# Patient Record
Sex: Male | Born: 1950 | Race: White | Hispanic: No | Marital: Married | Smoking: Never smoker
Health system: Southern US, Community
[De-identification: ages and names within clinical notes are randomized; demographics above are authoritative.]

## PROBLEM LIST (undated history)

## (undated) DIAGNOSIS — J309 Allergic rhinitis, unspecified: Secondary | ICD-10-CM

## (undated) DIAGNOSIS — F32A Depression, unspecified: Secondary | ICD-10-CM

## (undated) DIAGNOSIS — H269 Unspecified cataract: Secondary | ICD-10-CM

## (undated) DIAGNOSIS — F419 Anxiety disorder, unspecified: Secondary | ICD-10-CM

## (undated) DIAGNOSIS — E785 Hyperlipidemia, unspecified: Secondary | ICD-10-CM

## (undated) DIAGNOSIS — I10 Essential (primary) hypertension: Secondary | ICD-10-CM

## (undated) DIAGNOSIS — G709 Myoneural disorder, unspecified: Secondary | ICD-10-CM

## (undated) DIAGNOSIS — K219 Gastro-esophageal reflux disease without esophagitis: Secondary | ICD-10-CM

## (undated) DIAGNOSIS — E119 Type 2 diabetes mellitus without complications: Secondary | ICD-10-CM

## (undated) DIAGNOSIS — T7840XA Allergy, unspecified, initial encounter: Secondary | ICD-10-CM

## (undated) DIAGNOSIS — I739 Peripheral vascular disease, unspecified: Secondary | ICD-10-CM

## (undated) DIAGNOSIS — M1990 Unspecified osteoarthritis, unspecified site: Secondary | ICD-10-CM

## (undated) DIAGNOSIS — J189 Pneumonia, unspecified organism: Secondary | ICD-10-CM

## (undated) DIAGNOSIS — Z8701 Personal history of pneumonia (recurrent): Secondary | ICD-10-CM

## (undated) DIAGNOSIS — F329 Major depressive disorder, single episode, unspecified: Secondary | ICD-10-CM

## (undated) HISTORY — PX: EYE SURGERY: SHX253

## (undated) HISTORY — DX: Allergy, unspecified, initial encounter: T78.40XA

## (undated) HISTORY — PX: CATARACT EXTRACTION: SUR2

## (undated) HISTORY — DX: Depression, unspecified: F32.A

## (undated) HISTORY — DX: Essential (primary) hypertension: I10

## (undated) HISTORY — PX: KNEE SURGERY: SHX244

## (undated) HISTORY — PX: CARPAL TUNNEL RELEASE: SHX101

## (undated) HISTORY — DX: Personal history of pneumonia (recurrent): Z87.01

## (undated) HISTORY — DX: Peripheral vascular disease, unspecified: I73.9

## (undated) HISTORY — DX: Unspecified osteoarthritis, unspecified site: M19.90

## (undated) HISTORY — DX: Allergic rhinitis, unspecified: J30.9

## (undated) HISTORY — DX: Hyperlipidemia, unspecified: E78.5

## (undated) HISTORY — DX: Major depressive disorder, single episode, unspecified: F32.9

## (undated) HISTORY — DX: Gastro-esophageal reflux disease without esophagitis: K21.9

## (undated) HISTORY — DX: Type 2 diabetes mellitus without complications: E11.9

## (undated) HISTORY — DX: Anxiety disorder, unspecified: F41.9

## (undated) HISTORY — DX: Unspecified cataract: H26.9

## (undated) HISTORY — DX: Myoneural disorder, unspecified: G70.9

---

## 2005-03-13 ENCOUNTER — Encounter: Admission: RE | Admit: 2005-03-13 | Discharge: 2005-06-11 | Payer: Self-pay | Admitting: Family Medicine

## 2005-12-05 ENCOUNTER — Ambulatory Visit: Payer: Self-pay | Admitting: Internal Medicine

## 2005-12-27 ENCOUNTER — Ambulatory Visit: Payer: Self-pay | Admitting: Internal Medicine

## 2007-02-10 ENCOUNTER — Ambulatory Visit (HOSPITAL_BASED_OUTPATIENT_CLINIC_OR_DEPARTMENT_OTHER): Admission: RE | Admit: 2007-02-10 | Discharge: 2007-02-10 | Payer: Self-pay | Admitting: Orthopedic Surgery

## 2010-11-14 NOTE — Op Note (Signed)
NAME:  Daniel [name], Daniel [name]                   ACCOUNT NO.:  [id number]   MEDICAL RECORD NO.:  [id number]          PATIENT TYPE:  AMB   LOCATION:  [facility]                         FACILITY:  [facility]   PHYSICIAN:  Ollen Gross, M.D.    DATE OF BIRTH:  [birth year]   DATE OF PROCEDURE:  02/10/2007  DATE OF DISCHARGE:                               OPERATIVE REPORT   PREOPERATIVE DIAGNOSIS:  Right knee medial meniscal tear.   POSTOPERATIVE DIAGNOSIS:  Right knee medial meniscal tear.  Chondral  defect, medial femoral condyle.   OPERATION PERFORMED:  Right knee arthroscopy with meniscal debridement  and chondroplasty.   SURGEON:  Ollen Gross, M.D.   ASSISTANT:  None.   ANESTHESIA:  General.   ESTIMATED BLOOD LOSS:  Minimal.   DRAINS:  None.   COMPLICATIONS:  None.   CONDITION:  Stable to recovery.   INDICATIONS FOR PROCEDURE:  Daniel [name] is a 60 year old male who has  several month history of progressively worsening right knee pain and  mechanical symptoms.  Exam and history suggest a meniscal tear confirmed  by MRI with also associated chondral defect.  Presents now for  arthroscopy and debridement.   DESCRIPTION OF PROCEDURE:  After successful administration of general  anesthetic, a tourniquet was placed high on the right thigh.  Right  lower extremity was prepped and draped in the usual sterile fashion.  Standard superomedial inferolateral incisions were made from the portals  and the inflow cannula passed superomedial, camera passed inferolateral.  Arthroscopic visualization proceeds.  Undersurface of the patella showed  some grade 1 and 2 change as did the trochlea.  There are no full  thickness chondral defects.  Medial and lateral gutters are visualized.  There are no loose bodies.  Flexion valgus force was applied to the knee  and the medial compartment was entered.  There is evidence of chondral  defect coming off the medial femoral condyle as well as a severely  degenerative tear  in the body and posterior horn of the medial meniscus.  Spinal needle was used to localize the inferomedial portal.  A small  incision made, dilator placed and I debrided the meniscus back to a  stable base with baskets and a 4.2 mm shaver and sealed it off with the  arthrocare device.  The meniscus was then found to be stable.  About a 1  x 1 cm area of exposed bone on the medialmost aspect of the medial  femoral condyle.  There was also some high grade chondromalacia just  adjacent to this.  I debrided the unstable cartilage back to a stable  bony base with stable cartilaginous edges.  The entire defect is about 1  x 1 cm.  The rest of the condyle had some low grade chondromalacia.  There is no evidence of any exposed bone on the tibial plateau.  The  intercondylar notch was visualized, the ACL was normal.  The lateral  compartment is entered and it is normal.  We inspected the joint again,  no further tears, defects or loose bodies.  The arthroscopic equipment  was then removed from the inferior portals  which were closed with interrupted 4-0 nylon.  20 mL of 0.25% Marcaine  with epinephrine were injected through the inflow cannula and that was  removed and that was removed and that portal closed with nylon.  Bulky  sterile dressing was applied and he was awakened and transported to  recovery in stable condition.      Ollen Gross, M.D.  Electronically Signed     FA/MEDQ  D:  02/10/2007  T:  02/11/2007  Job:  [id number]

## 2010-11-24 DIAGNOSIS — R809 Proteinuria, unspecified: Secondary | ICD-10-CM | POA: Insufficient documentation

## 2011-04-16 LAB — POCT I-STAT 4, (NA,K, GLUC, HGB,HCT)
Glucose, Bld: 113 — ABNORMAL HIGH
HCT: 46
Hemoglobin: 12.9 — ABNORMAL LOW
Hemoglobin: 15.6
Operator id: 114531
Potassium: 4.3
Potassium: 4.4
Sodium: 137

## 2011-09-13 DIAGNOSIS — J45909 Unspecified asthma, uncomplicated: Secondary | ICD-10-CM | POA: Insufficient documentation

## 2012-02-29 ENCOUNTER — Encounter: Payer: Self-pay | Admitting: *Deleted

## 2012-03-21 ENCOUNTER — Ambulatory Visit: Payer: Self-pay | Admitting: Internal Medicine

## 2012-05-16 ENCOUNTER — Other Ambulatory Visit (INDEPENDENT_AMBULATORY_CARE_PROVIDER_SITE_OTHER): Payer: BC Managed Care – PPO

## 2012-05-16 ENCOUNTER — Ambulatory Visit (INDEPENDENT_AMBULATORY_CARE_PROVIDER_SITE_OTHER): Payer: BC Managed Care – PPO | Admitting: Internal Medicine

## 2012-05-16 ENCOUNTER — Encounter: Payer: Self-pay | Admitting: Internal Medicine

## 2012-05-16 VITALS — BP 120/76 | HR 76 | Ht 66.5 in | Wt 245.2 lb

## 2012-05-16 DIAGNOSIS — R195 Other fecal abnormalities: Secondary | ICD-10-CM

## 2012-05-16 DIAGNOSIS — D509 Iron deficiency anemia, unspecified: Secondary | ICD-10-CM

## 2012-05-16 LAB — CBC WITH DIFFERENTIAL/PLATELET
Basophils Relative: 0.3 % (ref 0.0–3.0)
Eosinophils Relative: 1.5 % (ref 0.0–5.0)
Lymphocytes Relative: 32.6 % (ref 12.0–46.0)
MCV: 92.8 fl (ref 78.0–100.0)
Monocytes Absolute: 1.1 10*3/uL — ABNORMAL HIGH (ref 0.1–1.0)
Monocytes Relative: 10.9 % (ref 3.0–12.0)
Neutrophils Relative %: 54.7 % (ref 43.0–77.0)
Platelets: 328 10*3/uL (ref 150.0–400.0)
RBC: 3.96 Mil/uL — ABNORMAL LOW (ref 4.22–5.81)
WBC: 10.1 10*3/uL (ref 4.5–10.5)

## 2012-05-16 LAB — IBC PANEL
Iron: 75 ug/dL (ref 42–165)
Saturation Ratios: 19.2 % — ABNORMAL LOW (ref 20.0–50.0)
Transferrin: 279.5 mg/dL (ref 212.0–360.0)

## 2012-05-16 MED ORDER — OMEPRAZOLE 20 MG PO CPDR: 20.0000 mg | DELAYED_RELEASE_CAPSULE | Freq: Every day | ORAL | Status: DC

## 2012-05-16 NOTE — Patient Instructions (Addendum)
Your physician has requested that you go to the basement for the following lab work before leaving today: CBC, IBC  We have sent the following medications to your pharmacy for you to pick up at your convenience: Prilosec 20 mg daily.  CC: Dr Herb Grays

## 2012-05-16 NOTE — Progress Notes (Signed)
Daniel [name] Jun 08, 1951 MRN [id number]  History of Present Illness:  This is a 61 year old the white male with one out of three Hemoccult cards positive for blood on  home test. He takes ibuprofen 800 mg once a day. He stopped taking his ibuprofen according to the instructions on the cards. He denies any visible blood per rectum. His last colonoscopy in June 2007 was a normal exam. He has a positive family history of colon polyps. He has occasional heartburn but denies dysphagia or odynophagia. His bowel habits are regular. His last colonoscopy was not covered by his insurance, [employer]. He is currently looking for insurance carrier which would cover his procedures and his medical care, so it may take several months for him to be able to obtain insurance.   Past Medical History  Diagnosis Date  . Diabetes mellitus   . Benign hypertension   . Arthritis   . History of pneumonia   . HLD (hyperlipidemia)   . Allergic rhinitis    Past Surgical History  Procedure Date  . Cataract extraction     bilateral  . Knee surgery     right    reports that he has never smoked. He has never used smokeless tobacco. He reports that he does not drink alcohol or use illicit drugs. family history includes Atrial fibrillation in his mother; Colon polyps in his father; Diabetes in his father; and Prostate cancer in his paternal uncle. Allergies  Allergen Reactions  . Codeine     Palpitations, SOB        Review of Systems: Negative for abdominal pain positive for joint pains negative for melena  The remainder of the 10 point ROS is negative except as outlined in H&P   Physical Exam: General appearance  Well developed, in no distress overweight. Eyes- non icteric. HEENT nontraumatic, normocephalic. Mouth no lesions, tongue papillated, no cheilosis. Neck supple without adenopathy, thyroid not enlarged, no carotid bruits, no JVD. Lungs Clear to auscultation bilaterally. Cor normal S1,  normal S2, regular rhythm, no murmur,  quiet precordium. Abdomen: Obese soft nontender with normoactive bowel sounds. No distention. Liver edge at costal margin. Rectal: Soft Hemoccult negative stool. Extremities trace edema bilaterally. Skin no lesions. Neurological alert and oriented x 3. Psychological normal mood and affect.  Assessment and Plan:  Problem #1 Hemoccult-positive stool in 1 of 3 cards. I was unable to reproduce the positive test on today's exam. He had a colonoscopy 6.5 years ago. He is taking high doses of ibuprofen which may cause gastropathy and microscopic blood loss. We will check his blood count and Iron studies today. I suggest that he has an upper endoscopy and colonoscopy. At the present time, he is looking for an insurance company to cover these procedures and his general medical care. It will be for at least 2 months before he makes a decision. In the meantime he will begin  Prilosec 20 mg daily. I asked him to cut back on his ibuprofen as much as he can. We are also checking his iron studies. We will send him a reminder letter for endoscopy and colonoscopy in January 2014.   05/16/2012 Lina Sar

## 2012-08-01 ENCOUNTER — Encounter: Payer: Self-pay | Admitting: Internal Medicine

## 2012-09-15 DIAGNOSIS — J3081 Allergic rhinitis due to animal (cat) (dog) hair and dander: Secondary | ICD-10-CM | POA: Insufficient documentation

## 2012-12-09 ENCOUNTER — Other Ambulatory Visit: Payer: Self-pay | Admitting: Internal Medicine

## 2013-02-25 ENCOUNTER — Encounter: Payer: Self-pay | Admitting: Internal Medicine

## 2014-02-10 DIAGNOSIS — Z9109 Other allergy status, other than to drugs and biological substances: Secondary | ICD-10-CM | POA: Insufficient documentation

## 2015-02-11 ENCOUNTER — Ambulatory Visit (INDEPENDENT_AMBULATORY_CARE_PROVIDER_SITE_OTHER): Payer: Medicare Other | Admitting: Family Medicine

## 2015-02-11 VITALS — BP 124/68 | HR 84 | Temp 98.2°F | Resp 14 | Ht 68.0 in | Wt 235.0 lb

## 2015-02-11 DIAGNOSIS — E119 Type 2 diabetes mellitus without complications: Secondary | ICD-10-CM | POA: Diagnosis not present

## 2015-02-11 DIAGNOSIS — L03011 Cellulitis of right finger: Secondary | ICD-10-CM

## 2015-02-11 DIAGNOSIS — M79644 Pain in right finger(s): Secondary | ICD-10-CM | POA: Diagnosis not present

## 2015-02-11 DIAGNOSIS — IMO0001 Reserved for inherently not codable concepts without codable children: Secondary | ICD-10-CM

## 2015-02-11 MED ORDER — DOXYCYCLINE HYCLATE 100 MG PO CAPS: 100.0000 mg | ORAL_CAPSULE | Freq: Two times a day (BID) | ORAL | Status: DC

## 2015-02-11 MED ORDER — GLIMEPIRIDE 4 MG PO TABS: 4.0000 mg | ORAL_TABLET | Freq: Two times a day (BID) | ORAL | Status: DC

## 2015-02-11 NOTE — Patient Instructions (Signed)
1.  SOAK FINGER TWICE DAILY IN WARM WATER WITH PEROXIDE FOR 15 MINUTES EACH TIME.  Paronychia Paronychia is an inflammatory reaction involving the folds of the skin surrounding the fingernail. This is commonly caused by an infection in the skin around a nail. The most common cause of paronychia is frequent wetting of the hands (as seen with bartenders, food servers, nurses or others who wet their hands). This makes the skin around the fingernail susceptible to infection by bacteria (germs) or fungus. Other predisposing factors are:  Aggressive manicuring.  Nail biting.  Thumb sucking. The most common cause is a staphylococcal (a type of germ) infection, or a fungal (Candida) infection. When caused by a germ, it usually comes on suddenly with redness, swelling, pus and is often painful. It may get under the nail and form an abscess (collection of pus), or form an abscess around the nail. If the nail itself is infected with a fungus, the treatment is usually prolonged and may require oral medicine for up to one year. Your caregiver will determine the length of time treatment is required. The paronychia caused by bacteria (germs) may largely be avoided by not pulling on hangnails or picking at cuticles. When the infection occurs at the tips of the finger it is called felon. When the cause of paronychia is from the herpes simplex virus (HSV) it is called herpetic whitlow. TREATMENT  When an abscess is present treatment is often incision and drainage. This means that the abscess must be cut open so the pus can get out. When this is done, the following home care instructions should be followed. HOME CARE INSTRUCTIONS   It is important to keep the affected fingers very dry. Rubber or plastic gloves over cotton gloves should be used whenever the hand must be placed in water.  Keep wound clean, dry and dressed as suggested by your caregiver between warm soaks or warm compresses.  Soak in warm water for  fifteen to twenty minutes three to four times per day for bacterial infections. Fungal infections are very difficult to treat, so often require treatment for long periods of time.  For bacterial (germ) infections take antibiotics (medicine which kill germs) as directed and finish the prescription, even if the problem appears to be solved before the medicine is gone.  Only take over-the-counter or prescription medicines for pain, discomfort, or fever as directed by your caregiver. SEEK IMMEDIATE MEDICAL CARE IF:  You have redness, swelling, or increasing pain in the wound.  You notice pus coming from the wound.  You have a fever.  You notice a bad smell coming from the wound or dressing. Document Released: 12/12/2000 Document Revised: 09/10/2011 Document Reviewed: 08/13/2008 [facility] Patient Information 2015 [place], [place]. This information is not intended to replace advice given to you by your health care provider. Make sure you discuss any questions you have with your health care provider.

## 2015-02-11 NOTE — Progress Notes (Signed)
Subjective:    Patient ID: Daniel [name], male    DOB: Jul 03, 1950, 64 y.o.   MRN: [id number]  02/11/2015  Hand Pain   HPI This 64 y.o. male presents for evaluation of R hand pain.  Does tile work; usually wears gloves but did not for two days.  Five days ago, notied painful fingers on R.  Very painful. Unable to touch anything. Had to do everything with L hand on Tuesday.  On Wednesday, 40 pound  Bucket hit R third finger; severe pain.  Fingers are swollen.  Every three or four hours it is worse.  No fever/chills/sweats.  Skin is red.  Painful ROM of R third digit.  Distal finger is extremely tender.  Tried to clean under cuticle.  Tetanus vaccine unsure.  Sugar 260 this morning.  Dr. Modena Morrow is PCP.  [facility].   Review of Systems  Constitutional: Negative for fever, chills, diaphoresis and fatigue.  Endocrine: Negative for cold intolerance, heat intolerance, polydipsia, polyphagia and polyuria.  Musculoskeletal: Positive for joint swelling and arthralgias.  Skin: Positive for color change and wound. Negative for pallor and rash.  Neurological: Negative for weakness and numbness.    Past Medical History  Diagnosis Date  . Diabetes mellitus   . Benign hypertension   . Arthritis   . History of pneumonia   . HLD (hyperlipidemia)   . Allergic rhinitis   . Allergy    Past Surgical History  Procedure Laterality Date  . Cataract extraction      bilateral  . Knee surgery      right  . Eye surgery     Allergies  Allergen Reactions  . Codeine     Palpitations, SOB   Social History   Social History  . Marital Status: Married    Spouse Name: N/A  . Number of Children: 3  . Years of Education: N/A   Occupational History  . tile Chief Strategy Officer, Company secretary    Social History Main Topics  . Smoking status: Never Smoker   . Smokeless tobacco: Never Used  . Alcohol Use: No  . Drug Use: No  . Sexual Activity: Not on file   Other Topics Concern  . Not on file    Social History Narrative        Objective:    BP 124/68 mmHg  Pulse 84  Temp(Src) 98.2 F (36.8 C) (Oral)  Resp 14  Ht 5\' 8"  (1.727 m)  Wt 235 lb (106.595 kg)  BMI 35.74 kg/m2  SpO2 98% Physical Exam  Constitutional: He is oriented to person, place, and time. He appears well-developed and well-nourished. No distress.  HENT:  Head: Normocephalic and atraumatic.  Eyes: Conjunctivae and EOM are normal. Pupils are equal, round, and reactive to light.  Neck: Normal range of motion. Neck supple. Carotid bruit is not present. No thyromegaly present.  Cardiovascular: Normal rate, regular rhythm, normal heart sounds and intact distal pulses.  Exam reveals no gallop and no friction rub.   No murmur heard. Pulmonary/Chest: Effort normal and breath sounds normal. He has no wheezes. He has no rales.  Musculoskeletal:  R THIRD FINGER: no joint swelling at DIP or PIP; +distal finger swelling;no fatpad involvement.  Lymphadenopathy:    He has no cervical adenopathy.  Neurological: He is alert and oriented to person, place, and time. No cranial nerve deficit.  Skin: Skin is warm and dry. No rash noted. He is not diaphoretic. There is erythema.  R third distal finger with  swelling along lateral nail bed with fluctuance along lateral nailbed as well; +TTP.  No fatpad involvement of R distal finger.  Painful ROM of distal third finger; no streaking.  Psychiatric: He has a normal mood and affect. His behavior is normal.  Nursing note and vitals reviewed.   PROCEDURE: VERBAL CONSENT OBTAINED; STERILE 11 BLADE ADVANCED INTO AREA OF FLUCTUANCE ALONG DISTAL R THIRD FINGER ALONG LATERAL NAILBED; MODERATE AMOUNT OF WHITE DRAINAGE EXPRESSED; WOUND CULTURE OBTAINED; GOOD HEMOSTASIS; PT TOLERATED WELL.    Assessment & Plan:   1. Pain of right middle finger   2. Paronychia of third finger, right   3. Type 2 diabetes mellitus without complication    Orders Placed This Encounter  Procedures  . Wound  culture    1. PAIN OF R THIRD FINGER: New. Secondary to paronychia.   2.  Paronychia R third finger: New. S/p I&D; wound culture obtained; rx for Doxycycline provided.  RTC for worsening redness, pain, swelling. 3. DMII: uncontrolled; monitor sugars closely with acute infection.   Meds ordered this encounter  Medications  . cetirizine (ZYRTEC) 10 MG tablet    Sig: Take 10 mg by mouth daily.  . traMADol (ULTRAM) 50 MG tablet    Sig: Take 50 mg by mouth every 6 (six) hours as needed.  Marland Kitchen DISCONTD: doxycycline (VIBRAMYCIN) 100 MG capsule    Sig: Take 1 capsule (100 mg total) by mouth 2 (two) times daily.    Dispense:  20 capsule    Refill:  0  . DISCONTD: glimepiride (AMARYL) 4 MG tablet    Sig: Take 1 tablet (4 mg total) by mouth 2 (two) times daily.    Dispense:  60 tablet    Refill:  3  . glimepiride (AMARYL) 4 MG tablet    Sig: Take 1 tablet (4 mg total) by mouth 2 (two) times daily.    Dispense:  60 tablet    Refill:  3  . doxycycline (VIBRAMYCIN) 100 MG capsule    Sig: Take 1 capsule (100 mg total) by mouth 2 (two) times daily.    Dispense:  20 capsule    Refill:  0    No Follow-up on file.   Kenae Lindquist Elayne Guerin, M.D. [facility] [address] [place], [place]  [address] [phone] phone [phone] fax

## 2015-02-12 ENCOUNTER — Telehealth: Payer: Self-pay

## 2015-02-12 MED ORDER — GLIMEPIRIDE 4 MG PO TABS
4.0000 mg | ORAL_TABLET | Freq: Two times a day (BID) | ORAL | Status: DC
Start: 2015-02-12 — End: 2015-03-16

## 2015-02-12 MED ORDER — DOXYCYCLINE HYCLATE 100 MG PO CAPS: 100.0000 mg | ORAL_CAPSULE | Freq: Two times a day (BID) | ORAL | Status: DC

## 2015-02-12 NOTE — Telephone Encounter (Signed)
rx's rensent to pharmacy.  Pt notified by Larene Beach.

## 2015-02-12 NOTE — Telephone Encounter (Signed)
Pt was seen yesterday and the rx's have not  Reached the [facility] [address] [facility]   Best number [phone]

## 2015-02-14 LAB — WOUND CULTURE
GRAM STAIN: NONE SEEN
Gram Stain: NONE SEEN

## 2015-03-16 ENCOUNTER — Other Ambulatory Visit: Payer: Self-pay

## 2015-03-16 MED ORDER — GLIMEPIRIDE 4 MG PO TABS: 4.0000 mg | ORAL_TABLET | Freq: Two times a day (BID) | ORAL | Status: DC

## 2015-04-04 ENCOUNTER — Telehealth: Payer: Self-pay

## 2015-04-04 MED ORDER — METFORMIN HCL 1000 MG PO TABS: 1000.0000 mg | ORAL_TABLET | Freq: Two times a day (BID) | ORAL | Status: DC

## 2015-04-04 NOTE — Telephone Encounter (Signed)
Pt is needing to get a refill on metformin before the first of nov when he he has an appt with dr Tamala Julian

## 2015-04-04 NOTE — Telephone Encounter (Signed)
Has glimepiride and only needs metformin. Has not run out of medication. Has appt to see Dr. Tamala Julian 05/04/15. Will send 1 month refill.

## 2015-04-04 NOTE — Telephone Encounter (Signed)
Can we send in refill? 

## 2015-05-03 ENCOUNTER — Encounter: Payer: Self-pay | Admitting: Family Medicine

## 2015-05-03 ENCOUNTER — Ambulatory Visit (INDEPENDENT_AMBULATORY_CARE_PROVIDER_SITE_OTHER): Payer: Medicare Other | Admitting: Internal Medicine

## 2015-05-03 VITALS — BP 161/78 | HR 70 | Temp 98.2°F | Resp 16 | Ht 66.5 in | Wt 239.6 lb

## 2015-05-03 DIAGNOSIS — E119 Type 2 diabetes mellitus without complications: Secondary | ICD-10-CM

## 2015-05-03 DIAGNOSIS — I10 Essential (primary) hypertension: Secondary | ICD-10-CM

## 2015-05-03 DIAGNOSIS — Z0000 Encounter for general adult medical examination without abnormal findings: Secondary | ICD-10-CM

## 2015-05-03 DIAGNOSIS — E669 Obesity, unspecified: Secondary | ICD-10-CM

## 2015-05-03 DIAGNOSIS — E1169 Type 2 diabetes mellitus with other specified complication: Secondary | ICD-10-CM

## 2015-05-03 DIAGNOSIS — E7800 Pure hypercholesterolemia, unspecified: Secondary | ICD-10-CM

## 2015-05-03 DIAGNOSIS — Z23 Encounter for immunization: Secondary | ICD-10-CM | POA: Diagnosis not present

## 2015-05-03 DIAGNOSIS — R002 Palpitations: Secondary | ICD-10-CM | POA: Diagnosis not present

## 2015-05-03 LAB — POCT URINALYSIS DIP (MANUAL ENTRY)
Blood, UA: NEGATIVE
Glucose, UA: NEGATIVE
LEUKOCYTES UA: NEGATIVE
Nitrite, UA: NEGATIVE
PH UA: 5.5
Protein Ur, POC: 30 — AB
Spec Grav, UA: >=1.03
Urobilinogen, UA: 0.2

## 2015-05-03 LAB — CBC
HCT: 34.4 % — ABNORMAL LOW (ref 39.0–52.0)
HEMOGLOBIN: 11.5 g/dL — AB (ref 13.0–17.0)
MCH: 29.9 pg (ref 26.0–34.0)
MCHC: 33.4 g/dL (ref 30.0–36.0)
MCV: 89.6 fL (ref 78.0–100.0)
MPV: 10.2 fL (ref 8.6–12.4)
Platelets: 351 10*3/uL (ref 150–400)
RBC: 3.84 MIL/uL — AB (ref 4.22–5.81)
RDW: 13.4 % (ref 11.5–15.5)
WBC: 8 10*3/uL (ref 4.0–10.5)

## 2015-05-03 LAB — LIPID PANEL
CHOL/HDL RATIO: 3.4 ratio (ref <=5.0)
CHOLESTEROL: 200 mg/dL (ref 125–200)
HDL: 58 mg/dL (ref >=40)
LDL Cholesterol: 119 mg/dL (ref <130)
TRIGLYCERIDES: 115 mg/dL (ref <150)
VLDL: 23 mg/dL (ref <30)

## 2015-05-03 LAB — TSH: TSH: 2.699 u[IU]/mL (ref 0.350–4.500)

## 2015-05-03 LAB — HEMOGLOBIN A1C
Hgb A1c MFr Bld: 9 % — ABNORMAL HIGH (ref <5.7)
Mean Plasma Glucose: 212 mg/dL — ABNORMAL HIGH (ref <117)

## 2015-05-03 LAB — COMPREHENSIVE METABOLIC PANEL
ALBUMIN: 3.9 g/dL (ref 3.6–5.1)
ALT: 23 U/L (ref 9–46)
AST: 17 U/L (ref 10–35)
Alkaline Phosphatase: 63 U/L (ref 40–115)
BUN: 18 mg/dL (ref 7–25)
CHLORIDE: 103 mmol/L (ref 98–110)
CO2: 26 mmol/L (ref 20–31)
Calcium: 9.6 mg/dL (ref 8.6–10.3)
Creat: 0.94 mg/dL (ref 0.70–1.25)
Glucose, Bld: 152 mg/dL — ABNORMAL HIGH (ref 65–99)
POTASSIUM: 4.5 mmol/L (ref 3.5–5.3)
Sodium: 140 mmol/L (ref 135–146)
TOTAL PROTEIN: 6.4 g/dL (ref 6.1–8.1)
Total Bilirubin: 0.4 mg/dL (ref 0.2–1.2)

## 2015-05-03 MED ORDER — GLIMEPIRIDE 4 MG PO TABS: 4.0000 mg | ORAL_TABLET | Freq: Two times a day (BID) | ORAL | Status: DC

## 2015-05-03 MED ORDER — METFORMIN HCL 1000 MG PO TABS: 1000.0000 mg | ORAL_TABLET | Freq: Two times a day (BID) | ORAL | Status: DC

## 2015-05-03 MED ORDER — LISINOPRIL 20 MG PO TABS: 20.0000 mg | ORAL_TABLET | Freq: Every day | ORAL | Status: DC

## 2015-05-03 MED ORDER — PRAVASTATIN SODIUM 40 MG PO TABS: 40.0000 mg | ORAL_TABLET | Freq: Every day | ORAL | Status: DC

## 2015-05-03 NOTE — Progress Notes (Signed)
Subjective:    Patient ID: Daniel [name], male    DOB: 1950/09/03, 63 y.o.   MRN: [id number]  HPI This is a pleasant 64 yo male who presents today for follow up of HTN, DM2, hypercholesterolemia.  He was previously seen by Dr. Modena Morrow for primary care. He was out of his metformin for about 45 days during the last several months.   Blood sugar 140s- fasting, mid 150s post prandial.   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 Lasted 15-20 minutes, resolved with rest. Thinks these episodes were related to being overly tired and possibly dehydration, no associated chest pain, no dizziness. Last episode several weeks ago.   Review of Systems  Constitutional: Positive for fatigue (worse over last 6 months, works 48 hours most weeks).  HENT: Positive for dental problem (needs some dental work) and sinus pressure.   Eyes: Negative.   Respiratory: Negative.   Cardiovascular: Positive for palpitations. Leg swelling: occasional.  Endocrine: Positive for polyuria.  Genitourinary: Positive for frequency (for years).  Musculoskeletal: Positive for joint swelling and arthralgias (left knee, chronic, sees Dr. Maureen Ralphs).       Knee pain   Skin: Positive for color change.  Allergic/Immunologic: Positive for environmental allergies (takes zyrtec, nasocourt).  Neurological: Positive for numbness (left hand, has gotten better).  Hematological: Negative.   Psychiatric/Behavioral: Positive for sleep disturbance.      Objective:   Physical Exam Physical Exam  Constitutional: He is oriented to person, place, and time. He appears well-developed and well-nourished. Obese.  HENT:  Head: Normocephalic and atraumatic.  Right Ear: External ear normal.    Left Ear: External ear normal.  Nose: Nose normal.  Mouth/Throat: Oropharynx is clear and moist.  Eyes: Conjunctivae are normal. Pupils are equal, round, and reactive to light.  Neck: Normal range of motion. Neck supple.  Cardiovascular: Normal rate, regular rhythm, normal heart sounds and intact distal pulses.   Pulmonary/Chest: Effort normal and breath sounds normal.  Abdominal: Soft. Bowel sounds are normal. Hernia confirmed negative in the right inguinal area and confirmed negative in the left inguinal area.  Genitourinary: Testes normal and penis normal. Circumcised.  Musculoskeletal: Normal range of motion. He exhibits no edema or tenderness.       Cervical back: Normal.       Thoracic back: Normal.       Lumbar back: Normal.  Lymphadenopathy:    He has no cervical adenopathy.       Right: No inguinal adenopathy present.       Left: No inguinal adenopathy present.  Neurological: He is alert and oriented to person, place, and time. He has normal reflexes.  Skin: Skin is warm and dry.  Psychiatric: He has a normal mood and affect. His behavior is normal. Judgment normal.  Vitals reviewed.  BP 161/78 mmHg  Pulse 70  Temp(Src) 98.2 F (36.8 C) (Oral)  Resp 16  Ht 5' 6.5" (1.689 m)  Wt 239 lb 9.6 oz (108.682 kg)  BMI 38.10 kg/m2  SpO2 97% Wt Readings from Last 3 Encounters:  05/03/15 239 lb 9.6 oz (108.682 kg)  02/11/15 235 lb (106.595 kg)  05/16/12 245 lb 4 oz (111.245 kg)  EKG- reviewed with Dr. Laney Pastor- occasional PAC,  Questionable  old anteroseptal infarct.     Assessment & Plan:  1. Annual physical exam  2. Intermittent palpitations - EKG 12-Lead - Ambulatory referral to Cardiology  3. Need for influenza vaccination - Flu Vaccine QUAD 36+ mos IM  4. Diabetes mellitus type 2 in obese (HCC) - glimepiride (AMARYL) 4 MG tablet; Take 1 tablet (4 mg total) by mouth 2 (two) times daily.  Dispense: 180 tablet; Refill: 0 - metFORMIN (GLUCOPHAGE) 1000 MG tablet; Take  1 tablet (1,000 mg total) by mouth 2 (two) times daily with a meal.  Dispense: 180 tablet; Refill: 0 - CBC - Comprehensive metabolic panel - Microalbumin, urine - Hemoglobin A1c - Ambulatory referral to Cardiology - patient has not been consistently taking meds over last several months, discussed importance of medication use, careful dietary intake and exercise. Patient has recently gotten some dietary information from a diabetes educator and is making changes, motivation appears high - if no improvement at follow up, suggest endocrine referral  5. Essential hypertension - lisinopril (PRINIVIL,ZESTRIL) 20 MG tablet; Take 1 tablet (20 mg total) by mouth daily.  Dispense: 90 tablet; Refill: 0 - POCT urinalysis dipstick - CBC - Comprehensive metabolic panel - TSH - Microalbumin, urine - Ambulatory referral to Cardiology  6. Hypercholesteremia - pravastatin (PRAVACHOL) 40 MG tablet; Take 1 tablet (40 mg total) by mouth daily.  Dispense: 90 tablet; Refill: 0 - Comprehensive metabolic panel - Lipid panel - Ambulatory referral to Cardiology   Clarene Reamer, FNP-BC  [facility]  05/03/2015 5:06 PM I have participated in the care of this patient with the Advanced Practice Provider and agree with Diagnosis and Plan as documented. Robert P. Laney Pastor, M.D.

## 2015-05-03 NOTE — Patient Instructions (Addendum)
[facility]- please call and make an appointment     Why follow it? Research shows. . Those who follow the Mediterranean diet have a reduced risk of heart disease  . The diet is associated with a reduced incidence of Parkinson's and Alzheimer's diseases . People following the diet may have longer life expectancies and lower rates of chronic diseases  . The Dietary Guidelines for Americans recommends the Mediterranean diet as an eating plan to promote health and prevent disease  What Is the Mediterranean Diet?  . Healthy eating plan based on typical foods and recipes of Mediterranean-style cooking . The diet is primarily a plant based diet; these foods should make up a majority of meals   Starches - Plant based foods should make up a majority of meals - They are an important sources of vitamins, minerals, energy, antioxidants, and fiber - Choose whole grains, foods high in fiber and minimally processed items  - Typical grain sources include wheat, oats, barley, corn, brown rice, bulgar, farro, millet, polenta, couscous  - Various types of beans include chickpeas, lentils, fava beans, black beans, white beans   Fruits  Veggies - Large quantities of antioxidant rich fruits & veggies; 6 or more servings  - Vegetables can be eaten raw or lightly drizzled with oil and cooked  - Vegetables common to the traditional Mediterranean Diet include: artichokes, arugula, beets, broccoli, brussel sprouts, cabbage, carrots, celery, collard greens, cucumbers, eggplant, kale, leeks, lemons, lettuce, mushrooms, okra, onions, peas, peppers, potatoes, pumpkin, radishes, rutabaga, shallots, spinach, sweet potatoes, turnips, zucchini - Fruits common to the Mediterranean Diet include: apples, apricots, avocados, cherries, clementines, dates, figs, grapefruits, grapes, melons, nectarines, oranges, peaches, pears, pomegranates, strawberries, tangerines  Fats - Replace butter and margarine with healthy oils, such as  olive oil, canola oil, and tahini  - Limit nuts to no more than a handful a day  - Nuts include walnuts, almonds, pecans, pistachios, pine nuts  - Limit or avoid candied, honey roasted or heavily salted nuts - Olives are central to the [employer] - can be eaten whole or used in a variety of dishes   Meats Protein - Limiting red meat: no more than a few times a month - When eating red meat: choose lean cuts and keep the portion to the size of deck of cards - Eggs: approx. 0 to 4 times a week  - Fish and lean poultry: at least 2 a week  - Healthy protein sources include, chicken, [place], lean beef, lamb - Increase intake of seafood such as tuna, salmon, trout, mackerel, shrimp, scallops - Avoid or limit high fat processed meats such as sausage and bacon  Dairy - Include moderate amounts of low fat dairy products  - Focus on healthy dairy such as fat free yogurt, skim milk, low or reduced fat cheese - Limit dairy products higher in fat such as whole or 2% milk, cheese, ice cream  Alcohol - Moderate amounts of red wine is ok  - No more than 5 oz daily for women (all ages) and men older than age 22  - No more than 10 oz of wine daily for men younger than 53  Other - Limit sweets and other desserts  - Use herbs and spices instead of salt to flavor foods  - Herbs and spices common to the traditional Mediterranean Diet include: basil, bay leaves, chives, cloves, cumin, fennel, garlic, lavender, marjoram, mint, oregano, parsley, pepper, rosemary, sage, savory, sumac, tarragon, thyme   It's not  just a diet, it's a lifestyle:  . The Mediterranean diet includes lifestyle factors typical of those in the region  . Foods, drinks and meals are best eaten with others and savored . Daily physical activity is important for overall good health . This could be strenuous exercise like running and aerobics . This could also be more leisurely activities such as walking, housework, yard-work, or taking the  stairs . Moderation is the key; a balanced and healthy diet accommodates most foods and drinks . Consider portion sizes and frequency of consumption of certain foods   Meal Ideas & Options:  . Breakfast:  o Whole wheat toast or whole wheat English muffins with peanut butter & hard boiled egg o Steel cut oats topped with apples & cinnamon and skim milk  o Fresh fruit: banana, strawberries, melon, berries, peaches  o Smoothies: strawberries, bananas, greek yogurt, peanut butter o Low fat greek yogurt with blueberries and granola  o Egg white omelet with spinach and mushrooms o Breakfast couscous: whole wheat couscous, apricots, skim milk, cranberries  . Sandwiches:  o Hummus and grilled vegetables (peppers, zucchini, squash) on whole wheat bread   o Grilled chicken on whole wheat pita with lettuce, tomatoes, cucumbers or tzatziki  o Tuna salad on whole wheat bread: tuna salad made with greek yogurt, olives, red peppers, capers, green onions o Garlic rosemary lamb pita: lamb sauted with garlic, rosemary, salt & pepper; add lettuce, cucumber, greek yogurt to pita - flavor with lemon juice and black pepper  . Seafood:  o Mediterranean grilled salmon, seasoned with garlic, basil, parsley, lemon juice and black pepper o Shrimp, lemon, and spinach whole-grain pasta salad made with low fat greek yogurt  o Seared scallops with lemon orzo  o Seared tuna steaks seasoned salt, pepper, coriander topped with tomato mixture of olives, tomatoes, olive oil, minced garlic, parsley, green onions and cappers  . Meats:  o Herbed greek chicken salad with kalamata olives, cucumber, feta  o Red bell peppers stuffed with spinach, bulgur, lean ground beef (or lentils) & topped with feta   o Kebabs: skewers of chicken, tomatoes, onions, zucchini, squash  o [place] burgers: made with red onions, mint, dill, lemon juice, feta cheese topped with roasted red peppers . Vegetarian o Cucumber salad: cucumbers, artichoke  hearts, celery, red onion, feta cheese, tossed in olive oil & lemon juice  o Hummus and whole grain pita points with a greek salad (lettuce, tomato, feta, olives, cucumbers, red onion) o Lentil soup with celery, carrots made with vegetable broth, garlic, salt and pepper  o Tabouli salad: parsley, bulgur, mint, scallions, cucumbers, tomato, radishes, lemon juice, olive oil, salt and pepper.

## 2015-05-04 LAB — MICROALBUMIN, URINE: Microalb, Ur: 15.1 mg/dL

## 2015-05-07 ENCOUNTER — Other Ambulatory Visit: Payer: Self-pay | Admitting: Family Medicine

## 2015-05-07 DIAGNOSIS — D649 Anemia, unspecified: Secondary | ICD-10-CM

## 2015-06-13 ENCOUNTER — Encounter: Payer: Self-pay | Admitting: Internal Medicine

## 2015-06-13 ENCOUNTER — Ambulatory Visit (INDEPENDENT_AMBULATORY_CARE_PROVIDER_SITE_OTHER): Payer: Medicare Other | Admitting: Internal Medicine

## 2015-06-13 VITALS — BP 126/58 | HR 73 | Ht 66.5 in | Wt 242.8 lb

## 2015-06-13 DIAGNOSIS — I10 Essential (primary) hypertension: Secondary | ICD-10-CM

## 2015-06-13 DIAGNOSIS — E785 Hyperlipidemia, unspecified: Secondary | ICD-10-CM

## 2015-06-13 NOTE — Progress Notes (Signed)
Cardiology Office Note   Date:  06/13/2015   ID:  Daniel [name], DOB 1950/09/17, MRN [id number]  PCP:  Reginia Forts, MD  Cardiologist:   Dorris Carnes, MD   F/U of CAD    History of Present Illness: Daniel [name] is a 64 y.o. male with a history of intermitt palpitations, HTN, DM and HL  Pt says through the years has   2 worst times were when glu was very low  Dehydrated Has had a few times this year not associated with glu drop   Years ago told due to not enough rest, caffeine.   This year has been difficult work wise.  Family stress Had a couple spells of heart racing  Last usually 2 to 27min  One time with low glucose (30) was dizzy     Breathing is OK  No CP    Dx with DM in 2006 Pravastatin 8 year     Does demolition work  Reframes bathrooms.     Current Outpatient Prescriptions  Medication Sig Dispense Refill  . albuterol (PROVENTIL HFA;VENTOLIN HFA) 108 (90 BASE) MCG/ACT inhaler Inhale 2 puffs into the lungs every 6 (six) hours as needed.    Marland Kitchen aspirin 81 MG tablet Take 81 mg by mouth daily.    . cetirizine (ZYRTEC) 10 MG tablet Take 10 mg by mouth daily.    Marland Kitchen glimepiride (AMARYL) 4 MG tablet Take 1 tablet (4 mg total) by mouth 2 (two) times daily. 180 tablet 0  . ibuprofen (ADVIL,MOTRIN) 200 MG tablet Take 800 mg by mouth every morning.    Marland Kitchen lisinopril (PRINIVIL,ZESTRIL) 20 MG tablet Take 1 tablet (20 mg total) by mouth daily. 90 tablet 0  . loperamide (IMODIUM A-D) 2 MG tablet Take 2 mg by mouth as needed.    . metFORMIN (GLUCOPHAGE) 1000 MG tablet Take 1 tablet (1,000 mg total) by mouth 2 (two) times daily with a meal. 180 tablet 0  . omeprazole (PRILOSEC) 20 MG capsule TAKE 1 CAPSULE (20 MG TOTAL) BY MOUTH DAILY. 30 capsule 5  . pravastatin (PRAVACHOL) 40 MG tablet Take 1 tablet (40 mg total) by mouth daily. 90 tablet 0  . traMADol (ULTRAM) 50 MG tablet Take 50 mg by mouth every 6 (six) hours as needed.     No current facility-administered medications for this  visit.    Allergies:   Actos and Codeine   Past Medical History  Diagnosis Date  . Diabetes mellitus ([place])   . Benign hypertension   . Arthritis   . History of pneumonia   . HLD (hyperlipidemia)   . Allergic rhinitis   . Allergy     Past Surgical History  Procedure Laterality Date  . Cataract extraction      bilateral  . Knee surgery      right  . Eye surgery       Social History:  The patient  reports that he has never smoked. He has never used smokeless tobacco. He reports that he does not drink alcohol or use illicit drugs.   Family History:  The patient's family history includes Atrial fibrillation in his mother; Colon polyps in his father; Diabetes in his father; Prostate cancer in his paternal uncle.    ROS:  Please see the history of present illness. All other systems are reviewed and  Negative to the above problem except as noted.    PHYSICAL EXAM: VS:  BP 126/58 mmHg  Pulse 73  Ht 5' 6.5" (1.689  m)  Wt 110.133 kg (242 lb 12.8 oz)  BMI 38.61 kg/m2  SpO2 98%  GEN: Well nourished, well developed, in no acute distress HEENT: normal Neck: no JVD, carotid bruits, or masses Cardiac: RRR; no murmurs, rubs, or gallops,no edema  Respiratory:  clear to auscultation bilaterally, normal work of breathing GI: soft, nontender, nondistended, + BS  No hepatomegaly  MS: no deformity Moving all extremities   Skin: warm and dry, no rash Neuro:  Strength and sensation are intact Psych: euthymic mood, full affect   EKG:  EKG is ordered today.  NSR 62  bpm   Lipid Panel    Component Value Date/Time   CHOL 200 05/03/2015 0947   TRIG 115 05/03/2015 0947   HDL 58 05/03/2015 0947   CHOLHDL 3.4 05/03/2015 0947   VLDL 23 05/03/2015 0947   LDLCALC 119 05/03/2015 0947      Wt Readings from Last 3 Encounters:  06/13/15 110.133 kg (242 lb 12.8 oz)  05/03/15 108.682 kg (239 lb 9.6 oz)  02/11/15 106.595 kg (235 lb)      ASSESSMENT AND PLAN:  1  Palpitations   Infrequent    2x per year now.  no real dizziness REcomm  Follow  Hydrate.  Watch caffeine   2.  HL  Last lipids LDL was 119  Will get labs in a couples months  Had not been on pravastatin for a while prior  Back on  Due to get labs  Will make sure they come back to me  Goal of LDL less than 100  F/U in 1 year   Signed, Dorris Carnes, MD  06/13/2015 11:23 AM    [address], [place],   [address] Phone: [phone]; Fax: [phone]

## 2015-07-25 ENCOUNTER — Ambulatory Visit (INDEPENDENT_AMBULATORY_CARE_PROVIDER_SITE_OTHER): Payer: Medicare Other | Admitting: Family Medicine

## 2015-07-25 ENCOUNTER — Encounter: Payer: Self-pay | Admitting: Family Medicine

## 2015-07-25 VITALS — BP 135/77 | HR 71 | Temp 97.8°F | Resp 16 | Ht 66.75 in | Wt 240.8 lb

## 2015-07-25 DIAGNOSIS — E669 Obesity, unspecified: Secondary | ICD-10-CM

## 2015-07-25 DIAGNOSIS — E1169 Type 2 diabetes mellitus with other specified complication: Secondary | ICD-10-CM

## 2015-07-25 DIAGNOSIS — I10 Essential (primary) hypertension: Secondary | ICD-10-CM | POA: Diagnosis not present

## 2015-07-25 DIAGNOSIS — E785 Hyperlipidemia, unspecified: Secondary | ICD-10-CM | POA: Diagnosis not present

## 2015-07-25 DIAGNOSIS — E1142 Type 2 diabetes mellitus with diabetic polyneuropathy: Secondary | ICD-10-CM | POA: Insufficient documentation

## 2015-07-25 DIAGNOSIS — E7800 Pure hypercholesterolemia, unspecified: Secondary | ICD-10-CM

## 2015-07-25 DIAGNOSIS — R350 Frequency of micturition: Secondary | ICD-10-CM | POA: Diagnosis not present

## 2015-07-25 DIAGNOSIS — M1712 Unilateral primary osteoarthritis, left knee: Secondary | ICD-10-CM | POA: Diagnosis not present

## 2015-07-25 DIAGNOSIS — Z1159 Encounter for screening for other viral diseases: Secondary | ICD-10-CM | POA: Diagnosis not present

## 2015-07-25 DIAGNOSIS — E119 Type 2 diabetes mellitus without complications: Secondary | ICD-10-CM

## 2015-07-25 DIAGNOSIS — D649 Anemia, unspecified: Secondary | ICD-10-CM

## 2015-07-25 LAB — HEPATITIS C ANTIBODY: HCV Ab: NEGATIVE

## 2015-07-25 LAB — GLUCOSE, POCT (MANUAL RESULT ENTRY): POC Glucose: 165 mg/dl — AB (ref 70–99)

## 2015-07-25 LAB — CBC WITH DIFFERENTIAL/PLATELET
Basophils Absolute: 0 10*3/uL (ref 0.0–0.1)
Basophils Relative: 0 % (ref 0–1)
EOS ABS: 0.2 10*3/uL (ref 0.0–0.7)
EOS PCT: 2 % (ref 0–5)
HEMATOCRIT: 38.6 % — AB (ref 39.0–52.0)
Hemoglobin: 13.4 g/dL (ref 13.0–17.0)
LYMPHS ABS: 3.2 10*3/uL (ref 0.7–4.0)
Lymphocytes Relative: 31 % (ref 12–46)
MCH: 31.1 pg (ref 26.0–34.0)
MCHC: 34.7 g/dL (ref 30.0–36.0)
MCV: 89.6 fL (ref 78.0–100.0)
MONOS PCT: 8 % (ref 3–12)
MPV: 10.1 fL (ref 8.6–12.4)
Monocytes Absolute: 0.8 10*3/uL (ref 0.1–1.0)
Neutro Abs: 6.1 10*3/uL (ref 1.7–7.7)
Neutrophils Relative %: 59 % (ref 43–77)
PLATELETS: 351 10*3/uL (ref 150–400)
RBC: 4.31 MIL/uL (ref 4.22–5.81)
RDW: 12.9 % (ref 11.5–15.5)
WBC: 10.3 10*3/uL (ref 4.0–10.5)

## 2015-07-25 LAB — COMPREHENSIVE METABOLIC PANEL
ALT: 31 U/L (ref 9–46)
AST: 22 U/L (ref 10–35)
Albumin: 4.3 g/dL (ref 3.6–5.1)
Alkaline Phosphatase: 68 U/L (ref 40–115)
BUN: 24 mg/dL (ref 7–25)
CALCIUM: 9.5 mg/dL (ref 8.6–10.3)
CHLORIDE: 98 mmol/L (ref 98–110)
CO2: 26 mmol/L (ref 20–31)
Creat: 0.95 mg/dL (ref 0.70–1.25)
GLUCOSE: 159 mg/dL — AB (ref 65–99)
POTASSIUM: 4.4 mmol/L (ref 3.5–5.3)
Sodium: 138 mmol/L (ref 135–146)
Total Bilirubin: 0.4 mg/dL (ref 0.2–1.2)
Total Protein: 6.7 g/dL (ref 6.1–8.1)

## 2015-07-25 LAB — VITAMIN B12: VITAMIN B 12: 593 pg/mL (ref 211–911)

## 2015-07-25 LAB — LIPID PANEL
CHOL/HDL RATIO: 2.6 ratio (ref <=5.0)
CHOLESTEROL: 157 mg/dL (ref 125–200)
HDL: 60 mg/dL (ref >=40)
LDL Cholesterol: 70 mg/dL (ref <130)
Triglycerides: 137 mg/dL (ref <150)
VLDL: 27 mg/dL (ref <30)

## 2015-07-25 LAB — IBC PANEL
%SAT: 26 % (ref 15–60)
TIBC: 358 ug/dL (ref 250–425)
UIBC: 266 ug/dL (ref 125–400)

## 2015-07-25 LAB — POCT GLYCOSYLATED HEMOGLOBIN (HGB A1C): HEMOGLOBIN A1C: 9.5

## 2015-07-25 LAB — PSA: PSA: 0.21 ng/mL (ref <=4.00)

## 2015-07-25 LAB — IRON: Iron: 92 ug/dL (ref 50–180)

## 2015-07-25 MED ORDER — METFORMIN HCL 1000 MG PO TABS: 1000.0000 mg | ORAL_TABLET | Freq: Two times a day (BID) | ORAL | Status: DC

## 2015-07-25 MED ORDER — GLUCOSE BLOOD VI STRP: ORAL_STRIP | Status: DC

## 2015-07-25 MED ORDER — OMEPRAZOLE 20 MG PO CPDR: 20.0000 mg | DELAYED_RELEASE_CAPSULE | Freq: Every day | ORAL | Status: DC

## 2015-07-25 MED ORDER — LISINOPRIL 20 MG PO TABS: 20.0000 mg | ORAL_TABLET | Freq: Every day | ORAL | Status: DC

## 2015-07-25 MED ORDER — PRAVASTATIN SODIUM 40 MG PO TABS
40.0000 mg | ORAL_TABLET | Freq: Every day | ORAL | Status: DC
Start: 2015-07-25 — End: 2016-09-13

## 2015-07-25 MED ORDER — GLIMEPIRIDE 4 MG PO TABS: 4.0000 mg | ORAL_TABLET | Freq: Two times a day (BID) | ORAL | Status: DC

## 2015-07-25 MED ORDER — GABAPENTIN 300 MG PO CAPS: 300.0000 mg | ORAL_CAPSULE | Freq: Every day | ORAL | Status: DC

## 2015-07-25 MED ORDER — TRAMADOL HCL 50 MG PO TABS: 50.0000 mg | ORAL_TABLET | Freq: Four times a day (QID) | ORAL | Status: DC | PRN

## 2015-07-25 MED ORDER — SITAGLIPTIN PHOSPHATE 100 MG PO TABS
100.0000 mg | ORAL_TABLET | Freq: Every day | ORAL | Status: DC
Start: 2015-07-25 — End: 2016-02-28

## 2015-07-25 MED ORDER — ZOSTER VACCINE LIVE 19400 UNT/0.65ML ~~LOC~~ SOLR: 0.6500 mL | Freq: Once | SUBCUTANEOUS | Status: DC

## 2015-07-25 NOTE — Progress Notes (Signed)
Subjective:    Patient ID: Daniel [name], male    DOB: 07/31/1950, 65 y.o.   MRN: [id number]  07/25/2015  Follow-up   HPI This 65 y.o. male presents for three month follow-up:  1. Lower back pain: twisted back one week ago.  R sided; no radiation; no n/t/w.    2.  DMII: +tingling and burning in B toes distally long standing issue for five years.  Diagnosed with DMII ten years ago.  Last HgbA1c 9.0 three months ago.  At last visit, no medications for 7 weeks; now has been taking these medications.   Pneumovax 2014 TDAP unsure. Zostavax never. Home sugars running up due to decreased activity level; 175-180.  Wake up is always higher.   Avandamet worked great; allergic to Actos.  3.  L knee pain: twisted knee; s/p steroid injection by [facility]. Less active for five weeks.   4.  Urinary frequency: must urinate every 10-15 minutes.  Nocturia x 2.  During the day, has cup of decaf coffee every morning; drinks 3 bottles of water per day; one tea and one soda per day. If increases to 5 bottles of water per day, urinates all day.  Day of event, trying to get a job completed and returned back to work; heart has been taking off.  Counseling with two couples; very frustrating; not willing to do anything; stress related.    5. Palpitations: referred to Dr. Harrington Challenger.  No evidence of atrial fibrillation; anxiety related palpitations.  No recurrence of palpitations.  6.  Anemia:  No history of anemia.  Last colonoscopy 2007; Dr. Olevia Perches just retired.    Last CPE 05/03/2015  Review of Systems  Constitutional: Negative for fever, chills, diaphoresis, activity change, appetite change and fatigue.  Respiratory: Negative for cough and shortness of breath.   Cardiovascular: Negative for chest pain, palpitations and leg swelling.  Gastrointestinal: Negative for nausea, vomiting, abdominal pain and diarrhea.  Endocrine: Negative for cold intolerance, heat intolerance, polydipsia, polyphagia and polyuria.  Skin:  Negative for color change, rash and wound.  Neurological: Negative for dizziness, tremors, seizures, syncope, facial asymmetry, speech difficulty, weakness, light-headedness, numbness and headaches.  Psychiatric/Behavioral: Negative for sleep disturbance and dysphoric mood. The patient is not nervous/anxious.     Past Medical History  Diagnosis Date  . Diabetes mellitus ([place])   . Benign hypertension   . Arthritis   . History of pneumonia   . HLD (hyperlipidemia)   . Allergic rhinitis   . Allergy    Past Surgical History  Procedure Laterality Date  . Cataract extraction      bilateral  . Knee surgery      right  . Eye surgery     Allergies  Allergen Reactions  . Actos [Pioglitazone] Other (See Comments), Shortness Of Breath and Swelling    Blood sugar went up and down and sweating profusely  . Codeine Nausea Only and Palpitations    Palpitations, SOB   Current Outpatient Prescriptions  Medication Sig Dispense Refill  . albuterol (PROVENTIL HFA;VENTOLIN HFA) 108 (90 BASE) MCG/ACT inhaler Inhale 2 puffs into the lungs every 6 (six) hours as needed.    Marland Kitchen aspirin 81 MG tablet Take 81 mg by mouth daily.    . cetirizine (ZYRTEC) 10 MG tablet Take 10 mg by mouth daily.    Marland Kitchen glimepiride (AMARYL) 4 MG tablet Take 1 tablet (4 mg total) by mouth 2 (two) times daily. 180 tablet 3  . ibuprofen (ADVIL,MOTRIN) 200 MG tablet  Take 800 mg by mouth every morning.    Marland Kitchen lisinopril (PRINIVIL,ZESTRIL) 20 MG tablet Take 1 tablet (20 mg total) by mouth daily. 90 tablet 3  . loperamide (IMODIUM A-D) 2 MG tablet Take 2 mg by mouth as needed.    . metFORMIN (GLUCOPHAGE) 1000 MG tablet Take 1 tablet (1,000 mg total) by mouth 2 (two) times daily with a meal. 180 tablet 3  . omeprazole (PRILOSEC) 20 MG capsule Take 1 capsule (20 mg total) by mouth daily. 90 capsule 3  . pravastatin (PRAVACHOL) 40 MG tablet Take 1 tablet (40 mg total) by mouth daily. 90 tablet 3  . gabapentin (NEURONTIN) 300 MG capsule Take  1 capsule (300 mg total) by mouth at bedtime. 90 capsule 3  . glucose blood (BL TEST STRIP PACK) test strip Use as instructed 100 each 12  . sitaGLIPtin (JANUVIA) 100 MG tablet Take 1 tablet (100 mg total) by mouth daily. 90 tablet 3  . traMADol (ULTRAM) 50 MG tablet Take 1 tablet (50 mg total) by mouth every 6 (six) hours as needed. 120 tablet 5  . zoster vaccine live, PF, (ZOSTAVAX) 16109 UNT/0.65ML injection Inject 19,400 Units into the skin once. 1 each 0   No current facility-administered medications for this visit.   Social History   Social History  . Marital Status: Married    Spouse Name: N/A  . Number of Children: 3  . Years of Education: N/A   Occupational History  . tile Chief Strategy Officer, Company secretary    Social History Main Topics  . Smoking status: Never Smoker   . Smokeless tobacco: Never Used  . Alcohol Use: No  . Drug Use: No  . Sexual Activity: Not on file   Other Topics Concern  . Not on file   Social History Narrative   Family History  Problem Relation Age of Onset  . Diabetes Father   . Colon polyps Father   . Prostate cancer Paternal Uncle   . Atrial fibrillation Mother        Objective:    BP 135/77 mmHg  Pulse 71  Temp(Src) 97.8 F (36.6 C) (Oral)  Resp 16  Ht 5' 6.75" (1.695 m)  Wt 240 lb 12.8 oz (109.226 kg)  BMI 38.02 kg/m2  SpO2 96% Physical Exam  Constitutional: He is oriented to person, place, and time. He appears well-developed and well-nourished. No distress.  HENT:  Head: Normocephalic and atraumatic.  Right Ear: External ear normal.  Left Ear: External ear normal.  Nose: Nose normal.  Mouth/Throat: Oropharynx is clear and moist.  Eyes: Conjunctivae and EOM are normal. Pupils are equal, round, and reactive to light.  Neck: Normal range of motion. Neck supple. Carotid bruit is not present. No thyromegaly present.  Cardiovascular: Normal rate, regular rhythm, normal heart sounds and intact distal pulses.  Exam reveals no gallop and no  friction rub.   No murmur heard. Pulmonary/Chest: Effort normal and breath sounds normal. He has no wheezes. He has no rales.  Abdominal: Soft. Bowel sounds are normal. He exhibits no distension and no mass. There is no tenderness. There is no rebound and no guarding.  Lymphadenopathy:    He has no cervical adenopathy.  Neurological: He is alert and oriented to person, place, and time. No cranial nerve deficit.  Skin: Skin is warm and dry. No rash noted. He is not diaphoretic.  Psychiatric: He has a normal mood and affect. His behavior is normal.  Nursing note and vitals reviewed.  Results for  orders placed or performed in visit on 07/25/15  POCT glycosylated hemoglobin (Hb A1C)  Result Value Ref Range   Hemoglobin A1C 9.5   POCT glucose (manual entry)  Result Value Ref Range   POC Glucose 165 (A) 70 - 99 mg/dl       Assessment & Plan:   1. Type 2 diabetes mellitus with diabetic polyneuropathy, without long-term current use of insulin ([place])   2. Anemia, unspecified anemia type   3. Need for hepatitis C screening test   4. Urinary frequency   5. Hyperlipidemia   6. Hypercholesteremia   7. Diabetes mellitus type 2 in obese ([place])   8. Essential hypertension   9. Primary osteoarthritis of left knee     Orders Placed This Encounter  Procedures  . CBC with Differential/Platelet  . Comprehensive metabolic panel  . Iron  . IBC panel  . Vitamin B12  . Hepatitis C antibody  . PSA  . Lipid panel    Order Specific Question:  Has the patient fasted?    Answer:  Yes  . POCT glycosylated hemoglobin (Hb A1C)  . POCT glucose (manual entry)  . HM Diabetes Foot Exam   Meds ordered this encounter  Medications  . zoster vaccine live, PF, (ZOSTAVAX) 16109 UNT/0.65ML injection    Sig: Inject 19,400 Units into the skin once.    Dispense:  1 each    Refill:  0  . traMADol (ULTRAM) 50 MG tablet    Sig: Take 1 tablet (50 mg total) by mouth every 6 (six) hours as needed.    Dispense:   120 tablet    Refill:  5  . pravastatin (PRAVACHOL) 40 MG tablet    Sig: Take 1 tablet (40 mg total) by mouth daily.    Dispense:  90 tablet    Refill:  3  . omeprazole (PRILOSEC) 20 MG capsule    Sig: Take 1 capsule (20 mg total) by mouth daily.    Dispense:  90 capsule    Refill:  3  . metFORMIN (GLUCOPHAGE) 1000 MG tablet    Sig: Take 1 tablet (1,000 mg total) by mouth 2 (two) times daily with a meal.    Dispense:  180 tablet    Refill:  3  . lisinopril (PRINIVIL,ZESTRIL) 20 MG tablet    Sig: Take 1 tablet (20 mg total) by mouth daily.    Dispense:  90 tablet    Refill:  3  . glimepiride (AMARYL) 4 MG tablet    Sig: Take 1 tablet (4 mg total) by mouth 2 (two) times daily.    Dispense:  180 tablet    Refill:  3  . gabapentin (NEURONTIN) 300 MG capsule    Sig: Take 1 capsule (300 mg total) by mouth at bedtime.    Dispense:  90 capsule    Refill:  3  . glucose blood (BL TEST STRIP PACK) test strip    Sig: Use as instructed    Dispense:  100 each    Refill:  12    [facility] meter check sugars once daily  . sitaGLIPtin (JANUVIA) 100 MG tablet    Sig: Take 1 tablet (100 mg total) by mouth daily.    Dispense:  90 tablet    Refill:  3    Return in about 3 months (around 10/23/2015) for recheck diabetes.    Briseis Aguilera Elayne Guerin, M.D. [facility] [address] [place], [place]  [address] [phone]  [phone] phone [phone] fax

## 2015-07-25 NOTE — Patient Instructions (Signed)

## 2015-07-28 ENCOUNTER — Other Ambulatory Visit: Payer: Self-pay

## 2015-07-28 MED ORDER — GLUCOSE BLOOD VI STRP: ORAL_STRIP | Status: DC

## 2015-08-08 ENCOUNTER — Encounter: Payer: Self-pay | Admitting: Family Medicine

## 2015-09-03 ENCOUNTER — Telehealth: Payer: Self-pay

## 2015-09-03 NOTE — Telephone Encounter (Signed)
[facility] on [address] is calling because Dr. Thompson Caul DEA number is expired. They want to know if it's been updated

## 2015-10-18 ENCOUNTER — Telehealth: Payer: Self-pay

## 2015-10-18 NOTE — Telephone Encounter (Signed)
He will need an OV because he does not have asthma on his problem list or any need for inhaler and we have not seen him recently for any breathing issues.

## 2015-10-18 NOTE — Telephone Encounter (Signed)
Pt advised.

## 2015-10-18 NOTE — Telephone Encounter (Signed)
Pt is needing a refill on his albuteral inhaler,states he had not needed it when he was in last but does now   Best number [phone]

## 2015-10-18 NOTE — Telephone Encounter (Signed)
Can we Rx, I dont see that we have prescribed this in a while.

## 2015-10-19 ENCOUNTER — Ambulatory Visit (INDEPENDENT_AMBULATORY_CARE_PROVIDER_SITE_OTHER): Payer: Medicare Other | Admitting: Family Medicine

## 2015-10-19 VITALS — BP 159/76 | HR 91 | Temp 98.0°F | Resp 17 | Ht 66.5 in | Wt 246.0 lb

## 2015-10-19 DIAGNOSIS — J4521 Mild intermittent asthma with (acute) exacerbation: Secondary | ICD-10-CM | POA: Diagnosis not present

## 2015-10-19 DIAGNOSIS — J3089 Other allergic rhinitis: Secondary | ICD-10-CM

## 2015-10-19 DIAGNOSIS — E1142 Type 2 diabetes mellitus with diabetic polyneuropathy: Secondary | ICD-10-CM

## 2015-10-19 MED ORDER — ALBUTEROL SULFATE HFA 108 (90 BASE) MCG/ACT IN AERS: 2.0000 | INHALATION_SPRAY | RESPIRATORY_TRACT | Status: DC | PRN

## 2015-10-19 MED ORDER — ALBUTEROL SULFATE (2.5 MG/3ML) 0.083% IN NEBU
2.5000 mg | INHALATION_SOLUTION | Freq: Once | RESPIRATORY_TRACT | Status: AC
  Administered 2015-10-19: 2.5 mg via RESPIRATORY_TRACT

## 2015-10-19 MED ORDER — MONTELUKAST SODIUM 10 MG PO TABS: 10.0000 mg | ORAL_TABLET | Freq: Every day | ORAL | Status: DC

## 2015-10-19 MED ORDER — MOMETASONE FUROATE 50 MCG/ACT NA SUSP: 2.0000 | Freq: Every day | NASAL | Status: DC

## 2015-10-19 MED ORDER — BUDESONIDE-FORMOTEROL FUMARATE 80-4.5 MCG/ACT IN AERO: 2.0000 | INHALATION_SPRAY | Freq: Two times a day (BID) | RESPIRATORY_TRACT | Status: DC

## 2015-10-19 NOTE — Patient Instructions (Signed)
Hay Fever Hay fever is an allergic reaction to particles in the air. It cannot be passed from person to person. It cannot be cured, but it can be controlled. CAUSES  Hay fever is caused by something that triggers an allergic reaction (allergens). The following are examples of allergens:  Ragweed.  Feathers.  Animal dander.  Grass and tree pollens.  Cigarette smoke.  House dust.  Pollution. SYMPTOMS   Sneezing.  Runny or stuffy nose.  Tearing eyes.  Itchy eyes, nose, mouth, throat, skin, or other area.  Sore throat.  Headache.  Decreased sense of smell or taste. DIAGNOSIS Your caregiver will perform a physical exam and ask questions about the symptoms you are having.Allergy testing may be done to determine exactly what triggers your hay fever.  TREATMENT   Over-the-counter medicines may help symptoms. These include:  Antihistamines.  Decongestants. These may help with nasal congestion.  Your caregiver may prescribe medicines if over-the-counter medicines do not work.  Some people benefit from allergy shots when other medicines are not helpful. HOME CARE INSTRUCTIONS   Avoid the allergen that is causing your symptoms, if possible.  Take all medicine as told by your caregiver. SEEK MEDICAL CARE IF:   You have severe allergy symptoms and your current medicines are not helping.  Your treatment was working at one time, but you are now experiencing symptoms.  You have sinus congestion and pressure.  You develop a fever or headache.  You have thick nasal discharge.  You have asthma and have a worsening cough and wheezing. SEEK IMMEDIATE MEDICAL CARE IF:   You have swelling of your tongue or lips.  You have trouble breathing.  You feel lightheaded or like you are going to faint.  You have cold sweats.  You have a fever.   This information is not intended to replace advice given to you by your health care provider. Make sure you discuss any  questions you have with your health care provider.   Document Released: 06/18/2005 Document Revised: 09/10/2011 Document Reviewed: 12/29/2014 Elsevier Interactive Patient Education 2016 [place].  Asthma, Acute Bronchospasm Acute bronchospasm caused by asthma is also referred to as an asthma attack. Bronchospasm means your air passages become narrowed. The narrowing is caused by inflammation and tightening of the muscles in the air tubes (bronchi) in your lungs. This can make it hard to breathe or cause you to wheeze and cough. CAUSES Possible triggers are:  Animal dander from the skin, hair, or feathers of animals.  Dust mites contained in house dust.  Cockroaches.  Pollen from trees or grass.  Mold.  Cigarette or tobacco smoke.  Air pollutants such as dust, household cleaners, hair sprays, aerosol sprays, paint fumes, strong chemicals, or strong odors.  Cold air or weather changes. Cold air may trigger inflammation. Winds increase molds and pollens in the air.  Strong emotions such as crying or laughing hard.  Stress.  Certain medicines such as aspirin or beta-blockers.  Sulfites in foods and drinks, such as dried fruits and wine.  Infections or inflammatory conditions, such as a flu, cold, or inflammation of the nasal membranes (rhinitis).  Gastroesophageal reflux disease (GERD). GERD is a condition where stomach acid backs up into your esophagus.  Exercise or strenuous activity. SIGNS AND SYMPTOMS   Wheezing.  Excessive coughing, particularly at night.  Chest tightness.  Shortness of breath. DIAGNOSIS  Your health care provider will ask you about your medical history and perform a physical exam. A chest X-ray or  blood testing may be performed to look for other causes of your symptoms or other conditions that may have triggered your asthma attack. TREATMENT  Treatment is aimed at reducing inflammation and opening up the airways in your lungs. Most asthma  attacks are treated with inhaled medicines. These include quick relief or rescue medicines (such as bronchodilators) and controller medicines (such as inhaled corticosteroids). These medicines are sometimes given through an inhaler or a nebulizer. Systemic steroid medicine taken by mouth or given through an IV tube also can be used to reduce the inflammation when an attack is moderate or severe. Antibiotic medicines are only used if a bacterial infection is present.  HOME CARE INSTRUCTIONS   Rest.  Drink plenty of liquids. This helps the mucus to remain thin and be easily coughed up. Only use caffeine in moderation and do not use alcohol until you have recovered from your illness.  Do not smoke. Avoid being exposed to secondhand smoke.  You play a critical role in keeping yourself in good health. Avoid exposure to things that cause you to wheeze or to have breathing problems.  Keep your medicines up-to-date and available. Carefully follow your health care provider's treatment plan.  Take your medicine exactly as prescribed.  When pollen or pollution is bad, keep windows closed and use an air conditioner or go to places with air conditioning.  Asthma requires careful medical care. See your health care provider for a follow-up as advised. If you are more than [redacted] weeks pregnant and you were prescribed any new medicines, let your obstetrician know about the visit and how you are doing. Follow up with your health care provider as directed.  After you have recovered from your asthma attack, make an appointment with your outpatient doctor to talk about ways to reduce the likelihood of future attacks. If you do not have a doctor who manages your asthma, make an appointment with a primary care doctor to discuss your asthma. SEEK IMMEDIATE MEDICAL CARE IF:   You are getting worse.  You have trouble breathing. If severe, call your local emergency services (911 in the [place]  You develop chest pain or  discomfort.  You are vomiting.  You are not able to keep fluids down.  You are coughing up yellow, green, brown, or bloody sputum.  You have a fever and your symptoms suddenly get worse.  You have trouble swallowing. MAKE SURE YOU:   Understand these instructions.  Will watch your condition.  Will get help right away if you are not doing well or get worse.   This information is not intended to replace advice given to you by your health care provider. Make sure you discuss any questions you have with your health care provider.   Document Released: 10/03/2006 Document Revised: 06/23/2013 Document Reviewed: 12/24/2012 Elsevier Interactive Patient Education [employer].

## 2015-10-19 NOTE — Progress Notes (Signed)
Subjective:  By signing my name below, I, [name], attest that this documentation has been prepared under the direction and in the presence of [name], MD.  Electronically Signed: [name], ED Scribe. 10/19/2015. 9:06 AM.   Patient ID: Daniel [name], male    DOB: [birth year], 65 y.o.   MRN: [id number]  HPI Chief Complaint  Patient presents with  . Medication Refill  . Shortness of Breath    albuterol    HPI Comments: Daniel [name] is a 65 y.o. male who presents to the [facility] complaining of SOB. He was last seen 3 months ago. A1C was 9.5, CMP was normal, cholesterol was normal, micro albumin normal 05/2015. He has DM neuropathy. He recently restarted medication at the 1st of the year. Tolerating Avandamet well but allergic to Actos. Cardiologist is Dr. Harrington Challenger. At his last visit A1C reflect compliance for only half of the time. Pneumovax 2014, has not had tetanus or zostavax. Takes metformin 1000mg , baby aspirin, Amaryl 4 bid.   Pt c/o of gradually worsening SOB due to allergies that began 6 days ago. Pt states he is allergic oak pollen. He started zyrtec, mucinex and an herbal supplement, clear lung without relief to symptoms. He is requesting a refill of albuterol. Pt last used Flovent, 2 puffs twice a day, a year ago due to allergy flare. 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 He also reports hx of pneumonia 6 time.  He denies hx of smoking.   Depression screen [facility] 2/9 07/25/2015 05/03/2015 02/11/2015  Decreased Interest 0 0 0  Down, Depressed, Hopeless 0 0 0  PHQ - 2 Score 0 0 0     Patient Active Problem List   Diagnosis Date Noted  . Primary osteoarthritis of left knee 07/25/2015  . Essential hypertension 07/25/2015  . Hypercholesteremia 07/25/2015  . Hyperlipidemia 07/25/2015  . Type 2 diabetes mellitus with diabetic polyneuropathy, without long-term current use of insulin ([place]) 07/25/2015   Past Medical History  Diagnosis Date  . Diabetes mellitus  ([place])   . Benign hypertension   . Arthritis   . History of pneumonia   . HLD (hyperlipidemia)   . Allergic rhinitis   . Allergy    Past Surgical History  Procedure Laterality Date  . Cataract extraction      bilateral  . Knee surgery      right  . Eye surgery     Allergies  Allergen Reactions  . Actos [Pioglitazone] Other (See Comments), Shortness Of Breath and Swelling    Blood sugar went up and down and sweating profusely  . Codeine Nausea Only and Palpitations    Palpitations, SOB   Prior to Admission medications   Medication Sig Start Date End Date Taking? Authorizing Provider  aspirin 81 MG tablet Take 81 mg by mouth daily.   Yes Historical Provider, MD  cetirizine (ZYRTEC) 10 MG tablet Take 10 mg by mouth daily.   Yes Historical Provider, MD  gabapentin (NEURONTIN) 300 MG capsule Take 1 capsule (300 mg total) by mouth at bedtime. 07/25/15  Yes Wardell Honour, MD  glimepiride (AMARYL) 4 MG tablet Take 1 tablet (4 mg total) by mouth 2 (two) times daily. 07/25/15  Yes Wardell Honour, MD  glucose blood test strip check sugars once daily. Dx: E11.42 07/28/15  Yes Wardell Honour, MD  ibuprofen (ADVIL,MOTRIN) 200 MG tablet Take 800 mg by mouth every morning.   Yes Historical Provider, MD  lisinopril (  PRINIVIL,ZESTRIL) 20 MG tablet Take 1 tablet (20 mg total) by mouth daily. 07/25/15  Yes Wardell Honour, MD  loperamide (IMODIUM A-D) 2 MG tablet Take 2 mg by mouth as needed.   Yes Historical Provider, MD  metFORMIN (GLUCOPHAGE) 1000 MG tablet Take 1 tablet (1,000 mg total) by mouth 2 (two) times daily with a meal. 07/25/15  Yes Wardell Honour, MD  omeprazole (PRILOSEC) 20 MG capsule Take 1 capsule (20 mg total) by mouth daily. 07/25/15  Yes Wardell Honour, MD  pravastatin (PRAVACHOL) 40 MG tablet Take 1 tablet (40 mg total) by mouth daily. 07/25/15  Yes Wardell Honour, MD  sitaGLIPtin (JANUVIA) 100 MG tablet Take 1 tablet (100 mg total) by mouth daily. 07/25/15  Yes Wardell Honour, MD    traMADol (ULTRAM) 50 MG tablet Take 1 tablet (50 mg total) by mouth every 6 (six) hours as needed. 07/25/15  Yes Wardell Honour, MD  albuterol (PROVENTIL HFA;VENTOLIN HFA) 108 (90 BASE) MCG/ACT inhaler Inhale 2 puffs into the lungs every 6 (six) hours as needed. Reported on 10/19/2015    Historical Provider, MD  zoster vaccine live, PF, (ZOSTAVAX) 16109 UNT/0.65ML injection Inject 19,400 Units into the skin once. Patient not taking: Reported on 10/19/2015 07/25/15   Wardell Honour, MD   Review of Systems  Constitutional: Positive for activity change and appetite change. Negative for fever and chills.  HENT: Positive for congestion, postnasal drip, rhinorrhea, sinus pressure and sore throat. Negative for ear pain, facial swelling, trouble swallowing and voice change.   Respiratory: Positive for cough, chest tightness, shortness of breath and wheezing.   Cardiovascular: Negative for chest pain and palpitations.  Allergic/Immunologic: Positive for environmental allergies. Negative for immunocompromised state.  Psychiatric/Behavioral: Positive for sleep disturbance. Negative for dysphoric mood. The patient is not nervous/anxious.     Objective:   Physical Exam  Constitutional: He is oriented to person, place, and time. He appears well-developed and well-nourished. No distress.  HENT:  Head: Normocephalic and atraumatic.  Right Ear: Tympanic membrane normal.  Left Ear: Tympanic membrane normal.  Nose: Nose normal.  Mouth/Throat: Oropharynx is clear and moist. No oropharyngeal exudate or posterior oropharyngeal erythema.  Eyes: Conjunctivae and EOM are normal.  Neck: Neck supple. No thyromegaly present.  Cardiovascular: Normal rate.   Pulmonary/Chest: Effort normal.  Forced exhalation induces cough.   Musculoskeletal: Normal range of motion.  Lymphadenopathy:    He has no cervical adenopathy.       Right: No supraclavicular adenopathy present.       Left: No supraclavicular adenopathy present.   Neurological: He is alert and oriented to person, place, and time.  Skin: Skin is warm and dry.  Psychiatric: He has a normal mood and affect. His behavior is normal.  Nursing note and vitals reviewed.  BP 159/76 mmHg  Pulse 91  Temp(Src) 98 F (36.7 C) (Oral)  Resp 17  Ht 5' 6.5" (1.689 m)  Wt 246 lb (111.585 kg)  BMI 39.12 kg/m2  SpO2 96%  PF 280 L/min  Filed Vitals:   10/19/15 0837  BP: 159/76  Pulse: 91  Temp: 98 F (36.7 C)  TempSrc: Oral  Resp: 17  Height: 5' 6.5" (1.689 m)  Weight: 246 lb (111.585 kg)  SpO2: 96%    Office Spirometry Results: Predicted peak flow -554 L/min  1st Peak Flow: 280 L/MIN  2nd Peak Flow: 280 L/MIN (320L/min)   Assessment & Plan:   1. Type 2 diabetes mellitus with diabetic  polyneuropathy, without long-term current use of insulin ([place])   2. Environmental and seasonal allergies   3. Reactive airway disease, mild intermittent, with acute exacerbation     Orders Placed This Encounter  Procedures  . Spirometry: Peak    Meds ordered this encounter  Medications  . albuterol (PROVENTIL) (2.5 MG/3ML) 0.083% nebulizer solution 2.5 mg    Sig:   . albuterol (PROVENTIL HFA;VENTOLIN HFA) 108 (90 Base) MCG/ACT inhaler    Sig: Inhale 2 puffs into the lungs every 4 (four) hours as needed for wheezing or shortness of breath. Reported on 10/19/2015    Dispense:  1 Inhaler    Refill:  5  . montelukast (SINGULAIR) 10 MG tablet    Sig: Take 1 tablet (10 mg total) by mouth at bedtime.    Dispense:  90 tablet    Refill:  3  . mometasone (NASONEX) 50 MCG/ACT nasal spray    Sig: Place 2 sprays into the nose daily.    Dispense:  17 g    Refill:  12  . budesonide-formoterol (SYMBICORT) 80-4.5 MCG/ACT inhaler    Sig: Inhale 2 puffs into the lungs 2 (two) times daily.    Dispense:  1 Inhaler    Refill:  5   If insurance does not cover Symbicort, can switch to  Asmanex or free trial dulera     I personally performed the services described in  this documentation, which was scribed in my presence. The recorded information has been reviewed and considered, and addended by me as needed.  [name], MD MPH

## 2015-11-08 ENCOUNTER — Ambulatory Visit: Payer: Medicare Other | Admitting: Family Medicine

## 2016-01-26 ENCOUNTER — Other Ambulatory Visit: Payer: Self-pay | Admitting: Family Medicine

## 2016-01-26 NOTE — Telephone Encounter (Signed)
Please call in refill of Tramadol as approved. Also, please call patient; he is due for his six month follow-up with me.  He will not be able to receive further refills of any medications without follow-up. Please give him my same day/walk in schedule for the next two weeks.  He must see ME to receive refills of Tramadol.

## 2016-01-27 NOTE — Telephone Encounter (Signed)
Called in Rx. Spoke to pt and explained our new same day appts. Pt agreed to come in for f/up with Dr Tamala Julian before he needs more RFs.

## 2016-02-28 ENCOUNTER — Ambulatory Visit (INDEPENDENT_AMBULATORY_CARE_PROVIDER_SITE_OTHER): Payer: Medicare Other | Admitting: Family Medicine

## 2016-02-28 ENCOUNTER — Encounter: Payer: Self-pay | Admitting: Family Medicine

## 2016-02-28 VITALS — BP 142/80 | HR 70 | Temp 98.2°F | Resp 18 | Ht 66.5 in | Wt 238.0 lb

## 2016-02-28 DIAGNOSIS — E1142 Type 2 diabetes mellitus with diabetic polyneuropathy: Secondary | ICD-10-CM

## 2016-02-28 DIAGNOSIS — F418 Other specified anxiety disorders: Secondary | ICD-10-CM

## 2016-02-28 DIAGNOSIS — E7800 Pure hypercholesterolemia, unspecified: Secondary | ICD-10-CM | POA: Diagnosis not present

## 2016-02-28 DIAGNOSIS — I10 Essential (primary) hypertension: Secondary | ICD-10-CM

## 2016-02-28 DIAGNOSIS — F32A Depression, unspecified: Secondary | ICD-10-CM

## 2016-02-28 DIAGNOSIS — F419 Anxiety disorder, unspecified: Secondary | ICD-10-CM

## 2016-02-28 DIAGNOSIS — M1712 Unilateral primary osteoarthritis, left knee: Secondary | ICD-10-CM | POA: Diagnosis not present

## 2016-02-28 DIAGNOSIS — F329 Major depressive disorder, single episode, unspecified: Secondary | ICD-10-CM

## 2016-02-28 DIAGNOSIS — Z23 Encounter for immunization: Secondary | ICD-10-CM

## 2016-02-28 LAB — LIPID PANEL
Cholesterol: 188 mg/dL (ref 125–200)
HDL: 65 mg/dL (ref >=40)
LDL CALC: 90 mg/dL (ref <130)
TRIGLYCERIDES: 166 mg/dL — AB (ref <150)
Total CHOL/HDL Ratio: 2.9 Ratio (ref <=5.0)
VLDL: 33 mg/dL — ABNORMAL HIGH (ref <30)

## 2016-02-28 LAB — COMPREHENSIVE METABOLIC PANEL
ALBUMIN: 4 g/dL (ref 3.6–5.1)
ALK PHOS: 73 U/L (ref 40–115)
ALT: 46 U/L (ref 9–46)
AST: 48 U/L — AB (ref 10–35)
BILIRUBIN TOTAL: 0.4 mg/dL (ref 0.2–1.2)
BUN: 14 mg/dL (ref 7–25)
CO2: 24 mmol/L (ref 20–31)
CREATININE: 0.95 mg/dL (ref 0.70–1.25)
Calcium: 9.3 mg/dL (ref 8.6–10.3)
Chloride: 102 mmol/L (ref 98–110)
GLUCOSE: 177 mg/dL — AB (ref 65–99)
Potassium: 4.7 mmol/L (ref 3.5–5.3)
SODIUM: 137 mmol/L (ref 135–146)
Total Protein: 6.7 g/dL (ref 6.1–8.1)

## 2016-02-28 LAB — CBC WITH DIFFERENTIAL/PLATELET
BASOS PCT: 1 %
Basophils Absolute: 77 cells/uL (ref 0–200)
EOS PCT: 3 %
Eosinophils Absolute: 231 cells/uL (ref 15–500)
HEMATOCRIT: 37.2 % — AB (ref 38.5–50.0)
HEMOGLOBIN: 12.4 g/dL — AB (ref 13.2–17.1)
LYMPHS ABS: 2387 {cells}/uL (ref 850–3900)
Lymphocytes Relative: 31 %
MCH: 29.2 pg (ref 27.0–33.0)
MCHC: 33.3 g/dL (ref 32.0–36.0)
MCV: 87.7 fL (ref 80.0–100.0)
MONO ABS: 770 {cells}/uL (ref 200–950)
MPV: 10.4 fL (ref 7.5–12.5)
Monocytes Relative: 10 %
NEUTROS ABS: 4235 {cells}/uL (ref 1500–7800)
NEUTROS PCT: 55 %
Platelets: 347 10*3/uL (ref 140–400)
RBC: 4.24 MIL/uL (ref 4.20–5.80)
RDW: 13 % (ref 11.0–15.0)
WBC: 7.7 10*3/uL (ref 3.8–10.8)

## 2016-02-28 LAB — POCT GLYCOSYLATED HEMOGLOBIN (HGB A1C): Hemoglobin A1C: 10.1

## 2016-02-28 LAB — GLUCOSE, POCT (MANUAL RESULT ENTRY): POC GLUCOSE: 179 mg/dL — AB (ref 70–99)

## 2016-02-28 MED ORDER — SERTRALINE HCL 50 MG PO TABS: 50.0000 mg | ORAL_TABLET | Freq: Every day | ORAL | 1 refills | Status: DC

## 2016-02-28 MED ORDER — LISINOPRIL 20 MG PO TABS: 20.0000 mg | ORAL_TABLET | Freq: Every day | ORAL | 3 refills | Status: DC

## 2016-02-28 MED ORDER — TRAMADOL HCL 50 MG PO TABS: 50.0000 mg | ORAL_TABLET | Freq: Four times a day (QID) | ORAL | 5 refills | Status: DC | PRN

## 2016-02-28 MED ORDER — SITAGLIPTIN PHOSPHATE 100 MG PO TABS: 100.0000 mg | ORAL_TABLET | Freq: Every day | ORAL | 3 refills | Status: DC

## 2016-02-28 NOTE — Patient Instructions (Signed)
     IF you received an x-ray today, you will receive an invoice from [facility]. Please contact [facility] at [phone] with questions or concerns regarding your invoice.   IF you received labwork today, you will receive an invoice from [facility]. Please contact [facility] at [phone] with questions or concerns regarding your invoice.   Our billing staff will not be able to assist you with questions regarding bills from these companies.  You will be contacted with the lab results as soon as they are available. The fastest way to get your results is to activate your My Chart account. Instructions are located on the last page of this paperwork. If you have not heard from us regarding the results in 2 weeks, please contact this office.      

## 2016-02-28 NOTE — Progress Notes (Signed)
By signing my name below, I, Mesha Guinyard, attest that this documentation has been prepared under the direction and in the presence of [employer]  Electronically Signed: Verlee Monte, Medical Scribe. 03/30/16. 10:28 AM.  Subjective:    Patient ID: Daniel [name], male    DOB: August 31, 1950, 65 y.o.   MRN: [id number]   02/28/2016  Follow-up (DIABETES/MEDICATION ADJUSTMENT/ ran out Januvia and Lisinopril)   HPI  HPI Comments: Daniel [name] is a 65 y.o. male who presents to the [facility] for DM follow-up. Pt was last seen by me 07/2015 and his A1c was 9.5 but he was off of his medication for a while so we restarted his medication. Pt saw Dr. Brigitte Pulse in April for an acute visit.  Left Knee Pain: Pt mentions his left knee pain is bearable and he's compliant with Tramadol. Pt takes it about 6 times a day, 2 tablets at a time. Pt will be out of his medication tomorrow and he needs a refill.  DM: Pt's blood sugar levels haven't been great since he got off of Januvia. His morning reading is 160-190 to 200 depending on what he ate and if he took Metformin yet. Pt ran out of Januvia, and Lsinopril 5 weeks ago and before he ran out they were running 140-160  Pt is compliant with Pravastatin, Prilosec, Singular, ASA, and Tramadol. Pt mentions he hasn't had to start Nasonex yet but will soon and Miralax has been helping his bm. Pt denies constipation, and diarrhea.  Depression: Pt mentions life has been crazy. 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 Pt worked about 80 hours last week and almost passed out from fatigue. Pt mentions his cardiologist told him his main problem is anxiety, and he's supposed to cut back on his caffeine. Pt was told his vascular system was in good shape during his last visit in Dec. Pt is having trouble with his wife and his anniversary is coming up in the next month. Pt doesn't remember her ever being  "like this". She's taking a new medication, Zoloft, and it's making her "different". Pt states his wife wont go to counseling. Pt's wife had a extensive FHx of schizophrenia, and bi-polar disorder. Pt mentions he can normally handle his dysphoric mood, but lately "it's been coming like a freight train". Pt mentions he prays a lot and sees 2 pastors who are counselors for his depression and anxiety. Pt has never been on a medication for anxiety and depression, but is interested in it. Pt denies suicidal ideation, homicidal ideation, self-injury, or chest pain. Depression screen [facility] 2/9 02/28/2016 07/25/2015 05/03/2015 02/11/2015  Decreased Interest 0 0 0 0  Down, Depressed, Hopeless 0 0 0 0  PHQ - 2 Score 0 0 0 0   Review of Systems  Constitutional: Positive for fatigue. Negative for activity change, appetite change, chills, diaphoresis and fever.  Eyes: Negative for visual disturbance.  Respiratory: Negative for cough and shortness of breath.   Cardiovascular: Negative for chest pain, palpitations and leg swelling.  Gastrointestinal: Negative for constipation and diarrhea.  Endocrine: Negative for cold intolerance, heat intolerance, polydipsia, polyphagia and polyuria.  Musculoskeletal: Positive for arthralgias (chronic but has been managed).  Neurological: Negative for dizziness, tremors, seizures, syncope, facial asymmetry, speech difficulty, weakness, light-headedness, numbness and headaches.  Psychiatric/Behavioral: Positive for dysphoric mood. Negative for self-injury and suicidal ideas. The patient is  nervous/anxious.    Past Medical History:  Diagnosis Date  . Allergic rhinitis   . Allergy   . Arthritis   . Benign hypertension   . Diabetes mellitus ([place])   . History of pneumonia   . HLD (hyperlipidemia)    Past Surgical History:  Procedure Laterality Date  . CATARACT EXTRACTION     bilateral  . EYE SURGERY    . KNEE SURGERY     right   Allergies  Allergen Reactions  . Actos  [Pioglitazone] Other (See Comments), Shortness Of Breath and Swelling    Blood sugar went up and down and sweating profusely  . Codeine Nausea Only and Palpitations    Palpitations, SOB    Social History   Social History  . Marital status: Married    Spouse name: N/A  . Number of children: 3  . Years of education: N/A   Occupational History  . tile Chief Strategy Officer, Company secretary    Social History Main Topics  . Smoking status: Never Smoker  . Smokeless tobacco: Never Used  . Alcohol use No  . Drug use: No  . Sexual activity: Not on file   Other Topics Concern  . Not on file   Social History Narrative  . No narrative on file   Family History  Problem Relation Age of Onset  . Diabetes Father   . Colon polyps Father   . Atrial fibrillation Mother   . Prostate cancer Paternal Uncle        Objective:    BP (!) 142/80 (BP Location: Right Arm, Patient Position: Sitting, Cuff Size: Small)   Pulse 70   Temp 98.2 F (36.8 C) (Oral)   Resp 18   Ht 5' 6.5" (1.689 m)   Wt 238 lb (108 kg)   SpO2 96%   BMI 37.84 kg/m  Physical Exam  Constitutional: He is oriented to person, place, and time. He appears well-developed and well-nourished. No distress.  HENT:  Head: Normocephalic and atraumatic.  Right Ear: External ear normal.  Left Ear: External ear normal.  Nose: Nose normal.  Mouth/Throat: Oropharynx is clear and moist.  Eyes: Conjunctivae and EOM are normal. Pupils are equal, round, and reactive to light.  Neck: Normal range of motion. Neck supple. Carotid bruit is not present. No thyromegaly present.  Cardiovascular: Normal rate, regular rhythm, normal heart sounds and intact distal pulses.  Exam reveals no gallop and no friction rub.   No murmur heard. Pulmonary/Chest: Effort normal and breath sounds normal. No respiratory distress. He has no wheezes. He has no rales.  Abdominal: Soft. Bowel sounds are normal. He exhibits no distension and no mass. There is no tenderness.  There is no rebound and no guarding.  Lymphadenopathy:    He has no cervical adenopathy.  Neurological: He is alert and oriented to person, place, and time. No cranial nerve deficit.  Skin: Skin is warm and dry. No rash noted. He is not diaphoretic.  Psychiatric: He has a normal mood and affect. His behavior is normal.  Nursing note and vitals reviewed.  Results for orders placed or performed in visit on 02/28/16  CBC with Differential/Platelet  Result Value Ref Range   WBC 7.7 3.8 - 10.8 K/uL   RBC 4.24 4.20 - 5.80 MIL/uL   Hemoglobin 12.4 (L) 13.2 - 17.1 g/dL   HCT 37.2 (L) 38.5 - 50.0 %   MCV 87.7 80.0 - 100.0 fL   MCH 29.2 27.0 - 33.0 pg   MCHC 33.3  32.0 - 36.0 g/dL   RDW 13.0 11.0 - 15.0 %   Platelets 347 140 - 400 K/uL   MPV 10.4 7.5 - 12.5 fL   Neutro Abs 4,235 1,500 - 7,800 cells/uL   Lymphs Abs 2,387 850 - 3,900 cells/uL   Monocytes Absolute 770 200 - 950 cells/uL   Eosinophils Absolute 231 15 - 500 cells/uL   Basophils Absolute 77 0 - 200 cells/uL   Neutrophils Relative % 55 %   Lymphocytes Relative 31 %   Monocytes Relative 10 %   Eosinophils Relative 3 %   Basophils Relative 1 %   Smear Review Criteria for review not met   Comprehensive metabolic panel  Result Value Ref Range   Sodium 137 135 - 146 mmol/L   Potassium 4.7 3.5 - 5.3 mmol/L   Chloride 102 98 - 110 mmol/L   CO2 24 20 - 31 mmol/L   Glucose, Bld 177 (H) 65 - 99 mg/dL   BUN 14 7 - 25 mg/dL   Creat 0.95 0.70 - 1.25 mg/dL   Total Bilirubin 0.4 0.2 - 1.2 mg/dL   Alkaline Phosphatase 73 40 - 115 U/L   AST 48 (H) 10 - 35 U/L   ALT 46 9 - 46 U/L   Total Protein 6.7 6.1 - 8.1 g/dL   Albumin 4.0 3.6 - 5.1 g/dL   Calcium 9.3 8.6 - 10.3 mg/dL  Lipid panel  Result Value Ref Range   Cholesterol 188 125 - 200 mg/dL   Triglycerides 166 (H) <150 mg/dL   HDL 65 >=40 mg/dL   Total CHOL/HDL Ratio 2.9 <=5.0 Ratio   VLDL 33 (H) <30 mg/dL   LDL Cholesterol 90 <130 mg/dL  POCT glycosylated hemoglobin (Hb A1C)    Result Value Ref Range   Hemoglobin A1C 10.1   POCT glucose (manual entry)  Result Value Ref Range   POC Glucose 179 (A) 70 - 99 mg/dl       Assessment & Plan:   1. Type 2 diabetes mellitus with diabetic polyneuropathy, without long-term current use of insulin ([place])   2. Essential hypertension   3. Hypercholesteremia   4. Primary osteoarthritis of left knee   5. Anxiety and depression   6. Need for prophylactic vaccination and inoculation against influenza    -stable.  Obtain labs. Refills provided.   Orders Placed This Encounter  Procedures  . Flu Vaccine QUAD 36+ mos IM  . CBC with Differential/Platelet  . Comprehensive metabolic panel    Order Specific Question:   Has the patient fasted?    Answer:   Yes  . Lipid panel    Order Specific Question:   Has the patient fasted?    Answer:   Yes  . POCT glycosylated hemoglobin (Hb A1C)  . POCT glucose (manual entry)   Meds ordered this encounter  Medications  . levocetirizine (XYZAL) 5 MG tablet    Sig: Take 5 mg by mouth every evening.  . sertraline (ZOLOFT) 50 MG tablet    Sig: Take 1 tablet (50 mg total) by mouth at bedtime.    Dispense:  90 tablet    Refill:  1  . sitaGLIPtin (JANUVIA) 100 MG tablet    Sig: Take 1 tablet (100 mg total) by mouth daily.    Dispense:  90 tablet    Refill:  3  . lisinopril (PRINIVIL,ZESTRIL) 20 MG tablet    Sig: Take 1 tablet (20 mg total) by mouth daily.    Dispense:  90  tablet    Refill:  3  . traMADol (ULTRAM) 50 MG tablet    Sig: Take 1 tablet (50 mg total) by mouth every 6 (six) hours as needed.    Dispense:  120 tablet    Refill:  5    Return in about 3 months (around 05/30/2016) for recheck anxieyt/depression, diabetes.  I personally performed the services described in this documentation, which was scribed in my presence. The recorded information has been reviewed and considered.  Ashtin Rosner Elayne Guerin, M.D. [facility] [address] [place], [place]  [address] [phone] phone [phone] fax

## 2016-06-08 ENCOUNTER — Encounter: Payer: Self-pay | Admitting: Family Medicine

## 2016-08-13 ENCOUNTER — Other Ambulatory Visit: Payer: Self-pay | Admitting: Family Medicine

## 2016-08-14 NOTE — Telephone Encounter (Signed)
Last office visit was 02/28/2016, diabetes was uncontrolled with a1c of 10.1%. Last refill of gabapentin was 07/2015. I refilled 90 day supply and let patient know that he needs a recheck for additional refills of gabapentin. I will forward to Dr. Tamala Julian for her review.

## 2016-08-20 NOTE — Telephone Encounter (Signed)
Call --- patient is overdue for follow-up with me.  Please schedule OV at 104 with me in the upcoming month.

## 2016-09-13 ENCOUNTER — Ambulatory Visit (INDEPENDENT_AMBULATORY_CARE_PROVIDER_SITE_OTHER): Payer: Medicare Other | Admitting: Family Medicine

## 2016-09-13 VITALS — BP 140/70 | HR 80 | Temp 98.5°F | Resp 16 | Ht 68.0 in | Wt 240.6 lb

## 2016-09-13 DIAGNOSIS — K219 Gastro-esophageal reflux disease without esophagitis: Secondary | ICD-10-CM

## 2016-09-13 DIAGNOSIS — E1142 Type 2 diabetes mellitus with diabetic polyneuropathy: Secondary | ICD-10-CM

## 2016-09-13 DIAGNOSIS — I10 Essential (primary) hypertension: Secondary | ICD-10-CM

## 2016-09-13 DIAGNOSIS — E7800 Pure hypercholesterolemia, unspecified: Secondary | ICD-10-CM

## 2016-09-13 DIAGNOSIS — J301 Allergic rhinitis due to pollen: Secondary | ICD-10-CM

## 2016-09-13 DIAGNOSIS — F329 Major depressive disorder, single episode, unspecified: Secondary | ICD-10-CM

## 2016-09-13 DIAGNOSIS — Z125 Encounter for screening for malignant neoplasm of prostate: Secondary | ICD-10-CM | POA: Diagnosis not present

## 2016-09-13 DIAGNOSIS — F419 Anxiety disorder, unspecified: Secondary | ICD-10-CM

## 2016-09-13 DIAGNOSIS — R42 Dizziness and giddiness: Secondary | ICD-10-CM

## 2016-09-13 DIAGNOSIS — N3941 Urge incontinence: Secondary | ICD-10-CM

## 2016-09-13 DIAGNOSIS — Z23 Encounter for immunization: Secondary | ICD-10-CM | POA: Diagnosis not present

## 2016-09-13 DIAGNOSIS — M1712 Unilateral primary osteoarthritis, left knee: Secondary | ICD-10-CM

## 2016-09-13 DIAGNOSIS — F418 Other specified anxiety disorders: Secondary | ICD-10-CM

## 2016-09-13 DIAGNOSIS — F32A Depression, unspecified: Secondary | ICD-10-CM

## 2016-09-13 LAB — POCT URINALYSIS DIP (MANUAL ENTRY)
Blood, UA: NEGATIVE
LEUKOCYTES UA: NEGATIVE
Nitrite, UA: NEGATIVE
Spec Grav, UA: 1.03
Urobilinogen, UA: 0.2
pH, UA: 5

## 2016-09-13 LAB — POCT GLYCOSYLATED HEMOGLOBIN (HGB A1C): Hemoglobin A1C: 10.4

## 2016-09-13 LAB — GLUCOSE, POCT (MANUAL RESULT ENTRY): POC Glucose: 201 mg/dl — AB (ref 70–99)

## 2016-09-13 MED ORDER — OMEPRAZOLE 20 MG PO CPDR: 20.0000 mg | DELAYED_RELEASE_CAPSULE | Freq: Every day | ORAL | 3 refills | Status: DC

## 2016-09-13 MED ORDER — MONTELUKAST SODIUM 10 MG PO TABS: 10.0000 mg | ORAL_TABLET | Freq: Every day | ORAL | 3 refills | Status: DC

## 2016-09-13 MED ORDER — ZOSTER VAC RECOMB ADJUVANTED 50 MCG/0.5ML IM SUSR: 1.0000 | Freq: Once | INTRAMUSCULAR | 1 refills | Status: DC

## 2016-09-13 MED ORDER — ZOSTER VAC RECOMB ADJUVANTED 50 MCG/0.5ML IM SUSR: 1.0000 | Freq: Once | INTRAMUSCULAR | 1 refills | Status: AC

## 2016-09-13 MED ORDER — PRAVASTATIN SODIUM 40 MG PO TABS: 40.0000 mg | ORAL_TABLET | Freq: Every day | ORAL | 3 refills | Status: DC

## 2016-09-13 MED ORDER — GLIMEPIRIDE 4 MG PO TABS: 4.0000 mg | ORAL_TABLET | Freq: Two times a day (BID) | ORAL | 3 refills | Status: DC

## 2016-09-13 MED ORDER — SERTRALINE HCL 100 MG PO TABS: 100.0000 mg | ORAL_TABLET | Freq: Every day | ORAL | 1 refills | Status: DC

## 2016-09-13 MED ORDER — METFORMIN HCL 1000 MG PO TABS: 1000.0000 mg | ORAL_TABLET | Freq: Two times a day (BID) | ORAL | 3 refills | Status: DC

## 2016-09-13 MED ORDER — BUDESONIDE-FORMOTEROL FUMARATE 80-4.5 MCG/ACT IN AERO: 2.0000 | INHALATION_SPRAY | Freq: Two times a day (BID) | RESPIRATORY_TRACT | 5 refills | Status: DC

## 2016-09-13 MED ORDER — LISINOPRIL 20 MG PO TABS: 20.0000 mg | ORAL_TABLET | Freq: Every day | ORAL | 3 refills | Status: DC

## 2016-09-13 MED ORDER — TRAMADOL HCL 50 MG PO TABS: 50.0000 mg | ORAL_TABLET | Freq: Four times a day (QID) | ORAL | 5 refills | Status: DC | PRN

## 2016-09-13 MED ORDER — LIRAGLUTIDE 18 MG/3ML ~~LOC~~ SOPN: 1.2000 mg | PEN_INJECTOR | Freq: Every day | SUBCUTANEOUS | 5 refills | Status: DC

## 2016-09-13 MED ORDER — MOMETASONE FUROATE 50 MCG/ACT NA SUSP: 2.0000 | Freq: Every day | NASAL | 12 refills | Status: DC

## 2016-09-13 MED ORDER — GABAPENTIN 300 MG PO CAPS: 300.0000 mg | ORAL_CAPSULE | Freq: Two times a day (BID) | ORAL | 1 refills | Status: DC

## 2016-09-13 NOTE — Patient Instructions (Addendum)
   IF you received an x-ray today, you will receive an invoice from [facility]. Please contact [facility] at [phone] with questions or concerns regarding your invoice.   IF you received labwork today, you will receive an invoice from [facility]. Please contact [facility] at [phone] with questions or concerns regarding your invoice.   Our billing staff will not be able to assist you with questions regarding bills from these companies.  You will be contacted with the lab results as soon as they are available. The fastest way to get your results is to activate your My Chart account. Instructions are located on the last page of this paperwork. If you have not heard from us regarding the results in 2 weeks, please contact this office.      Carbohydrate Counting for Diabetes Mellitus, Adult Carbohydrate counting is a method for keeping track of how many carbohydrates you eat. Eating carbohydrates naturally increases the amount of sugar (glucose) in the blood. Counting how many carbohydrates you eat helps keep your blood glucose within normal limits, which helps you manage your diabetes (diabetes mellitus). It is important to know how many carbohydrates you can safely have in each meal. This is different for every person. A diet and nutrition specialist (registered dietitian) can help you make a meal plan and calculate how many carbohydrates you should have at each meal and snack. Carbohydrates are found in the following foods:  Grains, such as breads and cereals.  Dried beans and soy products.  Starchy vegetables, such as potatoes, peas, and corn.  Fruit and fruit juices.  Milk and yogurt.  Sweets and snack foods, such as cake, cookies, candy, chips, and soft drinks. How do I count carbohydrates? There are two ways to count carbohydrates in food. You can use either of the methods or a combination of both. Reading "Nutrition Facts" on packaged food  The  "Nutrition Facts" list is included on the labels of almost all packaged foods and beverages in the [place] It includes:  The serving size.  Information about nutrients in each serving, including the grams (g) of carbohydrate per serving. To use the "Nutrition Facts":  Decide how many servings you will have.  Multiply the number of servings by the number of carbohydrates per serving.  The resulting number is the total amount of carbohydrates that you will be having. Learning standard serving sizes of other foods  When you eat foods containing carbohydrates that are not packaged or do not include "Nutrition Facts" on the label, you need to measure the servings in order to count the amount of carbohydrates:  Measure the foods that you will eat with a food scale or measuring cup, if needed.  Decide how many standard-size servings you will eat.  Multiply the number of servings by 15. Most carbohydrate-rich foods have about 15 g of carbohydrates per serving.  For example, if you eat 8 oz (170 g) of strawberries, you will have eaten 2 servings and 30 g of carbohydrates (2 servings x 15 g = 30 g).  For foods that have more than one food mixed, such as soups and casseroles, you must count the carbohydrates in each food that is included. The following list contains standard serving sizes of common carbohydrate-rich foods. Each of these servings has about 15 g of carbohydrates:   hamburger bun or  English muffin.   oz (15 mL) syrup.   oz (14 g) jelly.  1 slice of bread.  1 six-inch tortilla.  3   oz (85 g) cooked rice or pasta.  4 oz (113 g) cooked dried beans.  4 oz (113 g) starchy vegetable, such as peas, corn, or potatoes.  4 oz (113 g) hot cereal.  4 oz (113 g) mashed potatoes or  of a large baked potato.  4 oz (113 g) canned or frozen fruit.  4 oz (120 mL) fruit juice.  4-6 crackers.  6 chicken nuggets.  6 oz (170 g) unsweetened dry cereal.  6 oz (170 g) plain  fat-free yogurt or yogurt sweetened with artificial sweeteners.  8 oz (240 mL) milk.  8 oz (170 g) fresh fruit or one small piece of fruit.  24 oz (680 g) popped popcorn. Example of carbohydrate counting Sample meal   3 oz (85 g) chicken breast.  6 oz (170 g) brown rice.  4 oz (113 g) corn.  8 oz (240 mL) milk.  8 oz (170 g) strawberries with sugar-free whipped topping. Carbohydrate calculation  1. Identify the foods that contain carbohydrates:  Rice.  Corn.  Milk.  Strawberries. 2. Calculate how many servings you have of each food:  2 servings rice.  1 serving corn.  1 serving milk.  1 serving strawberries. 3. Multiply each number of servings by 15 g:  2 servings rice x 15 g = 30 g.  1 serving corn x 15 g = 15 g.  1 serving milk x 15 g = 15 g.  1 serving strawberries x 15 g = 15 g. 4. Add together all of the amounts to find the total grams of carbohydrates eaten:  30 g + 15 g + 15 g + 15 g = 75 g of carbohydrates total. This information is not intended to replace advice given to you by your health care provider. Make sure you discuss any questions you have with your health care provider. Document Released: 06/18/2005 Document Revised: 01/06/2016 Document Reviewed: 11/30/2015 Elsevier Interactive Patient Education  2017 [employer]  

## 2016-09-13 NOTE — Progress Notes (Signed)
Subjective:    Patient ID: Daniel [name], male    DOB: 03-30-1951, 66 y.o.   MRN: [id number]  09/13/2016  Diabetes and Medication Refill (Glimepiride 4mg , Lisinopril 20 mg, Pravachol 40 mg, Metformin 1000 mg)   HPI This 66 y.o. male presents for four month follow-up of DMII, hypertension, hypercholesterolemia, GERD, allergic rhinitis.  Januvia increased to $400. Patient reports good compliance with medication, good tolerance to medication, and good symptom control.  Not checking blood pressure at home; not checking sugars at home.     Anxiety and depression with Stressors:  Daughter moved to [place] last week; plan to move into daughter's home.  Not happy with marriage.  Church is very successful.  Daughter in law committed suicide. Indifferent to life other than church.  Son in MVA due to deer.  Son has not worked in 35 months; cannot support him financially any longer.  Ran out of Glimepiride in 3 weeks.    L OA knee.  Suffering with neuropathy in feet and hands.  Increased Gabapentin to bid. Sugars are not good.  Fasting sugars 185-235. Did suffer with R shoulder pain for a while. Suffering with physical pain.  Urge incontinence intermittently for past 1.5 years.  No dysuria.  No hematuria.   Immunization History  Administered Date(s) Administered  . Influenza,inj,Quad PF,36+ Mos 05/03/2015, 02/28/2016  . Pneumococcal Conjugate-13 09/13/2016  . Pneumococcal Polysaccharide-23 07/19/2010  . Tdap 03/14/2006   BP Readings from Last 3 Encounters:  10/11/16 123/74  09/13/16 140/70  02/28/16 (!) 142/80   Wt Readings from Last 3 Encounters:  10/11/16 242 lb (109.8 kg)  10/03/16 242 lb (109.8 kg)  09/13/16 240 lb 9.6 oz (109.1 kg)     Review of Systems  Constitutional: Negative for activity change, appetite change, chills, diaphoresis, fatigue and fever.  Respiratory: Negative for cough and shortness of breath.   Cardiovascular: Negative for chest pain, palpitations and leg swelling.    Gastrointestinal: Negative for abdominal pain, diarrhea, nausea and vomiting.  Endocrine: Negative for cold intolerance, heat intolerance, polydipsia, polyphagia and polyuria.  Genitourinary: Positive for difficulty urinating, frequency and urgency. Negative for discharge, dysuria, enuresis, flank pain, genital sores, hematuria, penile pain, penile swelling, scrotal swelling and testicular pain.  Musculoskeletal: Positive for arthralgias.  Skin: Negative for color change, rash and wound.  Neurological: Positive for dizziness and numbness. Negative for tremors, seizures, syncope, facial asymmetry, speech difficulty, weakness, light-headedness and headaches.  Psychiatric/Behavioral: Positive for dysphoric mood. Negative for sleep disturbance. The patient is not nervous/anxious.     Past Medical History:  Diagnosis Date  . Allergic rhinitis   . Allergy   . Anxiety   . Arthritis   . Benign hypertension   . Cataract   . Depression   . Diabetes mellitus ([place])   . History of pneumonia   . HLD (hyperlipidemia)   . Neuromuscular disorder [facility])    Past Surgical History:  Procedure Laterality Date  . CATARACT EXTRACTION     bilateral  . EYE SURGERY    . KNEE SURGERY     right   Allergies  Allergen Reactions  . Actos [Pioglitazone] Other (See Comments), Shortness Of Breath and Swelling    Blood sugar went up and down and sweating profusely  . Codeine Nausea Only and Palpitations    Palpitations, SOB    Social History   Social History  . Marital status: Married    Spouse name: N/A  . Number of children: 3  . Years  of education: N/A   Occupational History  . tile Chief Strategy Officer, Company secretary    Social History Main Topics  . Smoking status: Never Smoker  . Smokeless tobacco: Never Used  . Alcohol use No  . Drug use: No  . Sexual activity: Not on file   Other Topics Concern  . Not on file   Social History Narrative  . No narrative on file   Family History  Problem Relation  Age of Onset  . Diabetes Father   . Colon polyps Father   . Atrial fibrillation Mother   . Prostate cancer Paternal Uncle   . Esophageal cancer Neg Hx   . Rectal cancer Neg Hx   . Stomach cancer Neg Hx        Objective:    BP 140/70 (BP Location: Left Arm, Cuff Size: Normal)   Pulse 80   Temp 98.5 F (36.9 C) (Oral)   Resp 16   Ht 5\' 8"  (1.727 m)   Wt 240 lb 9.6 oz (109.1 kg)   SpO2 98%   BMI 36.58 kg/m  Physical Exam  Constitutional: He is oriented to person, place, and time. He appears well-developed and well-nourished. No distress.  HENT:  Head: Normocephalic and atraumatic.  Right Ear: External ear normal.  Left Ear: External ear normal.  Nose: Nose normal.  Mouth/Throat: Oropharynx is clear and moist.  Eyes: Conjunctivae and EOM are normal. Pupils are equal, round, and reactive to light.  Neck: Normal range of motion. Neck supple. Carotid bruit is not present. No thyromegaly present.  Cardiovascular: Normal rate, regular rhythm, normal heart sounds and intact distal pulses.  Exam reveals no gallop and no friction rub.   No murmur heard. Pulmonary/Chest: Effort normal and breath sounds normal. He has no wheezes. He has no rales.  Abdominal: Soft. Bowel sounds are normal. He exhibits no distension and no mass. There is no tenderness. There is no rebound and no guarding.  Lymphadenopathy:    He has no cervical adenopathy.  Neurological: He is alert and oriented to person, place, and time. No cranial nerve deficit.  Skin: Skin is warm and dry. No rash noted. He is not diaphoretic.  Psychiatric: He has a normal mood and affect. His behavior is normal.  Nursing note and vitals reviewed.  Depression screen [facility] 2/9 09/13/2016 02/28/2016 07/25/2015 05/03/2015 02/11/2015  Decreased Interest 0 0 0 0 0  Down, Depressed, Hopeless 0 0 0 0 0  PHQ - 2 Score 0 0 0 0 0   Fall Risk  09/13/2016 02/28/2016 07/25/2015 05/03/2015  Falls in the past year? No No Yes Yes  Number falls in past  yr: - - 2 or more 1  Injury with Fall? - - Yes No        Assessment & Plan:   1. Type 2 diabetes mellitus with diabetic polyneuropathy, without long-term current use of insulin ([place])   2. Essential hypertension   3. Hypercholesteremia   4. Urge incontinence   5. Primary osteoarthritis of left knee   6. Dizziness   7. Screening for prostate cancer   8. Need for prophylactic vaccination against Streptococcus pneumoniae (pneumococcus)   9. Need for shingles vaccine   10. Anxiety and depression   11. Gastroesophageal reflux disease without esophagitis   12. Seasonal allergic rhinitis due to pollen    -diabetes uncontrolled; add Victoza.  Obtain labs. -recent dizziness most likely due to elevated sugars; recommend checking sugars daily; recommend checking blood pressure daily and when acutely  dizzy; obtain EKG and labs. Normal neurological exam;not suggestive of BPV. -urge incontinence present; obtain PSA and urine culture.  Elevated sugars likely contributing. -s/p Prevnar 13; rx for Shingrix provided.   Orders Placed This Encounter  Procedures  . Urine culture  . Pneumococcal conjugate vaccine 13-valent IM  . CBC with Differential/Platelet  . Comprehensive metabolic panel    Order Specific Question:   Has the patient fasted?    Answer:   Yes  . Lipid panel    Order Specific Question:   Has the patient fasted?    Answer:   Yes  . TSH  . Microalbumin, urine  . PSA  . POCT glycosylated hemoglobin (Hb A1C)  . POCT glucose (manual entry)  . POCT urinalysis dipstick  . EKG 12-Lead  . HM Diabetes Foot Exam   Meds ordered this encounter  Medications  . glucose blood (ONE TOUCH ULTRA TEST) test strip    Sig: Check glucose twice daily.  Marland Kitchen glucose blood (ONE TOUCH ULTRA TEST) test strip    Sig: Test Twice a day as directed  . budesonide-formoterol (SYMBICORT) 80-4.5 MCG/ACT inhaler    Sig: Inhale 2 puffs into the lungs 2 (two) times daily.    Dispense:  1 Inhaler    Refill:   5  . gabapentin (NEURONTIN) 300 MG capsule    Sig: Take 1 capsule (300 mg total) by mouth 2 (two) times daily.    Dispense:  180 capsule    Refill:  1  . glimepiride (AMARYL) 4 MG tablet    Sig: Take 1 tablet (4 mg total) by mouth 2 (two) times daily.    Dispense:  180 tablet    Refill:  3  . lisinopril (PRINIVIL,ZESTRIL) 20 MG tablet    Sig: Take 1 tablet (20 mg total) by mouth daily.    Dispense:  90 tablet    Refill:  3  . metFORMIN (GLUCOPHAGE) 1000 MG tablet    Sig: Take 1 tablet (1,000 mg total) by mouth 2 (two) times daily with a meal.    Dispense:  180 tablet    Refill:  3  . mometasone (NASONEX) 50 MCG/ACT nasal spray    Sig: Place 2 sprays into the nose daily.    Dispense:  17 g    Refill:  12  . montelukast (SINGULAIR) 10 MG tablet    Sig: Take 1 tablet (10 mg total) by mouth at bedtime.    Dispense:  90 tablet    Refill:  3  . omeprazole (PRILOSEC) 20 MG capsule    Sig: Take 1 capsule (20 mg total) by mouth daily.    Dispense:  90 capsule    Refill:  3  . pravastatin (PRAVACHOL) 40 MG tablet    Sig: Take 1 tablet (40 mg total) by mouth daily.    Dispense:  90 tablet    Refill:  3  . sertraline (ZOLOFT) 100 MG tablet    Sig: Take 1 tablet (100 mg total) by mouth at bedtime.    Dispense:  90 tablet    Refill:  1  . traMADol (ULTRAM) 50 MG tablet    Sig: Take 1 tablet (50 mg total) by mouth every 6 (six) hours as needed.    Dispense:  120 tablet    Refill:  5  . DISCONTD: Zoster Vac Recomb Adjuvanted (SHINGRIX) 50 MCG SUSR    Sig: Inject 1 Dose into the muscle once.    Dispense:  1 each  Refill:  1  . Zoster Vac Recomb Adjuvanted (SHINGRIX) 50 MCG SUSR    Sig: Inject 1 Dose into the muscle once.    Dispense:  1 each    Refill:  1  . liraglutide 18 MG/3ML SOPN    Sig: Inject 0.2 mLs (1.2 mg total) into the skin daily. Inject 0.6mg  daily for two weeks; then increase 1.2mg     Dispense:  3 mL    Refill:  5    Return in about 3 months (around 12/14/2016)  for complete physical examiniation.   Jaslynne Dahan Elayne Guerin, M.D. Primary Care at [facility] previously [facility] [address] [place], [place]  [phone] phone [phone] fax

## 2016-09-14 LAB — CBC WITH DIFFERENTIAL/PLATELET
BASOS ABS: 0.1 10*3/uL (ref 0.0–0.2)
BASOS: 1 %
EOS (ABSOLUTE): 0.2 10*3/uL (ref 0.0–0.4)
Eos: 2 %
Hematocrit: 37.5 % (ref 37.5–51.0)
Hemoglobin: 12.1 g/dL — ABNORMAL LOW (ref 13.0–17.7)
IMMATURE GRANS (ABS): 0 10*3/uL (ref 0.0–0.1)
IMMATURE GRANULOCYTES: 0 %
LYMPHS: 26 %
Lymphocytes Absolute: 2.5 10*3/uL (ref 0.7–3.1)
MCH: 29.6 pg (ref 26.6–33.0)
MCHC: 32.3 g/dL (ref 31.5–35.7)
MCV: 92 fL (ref 79–97)
MONOS ABS: 0.7 10*3/uL (ref 0.1–0.9)
Monocytes: 7 %
Neutrophils Absolute: 6 10*3/uL (ref 1.4–7.0)
Neutrophils: 64 %
PLATELETS: 334 10*3/uL (ref 150–379)
RBC: 4.09 x10E6/uL — ABNORMAL LOW (ref 4.14–5.80)
RDW: 13.2 % (ref 12.3–15.4)
WBC: 9.4 10*3/uL (ref 3.4–10.8)

## 2016-09-14 LAB — COMPREHENSIVE METABOLIC PANEL
A/G RATIO: 1.8 (ref 1.2–2.2)
ALK PHOS: 98 IU/L (ref 39–117)
ALT: 30 IU/L (ref 0–44)
AST: 27 IU/L (ref 0–40)
Albumin: 4.4 g/dL (ref 3.6–4.8)
BUN/Creatinine Ratio: 16 (ref 10–24)
BUN: 15 mg/dL (ref 8–27)
Bilirubin Total: 0.2 mg/dL (ref 0.0–1.2)
CHLORIDE: 92 mmol/L — AB (ref 96–106)
CO2: 21 mmol/L (ref 18–29)
CREATININE: 0.96 mg/dL (ref 0.76–1.27)
Calcium: 9.5 mg/dL (ref 8.6–10.2)
GFR calc Af Amer: 94 mL/min/{1.73_m2} (ref >59)
GFR calc non Af Amer: 81 mL/min/{1.73_m2} (ref >59)
GLOBULIN, TOTAL: 2.5 g/dL (ref 1.5–4.5)
Glucose: 252 mg/dL — ABNORMAL HIGH (ref 65–99)
POTASSIUM: 5.3 mmol/L — AB (ref 3.5–5.2)
SODIUM: 135 mmol/L (ref 134–144)
Total Protein: 6.9 g/dL (ref 6.0–8.5)

## 2016-09-14 LAB — PSA: PROSTATE SPECIFIC AG, SERUM: 0.3 ng/mL (ref 0.0–4.0)

## 2016-09-14 LAB — TSH: TSH: 2.35 u[IU]/mL (ref 0.450–4.500)

## 2016-09-14 LAB — MICROALBUMIN, URINE: Microalbumin, Urine: 5.4 ug/mL

## 2016-09-14 LAB — LIPID PANEL
CHOLESTEROL TOTAL: 199 mg/dL (ref 100–199)
Chol/HDL Ratio: 3.3 ratio units (ref 0.0–5.0)
HDL: 61 mg/dL (ref >39)
LDL Calculated: 112 mg/dL — ABNORMAL HIGH (ref 0–99)
TRIGLYCERIDES: 130 mg/dL (ref 0–149)
VLDL Cholesterol Cal: 26 mg/dL (ref 5–40)

## 2016-09-15 LAB — URINE CULTURE: ORGANISM ID, BACTERIA: NO GROWTH

## 2016-09-19 ENCOUNTER — Encounter: Payer: Self-pay | Admitting: Family Medicine

## 2016-09-20 ENCOUNTER — Telehealth: Payer: Self-pay | Admitting: Family Medicine

## 2016-09-20 DIAGNOSIS — Z1211 Encounter for screening for malignant neoplasm of colon: Secondary | ICD-10-CM

## 2016-09-20 NOTE — Telephone Encounter (Signed)
Pt was told to call back to talk with dr Tamala Julian about when he had his last colonoscopy and he states that he can not locate the date but it was [facility] that did the procedure   Best number [phone]

## 2016-09-21 NOTE — Telephone Encounter (Signed)
I see 12/27/05 under procedures in chart

## 2016-09-21 NOTE — Telephone Encounter (Signed)
I also saw that in the chart at time of visit; pt felt he had undergone a more recent colonoscopy.  He is now overdue for colonoscopy.  Please call patient --- I see his [place] colonoscopy in 2007. He is one year overdue for repeat.  I have placed referral for repeat colonoscopy.

## 2016-09-25 ENCOUNTER — Encounter: Payer: Self-pay | Admitting: Gastroenterology

## 2016-10-03 ENCOUNTER — Encounter: Payer: Self-pay | Admitting: Gastroenterology

## 2016-10-03 ENCOUNTER — Ambulatory Visit (AMBULATORY_SURGERY_CENTER): Payer: Self-pay

## 2016-10-03 VITALS — Ht 68.0 in | Wt 242.0 lb

## 2016-10-03 DIAGNOSIS — Z1211 Encounter for screening for malignant neoplasm of colon: Secondary | ICD-10-CM

## 2016-10-03 MED ORDER — NA SULFATE-K SULFATE-MG SULF 17.5-3.13-1.6 GM/177ML PO SOLN: 1.0000 | Freq: Once | ORAL | 0 refills | Status: AC

## 2016-10-03 NOTE — Progress Notes (Signed)
Denies allergies to eggs or soy products. Denies complication of anesthesia or sedation. Denies use of weight loss medication. Denies use of O2.   Emmi instructions given for colonoscopy.  

## 2016-10-11 ENCOUNTER — Encounter: Payer: Self-pay | Admitting: Gastroenterology

## 2016-10-11 ENCOUNTER — Ambulatory Visit (AMBULATORY_SURGERY_CENTER): Payer: Medicare Other | Admitting: Gastroenterology

## 2016-10-11 VITALS — BP 123/74 | HR 66 | Temp 97.3°F | Resp 17 | Ht 68.0 in | Wt 242.0 lb

## 2016-10-11 DIAGNOSIS — Z1211 Encounter for screening for malignant neoplasm of colon: Secondary | ICD-10-CM

## 2016-10-11 DIAGNOSIS — D127 Benign neoplasm of rectosigmoid junction: Secondary | ICD-10-CM

## 2016-10-11 DIAGNOSIS — D122 Benign neoplasm of ascending colon: Secondary | ICD-10-CM

## 2016-10-11 DIAGNOSIS — D123 Benign neoplasm of transverse colon: Secondary | ICD-10-CM | POA: Diagnosis not present

## 2016-10-11 DIAGNOSIS — D126 Benign neoplasm of colon, unspecified: Secondary | ICD-10-CM | POA: Diagnosis not present

## 2016-10-11 DIAGNOSIS — Z1212 Encounter for screening for malignant neoplasm of rectum: Secondary | ICD-10-CM

## 2016-10-11 DIAGNOSIS — K635 Polyp of colon: Secondary | ICD-10-CM | POA: Diagnosis not present

## 2016-10-11 MED ORDER — SODIUM CHLORIDE 0.9 % IV SOLN: 500.0000 mL | INTRAVENOUS | Status: DC

## 2016-10-11 NOTE — Progress Notes (Signed)
Called to room to assist during endoscopic procedure.  Patient ID and intended procedure confirmed with present staff. Received instructions for my participation in the procedure from the performing physician.  

## 2016-10-11 NOTE — Patient Instructions (Signed)
Impression/Recommendations:  Polyp handout given to patient. Diverticulosis handout given to patient. Hemorrhoid handout given to patient.  No Ibuprofen, naproxen, or other NSAID drugs for 2 weeks.  Tylenol only until 10/26/2016.  Repeat colonoscopy for surveillance.   Date to be determind after pathology results reviewed.  YOU HAD AN ENDOSCOPIC PROCEDURE TODAY AT [facility]:   Refer to the procedure report that was given to you for any specific questions about what was found during the examination.  If the procedure report does not answer your questions, please call your gastroenterologist to clarify.  If you requested that your care partner not be given the details of your procedure findings, then the procedure report has been included in a sealed envelope for you to review at your convenience later.  YOU SHOULD EXPECT: Some feelings of bloating in the abdomen. Passage of more gas than usual.  Walking can help get rid of the air that was put into your GI tract during the procedure and reduce the bloating. If you had a lower endoscopy (such as a colonoscopy or flexible sigmoidoscopy) you may notice spotting of blood in your stool or on the toilet paper. If you underwent a bowel prep for your procedure, you may not have a normal bowel movement for a few days.  Please Note:  You might notice some irritation and congestion in your nose or some drainage.  This is from the oxygen used during your procedure.  There is no need for concern and it should clear up in a day or so.  SYMPTOMS TO REPORT IMMEDIATELY:   Following lower endoscopy (colonoscopy or flexible sigmoidoscopy):  Excessive amounts of blood in the stool  Significant tenderness or worsening of abdominal pains  Swelling of the abdomen that is new, acute  Fever of 100F or higher  For urgent or emergent issues, a gastroenterologist can be reached at any hour by calling [phone].   DIET:  We do recommend a small  meal at first, but then you may proceed to your regular diet.  Drink plenty of fluids but you should avoid alcoholic beverages for 24 hours.  ACTIVITY:  You should plan to take it easy for the rest of today and you should NOT DRIVE or use heavy machinery until tomorrow (because of the sedation medicines used during the test).    FOLLOW UP: Our staff will call the number listed on your records the next business day following your procedure to check on you and address any questions or concerns that you may have regarding the information given to you following your procedure. If we do not reach you, we will leave a message.  However, if you are feeling well and you are not experiencing any problems, there is no need to return our call.  We will assume that you have returned to your regular daily activities without incident.  If any biopsies were taken you will be contacted by phone or by letter within the next 1-3 weeks.  Please call us at [phone] if you have not heard about the biopsies in 3 weeks.    SIGNATURES/CONFIDENTIALITY: You and/or your care partner have signed paperwork which will be entered into your electronic medical record.  These signatures attest to the fact that that the information above on your After Visit Summary has been reviewed and is understood.  Full responsibility of the confidentiality of this discharge information lies with you and/or your care-partner.

## 2016-10-11 NOTE — Op Note (Signed)
[facility] Patient Name: Daniel [name] Procedure Date: 10/11/2016 9:08 AM MRN: [id number] Endoscopist: Remo Lipps P. Armbruster MD, MD Age: 66 Referring MD:  Date of Birth: 1950/08/25 Gender: Male Account #: [id number] Procedure:                Colonoscopy Indications:              Screening for colorectal malignant neoplasm Medicines:                Monitored Anesthesia Care Procedure:                Pre-Anesthesia Assessment:                           - Prior to the procedure, a History and Physical                            was performed, and patient medications and                            allergies were reviewed. The patient's tolerance of                            previous anesthesia was also reviewed. The risks                            and benefits of the procedure and the sedation                            options and risks were discussed with the patient.                            All questions were answered, and informed consent                            was obtained. Prior Anticoagulants: The patient has                            taken aspirin, last dose was 1 day prior to                            procedure. ASA Grade Assessment: II - A patient                            with mild systemic disease. After reviewing the                            risks and benefits, the patient was deemed in                            satisfactory condition to undergo the procedure.                           After obtaining informed consent, the colonoscope  was passed under direct vision. Throughout the                            procedure, the patient's blood pressure, pulse, and                            oxygen saturations were monitored continuously. The                            Colonoscope was introduced through the anus and                            advanced to the the cecum, identified by                            appendiceal orifice and  ileocecal valve. The                            colonoscopy was performed without difficulty. The                            patient tolerated the procedure well. The quality                            of the bowel preparation was good. The ileocecal                            valve, appendiceal orifice, and rectum were                            photographed. Scope In: 9:19:03 AM Scope Out: 9:44:36 AM Scope Withdrawal Time: 0 hours 22 minutes 23 seconds  Total Procedure Duration: 0 hours 25 minutes 33 seconds  Findings:                 The perianal and digital rectal examinations were                            normal.                           A 8 mm polyp was found in the ascending colon. The                            polyp was sessile. The polyp was removed with a                            cold snare. Resection and retrieval were complete.                           A 4 mm polyp was found in the ascending colon. The                            polyp was sessile. The polyp was removed with a  cold snare. Resection and retrieval were complete.                           Two sessile polyps were found in the transverse                            colon. The polyps were 3 to 6 mm in size. These                            polyps were removed with a cold snare. Resection                            and retrieval were complete.                           A 5 mm polyp was found in the recto-sigmoid colon.                            The polyp was sessile. The polyp was removed with a                            cold snare. Resection and retrieval were complete.                           A few small-mouthed diverticula were found in the                            left colon and right colon.                           Internal hemorrhoids were found during                            retroflexion. The hemorrhoids were small.                           The colon was spastic  which prolonged the exam. The                            exam was otherwise without abnormality. Complications:            No immediate complications. Estimated blood loss:                            Minimal. Estimated Blood Loss:     Estimated blood loss was minimal. Impression:               - One 8 mm polyp in the ascending colon, removed                            with a cold snare. Resected and retrieved.                           - One 4 mm polyp in the ascending  colon, removed                            with a cold snare. Resected and retrieved.                           - Two 3 to 6 mm polyps in the transverse colon,                            removed with a cold snare. Resected and retrieved.                           - One 5 mm polyp at the recto-sigmoid colon,                            removed with a cold snare. Resected and retrieved.                           - Diverticulosis in the left colon and in the right                            colon.                           - Internal hemorrhoids.                           - Spastic colon                           - The examination was otherwise normal. Recommendation:           - Patient has a contact number available for                            emergencies. The signs and symptoms of potential                            delayed complications were discussed with the                            patient. Return to normal activities tomorrow.                            Written discharge instructions were provided to the                            patient.                           - Resume previous diet.                           - Continue present medications.                           -  No ibuprofen, naproxen, or other non-steroidal                            anti-inflammatory drugs for 2 weeks after polyp                            removal.                           - Await pathology results.                           -  Repeat colonoscopy is recommended for                            surveillance. The colonoscopy date will be                            determined after pathology results from today's                            exam become available for review. Remo Lipps P. Armbruster MD, MD 10/11/2016 9:49:24 AM This report has been signed electronically.

## 2016-10-11 NOTE — Progress Notes (Signed)
Report to PACU, RN, vss, BBS= Clear.  

## 2016-10-11 NOTE — Progress Notes (Signed)
Pt's states no medical or surgical changes since previsit or office visit. 

## 2016-10-12 ENCOUNTER — Telehealth: Payer: Self-pay

## 2016-10-12 NOTE — Telephone Encounter (Signed)
  Follow up Call-  Call back number 10/11/2016  Post procedure Call Back phone  # [phone]  Permission to leave phone message Yes  Some recent data might be hidden     Patient questions:  Do you have a fever, pain , or abdominal swelling? No. Pain Score  0 *  Have you tolerated food without any problems? Yes.    Have you been able to return to your normal activities? Yes.    Do you have any questions about your discharge instructions: Diet   No. Medications  No. Follow up visit  No.  Do you have questions or concerns about your Care? No.  Actions: * If pain score is 4 or above: No action needed, pain <4.

## 2016-10-17 ENCOUNTER — Encounter: Payer: Self-pay | Admitting: Gastroenterology

## 2016-10-28 ENCOUNTER — Encounter: Payer: Self-pay | Admitting: Family Medicine

## 2016-10-28 DIAGNOSIS — F32A Depression, unspecified: Secondary | ICD-10-CM | POA: Insufficient documentation

## 2016-10-28 DIAGNOSIS — J301 Allergic rhinitis due to pollen: Secondary | ICD-10-CM | POA: Insufficient documentation

## 2016-10-28 DIAGNOSIS — F419 Anxiety disorder, unspecified: Secondary | ICD-10-CM

## 2016-10-28 DIAGNOSIS — K219 Gastro-esophageal reflux disease without esophagitis: Secondary | ICD-10-CM | POA: Insufficient documentation

## 2016-10-28 DIAGNOSIS — F329 Major depressive disorder, single episode, unspecified: Secondary | ICD-10-CM | POA: Insufficient documentation

## 2016-12-26 ENCOUNTER — Other Ambulatory Visit: Payer: Self-pay | Admitting: Family Medicine

## 2017-03-13 ENCOUNTER — Other Ambulatory Visit: Payer: Self-pay | Admitting: Family Medicine

## 2017-03-14 NOTE — Telephone Encounter (Signed)
Pt has apt on 04/08/17

## 2017-03-14 NOTE — Telephone Encounter (Signed)
Call --- patient is due for six month follow-up for DMII, high blood pressure, pain.  I approved refill of Tramadol for one month only.  Please schedule OV with me in upcoming month.  He should be fasting for visit.

## 2017-04-08 ENCOUNTER — Ambulatory Visit (INDEPENDENT_AMBULATORY_CARE_PROVIDER_SITE_OTHER): Payer: Medicare Other | Admitting: Family Medicine

## 2017-04-08 ENCOUNTER — Encounter: Payer: Self-pay | Admitting: Family Medicine

## 2017-04-08 VITALS — BP 140/82 | HR 89 | Temp 98.0°F | Resp 16 | Ht 67.72 in | Wt 233.0 lb

## 2017-04-08 DIAGNOSIS — F419 Anxiety disorder, unspecified: Secondary | ICD-10-CM

## 2017-04-08 DIAGNOSIS — E7800 Pure hypercholesterolemia, unspecified: Secondary | ICD-10-CM

## 2017-04-08 DIAGNOSIS — Z23 Encounter for immunization: Secondary | ICD-10-CM | POA: Diagnosis not present

## 2017-04-08 DIAGNOSIS — I10 Essential (primary) hypertension: Secondary | ICD-10-CM | POA: Diagnosis not present

## 2017-04-08 DIAGNOSIS — G5601 Carpal tunnel syndrome, right upper limb: Secondary | ICD-10-CM

## 2017-04-08 DIAGNOSIS — E1142 Type 2 diabetes mellitus with diabetic polyneuropathy: Secondary | ICD-10-CM | POA: Diagnosis not present

## 2017-04-08 DIAGNOSIS — F329 Major depressive disorder, single episode, unspecified: Secondary | ICD-10-CM

## 2017-04-08 DIAGNOSIS — F32A Depression, unspecified: Secondary | ICD-10-CM

## 2017-04-08 MED ORDER — SERTRALINE HCL 100 MG PO TABS: 200.0000 mg | ORAL_TABLET | Freq: Every day | ORAL | 1 refills | Status: DC

## 2017-04-08 MED ORDER — LIRAGLUTIDE 18 MG/3ML ~~LOC~~ SOPN: 1.8000 mg | PEN_INJECTOR | Freq: Every day | SUBCUTANEOUS | 5 refills | Status: DC

## 2017-04-08 MED ORDER — TRAMADOL HCL 50 MG PO TABS: 50.0000 mg | ORAL_TABLET | Freq: Four times a day (QID) | ORAL | 5 refills | Status: DC | PRN

## 2017-04-08 MED ORDER — GABAPENTIN 300 MG PO CAPS: 300.0000 mg | ORAL_CAPSULE | Freq: Four times a day (QID) | ORAL | 1 refills | Status: DC

## 2017-04-08 NOTE — Progress Notes (Signed)
Subjective:    Patient ID: Daniel [name], male    DOB: [birth year], 65 y.o.   MRN: [id number]  04/08/2017  Hypertension (6 month follow-up); Diabetes; and Depression    HPI This 66 y.o. male presents for six month follow-up of DMII, hypertension, hypercholesterolemia.  Management changes at last visit included: -diabetes uncontrolled; add Victoza.  Obtain labs. -recent dizziness most likely due to elevated sugars; recommend checking sugars daily; recommend checking blood pressure daily and when acutely dizzy; obtain EKG and labs. Normal neurological exam;not suggestive of BPV. -urge incontinence present; obtain PSA and urine culture.  Elevated sugars likely contributing. -s/p Prevnar 13; rx for Shingrix   Tired; heat and humidity has been really stressful.  Went to [place] for eight days.  88-92 degrees; 100% humidity.  Sugars have been labile.  Having a lot of anxiety.  Feeling overwhelmed.  Having trouble between Gabapentin and Sertraline and Tramadol.  Having trouble getting up in morning due to groggy; tried to decrease Sertraline but anxiety worsened.  Has been so tired because of physical demands.  Wife wanted to walk a lot.  Physically could not walk a lot.  Lost a lot of weight in [place] due to sweating.  Started Victoza; likes it; must remember it.  Pretty good at remembering it.  Only had Metformin for eight days when in [place].   Fasting sugars ranging 103-180; morning sugars always the highest.  Afternoon readings 130-140s.  Fasting sugars have improved.  With increase stress, comfort eats.   Getting 5-7 hours of sleep per night.  Wife gets up at 4:15am to go to work.  R hand numbness:  Acutely worsening in past two months; R handed; hammering/sawing/setting tile.  1st-4th fingers involved.  Intense pain.  Assumes due to peripheral neuropathy.  Similar symptoms in LEFT hand in the past but now improved.  Eye exam is in two weeks.     BP Readings from Last 3 Encounters:    04/08/17 140/82  10/11/16 123/74  09/13/16 140/70   Wt Readings from Last 3 Encounters:  04/08/17 233 lb (105.7 kg)  10/11/16 242 lb (109.8 kg)  10/03/16 242 lb (109.8 kg)   Immunization History  Administered Date(s) Administered  . Influenza,inj,Quad PF,6+ Mos 05/03/2015, 02/28/2016, 04/08/2017  . Pneumococcal Conjugate-13 09/13/2016  . Pneumococcal Polysaccharide-23 07/19/2010  . Tdap 03/14/2006    Review of Systems  Constitutional: Positive for fatigue. Negative for activity change, appetite change, chills, diaphoresis and fever.  Respiratory: Negative for cough and shortness of breath.   Cardiovascular: Negative for chest pain, palpitations and leg swelling.  Gastrointestinal: Negative for abdominal pain, diarrhea, nausea and vomiting.  Endocrine: Negative for cold intolerance, heat intolerance, polydipsia, polyphagia and polyuria.  Musculoskeletal: Positive for arthralgias and myalgias.  Skin: Negative for color change, rash and wound.  Neurological: Positive for numbness. Negative for dizziness, tremors, seizures, syncope, facial asymmetry, speech difficulty, weakness, light-headedness and headaches.  Psychiatric/Behavioral: Positive for dysphoric mood. Negative for sleep disturbance. The patient is not nervous/anxious.     Past Medical History:  Diagnosis Date  . Allergic rhinitis   . Allergy   . Anxiety   . Arthritis   . Benign hypertension   . Cataract   . Depression   . Diabetes mellitus ([place])   . GERD (gastroesophageal reflux disease)   . History of pneumonia   . HLD (hyperlipidemia)   . Neuromuscular disorder [facility])    Past Surgical History:  Procedure Laterality Date  . CATARACT EXTRACTION  bilateral  . EYE SURGERY    . KNEE SURGERY     right   Allergies  Allergen Reactions  . Actos [Pioglitazone] Other (See Comments), Shortness Of Breath and Swelling    Blood sugar went up and down and sweating profusely  . Codeine Nausea Only and Palpitations     Palpitations, SOB    Social History   Social History  . Marital status: Married    Spouse name: N/A  . Number of children: 3  . Years of education: N/A   Occupational History  . tile Chief Strategy Officer, Company secretary    Social History Main Topics  . Smoking status: Never Smoker  . Smokeless tobacco: Never Used  . Alcohol use No  . Drug use: No  . Sexual activity: Not on file   Other Topics Concern  . Not on file   Social History Narrative  . No narrative on file   Family History  Problem Relation Age of Onset  . Diabetes Father   . Colon polyps Father   . Atrial fibrillation Mother   . Prostate cancer Paternal Uncle   . Esophageal cancer Neg Hx   . Rectal cancer Neg Hx   . Stomach cancer Neg Hx        Objective:    BP 140/82   Pulse 89   Temp 98 F (36.7 C) (Oral)   Resp 16   Ht 5' 7.72" (1.72 m)   Wt 233 lb (105.7 kg)   SpO2 98%   BMI 35.72 kg/m  Physical Exam  Constitutional: He is oriented to person, place, and time. He appears well-developed and well-nourished. No distress.  HENT:  Head: Normocephalic and atraumatic.  Right Ear: External ear normal.  Left Ear: External ear normal.  Nose: Nose normal.  Mouth/Throat: Oropharynx is clear and moist.  Eyes: Pupils are equal, round, and reactive to light. Conjunctivae and EOM are normal.  Neck: Normal range of motion. Neck supple. Carotid bruit is not present. No thyromegaly present.  Cardiovascular: Normal rate, regular rhythm, normal heart sounds and intact distal pulses.  Exam reveals no gallop and no friction rub.   No murmur heard. Pulmonary/Chest: Effort normal and breath sounds normal. He has no wheezes. He has no rales.  Abdominal: Soft. Bowel sounds are normal. He exhibits no distension and no mass. There is no tenderness. There is no rebound and no guarding.  Musculoskeletal:       Right shoulder: Normal.       Right elbow: Normal.      Right wrist: Normal.       Right hand: Normal. Normal sensation  noted. Normal strength noted.  Lymphadenopathy:    He has no cervical adenopathy.  Neurological: He is alert and oriented to person, place, and time. No cranial nerve deficit.  Skin: Skin is warm and dry. No rash noted. He is not diaphoretic.  Psychiatric: He has a normal mood and affect. His behavior is normal.  Nursing note and vitals reviewed.  No results found. Depression screen [facility] 2/9 04/08/2017 09/13/2016 02/28/2016 07/25/2015 05/03/2015  Decreased Interest 0 0 0 0 0  Down, Depressed, Hopeless 0 0 0 0 0  PHQ - 2 Score 0 0 0 0 0   Fall Risk  04/08/2017 09/13/2016 02/28/2016 07/25/2015 05/03/2015  Falls in the past year? No No No Yes Yes  Number falls in past yr: - - - 2 or more 1  Injury with Fall? - - - Yes No  Comment - - -  per patient re-injury to Left knee -        Assessment & Plan:   1. Type 2 diabetes mellitus with diabetic polyneuropathy, without long-term current use of insulin ([place])   2. Essential hypertension   3. Anxiety and depression   4. Need for prophylactic vaccination and inoculation against influenza   5. Hypercholesteremia   6. Carpal tunnel syndrome of right wrist    -uncontrolled DMII with peripheral neuropathy; tolerating Victoza well; increase dose to 1.8 daily.   I recommend weight loss, exercise, and low-carbohydrate low-sugar food choices. You should AVOID: regular sodas, sweetened tea, fruit juices.  You should LIMIT: breads, pastas, rice, potatoes, and desserts/sweets.  I would recommend limiting your total carbohydrate intake per meal to 45 grams; I would limit your total carbohydrate intake per snack to 30 grams.  I would also have a goal of 60 grams of protein intake per day; this would equal 10-15 grams of protein per meal and 5-10 grams of protein per snack. -anxiety and depression uncontrolled; increase Zoloft to 200mg  daily.   -peripheral neuropathy persistent; continue current medications yet increase Gabapentin to qid and consider two capsules qhs.    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   Orders Placed This Encounter  Procedures  . Flu Vaccine QUAD 36+ mos IM  . CBC with Differential/Platelet  . Comprehensive metabolic panel    Order Specific Question:   Has the patient fasted?    Answer:   No  . Lipid panel    Order Specific Question:   Has the patient fasted?    Answer:   No  . Hemoglobin A1c  . TSH  . Care order/instruction:    Please recheck BP.   Meds ordered this encounter  Medications  . gabapentin (NEURONTIN) 300 MG capsule    Sig: Take 1-2 capsules (300-600 mg total) by mouth 4 (four) times daily.    Dispense:  450 capsule    Refill:  1  . sertraline (ZOLOFT) 100 MG tablet    Sig: Take 2 tablets (200 mg total) by mouth at bedtime.    Dispense:  180 tablet    Refill:  1  . liraglutide 18 MG/3ML SOPN    Sig: Inject 0.3 mLs (1.8 mg total) into the skin daily. Inject 0.6mg  daily for two weeks; then increase 1.2mg     Dispense:  3 mL    Refill:  5  . traMADol (ULTRAM) 50 MG tablet    Sig: Take 1 tablet (50 mg total) by mouth every 6 (six) hours as needed.    Dispense:  120 tablet    Refill:  5    Return in about 6 months (around 10/07/2017) for complete physical examiniation.   Greer Wainright Elayne Guerin, M.D. Primary Care at [facility] [address] [place], [place]  [address] [phone] phone [phone] fax

## 2017-04-08 NOTE — Patient Instructions (Addendum)
IF you received an x-ray today, you will receive an invoice from [facility]. Please contact [facility] at [phone] with questions or concerns regarding your invoice.   IF you received labwork today, you will receive an invoice from [facility]. Please contact [facility] at [phone] with questions or concerns regarding your invoice.   Our billing staff will not be able to assist you with questions regarding bills from these companies.  You will be contacted with the lab results as soon as they are available. The fastest way to get your results is to activate your My Chart account. Instructions are located on the last page of this paperwork. If you have not heard from [place] regarding the results in 2 weeks, please contact this office.      Carpal Tunnel Syndrome Carpal tunnel syndrome is a condition that causes pain in your hand and arm. The carpal tunnel is a narrow area located on the palm side of your wrist. Repeated wrist motion or certain diseases may cause swelling within the tunnel. This swelling pinches the main nerve in the wrist (median nerve). What are the causes? This condition may be caused by:  Repeated wrist motions.  Wrist injuries.  Arthritis.  A cyst or tumor in the carpal tunnel.  Fluid buildup during pregnancy.  Sometimes the cause of this condition is not known. What increases the risk? This condition is more likely to develop in:  People who have jobs that cause them to repeatedly move their wrists in the same motion, such as Art gallery manager.  Women.  People with certain conditions, such as: ? Diabetes. ? Obesity. ? An underactive thyroid (hypothyroidism). ? Kidney failure.  What are the signs or symptoms? Symptoms of this condition include:  A tingling feeling in your fingers, especially in your thumb, index, and middle fingers.  Tingling or numbness in your hand.  An aching feeling in your entire arm, especially  when your wrist and elbow are bent for long periods of time.  Wrist pain that goes up your arm to your shoulder.  Pain that goes down into your palm or fingers.  A weak feeling in your hands. You may have trouble grabbing and holding items.  Your symptoms may feel worse during the night. How is this diagnosed? This condition is diagnosed with a medical history and physical exam. You may also have tests, including:  An electromyogram (EMG). This test measures electrical signals sent by your nerves into the muscles.  X-rays.  How is this treated? Treatment for this condition includes:  Lifestyle changes. It is important to stop doing or modify the activity that caused your condition.  Physical or occupational therapy.  Medicines for pain and inflammation. This may include medicine that is injected into your wrist.  A wrist splint.  Surgery.  Follow these instructions at home: If you have a splint:  Wear it as told by your health care provider. Remove it only as told by your health care provider.  Loosen the splint if your fingers become numb and tingle, or if they turn cold and blue.  Keep the splint clean and dry. General instructions  Take over-the-counter and prescription medicines only as told by your health care provider.  Rest your wrist from any activity that may be causing your pain. If your condition is work related, talk to your employer about changes that can be made, such as getting a wrist pad to use while typing.  If directed, apply ice to  the painful area: ? Put ice in a plastic bag. ? Place a towel between your skin and the bag. ? Leave the ice on for 20 minutes, 2-3 times per day.  Keep all follow-up visits as told by your health care provider. This is important.  Do any exercises as told by your health care provider, physical therapist, or occupational therapist. Contact a health care provider if:  You have new symptoms.  Your pain is not  controlled with medicines.  Your symptoms get worse. This information is not intended to replace advice given to you by your health care provider. Make sure you discuss any questions you have with your health care provider. Document Released: 06/15/2000 Document Revised: 10/27/2015 Document Reviewed: 11/03/2014 Elsevier Interactive Patient Education  2017 [employer].

## 2017-04-09 LAB — COMPREHENSIVE METABOLIC PANEL
A/G RATIO: 2 (ref 1.2–2.2)
ALT: 19 IU/L (ref 0–44)
AST: 14 IU/L (ref 0–40)
Albumin: 4.5 g/dL (ref 3.6–4.8)
Alkaline Phosphatase: 75 IU/L (ref 39–117)
BUN/Creatinine Ratio: 22 (ref 10–24)
BUN: 25 mg/dL (ref 8–27)
Bilirubin Total: <0.2 mg/dL (ref 0.0–1.2)
CALCIUM: 9.5 mg/dL (ref 8.6–10.2)
CO2: 22 mmol/L (ref 20–29)
CREATININE: 1.14 mg/dL (ref 0.76–1.27)
Chloride: 101 mmol/L (ref 96–106)
GFR, EST AFRICAN AMERICAN: 76 mL/min/{1.73_m2} (ref >59)
GFR, EST NON AFRICAN AMERICAN: 66 mL/min/{1.73_m2} (ref >59)
Globulin, Total: 2.3 g/dL (ref 1.5–4.5)
Glucose: 169 mg/dL — ABNORMAL HIGH (ref 65–99)
Potassium: 5.5 mmol/L — ABNORMAL HIGH (ref 3.5–5.2)
Sodium: 137 mmol/L (ref 134–144)
TOTAL PROTEIN: 6.8 g/dL (ref 6.0–8.5)

## 2017-04-09 LAB — LIPID PANEL
CHOL/HDL RATIO: 3.5 ratio (ref 0.0–5.0)
Cholesterol, Total: 213 mg/dL — ABNORMAL HIGH (ref 100–199)
HDL: 61 mg/dL (ref >39)
LDL CALC: 126 mg/dL — AB (ref 0–99)
Triglycerides: 131 mg/dL (ref 0–149)
VLDL CHOLESTEROL CAL: 26 mg/dL (ref 5–40)

## 2017-04-09 LAB — CBC WITH DIFFERENTIAL/PLATELET
BASOS: 1 %
Basophils Absolute: 0.1 10*3/uL (ref 0.0–0.2)
EOS (ABSOLUTE): 0.2 10*3/uL (ref 0.0–0.4)
EOS: 2 %
HEMATOCRIT: 34.6 % — AB (ref 37.5–51.0)
Hemoglobin: 11.8 g/dL — ABNORMAL LOW (ref 13.0–17.7)
IMMATURE GRANS (ABS): 0 10*3/uL (ref 0.0–0.1)
IMMATURE GRANULOCYTES: 0 %
Lymphocytes Absolute: 2.6 10*3/uL (ref 0.7–3.1)
Lymphs: 26 %
MCH: 29.9 pg (ref 26.6–33.0)
MCHC: 34.1 g/dL (ref 31.5–35.7)
MCV: 88 fL (ref 79–97)
MONOS ABS: 0.5 10*3/uL (ref 0.1–0.9)
Monocytes: 5 %
NEUTROS PCT: 66 %
Neutrophils Absolute: 6.5 10*3/uL (ref 1.4–7.0)
PLATELETS: 368 10*3/uL (ref 150–379)
RBC: 3.94 x10E6/uL — AB (ref 4.14–5.80)
RDW: 13.4 % (ref 12.3–15.4)
WBC: 9.9 10*3/uL (ref 3.4–10.8)

## 2017-04-09 LAB — HEMOGLOBIN A1C
ESTIMATED AVERAGE GLUCOSE: 194 mg/dL
HEMOGLOBIN A1C: 8.4 % — AB (ref 4.8–5.6)

## 2017-04-09 LAB — TSH: TSH: 2.17 u[IU]/mL (ref 0.450–4.500)

## 2017-04-12 ENCOUNTER — Encounter: Payer: Self-pay | Admitting: Family Medicine

## 2017-04-12 MED ORDER — PRAVASTATIN SODIUM 40 MG PO TABS: 80.0000 mg | ORAL_TABLET | Freq: Every day | ORAL | 3 refills | Status: DC

## 2017-04-12 NOTE — Progress Notes (Signed)
Letter sent.

## 2017-04-12 NOTE — Addendum Note (Signed)
Addended by: Wardell Honour on: 04/12/2017 10:34 AM   Modules accepted: Orders

## 2017-04-15 ENCOUNTER — Telehealth: Payer: Self-pay

## 2017-04-15 DIAGNOSIS — E7800 Pure hypercholesterolemia, unspecified: Secondary | ICD-10-CM

## 2017-04-15 NOTE — Telephone Encounter (Signed)
Dr. Tamala Julian, Pt's insurance company (Medicare RX-D [employer]) will not pay for the 40 mg tabs 2 tablets once daily.  Could you please change to 80 mg tabs? Thanks, [employer]

## 2017-04-19 MED ORDER — PRAVASTATIN SODIUM 80 MG PO TABS: 80.0000 mg | ORAL_TABLET | Freq: Every day | ORAL | 1 refills | Status: DC

## 2017-04-19 NOTE — Addendum Note (Signed)
Addended by: Wardell Honour on: 04/19/2017 11:02 AM   Modules accepted: Orders

## 2017-07-31 DIAGNOSIS — M170 Bilateral primary osteoarthritis of knee: Secondary | ICD-10-CM | POA: Diagnosis not present

## 2017-09-09 DIAGNOSIS — M170 Bilateral primary osteoarthritis of knee: Secondary | ICD-10-CM | POA: Diagnosis not present

## 2017-09-18 DIAGNOSIS — M1712 Unilateral primary osteoarthritis, left knee: Secondary | ICD-10-CM | POA: Diagnosis not present

## 2017-09-18 DIAGNOSIS — M170 Bilateral primary osteoarthritis of knee: Secondary | ICD-10-CM | POA: Diagnosis not present

## 2017-09-18 DIAGNOSIS — M1711 Unilateral primary osteoarthritis, right knee: Secondary | ICD-10-CM | POA: Diagnosis not present

## 2017-10-07 ENCOUNTER — Encounter: Payer: Medicare Other | Admitting: Family Medicine

## 2017-10-09 ENCOUNTER — Other Ambulatory Visit: Payer: Self-pay | Admitting: Family Medicine

## 2017-10-09 NOTE — Telephone Encounter (Signed)
Tramadol refill request  LOV 04/08/17 with Dr. Sandi Mealy [address] [place] - [address]

## 2017-10-14 NOTE — Telephone Encounter (Signed)
Appointment 10/25/17 with Dr. Carlota Raspberry.  One month refill provided.

## 2017-10-25 ENCOUNTER — Encounter: Payer: Self-pay | Admitting: Family Medicine

## 2017-11-14 ENCOUNTER — Encounter: Payer: Self-pay | Admitting: Family Medicine

## 2017-11-14 ENCOUNTER — Ambulatory Visit (INDEPENDENT_AMBULATORY_CARE_PROVIDER_SITE_OTHER): Payer: Medicare HMO | Admitting: Family Medicine

## 2017-11-14 ENCOUNTER — Other Ambulatory Visit: Payer: Self-pay

## 2017-11-14 VITALS — BP 162/82 | HR 80 | Temp 97.6°F | Ht 67.5 in | Wt 241.8 lb

## 2017-11-14 DIAGNOSIS — M25561 Pain in right knee: Secondary | ICD-10-CM

## 2017-11-14 DIAGNOSIS — F418 Other specified anxiety disorders: Secondary | ICD-10-CM

## 2017-11-14 DIAGNOSIS — I10 Essential (primary) hypertension: Secondary | ICD-10-CM | POA: Diagnosis not present

## 2017-11-14 DIAGNOSIS — R062 Wheezing: Secondary | ICD-10-CM | POA: Diagnosis not present

## 2017-11-14 DIAGNOSIS — J309 Allergic rhinitis, unspecified: Secondary | ICD-10-CM | POA: Diagnosis not present

## 2017-11-14 DIAGNOSIS — E7800 Pure hypercholesterolemia, unspecified: Secondary | ICD-10-CM

## 2017-11-14 DIAGNOSIS — M25562 Pain in left knee: Secondary | ICD-10-CM | POA: Diagnosis not present

## 2017-11-14 DIAGNOSIS — K219 Gastro-esophageal reflux disease without esophagitis: Secondary | ICD-10-CM

## 2017-11-14 DIAGNOSIS — E1142 Type 2 diabetes mellitus with diabetic polyneuropathy: Secondary | ICD-10-CM | POA: Diagnosis not present

## 2017-11-14 DIAGNOSIS — G8929 Other chronic pain: Secondary | ICD-10-CM

## 2017-11-14 DIAGNOSIS — Z9114 Patient's other noncompliance with medication regimen: Secondary | ICD-10-CM | POA: Diagnosis not present

## 2017-11-14 DIAGNOSIS — R69 Illness, unspecified: Secondary | ICD-10-CM | POA: Diagnosis not present

## 2017-11-14 DIAGNOSIS — Z91148 Patient's other noncompliance with medication regimen for other reason: Secondary | ICD-10-CM

## 2017-11-14 MED ORDER — MONTELUKAST SODIUM 10 MG PO TABS: 10.0000 mg | ORAL_TABLET | Freq: Every day | ORAL | 3 refills | Status: DC

## 2017-11-14 MED ORDER — TRAMADOL HCL 50 MG PO TABS: 50.0000 mg | ORAL_TABLET | Freq: Four times a day (QID) | ORAL | 0 refills | Status: DC | PRN

## 2017-11-14 MED ORDER — METFORMIN HCL 1000 MG PO TABS: 1000.0000 mg | ORAL_TABLET | Freq: Two times a day (BID) | ORAL | 3 refills | Status: DC

## 2017-11-14 MED ORDER — PRAVASTATIN SODIUM 80 MG PO TABS: 80.0000 mg | ORAL_TABLET | Freq: Every day | ORAL | 1 refills | Status: DC

## 2017-11-14 MED ORDER — GLUCOSE BLOOD VI STRP: ORAL_STRIP | 3 refills | Status: DC

## 2017-11-14 MED ORDER — LISINOPRIL 20 MG PO TABS: 20.0000 mg | ORAL_TABLET | Freq: Every day | ORAL | 3 refills | Status: DC

## 2017-11-14 MED ORDER — OMEPRAZOLE 20 MG PO CPDR: 20.0000 mg | DELAYED_RELEASE_CAPSULE | Freq: Every day | ORAL | 3 refills | Status: DC

## 2017-11-14 MED ORDER — GABAPENTIN 300 MG PO CAPS: 300.0000 mg | ORAL_CAPSULE | Freq: Four times a day (QID) | ORAL | 1 refills | Status: DC

## 2017-11-14 MED ORDER — GLIMEPIRIDE 4 MG PO TABS: 4.0000 mg | ORAL_TABLET | Freq: Two times a day (BID) | ORAL | 3 refills | Status: DC

## 2017-11-14 MED ORDER — LIRAGLUTIDE 18 MG/3ML ~~LOC~~ SOPN: 1.8000 mg | PEN_INJECTOR | Freq: Every day | SUBCUTANEOUS | 5 refills | Status: DC

## 2017-11-14 MED ORDER — SERTRALINE HCL 100 MG PO TABS: 200.0000 mg | ORAL_TABLET | Freq: Every day | ORAL | 1 refills | Status: DC

## 2017-11-14 NOTE — Patient Instructions (Addendum)
We will restart your medications today and then return in next 6 weeks with home readings to decide if any needed med changes. As we discussed some of the lab work may be off today as you have been off medications temporarily.  We can always recheck those at future visit.  Restart Victoza at lower dose initially then up to 1.2 mg.  At follow-up in 6 weeks we can decide if we need to increase back up to 1.8 mg.  Tramadol was prescribed today, but please follow-up to discuss that medication further as well as plan for your knee pain.  Blood pressure was elevated in the office today, but I expect that to improve with restarting lisinopril.  You can monitor your home readings and if those are consistently over 140/90, we may need to change medication.  Test strips were sent in.  Thank you for coming in today. Return to the clinic or go to the nearest emergency room if any of your symptoms worsen or new symptoms occur.     IF you received an x-ray today, you will receive an invoice from [facility]. Please contact [facility] at [phone] with questions or concerns regarding your invoice.   IF you received labwork today, you will receive an invoice from [facility]. Please contact [facility] at [phone] with questions or concerns regarding your invoice.   Our billing staff will not be able to assist you with questions regarding bills from these companies.  You will be contacted with the lab results as soon as they are available. The fastest way to get your results is to activate your My Chart account. Instructions are located on the last page of this paperwork. If you have not heard from [place] regarding the results in 2 weeks, please contact this office.

## 2017-11-14 NOTE — Progress Notes (Signed)
Subjective:  By signing my name below, I, Daniel [name], attest that this documentation has been prepared under the direction and in the presence of Daniel Agreste, MD Electronically Signed: Ladene [name], ED Scribe 11/14/2017 at 8:42 AM.   Patient ID: Daniel [name], male    DOB: 1950-08-08, 67 y.o.   MRN: [id number]  Chief Complaint  Patient presents with  . Annual Exam    CPE: also out of medication for a while. would like refills   HPI Daniel [name] is a 67 y.o. male who presents to Primary Care at [facility] initially scheduled for CPE, but out of all medications. H/o HTN, hyperlipidemia, DM, anxiety, depression, allergic rhinitis, GERD. PCP has been Dr. Tamala Julian, last seen 04/08/17. At that [place], DM was uncontrolled. Was having urinary issues thought to be due to elevated blood sugars. Victoza was increased in Oct. Zoloft was increased to 200 mg due to anxiety and depression being uncontrolled. Gabapentin was increased due to peripheral neuropathy and was having carpal tunnel symptoms on the R.  DM Pt was prescribed Metformin 1000 mg bid but was only taking 1000 mg qd x 1 month when he was close to running out, ran out ~20 days ago. He was prescribed Glimepiride 4 mg qd but was taking it bid prior to running out 1 month ago. Pt stopped Victoza 1 month ago due to cost. Reports morning fasting readings of 125-135 and evening readings of 95-100 while on prescribed doses. He only reports 1 hypoglycemic episode but did not have his meter with him at that time. Reports readings of 185-220 now.  Peripheral Neuropathy Pt is taking Gabapentin for neuropathy. He is taking 2 300 mg tabs bid.  Anxiety/Depression Pt has been out of Zoloft in 2 months. States he initially had difficulty waking with lack of motiviation in the mornings, now he has difficulty sleeping since being off of meds. Denies SI.  Hyperlipidemia Pt is taking Pravastatin 80 mg qd.  GERD Omeprazole 20 mg qd. Does not need a refill at this  time.  Chronic Bilateral Knee Pain Pt is taking 2 Tramadol tabs in the morning and 1-2 tabs at night for knee pain.  H/o Reactive Airway Pt reports h/o allergic rhinitis with occasional wheezing. with has been taking Flonase and Singulair. Also has Albuterol at home that he takes prn.   HTN Lisinopril 20 mg qd; last dose was 1 wk ago.  Review of Systems  Musculoskeletal: Positive for arthralgias. Negative for myalgias.  Allergic/Immunologic: Positive for environmental allergies.  Psychiatric/Behavioral: Positive for dysphoric mood and sleep disturbance. Negative for suicidal ideas. The patient is nervous/anxious.       Objective:   Physical Exam  Constitutional: He is oriented to person, place, and time. He appears well-developed and well-nourished.  HENT:  Head: Normocephalic and atraumatic.  Eyes: Pupils are equal, round, and reactive to light. EOM are normal.  Neck: No JVD present. Carotid bruit is not present.  Cardiovascular: Normal rate, regular rhythm and normal heart sounds.  No murmur heard. Pulmonary/Chest: Effort normal and breath sounds normal. He has no rales.  Musculoskeletal: He exhibits edema (1+ pedal edema bilateral ankles, R greater than L).  Neurological: He is alert and oriented to person, place, and time.  Skin: Skin is warm and dry.  Psychiatric: He has a normal mood and affect.  Vitals reviewed.  Vitals:   11/14/17 0823  BP: (!) 160/62  Pulse: 80  Temp: 97.6 F (36.4 C)  TempSrc: Oral  SpO2: 97%  Weight: 241 lb 12.8 oz (109.7 kg)  Height: 5' 7.5" (1.715 m)      Assessment & Plan:   Daniel [name] is a 67 y.o. male Type 2 diabetes mellitus with diabetic polyneuropathy, without long-term current use of insulin ([place]) - Plan: Hemoglobin A1c, glucose blood test strip, metFORMIN (GLUCOPHAGE) 1000 MG tablet, liraglutide (VICTOZA) 18 MG/3ML SOPN, glimepiride (AMARYL) 4 MG tablet, gabapentin (NEURONTIN) 300 MG capsule  -Check A1c, but likely will be  uncontrolled as he has been out of medication.  Also had to decrease dose of metformin and glimepiride to make medication last.  Will restart Victoza as that was tolerated, asked that he check into coupons online or can discuss with pharmacist if other meds in same class may be less costly.  We can also look for coupons here for other meds in that class if that will be more cost effective.  Check home blood sugar readings and recheck in 6 weeks hypoglycemic precautions discussed.   Hypercholesteremia - Plan: Lipid panel, Comprehensive metabolic panel, pravastatin (PRAVACHOL) 80 MG tablet  -Tolerated pravastatin, check labs as a baseline, restart Pravastatin and likely will need repeat testing in the next 6 to 8 weeks  Essential hypertension - Plan: Comprehensive metabolic panel, lisinopril (PRINIVIL,ZESTRIL) 20 MG tablet  Uncontrolled off medication, restart lisinopril at previous dose and recheck-in 6 weeks  Gastroesophageal reflux disease, esophagitis presence not specified - Plan: omeprazole (PRILOSEC) 20 MG capsule  -Stable when using omeprazole.  Refilled.  Allergic rhinitis, unspecified seasonality, unspecified trigger, Wheezing - Plan: montelukast (SINGULAIR) 10 MG tablet  -Continue Singulair, Zyrtec, Flonase nasal spray.  -History of reactive airway/wheezing, possible mild intermittent asthma.  Has not required Symbicort in some time, has albuterol at home.  Continue to treat allergies and that might lessen lower airway symptoms.  RTC precautions if worsening symptoms  Chronic pain of both knees - Plan: traMADol (ULTRAM) 50 MG tablet  -Tramadol refilled at same dose, plan to discuss knee pain and previous treatment, options at future visit as persistent need for tramadol.  Was improving at 200 mg dose of Zoloft, has been off that medication  Anxious depression - Plan: sertraline (ZOLOFT) 100 MG tablet  -So will restart at 100 mg initially then titrate to 200 mg.  Recheck 6  weeks  Nonadherence to medication  Importance of follow-up-discussed with routine visits.  Unfortunately due to other obligations was unable to make last appointment with Dr. Tamala Julian or me.  Medications refilled as above and will follow-up in 6 weeks  Meds ordered this encounter  Medications  . glucose blood test strip    Sig: check sugars once daily. Dx: E11.42    Dispense:  100 each    Refill:  3    [employer] meter test strips  . metFORMIN (GLUCOPHAGE) 1000 MG tablet    Sig: Take 1 tablet (1,000 mg total) by mouth 2 (two) times daily with a meal.    Dispense:  180 tablet    Refill:  3  . liraglutide (VICTOZA) 18 MG/3ML SOPN    Sig: Inject 0.3 mLs (1.8 mg total) into the skin daily. Inject 0.6mg  daily for two weeks; then increase to 1.2mg     Dispense:  3 mL    Refill:  5  . glimepiride (AMARYL) 4 MG tablet    Sig: Take 1 tablet (4 mg total) by mouth 2 (two) times daily.    Dispense:  180 tablet    Refill:  3  .  gabapentin (NEURONTIN) 300 MG capsule    Sig: Take 1-2 capsules (300-600 mg total) by mouth 4 (four) times daily.    Dispense:  450 capsule    Refill:  1  . sertraline (ZOLOFT) 100 MG tablet    Sig: Take 2 tablets (200 mg total) by mouth daily. Start with one per day for initial 2 weeks, then increase to 2 per day.    Dispense:  180 tablet    Refill:  1  . pravastatin (PRAVACHOL) 80 MG tablet    Sig: Take 1 tablet (80 mg total) by mouth daily.    Dispense:  90 tablet    Refill:  1  . omeprazole (PRILOSEC) 20 MG capsule    Sig: Take 1 capsule (20 mg total) by mouth daily.    Dispense:  90 capsule    Refill:  3  . traMADol (ULTRAM) 50 MG tablet    Sig: Take 1 tablet (50 mg total) by mouth every 6 (six) hours as needed.    Dispense:  120 tablet    Refill:  0  . montelukast (SINGULAIR) 10 MG tablet    Sig: Take 1 tablet (10 mg total) by mouth at bedtime.    Dispense:  90 tablet    Refill:  3  . lisinopril (PRINIVIL,ZESTRIL) 20 MG tablet    Sig: Take 1 tablet (20  mg total) by mouth daily.    Dispense:  90 tablet    Refill:  3   Patient Instructions   We will restart your medications today and then return in next 6 weeks with home readings to decide if any needed med changes. As we discussed some of the lab work may be off today as you have been off medications temporarily.  We can always recheck those at future visit.  Restart Victoza at lower dose initially then up to 1.2 mg.  At follow-up in 6 weeks we can decide if we need to increase back up to 1.8 mg.  Tramadol was prescribed today, but please follow-up to discuss that medication further as well as plan for your knee pain.  Blood pressure was elevated in the office today, but I expect that to improve with restarting lisinopril.  You can monitor your home readings and if those are consistently over 140/90, we may need to change medication.  Test strips were sent in.  Thank you for coming in today. Return to the clinic or go to the nearest emergency room if any of your symptoms worsen or new symptoms occur.     IF you received an x-ray today, you will receive an invoice from [facility]. Please contact [facility]iology at [phone] with questions or concerns regarding your invoice.   IF you received labwork today, you will receive an invoice from [facility]. Please contact [facility] at [phone] with questions or concerns regarding your invoice.   Our billing staff will not be able to assist you with questions regarding bills from these companies.  You will be contacted with the lab results as soon as they are available. The fastest way to get your results is to activate your My Chart account. Instructions are located on the last page of this paperwork. If you have not heard from [place] regarding the results in 2 weeks, please contact this office.       I personally performed the services described in this documentation, which was scribed in my presence. The recorded  information has been reviewed and considered for accuracy and  completeness, addended by me as needed, and agree with information above.  Signed,   Merri Ray, MD Primary [facility] at [place].  11/14/17 9:15 AM

## 2017-11-15 ENCOUNTER — Telehealth: Payer: Self-pay

## 2017-11-15 ENCOUNTER — Telehealth: Payer: Self-pay | Admitting: Family Medicine

## 2017-11-15 LAB — COMPREHENSIVE METABOLIC PANEL
ALT: 26 IU/L (ref 0–44)
AST: 23 IU/L (ref 0–40)
Albumin/Globulin Ratio: 1.9 (ref 1.2–2.2)
Albumin: 4.3 g/dL (ref 3.6–4.8)
Alkaline Phosphatase: 81 IU/L (ref 39–117)
BUN/Creatinine Ratio: 18 (ref 10–24)
BUN: 19 mg/dL (ref 8–27)
Bilirubin Total: <0.2 mg/dL (ref 0.0–1.2)
CALCIUM: 9.9 mg/dL (ref 8.6–10.2)
CO2: 23 mmol/L (ref 20–29)
CREATININE: 1.06 mg/dL (ref 0.76–1.27)
Chloride: 98 mmol/L (ref 96–106)
GFR, EST AFRICAN AMERICAN: 83 mL/min/{1.73_m2} (ref >59)
GFR, EST NON AFRICAN AMERICAN: 72 mL/min/{1.73_m2} (ref >59)
Globulin, Total: 2.3 g/dL (ref 1.5–4.5)
Glucose: 167 mg/dL — ABNORMAL HIGH (ref 65–99)
POTASSIUM: 4.5 mmol/L (ref 3.5–5.2)
Sodium: 136 mmol/L (ref 134–144)
TOTAL PROTEIN: 6.6 g/dL (ref 6.0–8.5)

## 2017-11-15 LAB — LIPID PANEL
Chol/HDL Ratio: 3.4 ratio (ref 0.0–5.0)
Cholesterol, Total: 208 mg/dL — ABNORMAL HIGH (ref 100–199)
HDL: 62 mg/dL (ref >39)
LDL Calculated: 103 mg/dL — ABNORMAL HIGH (ref 0–99)
TRIGLYCERIDES: 215 mg/dL — AB (ref 0–149)
VLDL Cholesterol Cal: 43 mg/dL — ABNORMAL HIGH (ref 5–40)

## 2017-11-15 LAB — HEMOGLOBIN A1C
ESTIMATED AVERAGE GLUCOSE: 217 mg/dL
Hgb A1c MFr Bld: 9.2 % — ABNORMAL HIGH (ref 4.8–5.6)

## 2017-11-15 NOTE — Telephone Encounter (Signed)
Called pt pharmacy to give clarification on victoza. Per Carlota Raspberry, sig should read inject 0.6 mg daily for 2 weeks, then increase to 1.2mg 

## 2017-11-15 NOTE — Telephone Encounter (Unsigned)
Copied from [place] [id number]. Topic: Quick Communication - Rx Refill/Question >> Nov 15, 2017  1:01 PM Waylan Rocher, [place] L wrote: Medication: Victoza 1.2 mg Has the patient contacted their pharmacy? Yes.   (Agent: If no, request that the patient contact the pharmacy for the refill.) Preferred Pharmacy (with phone number or street name): [place], [place] - [address] [place] [place] [address] Phone: [phone] Fax: [phone] Agent: Please be advised that RX refills may take up to 3 business days. We ask that you follow-up with your pharmacy.  Patient needs Dr. Blanchie Serve to call pharmacy and let them know his Victoza to be filled should be the 1.2mg  dose and not the 1.8mg  dose.

## 2017-11-16 NOTE — Telephone Encounter (Signed)
Phone call taken care of 11/15/2017

## 2017-11-18 ENCOUNTER — Telehealth: Payer: Self-pay | Admitting: *Deleted

## 2017-11-18 NOTE — Telephone Encounter (Signed)
Pa started GABAPENTIN KEY:  [id number]

## 2017-11-19 ENCOUNTER — Telehealth: Payer: Self-pay | Admitting: *Deleted

## 2017-11-19 NOTE — Telephone Encounter (Signed)
Pa approved  Gabapentin until 07-01-2018 Patient informed.

## 2017-11-26 ENCOUNTER — Encounter: Payer: Self-pay | Admitting: Family Medicine

## 2017-12-04 ENCOUNTER — Encounter: Payer: Self-pay | Admitting: Radiology

## 2017-12-06 DIAGNOSIS — M1712 Unilateral primary osteoarthritis, left knee: Secondary | ICD-10-CM | POA: Diagnosis not present

## 2017-12-06 DIAGNOSIS — M1711 Unilateral primary osteoarthritis, right knee: Secondary | ICD-10-CM | POA: Diagnosis not present

## 2017-12-13 ENCOUNTER — Other Ambulatory Visit: Payer: Self-pay | Admitting: Family Medicine

## 2017-12-13 DIAGNOSIS — G8929 Other chronic pain: Secondary | ICD-10-CM

## 2017-12-13 DIAGNOSIS — M25562 Pain in left knee: Principal | ICD-10-CM

## 2017-12-13 DIAGNOSIS — M25561 Pain in right knee: Principal | ICD-10-CM

## 2017-12-13 NOTE — Telephone Encounter (Signed)
Please advise on refill  Last seen 11/14/17 for CPE  Chronic pain of both knees - Plan: traMADol (ULTRAM) 50 MG tablet             -Tramadol refilled at same dose, plan to discuss knee pain and previous treatment, options at future visit as persistent need for tramadol.   traMADol (ULTRAM) 50 MG tablet #120  0 refills

## 2017-12-14 NOTE — Telephone Encounter (Signed)
Refilled.  Plan on discussing further at next visit as scheduled in a few weeks.

## 2017-12-26 ENCOUNTER — Ambulatory Visit: Payer: Medicare HMO | Admitting: Family Medicine

## 2017-12-31 ENCOUNTER — Encounter: Payer: Self-pay | Admitting: Family Medicine

## 2017-12-31 ENCOUNTER — Ambulatory Visit (INDEPENDENT_AMBULATORY_CARE_PROVIDER_SITE_OTHER): Payer: Medicare HMO | Admitting: Family Medicine

## 2017-12-31 ENCOUNTER — Other Ambulatory Visit: Payer: Self-pay

## 2017-12-31 VITALS — BP 138/70 | HR 75 | Temp 98.3°F | Ht 67.5 in | Wt 235.0 lb

## 2017-12-31 DIAGNOSIS — M25562 Pain in left knee: Secondary | ICD-10-CM

## 2017-12-31 DIAGNOSIS — M25561 Pain in right knee: Secondary | ICD-10-CM

## 2017-12-31 DIAGNOSIS — E1165 Type 2 diabetes mellitus with hyperglycemia: Secondary | ICD-10-CM

## 2017-12-31 DIAGNOSIS — Z23 Encounter for immunization: Secondary | ICD-10-CM | POA: Diagnosis not present

## 2017-12-31 DIAGNOSIS — G8929 Other chronic pain: Secondary | ICD-10-CM

## 2017-12-31 LAB — GLUCOSE, POCT (MANUAL RESULT ENTRY): POC GLUCOSE: 115 mg/dL — AB (ref 70–99)

## 2017-12-31 NOTE — Progress Notes (Signed)
Subjective:    Patient ID: TRUST LEH, male    DOB: [birth year], 67 y.o.   MRN: [id number]  HPI DARYL BEEHLER is a 67 y.o. male Presents today for: Chief Complaint  Patient presents with  . Diabetes    2 month follow up    Here for follow-up, specifically for diabetes.  He was last seen May 16.  Had been out of glimepiride for approximately 1 month and had stopped because of a 1 month prior due to cost.  Reported home readings of 1 85-2 20.  Had only been taking metformin 1000 mg daily as he had been running out instead of twice daily, and was out completely for 20 days prior to last visit. Lab Results  Component Value Date   HGBA1C 9.2 (H) 11/14/2017   He was restarted on the toes initially at 0.6 mg, then plan to increase up to 1.2 mg.  Potentially increasing to 1.8 today if needed.  Was also restarted on glimepiride 4 mg twice daily, and metformin 1000 mg twice daily since about may 17th, and victoza restarted 5/22. Initial readings 97-222, recently 94-209 (6:20am). Overall low to mid 100'. Lowest is 75 on 6/12 at 7pm. Ate 7 hour prior. Rare afternoon snack if needed. Largest meal at dinner, usually by 6:30, by sometimes late night (9:30 last night) due to longer work hours lately.  Morning numbers have run high in the past. Has improved diet and some weight loss. Has had some increased time at work as well.  Single time of 52 with sweating.  Wt Readings from Last 3 Encounters:  12/31/17 235 lb (106.6 kg)  11/14/17 241 lb 12.8 oz (109.7 kg)  04/08/17 233 lb (105.7 kg)   Chronic knee pain - has seen ortho recently for knee pain. Not planning on replacement yet, has had visco injections.  Had been tapering tramadol, but with increased work on knees has required 4 tramadol per day.   Patient Active Problem List   Diagnosis Date Noted  . Anxiety and depression 10/28/2016  . Gastroesophageal reflux disease without esophagitis 10/28/2016  . Seasonal allergic rhinitis due to pollen  10/28/2016  . Primary osteoarthritis of left knee 07/25/2015  . Essential hypertension 07/25/2015  . Hypercholesteremia 07/25/2015  . Type 2 diabetes mellitus with diabetic polyneuropathy, without long-term current use of insulin ([place]) 07/25/2015   Past Medical History:  Diagnosis Date  . Allergic rhinitis   . Allergy   . Anxiety   . Arthritis   . Benign hypertension   . Cataract   . Depression   . Diabetes mellitus ([place])   . GERD (gastroesophageal reflux disease)   . History of pneumonia   . HLD (hyperlipidemia)   . Neuromuscular disorder [facility])    Past Surgical History:  Procedure Laterality Date  . CATARACT EXTRACTION     bilateral  . EYE SURGERY    . KNEE SURGERY     right   Allergies  Allergen Reactions  . Actos [Pioglitazone] Other (See Comments), Shortness Of Breath and Swelling    Blood sugar went up and down and sweating profusely  . Codeine Nausea Only and Palpitations    Palpitations, SOB   Prior to Admission medications   Medication Sig Start Date End Date Taking? Authorizing Provider  albuterol (PROVENTIL HFA;VENTOLIN HFA) 108 (90 Base) MCG/ACT inhaler Inhale 2 puffs into the lungs every 4 (four) hours as needed for wheezing or shortness of breath. Reported on 10/19/2015 10/19/15  Yes Brigitte Pulse,  Laurey Arrow, MD  aspirin 81 MG tablet Take 81 mg by mouth daily.   Yes [provider]  budesonide-formoterol (SYMBICORT) 80-4.5 MCG/ACT inhaler Inhale 2 puffs into the lungs 2 (two) times daily. 09/13/16  Yes Wardell Honour, MD  cetirizine (ZYRTEC) 10 MG tablet Take 10 mg by mouth daily.   Yes [provider]  Cyanocobalamin (VITAMIN B 12 PO) Take 1,000 mg by mouth daily.   Yes [provider]  gabapentin (NEURONTIN) 300 MG capsule Take 1-2 capsules (300-600 mg total) by mouth 4 (four) times daily. 11/14/17  Yes Wendie Agreste, MD  glimepiride (AMARYL) 4 MG tablet Take 1 tablet (4 mg total) by mouth 2 (two) times daily. 11/14/17  Yes Wendie Agreste,  MD  glucose blood (ONE TOUCH ULTRA TEST) test strip Test Twice a day as directed 10/27/14  Yes [provider]  glucose blood test strip check sugars once daily. Dx: E11.42 11/14/17  Yes Wendie Agreste, MD  ibuprofen (ADVIL,MOTRIN) 200 MG tablet Take 800 mg by mouth every morning.   Yes [provider]  liraglutide (VICTOZA) 18 MG/3ML SOPN Inject 0.3 mLs (1.8 mg total) into the skin daily. Inject 0.6mg  daily for two weeks; then increase to 1.2mg  11/14/17  Yes Wendie Agreste, MD  lisinopril (PRINIVIL,ZESTRIL) 20 MG tablet Take 1 tablet (20 mg total) by mouth daily. 11/14/17  Yes Wendie Agreste, MD  metFORMIN (GLUCOPHAGE) 1000 MG tablet Take 1 tablet (1,000 mg total) by mouth 2 (two) times daily with a meal. 11/14/17  Yes Wendie Agreste, MD  montelukast (SINGULAIR) 10 MG tablet Take 1 tablet (10 mg total) by mouth at bedtime. 11/14/17  Yes Wendie Agreste, MD  omeprazole (PRILOSEC) 20 MG capsule Take 1 capsule (20 mg total) by mouth daily. 11/14/17  Yes Wendie Agreste, MD  pravastatin (PRAVACHOL) 80 MG tablet Take 1 tablet (80 mg total) by mouth daily. 11/14/17  Yes Wendie Agreste, MD  sertraline (ZOLOFT) 100 MG tablet Take 2 tablets (200 mg total) by mouth daily. Start with one per day for initial 2 weeks, then increase to 2 per day. 11/14/17  Yes Wendie Agreste, MD  traMADol (ULTRAM) 50 MG tablet TAKE 1 TABLET BY MOUTH EVERY 6 HOURS AS NEEDED 12/14/17  Yes Wendie Agreste, MD   Social History   Socioeconomic History  . Marital status: Married    Spouse name: Not on file  . Number of children: 3  . Years of education: Not on file  . Highest education level: Not on file  Occupational History  . Occupation: Dealer, Animal nutritionist Needs  . Financial resource strain: Not on file  . Food insecurity:    Worry: Not on file    Inability: Not on file  . Transportation needs:    Medical: Not on file    Non-medical: Not on file  Tobacco Use  . Smoking  status: Never Smoker  . Smokeless tobacco: Never Used  Substance and Sexual Activity  . Alcohol use: No  . Drug use: No  . Sexual activity: Not Currently  Lifestyle  . Physical activity:    Days per week: Not on file    Minutes per session: Not on file  . Stress: Not on file  Relationships  . Social connections:    Talks on phone: Not on file    Gets together: Not on file    Attends religious service: Not on file    Active  member of club or organization: Not on file    Attends meetings of clubs or organizations: Not on file    Relationship status: Not on file  . Intimate partner violence:    Fear of current or ex partner: Not on file    Emotionally abused: Not on file    Physically abused: Not on file    Forced sexual activity: Not on file  Other Topics Concern  . Not on file  Social History Narrative  . Not on file    Review of Systems  Constitutional: Negative for fatigue and unexpected weight change.  Eyes: Negative for visual disturbance.  Respiratory: Negative for cough, chest tightness and shortness of breath.   Cardiovascular: Negative for chest pain, palpitations and leg swelling.  Gastrointestinal: Negative for abdominal pain and blood in stool.  Neurological: Negative for dizziness, light-headedness and headaches.       Objective:   Physical Exam  Constitutional: He is oriented to person, place, and time. He appears well-developed and well-nourished.  HENT:  Head: Normocephalic and atraumatic.  Eyes: Pupils are equal, round, and reactive to light. EOM are normal.  Neck: No JVD present. Carotid bruit is not present.  Cardiovascular: Normal rate, regular rhythm and normal heart sounds.  No murmur heard. Pulmonary/Chest: Effort normal and breath sounds normal. He has no rales.  Musculoskeletal: He exhibits no edema.  Neurological: He is alert and oriented to person, place, and time.  Skin: Skin is warm and dry.  Psychiatric: He has a normal mood and affect.   Vitals reviewed.  Vitals:   12/31/17 0949  BP: 138/70  Pulse: 75  Temp: 98.3 F (36.8 C)  TempSrc: Oral  SpO2: 94%  Weight: 235 lb (106.6 kg)  Height: 5' 7.5" (1.715 m)      Assessment & Plan:  DARRIUS MONTANO is a 67 y.o. male Type 2 diabetes mellitus with hyperglycemia, without long-term current use of insulin ([place]) - Plan: POCT glucose (manual entry)  - improved readings on medications.  Still some variability with high readings in the morning single hypoglycemic episode.  Stressed importance of healthy snacks throughout the day if needed during long work hours, trying to avoid late night meals.  -No med changes for now, plan on A1c next visit.  Need for Tdap vaccination - Plan: Tdap vaccine greater than or equal to 7yo IM  -Tdap given  Chronic pain of both knees  -Some recent worsening with increased work and knee walking work.  Continue follow-up with orthopedics, but will continue tramadol up to 4 times per day as needed for now   No orders of the defined types were placed in this encounter.  Patient Instructions    Continue same meds for now. Try healthy afternoon snack if you are going to have extended workday, then smaller meal at dinnertime - especially if late. Watch for any return of symptomatic low readings.   Recheck in 6 weeks for A1c. Let me know if refills of meds needed prior.    IF you received an x-ray today, you will receive an invoice from [facility]. Please contact [facility] Radiology at [phone] with questions or concerns regarding your invoice.   IF you received labwork today, you will receive an invoice from [place]. Please contact [facility] at [phone] with questions or concerns regarding your invoice.   Our billing staff will not be able to assist you with questions regarding bills from these companies.  You will be contacted with the lab results  as soon as they are available. The fastest way to get your results is to activate  your My Chart account. Instructions are located on the last page of this paperwork. If you have not heard from [place] regarding the results in 2 weeks, please contact this office.       Signed,   Merri Ray, MD Primary Care at [place].  12/31/17 10:33 AM

## 2017-12-31 NOTE — Patient Instructions (Addendum)
Continue same meds for now. Try healthy afternoon snack if you are going to have extended workday, then smaller meal at dinnertime - especially if late. Watch for any return of symptomatic low readings.   Recheck in 6 weeks for A1c. Let me know if refills of meds needed prior.    Type 2 Diabetes Mellitus, Self Care, Adult When you have type 2 diabetes (type 2 diabetes mellitus), you must keep your blood sugar (glucose) under control. You can do this with:  Nutrition.  Exercise.  Lifestyle changes.  Medicines or insulin, if needed.  Support from your doctors and others.  How do I manage my blood sugar?  Check your blood sugar level every day, as often as told.  Call your doctor if your blood sugar is above your goal numbers for 2 tests in a row.  Have your A1c (hemoglobin A1c) level checked at least two times a year. Have it checked more often if your doctor tells you to. Your doctor will set treatment goals for you. Generally, you should have these blood sugar levels:  Before meals (preprandial): 80-130 mg/dL (4.4-7.2 mmol/L).  After meals (postprandial): lower than 180 mg/dL (10 mmol/L).  A1c level: less than 7%.  What do I need to know about high blood sugar? High blood sugar is called hyperglycemia. Know the signs of high blood sugar. Signs may include:  Feeling: ? Thirsty. ? Hungry. ? Very tired.  Needing to pee (urinate) more than usual.  Blurry vision.  What do I need to know about low blood sugar? Low blood sugar is called hypoglycemia. This is when blood sugar is at or below 70 mg/dL (3.9 mmol/L). Symptoms may include:  Feeling: ? Hungry. ? Worried or nervous (anxious). ? Sweaty and clammy. ? Confused. ? Dizzy. ? Sleepy. ? Sick to your stomach (nauseous).  Having: ? A fast heartbeat (palpitations). ? A headache. ? A change in your vision. ? Jerky movements that you cannot control (seizure). ? Nightmares. ? Tingling or no feeling (numbness)  around the mouth, lips, or tongue.  Having trouble with: ? Talking. ? Paying attention (concentrating). ? Moving (coordination). ? Sleeping.  Shaking.  Passing out (fainting).  Getting upset easily (irritability).  Treating low blood sugar  To treat low blood sugar, eat or drink something sugary right away. If you can think clearly and swallow safely, follow the 15:15 rule:  Take 15 grams of a fast-acting carb (carbohydrate). Some fast-acting carbs are: ? 1 tube of glucose gel. ? 3 sugar tablets (glucose pills). ? 6-8 pieces of hard candy. ? 4 oz (120 mL) of fruit juice. ? 4 oz (120 mL) regular (not diet) soda.  Check your blood sugar 15 minutes after you take the carb.  If your blood sugar is still at or below 70 mg/dL (3.9 mmol/L), take 15 grams of a carb again.  If your blood sugar does not go above 70 mg/dL (3.9 mmol/L) after 3 tries, get help right away.  After your blood sugar goes back to normal, eat a meal or a snack within 1 hour.  Treating very low blood sugar If your blood sugar is at or below 54 mg/dL (3 mmol/L), you have very low blood sugar (severe hypoglycemia). This is an emergency. Do not wait to see if the symptoms will go away. Get medical help right away. Call your local emergency services (911 in the [place] Do not drive yourself to the hospital. If you have very low blood sugar and you cannot  eat or drink, you may need a glucagon shot (injection). A family member or friend should learn how to check your blood sugar and how to give you a glucagon shot. Ask your doctor if you need to have a glucagon shot kit at home. What else is important to manage my diabetes? Medicine Follow these instructions about insulin and diabetes medicines:  Take them as told by your doctor.  Adjust them as told by your doctor.  Do not run out of them.  Having diabetes can raise your risk for other long-term conditions. These include heart or kidney disease. Your doctor may  prescribe medicines to help prevent problems from diabetes. Food   Make healthy food choices. These include: ? Chicken, fish, egg whites, and beans. ? Oats, whole wheat, bulgur, brown rice, quinoa, and millet. ? Fresh fruits and vegetables. ? Low-fat dairy products. ? Nuts, avocado, olive oil, and canola oil.  Make a food plan with a specialist (dietitian).  Follow instructions from your doctor about what you cannot eat or drink.  Drink enough fluid to keep your pee (urine) clear or pale yellow.  Eat healthy snacks between healthy meals.  Keep track of carbs that you eat. Read food labels. Learn food serving sizes.  Follow your sick day plan when you cannot eat or drink normally. Make this plan with your doctor so it is ready to use. Activity  Exercise at least 3 times a week.  Do not go more than 2 days without exercising.  Talk with your doctor before you start a new exercise. Your doctor may need to adjust your insulin, medicines, or food. Lifestyle   Do not use any tobacco products. These include cigarettes, chewing tobacco, and e-cigarettes.If you need help quitting, ask your doctor.  Ask your doctor how much alcohol is safe for you.  Learn to deal with stress. If you need help with this, ask your doctor. Body care  Stay up to date with your shots (immunizations).  Have your eyes and feet checked by a doctor as often as told.  Check your skin and feet every day. Check for cuts, bruises, redness, blisters, or sores.  Brush your teeth and gums two times a day.  Floss at least one time a day.  Go to the dentist least one time every 6 months.  Stay at a healthy weight. General instructions   Take over-the-counter and prescription medicines only as told by your doctor.  Share your diabetes care plan with: ? Your work or school. ? People you live with.  Check your pee (urine) for ketones: ? When you are sick. ? As told by your doctor.  Carry a card or  wear jewelry that says that you have diabetes.  Ask your doctor: ? Do I need to meet with a diabetes educator? ? Where can I find a support group for people with diabetes?  Keep all follow-up visits as told by your doctor. This is important. Where to find more information: To learn more about diabetes, visit:  American Diabetes Association: www.diabetes.org  American Association of Diabetes Educators: www.diabeteseducator.org/patient-resources  This information is not intended to replace advice given to you by your health care provider. Make sure you discuss any questions you have with your health care provider. Document Released: 10/10/2015 Document Revised: 11/24/2015 Document Reviewed: 07/22/2015 Elsevier Interactive Patient Education  2018 [employer].    IF you received an x-ray today, you will receive an invoice from [facility]. Please contact [facility] at  [phone] with questions or concerns regarding your invoice.   IF you received labwork today, you will receive an invoice from [place]. Please contact [facility] at [phone] with questions or concerns regarding your invoice.   Our billing staff will not be able to assist you with questions regarding bills from these companies.  You will be contacted with the lab results as soon as they are available. The fastest way to get your results is to activate your My Chart account. Instructions are located on the last page of this paperwork. If you have not heard from [place] regarding the results in 2 weeks, please contact this office.

## 2018-01-02 ENCOUNTER — Encounter: Payer: Self-pay | Admitting: Family Medicine

## 2018-01-07 ENCOUNTER — Telehealth: Payer: Self-pay | Admitting: Gastroenterology

## 2018-01-07 NOTE — Telephone Encounter (Signed)
Thanks for letting me know. Fortunately he had a colonoscopy last year without any significant pathology. Not sure if he had diarrhea with blood from hemorrhoids or fissure, or infectious colitis, etc. Glad to hear he is not having any pain, etc. If he is feeling better and bleeding as stopped I would monitor for now. If diarrhea persists or bleeding recurs, he should call back, would do blood work and GI path panel in that setting. If he develops pain otherwise he should also contact us. Thanks

## 2018-01-07 NOTE — Telephone Encounter (Signed)
Patient reassured, he understands to contact office if symptoms reoccur or develops pain.

## 2018-01-07 NOTE — Telephone Encounter (Signed)
Spoke to patient, he states that yesterday he had been constipated, then had diarrhea with blood, lasting from approximately 5:30 pm to 10:30 pm. Today diarrhea is starting to get better, no blood. He denies any rectal pain or hemorrhoids. Does have a history of internal hemorrhoids on last colonoscopy report. Denies N/V, abdominal pain or fever. No SOB or chest pain.

## 2018-01-17 ENCOUNTER — Other Ambulatory Visit: Payer: Self-pay | Admitting: Family Medicine

## 2018-01-17 DIAGNOSIS — G8929 Other chronic pain: Secondary | ICD-10-CM

## 2018-01-17 DIAGNOSIS — M25562 Pain in left knee: Principal | ICD-10-CM

## 2018-01-17 DIAGNOSIS — M25561 Pain in right knee: Principal | ICD-10-CM

## 2018-01-17 NOTE — Telephone Encounter (Signed)
Refill?   Pt was seen 12/31/17. The medication was last sent on 12/14/2017.

## 2018-01-17 NOTE — Telephone Encounter (Signed)
tramadol refill Last Refill:12/14/17 # 120 Last OV: 12/31/17 PCP: Dr Carlota Raspberry Pharmacy:[facility] [address]

## 2018-01-19 NOTE — Telephone Encounter (Signed)
Refilled

## 2018-01-28 DIAGNOSIS — Z0100 Encounter for examination of eyes and vision without abnormal findings: Secondary | ICD-10-CM | POA: Diagnosis not present

## 2018-02-11 ENCOUNTER — Other Ambulatory Visit: Payer: Self-pay

## 2018-02-11 ENCOUNTER — Encounter: Payer: Self-pay | Admitting: Family Medicine

## 2018-02-11 ENCOUNTER — Ambulatory Visit (INDEPENDENT_AMBULATORY_CARE_PROVIDER_SITE_OTHER): Payer: Medicare HMO | Admitting: Family Medicine

## 2018-02-11 VITALS — BP 132/70 | HR 74 | Temp 98.1°F | Ht 67.52 in | Wt 235.4 lb

## 2018-02-11 DIAGNOSIS — E1140 Type 2 diabetes mellitus with diabetic neuropathy, unspecified: Secondary | ICD-10-CM

## 2018-02-11 DIAGNOSIS — G5603 Carpal tunnel syndrome, bilateral upper limbs: Secondary | ICD-10-CM | POA: Diagnosis not present

## 2018-02-11 DIAGNOSIS — R202 Paresthesia of skin: Secondary | ICD-10-CM | POA: Diagnosis not present

## 2018-02-11 DIAGNOSIS — D224 Melanocytic nevi of scalp and neck: Secondary | ICD-10-CM

## 2018-02-11 LAB — POCT GLYCOSYLATED HEMOGLOBIN (HGB A1C): HEMOGLOBIN A1C: 7.8 % — AB (ref 4.0–5.6)

## 2018-02-11 LAB — GLUCOSE, POCT (MANUAL RESULT ENTRY): POC Glucose: 130 mg/dl — AB (ref 70–99)

## 2018-02-11 NOTE — Progress Notes (Signed)
Subjective:  By signing my name below, I, Daniel [name], attest that this documentation has been prepared under the direction and in the presence of Daniel Agreste, MD Electronically Signed: Ladene [name], ED Scribe 02/11/2018 at 8:43 AM.   Patient ID: Daniel [name], male    DOB: Oct 13, 1950, 67 y.o.   MRN: [id number]  Chief Complaint  Patient presents with  . Diabetes    6 week f/u  . Carpal Tunnel  . derm referral    does he need a referral due to the spot that has been near his scalp for a few months   HPI Daniel [name] is a 66 y.o. male who presents to Primary Care at [facility] for multiple concerns.  DM 6 wk follow-up. Last seen 7/2. Was on Amaryl 4 mg bid, Victoza and metformin. Was on statin and is on ACE. Discussed avoiding late night meals and healthy snacks throughout the day to minimize chances of hypoglycemia. H/o diabetic neuropathy, takes 300 mg gabapentin 2 bid. - Lowest reading of 70. He has been eating snacks throughout the day. Morning readings ranging from 70-140. Optho exam done last wk. Denies any tingling in feet and any other symptoms. States he has been reducing carbs for weight loss. Lab Results  Component Value Date   HGBA1C 7.8 (A) 02/11/2018   Derm Referral Pt requests a referral to dermatology for a spot to the L temporal area x 4 months. He notes some itching and 1-2 drops of blood with scratching the area. No personal or family h/o skin CA. Pt has not seen derm in the past.  Carpal Tunnel Symptoms  Pt reports tingling in both wrist x 18 months. States symptoms started in his thumb, index finger, middle finger on th L hand but all fingers were effected on the R. Denies specific injury. 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 He has noticed difficulty with opening jars now due to pain, numbness and slight weakness. Pt has never tried bracing. He is R hand dominant.  Patient Active Problem List   Diagnosis Date Noted  . Anxiety and  depression 10/28/2016  . Gastroesophageal reflux disease without esophagitis 10/28/2016  . Seasonal allergic rhinitis due to pollen 10/28/2016  . Primary osteoarthritis of left knee 07/25/2015  . Essential hypertension 07/25/2015  . Hypercholesteremia 07/25/2015  . Type 2 diabetes mellitus with diabetic polyneuropathy, without long-term current use of insulin ([place]) 07/25/2015   Past Medical History:  Diagnosis Date  . Allergic rhinitis   . Allergy   . Anxiety   . Arthritis   . Benign hypertension   . Cataract   . Depression   . Diabetes mellitus ([place])   . GERD (gastroesophageal reflux disease)   . History of pneumonia   . HLD (hyperlipidemia)   . Neuromuscular disorder [facility])    Past Surgical History:  Procedure Laterality Date  . CATARACT EXTRACTION     bilateral  . EYE SURGERY    . KNEE SURGERY     right   Allergies  Allergen Reactions  . Actos [Pioglitazone] Other (See Comments), Shortness Of Breath and Swelling    Blood sugar went up and down and sweating profusely  . Codeine Nausea Only and Palpitations    Palpitations, SOB   Prior to Admission medications   Medication Sig Start Date End Date Taking? Authorizing Provider  albuterol (PROVENTIL HFA;VENTOLIN HFA) 108 (90 Base) MCG/ACT inhaler Inhale 2 puffs into the  lungs every 4 (four) hours as needed for wheezing or shortness of breath. Reported on 10/19/2015 10/19/15  Yes Shawnee Knapp, MD  aspirin 81 MG tablet Take 81 mg by mouth daily.   Yes [provider]  budesonide-formoterol (SYMBICORT) 80-4.5 MCG/ACT inhaler Inhale 2 puffs into the lungs 2 (two) times daily. 09/13/16  Yes Wardell Honour, MD  cetirizine (ZYRTEC) 10 MG tablet Take 10 mg by mouth daily.   Yes [provider]  Cyanocobalamin (VITAMIN B 12 PO) Take 1,000 mg by mouth daily.   Yes [provider]  gabapentin (NEURONTIN) 300 MG capsule Take 1-2 capsules (300-600 mg total) by mouth 4 (four) times daily. 11/14/17  Yes Daniel Agreste, MD  glimepiride (AMARYL) 4 MG tablet Take 1 tablet (4 mg total) by mouth 2 (two) times daily. 11/14/17  Yes Daniel Agreste, MD  glucose blood (ONE TOUCH ULTRA TEST) test strip Test Twice a day as directed 10/27/14  Yes [provider]  glucose blood test strip check sugars once daily. Dx: E11.42 11/14/17  Yes Daniel Agreste, MD  ibuprofen (ADVIL,MOTRIN) 200 MG tablet Take 800 mg by mouth every morning.   Yes [provider]  liraglutide (VICTOZA) 18 MG/3ML SOPN Inject 0.3 mLs (1.8 mg total) into the skin daily. Inject 0.6mg  daily for two weeks; then increase to 1.2mg  11/14/17  Yes Daniel Agreste, MD  lisinopril (PRINIVIL,ZESTRIL) 20 MG tablet Take 1 tablet (20 mg total) by mouth daily. 11/14/17  Yes Daniel Agreste, MD  metFORMIN (GLUCOPHAGE) 1000 MG tablet Take 1 tablet (1,000 mg total) by mouth 2 (two) times daily with a meal. 11/14/17  Yes Daniel Agreste, MD  omeprazole (PRILOSEC) 20 MG capsule Take 1 capsule (20 mg total) by mouth daily. 11/14/17  Yes Daniel Agreste, MD  pravastatin (PRAVACHOL) 80 MG tablet Take 1 tablet (80 mg total) by mouth daily. 11/14/17  Yes Daniel Agreste, MD  sertraline (ZOLOFT) 100 MG tablet Take 2 tablets (200 mg total) by mouth daily. Start with one per day for initial 2 weeks, then increase to 2 per day. 11/14/17  Yes Daniel Agreste, MD  traMADol (ULTRAM) 50 MG tablet TAKE 1 TABLET BY MOUTH EVERY 6 HOURS AS NEEDED 01/19/18  Yes Daniel Agreste, MD  montelukast (SINGULAIR) 10 MG tablet Take 1 tablet (10 mg total) by mouth at bedtime. Patient not taking: Reported on 02/11/2018 11/14/17   Daniel Agreste, MD   Social History   Socioeconomic History  . Marital status: Married    Spouse name: Not on file  . Number of children: 3  . Years of education: Not on file  . Highest education level: Not on file  Occupational History  . Occupation: Dealer, Animal nutritionist Needs  . Financial resource strain: Not on file    . Food insecurity:    Worry: Not on file    Inability: Not on file  . Transportation needs:    Medical: Not on file    Non-medical: Not on file  Tobacco Use  . Smoking status: Never Smoker  . Smokeless tobacco: Never Used  Substance and Sexual Activity  . Alcohol use: No  . Drug use: No  . Sexual activity: Not Currently  Lifestyle  . Physical activity:    Days per week: Not on file    Minutes per session: Not on file  . Stress: Not on file  Relationships  . Social connections:  Talks on phone: Not on file    Gets together: Not on file    Attends religious service: Not on file    Active member of club or organization: Not on file    Attends meetings of clubs or organizations: Not on file    Relationship status: Not on file  . Intimate partner violence:    Fear of current or ex partner: Not on file    Emotionally abused: Not on file    Physically abused: Not on file    Forced sexual activity: Not on file  Other Topics Concern  . Not on file  Social History Narrative  . Not on file   Review of Systems  Musculoskeletal: Positive for arthralgias.  Skin: Positive for color change (L scalp).  Neurological: Positive for weakness (wrists) and numbness (wrists).      Objective:   Physical Exam  Constitutional: He is oriented to person, place, and time. He appears well-developed and well-nourished. No distress.  HENT:  Head: Normocephalic and atraumatic.  Eyes: Conjunctivae and EOM are normal.  Neck: Neck supple. No tracheal deviation present.  Cardiovascular: Normal rate.  Pulmonary/Chest: Effort normal. No respiratory distress.  Musculoskeletal: Normal range of motion.  Cap refill less than 1 sec. Fair thenar bulk. Appears to have slight thenar bulk on R vs L. Positive Tinel bilaterally. Positive [place].  Neurological: He is alert and oriented to person, place, and time.  Skin: Skin is warm and dry.  Faint hyperpigmented area with slight excoriation and scale over L  temporal frontal scalp ~8 mm x 5 mm. No surrounding erythema. No discharge.  Psychiatric: He has a normal mood and affect. His behavior is normal.  Nursing note and vitals reviewed.  Vitals:   02/11/18 0821 02/11/18 0830  BP: (!) 150/75 132/70  Pulse: 74   Temp: 98.1 F (36.7 C)   TempSrc: Oral   SpO2: 95%   Weight: 235 lb 6.4 oz (106.8 kg)   Height: 5' 7.52" (1.715 m)    Results for orders placed or performed in visit on 02/11/18  POCT glucose (manual entry)  Result Value Ref Range   POC Glucose 130 (A) 70 - 99 mg/dl  POCT glycosylated hemoglobin (Hb A1C)  Result Value Ref Range   Hemoglobin A1C 7.8 (A) 4.0 - 5.6 %   HbA1c POC (<> result, manual entry)     HbA1c, POC (prediabetic range)     HbA1c, POC (controlled diabetic range)        Assessment & Plan:   ULRICK METHOT is a 67 y.o. male Type 2 diabetes mellitus with diabetic neuropathy, without long-term current use of insulin ([place]) - Plan: POCT glucose (manual entry), POCT glycosylated hemoglobin (Hb A1C)  -Improving.  Commended on diet changes, continue to watch snacks, activity/exercise, continue same dose of meds at this time with recheck in 3 months.   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  Refer to Ortho, splint placed for right hand for now.  Continue gabapentin for nerve pain.  No orders of the defined types were placed in this encounter.  Patient Instructions   I will refer you to dermatology to evaluate the area on your scalp.   A1c is improved, but  still a little high. Keep up the good work with snacks during the day as needed, avoid late night meals, and activity/exercise for weight loss.  We can recheck levels in 3 months to decide if changes needed.   Ok to continue gabapentin as needed for nerve pain, but I do suspect carpal tunnel syndrome.  I will refer you to hand specialist.  For now wear the splint initially on the right side and see other information below.    Carpal Tunnel Syndrome Carpal tunnel syndrome is a condition that causes pain in your hand and arm. The carpal tunnel is a narrow area located on the palm side of your wrist. Repeated wrist motion or certain diseases may cause swelling within the tunnel. This swelling pinches the main nerve in the wrist (median nerve). What are the causes? This condition may be caused by:  Repeated wrist motions.  Wrist injuries.  Arthritis.  A cyst or tumor in the carpal tunnel.  Fluid buildup during pregnancy.  Sometimes the cause of this condition is not known. What increases the risk? This condition is more likely to develop in:  People who have jobs that cause them to repeatedly move their wrists in the same motion, such as Art gallery manager.  Women.  People with certain conditions, such as: ? Diabetes. ? Obesity. ? An underactive thyroid (hypothyroidism). ? Kidney failure.  What are the signs or symptoms? Symptoms of this condition include:  A tingling feeling in your fingers, especially in your thumb, index, and middle fingers.  Tingling or numbness in your hand.  An aching feeling in your entire arm, especially when your wrist and elbow are bent for long periods of time.  Wrist pain that goes up your arm to your shoulder.  Pain that goes down into your palm or fingers.  A weak feeling in your hands. You may have trouble grabbing and holding items.  Your symptoms may feel worse during the night. How is this diagnosed? This condition is diagnosed with a medical history and physical exam. You may also have tests, including:  An electromyogram (EMG). This test measures electrical signals sent  by your nerves into the muscles.  X-rays.  How is this treated? Treatment for this condition includes:  Lifestyle changes. It is important to stop doing or modify the activity that caused your condition.  Physical or occupational therapy.  Medicines for pain and inflammation. This may include medicine that is injected into your wrist.  A wrist splint.  Surgery.  Follow these instructions at home: If you have a splint:  Wear it as told by your health care provider. Remove it only as told by your health care provider.  Loosen the splint if your fingers become numb and tingle, or if they turn cold and blue.  Keep the splint clean and dry. General instructions  Take over-the-counter and prescription medicines only as told by your health care provider.  Rest your wrist from any activity that may be causing your pain. If your condition is work related, talk to your employer about changes that can be made, such as getting a wrist pad to use while typing.  If directed, apply ice to the painful area: ? Put ice in a plastic bag. ? Place a towel between your skin and the bag. ? Leave the ice on for 20 minutes, 2-3 times per day.  Keep all follow-up visits as told by your health care provider. This is important.  Do any exercises as told  by your health care provider, physical therapist, or occupational therapist. Contact a health care provider if:  You have new symptoms.  Your pain is not controlled with medicines.  Your symptoms get worse. This information is not intended to replace advice given to you by your health care provider. Make sure you discuss any questions you have with your health care provider. Document Released: 06/15/2000 Document Revised: 10/27/2015 Document Reviewed: 11/03/2014 Elsevier Interactive Patient Education  2018 [employer].   IF you received an x-ray today, you will receive an invoice from [facility]. Please contact [facility]  at [phone] with questions or concerns regarding your invoice.   IF you received labwork today, you will receive an invoice from [place]. Please contact [facility] at [phone] with questions or concerns regarding your invoice.   Our billing staff will not be able to assist you with questions regarding bills from these companies.  You will be contacted with the lab results as soon as they are available. The fastest way to get your results is to activate your My Chart account. Instructions are located on the last page of this paperwork. If you have not heard from [place] regarding the results in 2 weeks, please contact this office.       I personally performed the services described in this documentation, which was scribed in my presence. The recorded information has been reviewed and considered for accuracy and completeness, addended by me as needed, and agree with information above.  Signed,   Merri Ray, MD Primary [facility] at [place].  02/16/18 9:09 PM

## 2018-02-11 NOTE — Patient Instructions (Addendum)
I will refer you to dermatology to evaluate the area on your scalp.   A1c is improved, but still a little high. Keep up the good work with snacks during the day as needed, avoid late night meals, and activity/exercise for weight loss. We can recheck levels in 3 months to decide if changes needed.   Ok to continue gabapentin as needed for nerve pain, but I do suspect carpal tunnel syndrome.  I will refer you to hand specialist.  For now wear the splint initially on the right side and see other information below.    Carpal Tunnel Syndrome Carpal tunnel syndrome is a condition that causes pain in your hand and arm. The carpal tunnel is a narrow area located on the palm side of your wrist. Repeated wrist motion or certain diseases may cause swelling within the tunnel. This swelling pinches the main nerve in the wrist (median nerve). What are the causes? This condition may be caused by:  Repeated wrist motions.  Wrist injuries.  Arthritis.  A cyst or tumor in the carpal tunnel.  Fluid buildup during pregnancy.  Sometimes the cause of this condition is not known. What increases the risk? This condition is more likely to develop in:  People who have jobs that cause them to repeatedly move their wrists in the same motion, such as Art gallery manager.  Women.  People with certain conditions, such as: ? Diabetes. ? Obesity. ? An underactive thyroid (hypothyroidism). ? Kidney failure.  What are the signs or symptoms? Symptoms of this condition include:  A tingling feeling in your fingers, especially in your thumb, index, and middle fingers.  Tingling or numbness in your hand.  An aching feeling in your entire arm, especially when your wrist and elbow are bent for long periods of time.  Wrist pain that goes up your arm to your shoulder.  Pain that goes down into your palm or fingers.  A weak feeling in your hands. You may have trouble grabbing and holding items.  Your  symptoms may feel worse during the night. How is this diagnosed? This condition is diagnosed with a medical history and physical exam. You may also have tests, including:  An electromyogram (EMG). This test measures electrical signals sent by your nerves into the muscles.  X-rays.  How is this treated? Treatment for this condition includes:  Lifestyle changes. It is important to stop doing or modify the activity that caused your condition.  Physical or occupational therapy.  Medicines for pain and inflammation. This may include medicine that is injected into your wrist.  A wrist splint.  Surgery.  Follow these instructions at home: If you have a splint:  Wear it as told by your health care provider. Remove it only as told by your health care provider.  Loosen the splint if your fingers become numb and tingle, or if they turn cold and blue.  Keep the splint clean and dry. General instructions  Take over-the-counter and prescription medicines only as told by your health care provider.  Rest your wrist from any activity that may be causing your pain. If your condition is work related, talk to your employer about changes that can be made, such as getting a wrist pad to use while typing.  If directed, apply ice to the painful area: ? Put ice in a plastic bag. ? Place a towel between your skin and the bag. ? Leave the ice on for 20 minutes, 2-3 times per day.  Keep all  follow-up visits as told by your health care provider. This is important.  Do any exercises as told by your health care provider, physical therapist, or occupational therapist. Contact a health care provider if:  You have new symptoms.  Your pain is not controlled with medicines.  Your symptoms get worse. This information is not intended to replace advice given to you by your health care provider. Make sure you discuss any questions you have with your health care provider. Document Released: 06/15/2000  Document Revised: 10/27/2015 Document Reviewed: 11/03/2014 Elsevier Interactive Patient Education  2018 [employer].   IF you received an x-ray today, you will receive an invoice from [facility]. Please contact [facility] at [phone] with questions or concerns regarding your invoice.   IF you received labwork today, you will receive an invoice from [employer]. Please contact [facility] at [phone] with questions or concerns regarding your invoice.   Our billing staff will not be able to assist you with questions regarding bills from these companies.  You will be contacted with the lab results as soon as they are available. The fastest way to get your results is to activate your My Chart account. Instructions are located on the last page of this paperwork. If you have not heard from [place] regarding the results in 2 weeks, please contact this office.

## 2018-02-18 ENCOUNTER — Encounter (INDEPENDENT_AMBULATORY_CARE_PROVIDER_SITE_OTHER): Payer: Self-pay | Admitting: Orthopaedic Surgery

## 2018-02-18 ENCOUNTER — Ambulatory Visit (INDEPENDENT_AMBULATORY_CARE_PROVIDER_SITE_OTHER): Payer: Medicare HMO | Admitting: Orthopaedic Surgery

## 2018-02-18 VITALS — BP 132/73 | HR 73 | Ht 67.5 in | Wt 235.0 lb

## 2018-02-18 DIAGNOSIS — G5601 Carpal tunnel syndrome, right upper limb: Secondary | ICD-10-CM | POA: Diagnosis not present

## 2018-02-18 NOTE — Progress Notes (Signed)
Office Visit Note   Patient: Daniel [name]           Date of Birth: 1951/03/14           MRN: [id number] Visit Date: 02/18/2018              Requested by: Wendie Agreste, MD [address] [place], [place] [address] PCP: Wendie Agreste, MD   Assessment & Plan: Visit Diagnoses:  1. Carpal tunnel syndrome, right upper limb     Plan: Bilateral carpal tunnel symptoms right greater than left.  Will order EMGs and nerve conduction studies.  Long discussion regarding diagnosis and possible treatment options including splinting, injections and surgery Follow-Up Instructions: Return after EMG's.   Orders:  No orders of the defined types were placed in this encounter.  No orders of the defined types were placed in this encounter.     Procedures: No procedures performed   Clinical Data: No additional findings.   Subjective: Chief Complaint  Patient presents with  . New Patient (Initial Visit)    18 MO Chicken, NO INJURY PCP REFERRED HIM HERE FOR FURTHER TESTING  [name] is 67 years old and visited the office with a history of bilateral hand numbness tingling.  The problem has been progressive and is now reached the point where he is actually dropping objects.  He was seen by his primary care physician recently with a vehicle diagnosis of bilateral carpal tunnel syndrome.  He was given a volar wrist splint on the right last week and notes that it made a big difference.  He is diabetic and has diabetic neuropathy he takes gabapentin for burning in his feet.  He has been taking tramadol for bilateral knee osteoarthritis and medicines to sleep at night related to his hands-Zoloft.  HPI  Review of Systems  Constitutional: Negative for fatigue and fever.  HENT: Negative for ear pain.   Eyes: Negative for pain.  Respiratory: Negative for cough and shortness of breath.   Cardiovascular: Negative for leg swelling.  Gastrointestinal: Positive for constipation.  Negative for diarrhea.  Genitourinary: Negative for difficulty urinating.  Musculoskeletal: Positive for back pain. Negative for neck pain.  Skin: Negative for rash.  Allergic/Immunologic: Negative for food allergies.  Neurological: Positive for weakness and numbness.  Hematological: Bruises/bleeds easily.  Psychiatric/Behavioral: Positive for sleep disturbance.     Objective: Vital Signs: BP 132/73 (BP Location: Left Arm, Patient Position: Sitting, Cuff Size: Normal)   Pulse 73   Ht 5' 7.5" (1.715 m)   Wt 235 lb (106.6 kg)   BMI 36.26 kg/m   Physical Exam  Constitutional: He is oriented to person, place, and time. He appears well-developed and well-nourished.  HENT:  Mouth/Throat: Oropharynx is clear and moist.  Eyes: Pupils are equal, round, and reactive to light. EOM are normal.  Pulmonary/Chest: Effort normal.  Neurological: He is alert and oriented to person, place, and time.  Skin: Skin is warm and dry.  Psychiatric: He has a normal mood and affect. His behavior is normal.    Ortho Exam awake alert and oriented x3.  Comfortable sitting.  Bilateral Phalen's and Tinel's at the wrists.  Neurovascular exam intact.  Good opposition of thumb little finger with good strength.  Skin intact.  Minimal thenar atrophy right greater than left. good grip and release  Specialty Comments:  No specialty comments available.  Imaging: No results found.   PMFS History: Patient Active Problem List   Diagnosis Date  Noted  . Carpal tunnel syndrome, right upper limb 02/18/2018  . Anxiety and depression 10/28/2016  . Gastroesophageal reflux disease without esophagitis 10/28/2016  . Seasonal allergic rhinitis due to pollen 10/28/2016  . Primary osteoarthritis of left knee 07/25/2015  . Essential hypertension 07/25/2015  . Hypercholesteremia 07/25/2015  . Type 2 diabetes mellitus with diabetic polyneuropathy, without long-term current use of insulin ([place]) 07/25/2015   Past Medical  History:  Diagnosis Date  . Allergic rhinitis   . Allergy   . Anxiety   . Arthritis   . Benign hypertension   . Cataract   . Depression   . Diabetes mellitus ([place])   . GERD (gastroesophageal reflux disease)   . History of pneumonia   . HLD (hyperlipidemia)   . Neuromuscular disorder ([place])     Family History  Problem Relation Age of Onset  . Diabetes Father   . Colon polyps Father   . Atrial fibrillation Mother   . Prostate cancer Paternal Uncle   . Cancer Sister   . Esophageal cancer Neg Hx   . Rectal cancer Neg Hx   . Stomach cancer Neg Hx     Past Surgical History:  Procedure Laterality Date  . CATARACT EXTRACTION     bilateral  . EYE SURGERY    . KNEE SURGERY     right   Social History   Occupational History  . Occupation: Dealer, Company secretary  Tobacco Use  . Smoking status: Never Smoker  . Smokeless tobacco: Never Used  Substance and Sexual Activity  . Alcohol use: No  . Drug use: No  . Sexual activity: Not Currently

## 2018-02-24 ENCOUNTER — Telehealth: Payer: Self-pay | Admitting: Family Medicine

## 2018-02-24 NOTE — Telephone Encounter (Signed)
Copied from [place]. Topic: General - Other >> Feb 24, 2018 10:07 AM Reyne Dumas L wrote: Reason for CRM:   Amy from [facility] calling.  Pt has been delayed on refilling medications and she would like to report that pt is not in compliance with medication guidelines. Amy can be reached at [phone]

## 2018-02-26 NOTE — Telephone Encounter (Signed)
Please verify specific prescription in question, and can find out more info from patient at that time. Thanks.

## 2018-02-26 NOTE — Telephone Encounter (Signed)
Amy states it is his lisinopril,metformin,pravastatin,amaryl She said the patient stated it had to do with him changing doctors but when she research back a year he has a history of not filling them when they are suppose to or not filling at all.

## 2018-03-04 ENCOUNTER — Encounter (INDEPENDENT_AMBULATORY_CARE_PROVIDER_SITE_OTHER): Payer: Self-pay | Admitting: Physical Medicine and Rehabilitation

## 2018-03-05 ENCOUNTER — Encounter (INDEPENDENT_AMBULATORY_CARE_PROVIDER_SITE_OTHER): Payer: Self-pay | Admitting: Physical Medicine and Rehabilitation

## 2018-03-05 ENCOUNTER — Ambulatory Visit (INDEPENDENT_AMBULATORY_CARE_PROVIDER_SITE_OTHER): Payer: Medicare HMO | Admitting: Physical Medicine and Rehabilitation

## 2018-03-05 ENCOUNTER — Other Ambulatory Visit: Payer: Self-pay | Admitting: Family Medicine

## 2018-03-05 DIAGNOSIS — M25562 Pain in left knee: Principal | ICD-10-CM

## 2018-03-05 DIAGNOSIS — R202 Paresthesia of skin: Secondary | ICD-10-CM | POA: Diagnosis not present

## 2018-03-05 DIAGNOSIS — G8929 Other chronic pain: Secondary | ICD-10-CM

## 2018-03-05 DIAGNOSIS — M25561 Pain in right knee: Principal | ICD-10-CM

## 2018-03-05 NOTE — Progress Notes (Signed)
 .  Numeric Pain Rating Scale and Functional Assessment Average Pain 7   In the last MONTH (on 0-10 scale) has pain interfered with the following?  1. General activity like being  able to carry out your everyday physical activities such as walking, climbing stairs, carrying groceries, or moving a chair?  Rating(6)     

## 2018-03-05 NOTE — Progress Notes (Signed)
Daniel [name] - 67 y.o. male MRN [id number]  Date of birth: [birth year]  Office Visit Note: Visit Date: 03/05/2018 PCP: Wendie Agreste, MD Referred by: Wendie Agreste, MD  Subjective: Chief Complaint  Patient presents with  . Right Hand - Pain, Numbness  . Left Hand - Pain, Numbness   HPI: [name] is a 3 right-hand-dominant gentleman who comes in today at the request of Dr. Joni Fears for electrodiagnostic study of both upper limbs.  She reports chronic history of pain numbness and tingling in the radial digits of both hands.  He reports that it started more on the left side more recently over the last several months with significant worsening and more consistent pain.  He reports that probably he has had 18 months of increasing symptoms.  He reports chronic symptoms over many years of off-and-on numbness.  He reports that he is now dropping objects and has difficulty buttoning close.  He has been using a splint on the right and has felt like that made some difference.  He is a diabetic with poorly controlled diabetes.  He does have some tingling in the feet.  He has been using gabapentin for symptoms in his feet and that has seemed to help but is not helping his hands.  He has not had prior electrodiagnostic studies.  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  As an aside he reports of a fall yesterday.  He had a ground-level fall outside on his driveway and fell on his right shoulder yesterday.  He is having decreased range of motion of the right shoulder with general pain.  He had a history of prior left rotator cuff arthroscopic surgery.     ROS Otherwise per HPI.  Assessment & Plan: Visit Diagnoses:  1. Paresthesia of skin     Plan:  Impression: The above electrodiagnostic study is ABNORMAL and reveals evidence of: 1.  A severe BILATERAL RIGHT WORSE THAN LEFTmedian nerve entrapment  at the wrist (carpal tunnel syndrome) affecting sensory and motor components. The lesion is characterized by sensory and motor demyelination with evidence of axonal injury.  Despite appropriate decompressive therapy there probably would be some residual symptoms particularly on the right.  2.  There is also evidence of underlying peripheral polyneuropathy.  If further work-up is needed on this I would suggest referral to a neurologist for 3 limb study.  There is no significant electrodiagnostic evidence of any other focal nerve entrapment or cervical radiculopathy.   Recommendations: 1.  Follow-up with referring physician.  We did obtain follow-up with Dr. Durward Fortes for tomorrow.  This was also to look at his shoulder which seems to be fairly significant mechanical issue with decreased range of motion after a fall yesterday.  I felt like since we can get an appointment tomorrow it was better if Dr. Durward Fortes evaluated his shoulder. 2.  Continue current management of symptoms. 3.  Suggest surgical evaluation.     Meds & Orders: No orders of the defined types were placed in this encounter.   Orders Placed This Encounter  Procedures  . NCV with EMG (electromyography)    Follow-up: Return for Joni Fears, MD, tomorrow.   Procedures: No procedures performed  EMG & NCV Findings: Evaluation of the left median motor and the right median motor nerves showed prolonged distal onset latency (L8.8, R10.8 ms), reduced amplitude (  L3.8, R0.0 mV), and decreased conduction velocity (Elbow-Wrist, L37, R17 m/s).  The right ulnar motor nerve showed decreased conduction velocity (B Elbow-Wrist, 50 m/s).  The left median (across palm) sensory nerve showed no response (Wrist) and prolonged distal peak latency (Palm, 6.0 ms).  The right median (across palm) sensory nerve showed no response (Wrist) and no response (Pal mm).  The right ulnar sensory nerve showed prolonged distal peak latency (4.1 ms), reduced  amplitude (10.1 V), and decreased conduction velocity (Wrist-5th Digit, 34 m/s).  All remaining nerves (as indicated in the following tables) were within normal limits.  Left vs. Right side comparison data for the medianotor nerve indicates abnormal L-R latency difference (2.0 ms), abnormal L-R amplitude difference (100.0 %), and abnormal L-R velocity difference (Elbow-Wrist, 20 m/s).    There were denervation potential seen in both the right APB and FDI.  All other examined muscles (as indicated in the following table) showed no evidence of electrical instability.    Impression: The above electrodiagnostic study is ABNORMAL and reveals evidence of: 1.  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 The lesion is characterized by sensory and motor demyelination with evidence of axonal injury.  Despite appropriate decompressive therapy there probably would be some residual symptoms particularly on the right.  2.  There is also evidence of underlying peripheral polyneuropathy.  If further work-up is needed on this I would suggest referral to a neurologist for 3 limb study.  There is no significant electrodiagnostic evidence of any other focal nerve entrapment or cervical radiculopathy.   Recommendations: 1.  Follow-up with referring physician.  We did obtain follow-up with Dr. Durward Fortes for tomorrow.  This was also to look at his shoulder which seems to be fairly significant mechanical issue with decreased range of motion after a fall yesterday.  I felt like since we can get an appointment tomorrow it was better if Dr. Durward Fortes evaluated his shoulder. 2.  Continue current management of symptoms. 3.  Suggest surgical evaluation.   ___________________________ Laurence Spates FAAPMR Board Certified, American Board of Physical Medicine and Rehabilitation    Nerve Conduction Studies Anti Sensory Summary  Table   Stim Site NR Peak (ms) Norm Peak (ms) P-T Amp (V) Norm P-T Amp Site1 Site2 Delta-P (ms) Dist (cm) Vel (m/s) Norm Vel (m/s)  Left Median Acr Palm Anti Sensory (2nd Digit)  31.7C  Wrist *NR  <3.6  >10 Wrist Palm  0.0    Palm    *6.0 <2.0 9.4         Right Median Acr Palm Anti Sensory (2nd Digit)  30.5C  Wrist *NR  <3.6  >10 Wrist Palm  0.0    Palm *NR  <2.0          Right Radial Anti Sensory (Base 1st Digit)  31.3C  Wrist    2.3 <3.1 13.2  Wrist Base 1st Digit 2.3 0.0    Right Ulnar Anti Sensory (5th Digit)  31.1C  Wrist    *4.1 <3.7 *10.1 >15.0 Wrist 5th Digit 4.1 14.0 *34 >38   Motor Summary Table   Stim Site NR Onset (ms) Norm Onset (ms) O-P Amp (mV) Norm O-P Amp Site1 Site2 Delta-0 (ms) Dist (cm) Vel (m/s) Norm Vel (m/s)  Left Median Motor (Abd Poll Brev)  31.9C  Wrist    *8.8 <4.2 *3.8 >5 Elbow Wrist 6.0 22.0 *37 >50  Elbow    14.8  3.5  Right Median Motor (Abd Poll Brev)  31.6C  Wrist    *10.8 <4.2 *0.0 >5 Elbow Wrist 13.7 23.0 *17 >50  Elbow    24.5  0.3         Right Ulnar Motor (Abd Dig Min)  31.9C  Wrist    4.1 <4.2 7.4 >3 B Elbow Wrist 4.7 23.5 *50 >53  B Elbow    8.8  7.2  A Elbow B Elbow 1.4 9.0 64 >53  A Elbow    10.2  6.8          EMG   Side Muscle Nerve Root Ins Act Fibs Psw Amp Dur Poly Recrt Int Fraser Din Comment  Right Abd Poll Brev Median C8-T1 Inc 2+ 2+ Nml Nml 0 Dcr Nml   Right 1stDorInt Ulnar C8-T1 Inc 1+ 1+ Nml Nml 0 Nml Nml   Right PronatorTeres Median C6-7 Nml Nml Nml Nml Nml 0 Nml Nml   Right Biceps Musculocut C5-6 Nml Nml Nml Nml Nml 0 Nml Nml   Right Deltoid Axillary C5-6 Nml Nml Nml Nml Nml 0 Nml Nml     Nerve Conduction Studies Anti Sensory Left/Right Comparison   Stim Site L Lat (ms) R Lat (ms) L-R Lat (ms) L Amp (V) R Amp (V) L-R Amp (%) Site1 Site2 L Vel (m/s) R Vel (m/s) L-R Vel (m/s)  Median Acr Palm Anti Sensory (2nd Digit)  31.7C  Wrist       Wrist Palm     Palm *6.0   9.4         Radial Anti Sensory (Base 1st Digit)   31.3C  Wrist  2.3   13.2  Wrist Base 1st Digit     Ulnar Anti Sensory (5th Digit)  31.1C  Wrist  *4.1   *10.1  Wrist 5th Digit  *34    Motor Left/Right Comparison   Stim Site L Lat (ms) R Lat (ms) L-R Lat (ms) L Amp (mV) R Amp (mV) L-R Amp (%) Site1 Site2 L Vel (m/s) R Vel (m/s) L-R Vel (m/s)  Median Motor (Abd Poll Brev)  31.9C  Wrist *8.8 *10.8 *2.0 *3.8 *0.0 *100.0 Elbow Wrist *37 *17 *20  Elbow 14.8 24.5 9.7 3.5 0.3 91.4       Ulnar Motor (Abd Dig Min)  31.9C  Wrist  4.1   7.4  B Elbow Wrist  *50   B Elbow  8.8   7.2  A Elbow B Elbow  64   A Elbow  10.2   6.8           Waveforms:                Clinical History: No specialty comments available.   He reports that he has never smoked. He has never used smokeless tobacco.  Recent Labs    04/08/17 1047 11/14/17 0923 02/11/18 0902  HGBA1C 8.4* 9.2* 7.8*    Objective:  VS:  HT:    WT:   BMI:     BP:   HR: bpm  TEMP: ( )  RESP:  Physical Exam  Musculoskeletal:  Decreased and painful range of motion of the right shoulder.  Exam was limited today.  Inspection reveals flattening of the bilateral APB no atrophy of the bilateral FDI or hand intrinsics. There is no swelling, color changes, allodynia or dystrophic changes. There is 5 out of 5 strength in the bilateral wrist extension, finger abduction and long finger flexion.  There is decreased sensation to light  touch in a median nerve distribution bilaterally. There is a positive Phalen's test bilaterally. There is a negative Hoffmann's test bilaterally.    Ortho Exam Imaging: No results found.  Past Medical/Family/Surgical/Social History: Medications & Allergies reviewed per EMR, new medications updated. Patient Active Problem List   Diagnosis Date Noted  . Carpal tunnel syndrome, right upper limb 02/18/2018  . Anxiety and depression 10/28/2016  . Gastroesophageal reflux disease without esophagitis 10/28/2016  . Seasonal allergic rhinitis due to pollen  10/28/2016  . Primary osteoarthritis of left knee 07/25/2015  . Essential hypertension 07/25/2015  . Hypercholesteremia 07/25/2015  . Type 2 diabetes mellitus with diabetic polyneuropathy, without long-term current use of insulin ([place]) 07/25/2015   Past Medical History:  Diagnosis Date  . Allergic rhinitis   . Allergy   . Anxiety   . Arthritis   . Benign hypertension   . Cataract   . Depression   . Diabetes mellitus ([place])   . GERD (gastroesophageal reflux disease)   . History of pneumonia   . HLD (hyperlipidemia)   . Neuromuscular disorder ([place])    Family History  Problem Relation Age of Onset  . Diabetes Father   . Colon polyps Father   . Atrial fibrillation Mother   . Prostate cancer Paternal Uncle   . Cancer Sister   . Esophageal cancer Neg Hx   . Rectal cancer Neg Hx   . Stomach cancer Neg Hx    Past Surgical History:  Procedure Laterality Date  . CATARACT EXTRACTION     bilateral  . EYE SURGERY    . KNEE SURGERY     right   Social History   Occupational History  . Occupation: Dealer, Company secretary  Tobacco Use  . Smoking status: Never Smoker  . Smokeless tobacco: Never Used  Substance and Sexual Activity  . Alcohol use: No  . Drug use: No  . Sexual activity: Not Currently

## 2018-03-05 NOTE — Telephone Encounter (Signed)
Refill? For knee pain

## 2018-03-05 NOTE — Telephone Encounter (Signed)
Refilled

## 2018-03-05 NOTE — Procedures (Signed)
EMG & NCV Findings: Evaluation of the left median motor and the right median motor nerves showed prolonged distal onset latency (L8.8, R10.8 ms), reduced amplitude (L3.8, R0.0 mV), and decreased conduction velocity (Elbow-Wrist, L37, R17 m/s).  The right ulnar motor nerve showed decreased conduction velocity (B Elbow-Wrist, 50 m/s).  The left median (across palm) sensory nerve showed no response (Wrist) and prolonged distal peak latency (Palm, 6.0 ms).  The right median (across palm) sensory nerve showed no response (Wrist) and no response (Pal mm).  The right ulnar sensory nerve showed prolonged distal peak latency (4.1 ms), reduced amplitude (10.1 V), and decreased conduction velocity (Wrist-5th Digit, 34 m/s).  All remaining nerves (as indicated in the following tables) were within normal limits.  Left vs. Right side comparison data for the medianotor nerve indicates abnormal L-R latency difference (2.0 ms), abnormal L-R amplitude difference (100.0 %), and abnormal L-R velocity difference (Elbow-Wrist, 20 m/s).    There were denervation potential seen in both the right APB and FDI.  All other examined muscles (as indicated in the following table) showed no evidence of electrical instability.    Impression: The above electrodiagnostic study is ABNORMAL and reveals evidence of: 1.  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 The lesion is characterized by sensory and motor demyelination with evidence of axonal injury.  Despite appropriate decompressive therapy there probably would be some residual symptoms particularly on the right.  2.  There is also evidence of underlying peripheral polyneuropathy.  If further work-up is needed on this I would suggest referral to a neurologist for 3 limb study.  There is no significant electrodiagnostic evidence of any other focal nerve entrapment or cervical  radiculopathy.   Recommendations: 1.  Follow-up with referring physician.  We did obtain follow-up with Dr. Durward Fortes for tomorrow.  This was also to look at his shoulder which seems to be fairly significant mechanical issue with decreased range of motion after a fall yesterday.  I felt like since we can get an appointment tomorrow it was better if Dr. Durward Fortes evaluated his shoulder. 2.  Continue current management of symptoms. 3.  Suggest surgical evaluation.   ___________________________ Laurence Spates FAAPMR Board Certified, American Board of Physical Medicine and Rehabilitation    Nerve Conduction Studies Anti Sensory Summary Table   Stim Site NR Peak (ms) Norm Peak (ms) P-T Amp (V) Norm P-T Amp Site1 Site2 Delta-P (ms) Dist (cm) Vel (m/s) Norm Vel (m/s)  Left Median Acr Palm Anti Sensory (2nd Digit)  31.7C  Wrist *NR  <3.6  >10 Wrist Palm  0.0    Palm    *6.0 <2.0 9.4         Right Median Acr Palm Anti Sensory (2nd Digit)  30.5C  Wrist *NR  <3.6  >10 Wrist Palm  0.0    Palm *NR  <2.0          Right Radial Anti Sensory (Base 1st Digit)  31.3C  Wrist    2.3 <3.1 13.2  Wrist Base 1st Digit 2.3 0.0    Right Ulnar Anti Sensory (5th Digit)  31.1C  Wrist    *4.1 <3.7 *10.1 >15.0 Wrist 5th Digit 4.1 14.0 *34 >38   Motor Summary Table   Stim Site NR Onset (ms) Norm Onset (ms) O-P Amp (mV) Norm O-P Amp Site1 Site2 Delta-0 (ms) Dist (cm) Vel (m/s) Norm Vel (m/s)  Left Median Motor (Abd Poll Brev)  31.9C  Wrist    *8.8 <4.2 *3.8 >5 Elbow Wrist 6.0 22.0 *37 >50  Elbow    14.8  3.5         Right Median Motor (Abd Poll Brev)  31.6C  Wrist    *10.8 <4.2 *0.0 >5 Elbow Wrist 13.7 23.0 *17 >50  Elbow    24.5  0.3         Right Ulnar Motor (Abd Dig Min)  31.9C  Wrist    4.1 <4.2 7.4 >3 B Elbow Wrist 4.7 23.5 *50 >53  B Elbow    8.8  7.2  A Elbow B Elbow 1.4 9.0 64 >53  A Elbow    10.2  6.8          EMG   Side Muscle Nerve Root Ins Act Fibs Psw Amp Dur Poly Recrt Int Fraser Din Comment    Right Abd Poll Brev Median C8-T1 Inc 2+ 2+ Nml Nml 0 Dcr Nml   Right 1stDorInt Ulnar C8-T1 Inc 1+ 1+ Nml Nml 0 Nml Nml   Right PronatorTeres Median C6-7 Nml Nml Nml Nml Nml 0 Nml Nml   Right Biceps Musculocut C5-6 Nml Nml Nml Nml Nml 0 Nml Nml   Right Deltoid Axillary C5-6 Nml Nml Nml Nml Nml 0 Nml Nml     Nerve Conduction Studies Anti Sensory Left/Right Comparison   Stim Site L Lat (ms) R Lat (ms) L-R Lat (ms) L Amp (V) R Amp (V) L-R Amp (%) Site1 Site2 L Vel (m/s) R Vel (m/s) L-R Vel (m/s)  Median Acr Palm Anti Sensory (2nd Digit)  31.7C  Wrist       Wrist Palm     Palm *6.0   9.4         Radial Anti Sensory (Base 1st Digit)  31.3C  Wrist  2.3   13.2  Wrist Base 1st Digit     Ulnar Anti Sensory (5th Digit)  31.1C  Wrist  *4.1   *10.1  Wrist 5th Digit  *34    Motor Left/Right Comparison   Stim Site L Lat (ms) R Lat (ms) L-R Lat (ms) L Amp (mV) R Amp (mV) L-R Amp (%) Site1 Site2 L Vel (m/s) R Vel (m/s) L-R Vel (m/s)  Median Motor (Abd Poll Brev)  31.9C  Wrist *8.8 *10.8 *2.0 *3.8 *0.0 *100.0 Elbow Wrist *37 *17 *20  Elbow 14.8 24.5 9.7 3.5 0.3 91.4       Ulnar Motor (Abd Dig Min)  31.9C  Wrist  4.1   7.4  B Elbow Wrist  *50   B Elbow  8.8   7.2  A Elbow B Elbow  64   A Elbow  10.2   6.8           Waveforms:

## 2018-03-06 ENCOUNTER — Ambulatory Visit (INDEPENDENT_AMBULATORY_CARE_PROVIDER_SITE_OTHER): Payer: Medicare HMO

## 2018-03-06 ENCOUNTER — Encounter (INDEPENDENT_AMBULATORY_CARE_PROVIDER_SITE_OTHER): Payer: Self-pay | Admitting: Orthopaedic Surgery

## 2018-03-06 ENCOUNTER — Ambulatory Visit (INDEPENDENT_AMBULATORY_CARE_PROVIDER_SITE_OTHER): Payer: Medicare HMO | Admitting: Orthopaedic Surgery

## 2018-03-06 ENCOUNTER — Other Ambulatory Visit (INDEPENDENT_AMBULATORY_CARE_PROVIDER_SITE_OTHER): Payer: Self-pay | Admitting: Radiology

## 2018-03-06 VITALS — BP 102/62 | HR 68 | Ht 67.5 in | Wt 230.0 lb

## 2018-03-06 DIAGNOSIS — G8929 Other chronic pain: Secondary | ICD-10-CM

## 2018-03-06 DIAGNOSIS — M25511 Pain in right shoulder: Principal | ICD-10-CM

## 2018-03-06 DIAGNOSIS — M25521 Pain in right elbow: Secondary | ICD-10-CM

## 2018-03-06 NOTE — Progress Notes (Signed)
Office Visit Note   Patient: Daniel [name]           Date of Birth: 1950-10-19           MRN: [id number] Visit Date: 03/06/2018              Requested by: Wendie Agreste, MD [address] [place], [place] [address] PCP: Wendie Agreste, MD   Assessment & Plan: Visit Diagnoses:  1. Acute pain of right shoulder   2. Pain in right elbow     Plan: EMGs and nerve conduction studies consistent with severe bilateral right worse than left median nerve entrapment at the wrists.  Discussion with Daniel [name] regarding the above.  I would suggest carpal tunnel release on the right.  Dr. Ernestina Patches who performed the studies noted that there is some polyneuropathy probably consistent with his diabetes and, therefore, may not have complete relief depression.  In addition Daniel [name] fell 3 days ago and now cannot raise his right arm above his head.  I will order an MRI scan with a concern that he is to had a rupture of his rotator cuff.  Office visit over 30 minutes 50% of time counseling discussing all of the above potential treatment options for both his carpal tunnel and his potential rotator cuff tear of the right shoulder  Follow-Up Instructions: Return MRI right shoulder, schedule CTS surgery right.   Orders:  Orders Placed This Encounter  Procedures  . XR Shoulder Right  . XR Elbow 2 Views Right   No orders of the defined types were placed in this encounter.     Procedures: No procedures performed   Clinical Data: No additional findings.   Subjective: Chief Complaint  Patient presents with  . Right Shoulder - Pain  . Right Elbow - Pain  . Shoulder Pain    Right shoulder, fall yesterday on concrete. limited ROM. difficulty lifting items if arm stays stiff. Ice/Heat some help. no swelling.   Marland Kitchen Elbow Pain    right elbow, fall yesterday. tender on posterior elbow, no swelling.  tramadol, ibuprofen.  . Follow-up    NCV with EMG, by Dr. Ernestina Patches 03/05/18   Daniel [name] returns for  evaluation of the numbness and tingling in both of his wrist.  Dr. Ernestina Patches performed EMGs and nerve conduction studies consistent with bilateral severe carpal tunnel syndrome worse on the right than the left.  He has suggested surgical release.  However, he does have an underlying polyneuropathy which may compromise any surgical results.  Mr. Oblinger is aware of the above.  He also fell down within the last several days landing on his right shoulder now cannot raise his arm over his head.  Films are negative.  I am concerned that he may have a rotator cuff tear. I am concerned that  he has a rotator cuff tear with a positive drop arm test and inability to raise his arm above his head HPI  Review of Systems   Objective: Vital Signs: BP 102/62 (BP Location: Left Arm, Patient Position: Sitting, Cuff Size: Normal)   Pulse 68   Ht 5' 7.5" (1.715 m)   Wt 230 lb (104.3 kg)   BMI 35.49 kg/m   Physical Exam  Constitutional: He is oriented to person, place, and time. He appears well-developed and well-nourished.  HENT:  Mouth/Throat: Oropharynx is clear and moist.  Eyes: Pupils are equal, round, and reactive to light. EOM are normal.  Pulmonary/Chest: Effort normal.  Neurological:  He is alert and oriented to person, place, and time.  Skin: Skin is warm and dry.  Psychiatric: He has a normal mood and affect. His behavior is normal.    Ortho Exam bilateral Tinel's at both wrists.  Able to oppose thumb to little finger with good strength bilaterally.  Some tingling in the radial 3 digits more on the right than the left.  All consistent with his diagnosis of carpal tunnel syndrome.  Daniel [name] had a fall within the last several days and is unable to raise his right arm above his head.  He has a positive drop arm test when I place his arm above his head.  No ecchymosis.  Biceps intact.  No crepitation.  Specialty Comments:  No specialty comments available.  Imaging: Xr Elbow 2 Views Right  Result Date:  03/06/2018 's of the right elbow obtained in 2 projections.  No evidence of a fat pad sign.  No obvious fracture.  No obvious arthritis  Xr Shoulder Right  Result Date: 03/06/2018 Films of the right shoulder obtained in several projections.  Daniel [name] had an injury several days ago and is unable to move his arm above his head.  No acute changes by film.  No evidence of a fracture.  Humeral head is centered about the glenoid.  Some arthritic changes of the acromioclavicular joint.  Normal space between the humeral head and the acromium    PMFS History: Patient Active Problem List   Diagnosis Date Noted  . Carpal tunnel syndrome, right upper limb 02/18/2018  . Anxiety and depression 10/28/2016  . Gastroesophageal reflux disease without esophagitis 10/28/2016  . Seasonal allergic rhinitis due to pollen 10/28/2016  . Primary osteoarthritis of left knee 07/25/2015  . Essential hypertension 07/25/2015  . Hypercholesteremia 07/25/2015  . Type 2 diabetes mellitus with diabetic polyneuropathy, without long-term current use of insulin ([place]) 07/25/2015   Past Medical History:  Diagnosis Date  . Allergic rhinitis   . Allergy   . Anxiety   . Arthritis   . Benign hypertension   . Cataract   . Depression   . Diabetes mellitus ([place])   . GERD (gastroesophageal reflux disease)   . History of pneumonia   . HLD (hyperlipidemia)   . Neuromuscular disorder ([place])     Family History  Problem Relation Age of Onset  . Diabetes Father   . Colon polyps Father   . Atrial fibrillation Mother   . Prostate cancer Paternal Uncle   . Cancer Sister   . Esophageal cancer Neg Hx   . Rectal cancer Neg Hx   . Stomach cancer Neg Hx     Past Surgical History:  Procedure Laterality Date  . CATARACT EXTRACTION     bilateral  . EYE SURGERY    . KNEE SURGERY     right   Social History   Occupational History  . Occupation: Dealer, Company secretary  Tobacco Use  . Smoking status: Never Smoker  .  Smokeless tobacco: Never Used  Substance and Sexual Activity  . Alcohol use: No  . Drug use: No  . Sexual activity: Not Currently     Garald Balding, MD   Note - This record has been created using [employer].  Chart creation errors have been sought, but may not always  have been located. Such creation errors do not reflect on  the standard of medical care.

## 2018-03-10 ENCOUNTER — Telehealth (INDEPENDENT_AMBULATORY_CARE_PROVIDER_SITE_OTHER): Payer: Self-pay | Admitting: Orthopaedic Surgery

## 2018-03-10 NOTE — Telephone Encounter (Signed)
Patient will contact me in about 30 days regarding the right carpal tunnel surgery.  He states his right shoulder is feeling better but is being scheduled for an MRI and should he have a rotator cuff tear he may do both the wrist and shoulder surgery at the same time.  Patient has my name and direct number for scheduling.

## 2018-03-11 ENCOUNTER — Ambulatory Visit (INDEPENDENT_AMBULATORY_CARE_PROVIDER_SITE_OTHER): Payer: Medicare HMO | Admitting: Orthopaedic Surgery

## 2018-03-14 ENCOUNTER — Telehealth (INDEPENDENT_AMBULATORY_CARE_PROVIDER_SITE_OTHER): Payer: Self-pay | Admitting: Orthopaedic Surgery

## 2018-03-14 NOTE — Telephone Encounter (Signed)
Patient called stating he has full ROM of his shoulder so he has decided not to get the MRI at this time.  Patient states he will call back when he is ready to schedule the carpal tunnel surgery.

## 2018-03-14 NOTE — Telephone Encounter (Signed)
thanks

## 2018-03-14 NOTE — Telephone Encounter (Signed)
JUST AN FYI 

## 2018-03-25 DIAGNOSIS — D235 Other benign neoplasm of skin of trunk: Secondary | ICD-10-CM | POA: Diagnosis not present

## 2018-03-25 DIAGNOSIS — L814 Other melanin hyperpigmentation: Secondary | ICD-10-CM | POA: Diagnosis not present

## 2018-03-25 DIAGNOSIS — L720 Epidermal cyst: Secondary | ICD-10-CM | POA: Diagnosis not present

## 2018-03-25 DIAGNOSIS — D0439 Carcinoma in situ of skin of other parts of face: Secondary | ICD-10-CM | POA: Diagnosis not present

## 2018-03-25 DIAGNOSIS — D2372 Other benign neoplasm of skin of left lower limb, including hip: Secondary | ICD-10-CM | POA: Diagnosis not present

## 2018-03-25 DIAGNOSIS — L821 Other seborrheic keratosis: Secondary | ICD-10-CM | POA: Diagnosis not present

## 2018-04-09 DIAGNOSIS — D0439 Carcinoma in situ of skin of other parts of face: Secondary | ICD-10-CM | POA: Diagnosis not present

## 2018-04-15 ENCOUNTER — Other Ambulatory Visit: Payer: Self-pay | Admitting: Family Medicine

## 2018-04-15 DIAGNOSIS — G8929 Other chronic pain: Secondary | ICD-10-CM

## 2018-04-15 DIAGNOSIS — M25562 Pain in left knee: Principal | ICD-10-CM

## 2018-04-15 DIAGNOSIS — M25561 Pain in right knee: Principal | ICD-10-CM

## 2018-04-15 NOTE — Telephone Encounter (Signed)
Requested medication (s) are due for refill today:   Yes  Requested medication (s) are on the active medication list:   Yes  Future visit scheduled:   No  Pt of Dr. Carlota Raspberry   Last ordered: 03/05/18  #120  0 refills.   Requested Prescriptions  Pending Prescriptions Disp Refills   traMADol (ULTRAM) 50 MG tablet [Pharmacy Med Name: TRAMADOL 50MG  TAB] 120 tablet 0    Sig: TAKE 1 TABLET BY MOUTH EVERY 6 HOURS AS NEEDED     Not Delegated - Analgesics:  Opioid Agonists Failed - 04/15/2018  8:26 AM      Failed - This refill cannot be delegated      Failed - Urine Drug Screen completed in last 360 days.      Passed - Valid encounter within last 6 months    Recent Outpatient Visits          2 months ago Type 2 diabetes mellitus with diabetic neuropathy, without long-term current use of insulin ([place])   [facility], Ranell Patrick, MD   3 months ago Type 2 diabetes mellitus with hyperglycemia, without long-term current use of insulin [facility])   [facility], Ranell Patrick, MD   5 months ago Type 2 diabetes mellitus with diabetic polyneuropathy, without long-term current use of insulin [facility])   [facility], Ranell Patrick, MD   1 year ago Type 2 diabetes mellitus with diabetic polyneuropathy, without long-term current use of insulin [facility])   [facility] at [facility], Renette Butters, MD   1 year ago Type 2 diabetes mellitus with diabetic polyneuropathy, without long-term current use of insulin [facility])   [facility] at [facility], Renette Butters, MD

## 2018-04-15 NOTE — Telephone Encounter (Signed)
Patient is requesting a refill of the following medications: Requested Prescriptions   Pending Prescriptions Disp Refills  . traMADol (ULTRAM) 50 MG tablet [Pharmacy Med Name: TRAMADOL 50MG  TAB] 120 tablet 0    Sig: TAKE 1 TABLET BY MOUTH EVERY 6 HOURS AS NEEDED    Date of patient request: 04/15/2018 Last office visit: 02/11/2018 Date of last refill: 03/02/2018 Last refill amount: 120 Follow up time period per chart:

## 2018-04-16 NOTE — Telephone Encounter (Signed)
Discussed in July.  Refilled.

## 2018-05-21 ENCOUNTER — Other Ambulatory Visit: Payer: Self-pay | Admitting: Family Medicine

## 2018-05-21 DIAGNOSIS — M25561 Pain in right knee: Principal | ICD-10-CM

## 2018-05-21 DIAGNOSIS — G8929 Other chronic pain: Secondary | ICD-10-CM

## 2018-05-21 DIAGNOSIS — M25562 Pain in left knee: Principal | ICD-10-CM

## 2018-05-21 NOTE — Telephone Encounter (Signed)
Requested medication (s) are due for refill today: yes  Requested medication (s) are on the active medication list: yes  Last refill:  04/16/18 #120  Future visit scheduled: No  Notes to clinic:  Unable to fill per protocol    Requested Prescriptions  Pending Prescriptions Disp Refills   traMADol (ULTRAM) 50 MG tablet [Pharmacy Med Name: TRAMADOL 50MG  TAB] 120 tablet 0    Sig: TAKE 1 TABLET BY MOUTH EVERY 6 HOURS AS NEEDED     Not Delegated - Analgesics:  Opioid Agonists Failed - 05/21/2018  8:31 AM      Failed - This refill cannot be delegated      Failed - Urine Drug Screen completed in last 360 days.      Passed - Valid encounter within last 6 months    Recent Outpatient Visits          3 months ago Type 2 diabetes mellitus with diabetic neuropathy, without long-term current use of insulin ([place])   [facility], Ranell Patrick, MD   4 months ago Type 2 diabetes mellitus with hyperglycemia, without long-term current use of insulin [facility])   [facility], Ranell Patrick, MD   6 months ago Type 2 diabetes mellitus with diabetic polyneuropathy, without long-term current use of insulin [facility])   [facility], Ranell Patrick, MD   1 year ago Type 2 diabetes mellitus with diabetic polyneuropathy, without long-term current use of insulin [facility])   [facility] at [facility], Renette Butters, MD   1 year ago Type 2 diabetes mellitus with diabetic polyneuropathy, without long-term current use of insulin [facility])   [facility] Care at [facility], Renette Butters, MD

## 2018-05-22 NOTE — Telephone Encounter (Signed)
tramadol Refilled, but due for follow-up for diabetes and can discuss knee pain and plan as well.   Please schedule appointment in next 6 weeks if possible.

## 2018-05-22 NOTE — Telephone Encounter (Signed)
Patient is requesting a refill of the following medications: Requested Prescriptions   Pending Prescriptions Disp Refills  . traMADol (ULTRAM) 50 MG tablet [Pharmacy Med Name: TRAMADOL 50MG  TAB] 120 tablet 0    Sig: TAKE 1 TABLET BY MOUTH EVERY 6 HOURS AS NEEDED    Date of patient request: 05/21/2018 Last office visit: 02/11/2018 Date of last refill: 04/16/2018 Last refill amount: 120 Follow up time period per chart:

## 2018-06-23 ENCOUNTER — Other Ambulatory Visit: Payer: Self-pay | Admitting: Family Medicine

## 2018-06-23 DIAGNOSIS — M25561 Pain in right knee: Principal | ICD-10-CM

## 2018-06-23 DIAGNOSIS — G8929 Other chronic pain: Secondary | ICD-10-CM

## 2018-06-23 DIAGNOSIS — M25562 Pain in left knee: Principal | ICD-10-CM

## 2018-06-23 NOTE — Telephone Encounter (Signed)
See last message for refill in November. I refilled ultram, but please schedule appt. Controlled substance database (PDMP) reviewed. No concerns appreciated.

## 2018-06-23 NOTE — Telephone Encounter (Signed)
Patient is requesting a refill of the following medications: Requested Prescriptions   Pending Prescriptions Disp Refills  . traMADol (ULTRAM) 50 MG tablet [Pharmacy Med Name: traMADol HCl 50 MG Oral Tablet] 120 tablet 0    Sig: TAKE 1 TABLET BY MOUTH EVERY 6 HOURS AS NEEDED    Date of patient request: 06/23/2018 Last office visit: 02/11/2018 Date of last refill: 05/22/2018 Last refill amount: 120 Follow up time period per chart:

## 2018-06-24 NOTE — Telephone Encounter (Signed)
Called and made an appt with pt for 07/29/18 at 9:00 am with Dr. Carlota Raspberry. I advised pt of time, building and late policy. Pt acknowledged.

## 2018-07-12 ENCOUNTER — Ambulatory Visit: Payer: Medicare HMO | Admitting: Osteopathic Medicine

## 2018-07-14 ENCOUNTER — Ambulatory Visit (INDEPENDENT_AMBULATORY_CARE_PROVIDER_SITE_OTHER): Payer: Medicare Other | Admitting: Family Medicine

## 2018-07-14 DIAGNOSIS — Z23 Encounter for immunization: Secondary | ICD-10-CM

## 2018-07-18 ENCOUNTER — Telehealth (INDEPENDENT_AMBULATORY_CARE_PROVIDER_SITE_OTHER): Payer: Self-pay | Admitting: Orthopaedic Surgery

## 2018-07-18 NOTE — Telephone Encounter (Signed)
I called patient, surgery sheet on your desk.

## 2018-07-18 NOTE — Telephone Encounter (Signed)
Patient called stating he had appointment with Dr. Durward Fortes on 03/06/18 and was told to contact the office when he was ready to schedule his right carpal tunnel surgery.  Patient requested a return call.

## 2018-07-21 NOTE — Telephone Encounter (Signed)
Sheet completed

## 2018-07-29 ENCOUNTER — Encounter: Payer: Self-pay | Admitting: Family Medicine

## 2018-07-29 ENCOUNTER — Ambulatory Visit (INDEPENDENT_AMBULATORY_CARE_PROVIDER_SITE_OTHER): Payer: Medicare Other | Admitting: Family Medicine

## 2018-07-29 ENCOUNTER — Other Ambulatory Visit: Payer: Self-pay

## 2018-07-29 VITALS — BP 143/74 | HR 58 | Temp 97.7°F | Resp 14 | Ht 68.0 in | Wt 242.4 lb

## 2018-07-29 DIAGNOSIS — M25561 Pain in right knee: Secondary | ICD-10-CM | POA: Diagnosis not present

## 2018-07-29 DIAGNOSIS — E7800 Pure hypercholesterolemia, unspecified: Secondary | ICD-10-CM | POA: Diagnosis not present

## 2018-07-29 DIAGNOSIS — F329 Major depressive disorder, single episode, unspecified: Secondary | ICD-10-CM | POA: Diagnosis not present

## 2018-07-29 DIAGNOSIS — F32A Depression, unspecified: Secondary | ICD-10-CM

## 2018-07-29 DIAGNOSIS — I10 Essential (primary) hypertension: Secondary | ICD-10-CM | POA: Diagnosis not present

## 2018-07-29 DIAGNOSIS — G8929 Other chronic pain: Secondary | ICD-10-CM

## 2018-07-29 DIAGNOSIS — Z299 Encounter for prophylactic measures, unspecified: Secondary | ICD-10-CM

## 2018-07-29 DIAGNOSIS — M25562 Pain in left knee: Secondary | ICD-10-CM

## 2018-07-29 DIAGNOSIS — E1142 Type 2 diabetes mellitus with diabetic polyneuropathy: Secondary | ICD-10-CM

## 2018-07-29 MED ORDER — BLOOD GLUCOSE METER KIT: 1.0000 | PACK | 0 refills | Status: DC

## 2018-07-29 MED ORDER — LIRAGLUTIDE 18 MG/3ML ~~LOC~~ SOPN: 1.8000 mg | PEN_INJECTOR | Freq: Every day | SUBCUTANEOUS | 5 refills | Status: DC

## 2018-07-29 MED ORDER — GLIMEPIRIDE 4 MG PO TABS: 4.0000 mg | ORAL_TABLET | Freq: Two times a day (BID) | ORAL | 3 refills | Status: DC

## 2018-07-29 MED ORDER — PRAVASTATIN SODIUM 80 MG PO TABS: 80.0000 mg | ORAL_TABLET | Freq: Every day | ORAL | 1 refills | Status: DC

## 2018-07-29 MED ORDER — METFORMIN HCL 1000 MG PO TABS: 1000.0000 mg | ORAL_TABLET | Freq: Two times a day (BID) | ORAL | 3 refills | Status: DC

## 2018-07-29 MED ORDER — GABAPENTIN 300 MG PO CAPS: 300.0000 mg | ORAL_CAPSULE | Freq: Four times a day (QID) | ORAL | 3 refills | Status: DC | PRN

## 2018-07-29 MED ORDER — SERTRALINE HCL 100 MG PO TABS: 200.0000 mg | ORAL_TABLET | Freq: Every day | ORAL | 1 refills | Status: DC

## 2018-07-29 MED ORDER — TRAMADOL HCL 50 MG PO TABS: 50.0000 mg | ORAL_TABLET | Freq: Four times a day (QID) | ORAL | 0 refills | Status: DC | PRN

## 2018-07-29 MED ORDER — LISINOPRIL 20 MG PO TABS: 20.0000 mg | ORAL_TABLET | Freq: Every day | ORAL | 3 refills | Status: DC

## 2018-07-29 NOTE — Patient Instructions (Addendum)
No medication changes at this time.  If pharmacist able to find a less expensive option in the same class as Victoza, let me know.  Follow-up as planned for wellness exam and we can do memory screening at that time.   Tips for Eating Away From Home If You Have Diabetes Controlling your blood sugar (glucose) levels can be challenging when you do not prepare your own meals. The following tips can help you manage your diabetes when you eat away from home. If you have questions or if you need help, work with your health care provider or diet and nutrition specialist (dietitian). Planning ahead Plan ahead if you know you will be eating away from home:  Try to eat your meals and snacks at about the same time each day. If you know your meal is going to be later than normal, make sure you have a small snack. Being very hungry can cause you to make unhealthy food choices.  Make a list of restaurants near you that offer healthy choices. If a restaurant has a carry-out menu, take the menu home and plan what you will order ahead of time.  Look up the restaurant you want to eat at online. Many chain and fast-food restaurants list nutritional information online. Use this information to choose the healthiest options and to calculate how many carbohydrates will be in your meal.  Use a carbohydrate-counting book or mobile app to look up the carbohydrate content and serving size of the foods you want to eat. Free foods A "free food" is any food or drink that has less than 5 grams of carbohydrates and less than 20 calories per serving. These food are high in fiber and nutrients and low in calories, carbohydrates, and fats. Free foods include:  Non-starchy vegetables, such as carrots, broccoli, celery, lettuce, or green beans.  Non-sugar drinks, such as water, unsweetened coffee, or unsweetened tea.  Low-calorie salad dressings.  Sugar-free gelatin. Starting meals with a salad full of vegetables is a healthy  choice that includes a lot of free foods. Avoid high-calorie salad toppings like bacon, cheese, and high-fat dressings. Ask for your salad dressing to be served on the side so that you dip your fork in the dressing and then in the salad. This allows you to control how much dressing you eat and still get the flavor with every bite. Choices to control carbohydrates   Ask your server to take away the bread basket or chips from your table.  Choose light yogurt or [place] yogurt instead of non-fat sweetened yogurt.  Order fresh fruit. A salad bar often offers fresh fruit choices. Avoid canned fruit because it is usually packed in sugar or syrup.  Order a salad, and ask for dressing on the side.  Ask for substitutes. For example, if your meal comes with french fries, ask for a side salad or steamed veggies instead. If a meal comes with fried chicken, ask for grilled chicken instead. Beverages  Choose drinks that are low in calories and sugar, such as: ? Water. ? Unsweetened tea or coffee. ? Lowfat milk.  Avoid the following drinks: ? Alcoholic beverages. ? Regular (not diet) sodas. Other tips  If you take insulin, wait to take your insulin once your food arrives to your table. This will ensure that your insulin and your food are timed correctly.  Become familiar with serving sizes and learn to recognize how many servings are in a portion. Restaurant portions are typically two to three times larger  than what you really need.  Ask your server for a to-go box at the beginning of the meal. When your food comes, leave the amount you should have on your plate, and put the rest in the to-go box so that you are not tempted to eat too much.  Consider splitting an entree with someone and ordering a side salad.  Avoid buffets. They are typically too tempting and result in overeating. Where to find more information  American Diabetes Association: www.diabetes.org  American Association of Diabetes  Educators: www.diabeteseducator.org Summary  Plan ahead when eating away from home.  Try to eat your meals and snacks at about the same time each day. If you know your meal is going to be later than normal, make sure you have a small snack. Being very hungry can cause you to make unhealthy food choices.  Ask for substitutes. For example, if your meal comes with french fries, ask for a side salad or steamed veggies instead. If a meal comes with fried chicken, ask for grilled chicken instead.  Ask for a to-go box when you order your meal. Divide your meal before you start eating. This information is not intended to replace advice given to you by your health care provider. Make sure you discuss any questions you have with your health care provider. Document Released: 06/18/2005 Document Revised: 09/26/2016 Document Reviewed: 09/26/2016 Elsevier Interactive Patient Education  2019 [place].   Diabetes Mellitus and Nutrition, Adult When you have diabetes (diabetes mellitus), it is very important to have healthy eating habits because your blood sugar (glucose) levels are greatly affected by what you eat and drink. Eating healthy foods in the appropriate amounts, at about the same times every day, can help you:  Control your blood glucose.  Lower your risk of heart disease.  Improve your blood pressure.  Reach or maintain a healthy weight. Every person with diabetes is different, and each person has different needs for a meal plan. Your health care provider may recommend that you work with a diet and nutrition specialist (dietitian) to make a meal plan that is best for you. Your meal plan may vary depending on factors such as:  The calories you need.  The medicines you take.  Your weight.  Your blood glucose, blood pressure, and cholesterol levels.  Your activity level.  Other health conditions you have, such as heart or kidney disease. How do carbohydrates affect  me? Carbohydrates, also called carbs, affect your blood glucose level more than any other type of food. Eating carbs naturally raises the amount of glucose in your blood. Carb counting is a method for keeping track of how many carbs you eat. Counting carbs is important to keep your blood glucose at a healthy level, especially if you use insulin or take certain oral diabetes medicines. It is important to know how many carbs you can safely have in each meal. This is different for every person. Your dietitian can help you calculate how many carbs you should have at each meal and for each snack. Foods that contain carbs include:  Bread, cereal, rice, pasta, and crackers.  Potatoes and corn.  Peas, beans, and lentils.  Milk and yogurt.  Fruit and juice.  Desserts, such as cakes, cookies, ice cream, and candy. How does alcohol affect me? Alcohol can cause a sudden decrease in blood glucose (hypoglycemia), especially if you use insulin or take certain oral diabetes medicines. Hypoglycemia can be a life-threatening condition. Symptoms of hypoglycemia (sleepiness, dizziness, and  confusion) are similar to symptoms of having too much alcohol. If your health care provider says that alcohol is safe for you, follow these guidelines:  Limit alcohol intake to no more than 1 drink per day for nonpregnant women and 2 drinks per day for men. One drink equals 12 oz of beer, 5 oz of wine, or 1 oz of hard liquor.  Do not drink on an empty stomach.  Keep yourself hydrated with water, diet soda, or unsweetened iced tea.  Keep in mind that regular soda, juice, and other mixers may contain a lot of sugar and must be counted as carbs. What are tips for following this plan?  Reading food labels  Start by checking the serving size on the "Nutrition Facts" label of packaged foods and drinks. The amount of calories, carbs, fats, and other nutrients listed on the label is based on one serving of the item. Many items  contain more than one serving per package.  Check the total grams (g) of carbs in one serving. You can calculate the number of servings of carbs in one serving by dividing the total carbs by 15. For example, if a food has 30 g of total carbs, it would be equal to 2 servings of carbs.  Check the number of grams (g) of saturated and trans fats in one serving. Choose foods that have low or no amount of these fats.  Check the number of milligrams (mg) of salt (sodium) in one serving. Most people should limit total sodium intake to less than 2,300 mg per day.  Always check the nutrition information of foods labeled as "low-fat" or "nonfat". These foods may be higher in added sugar or refined carbs and should be avoided.  Talk to your dietitian to identify your daily goals for nutrients listed on the label. Shopping  Avoid buying canned, premade, or processed foods. These foods tend to be high in fat, sodium, and added sugar.  Shop around the outside edge of the grocery store. This includes fresh fruits and vegetables, bulk grains, fresh meats, and fresh dairy. Cooking  Use low-heat cooking methods, such as baking, instead of high-heat cooking methods like deep frying.  Cook using healthy oils, such as olive, canola, or sunflower oil.  Avoid cooking with butter, cream, or high-fat meats. Meal planning  Eat meals and snacks regularly, preferably at the same times every day. Avoid going long periods of time without eating.  Eat foods high in fiber, such as fresh fruits, vegetables, beans, and whole grains. Talk to your dietitian about how many servings of carbs you can eat at each meal.  Eat 4-6 ounces (oz) of lean protein each day, such as lean meat, chicken, fish, eggs, or tofu. One oz of lean protein is equal to: ? 1 oz of meat, chicken, or fish. ? 1 egg. ?  cup of tofu.  Eat some foods each day that contain healthy fats, such as avocado, nuts, seeds, and fish. Lifestyle  Check your  blood glucose regularly.  Exercise regularly as told by your health care provider. This may include: ? 150 minutes of moderate-intensity or vigorous-intensity exercise each week. This could be brisk walking, biking, or water aerobics. ? Stretching and doing strength exercises, such as yoga or weightlifting, at least 2 times a week.  Take medicines as told by your health care provider.  Do not use any products that contain nicotine or tobacco, such as cigarettes and e-cigarettes. If you need help quitting, ask your health  care provider.  Work with a Social worker or diabetes educator to identify strategies to manage stress and any emotional and social challenges. Questions to ask a health care provider  Do I need to meet with a diabetes educator?  Do I need to meet with a dietitian?  What number can I call if I have questions?  When are the best times to check my blood glucose? Where to find more information:  American Diabetes Association: diabetes.org  Academy of Nutrition and Dietetics: www.eatright.[employer] of Diabetes and Digestive and Kidney Diseases (NIH): [employer] Summary  A healthy meal plan will help you control your blood glucose and maintain a healthy lifestyle.  Working with a diet and nutrition specialist (dietitian) can help you make a meal plan that is best for you.  Keep in mind that carbohydrates (carbs) and alcohol have immediate effects on your blood glucose levels. It is important to count carbs and to use alcohol carefully. This information is not intended to replace advice given to you by your health care provider. Make sure you discuss any questions you have with your health care provider. Document Released: 03/15/2005 Document Revised: 01/16/2017 Document Reviewed: 07/23/2016 Elsevier Interactive Patient Education  [employer].    If you have lab work done today you will be contacted with your lab results within the next 2  weeks.  If you have not heard from [place] then please contact us. The fastest way to get your results is to register for My Chart.   IF you received an x-ray today, you will receive an invoice from [facility]. Please contact [facility] at [phone] with questions or concerns regarding your invoice.   IF you received labwork today, you will receive an invoice from [facility]. Please contact [facility] at [phone] with questions or concerns regarding your invoice.   Our billing staff will not be able to assist you with questions regarding bills from these companies.  You will be contacted with the lab results as soon as they are available. The fastest way to get your results is to activate your My Chart account. Instructions are located on the last page of this paperwork. If you have not heard from [place] regarding the results in 2 weeks, please contact this office.

## 2018-07-29 NOTE — Progress Notes (Signed)
Subjective:    Patient ID: Daniel [name], male    DOB: 05/13/1951, 68 y.o.   MRN: [id number]  HPI Daniel [name] is a 68 y.o. male Presents today for: Chief Complaint  Patient presents with  . Knee Pain    Needed to be seen to get a refill on  Tramdol  . Diabetes    need to get a new rx for glucose meter. The ins will not pay for the one I currently have    Diabetes: Last seen in August 2019, recommended 40-monthfollow-up at that time.  Improved A1c from 9.2-7.8 but still elevated. Diet control was discussed.  Continue same medications: Glimepiride 4 mg twice daily, metformin 1000 mg twice daily, Victoza 1.846mQD.  Did not fill Victoza last month d/t cost (donut hole). Restarted few weeks ago after being off for 1 month.  Has been adherent to other 2 meds. Cost is ok now.  Home reading 85 at 6am today fasting. 2hr PP - 145-175.  No symptomatic lows.   Weight increase 12 pounds since last visit. Plans on starting recumbent bike.  Has worked less since Thanksgiving d/t carpal tunnel syndrome. Waiting on scheduler for CTS surgery - Dr. WhDurward Fortes Takes gabapentin 300 to 600 mg 4 times daily for peripheral neuropathy. Taking 2 pills at night only at this time - controlling feet symptoms.   Microalbumin: Normal in 2018.  No recent testing Optho, foot exam, pneumovax: up to date.  On ASA, Ace-I and statin.    Wt Readings from Last 3 Encounters:  07/29/18 242 lb 6.4 oz (110 kg)  03/06/18 230 lb (104.3 kg)  02/18/18 235 lb (106.6 kg)    Lab Results  Component Value Date   HGBA1C 7.8 (A) 02/11/2018   HGBA1C 9.2 (H) 11/14/2017   HGBA1C 8.4 (H) 04/08/2017   Lab Results  Component Value Date   MICROALBUR 15.1 05/03/2015   LDLCALC 103 (H) 11/14/2017   CREATININE 1.06 11/14/2017   Depression: zoloft QD and gabapentin have helped sleep. Feels like depression is controlled.  Dep[facility] screen [facility]/9 07/29/2018 02/11/2018 12/31/2017 11/14/2017 04/08/2017  Decreased Interest 0 0 0 0 0    Down, Depressed, Hopeless 0 0 0 0 0  PHQ - 2 Score 0 0 0 0 0    Hyperlipidemia:  Lab Results  Component Value Date   CHOL 208 (H) 11/14/2017   HDL 62 11/14/2017   LDLCALC 103 (H) 11/14/2017   TRIG 215 (H) 11/14/2017   CHOLHDL 3.4 11/14/2017   Lab Results  Component Value Date   ALT 26 11/14/2017   AST 23 11/14/2017   ALKPHOS 81 11/14/2017   BILITOT <0.2 11/14/2017  Pravastatin 80 mg daily, no new myalgias/side effects.   Hypertension: BP Readings from Last 3 Encounters:  07/29/18 (!) 143/74  03/06/18 102/62  02/18/18 132/73   Lab Results  Component Value Date   CREATININE 1.06 11/14/2017  Lisinopril 20 mg daily.  Previously had been stable. No missed doses known.  Home readings in 130's.   Chronic knee pain: See previous visits. bilateral knee pain -R knee is worse  Has been evaluated by orthopedics in the past including Visco supplementation.  Replacement discussed but was not planning on having that done yet due to cost, time with recovery. Thinking about this in future.  No new symptoms. Pain is been managed with tramadol 2/day constantly but up to 4 per day based on pain. Controlled substance database (PDMP) reviewed. No concerns appreciated.  Last filled for #120 on 06/24/2018, previously # 120 on 05/23/2018, and 04/16/2018.    Patient Active Problem List   Diagnosis Date Noted  . Carpal tunnel syndrome, right upper limb 02/18/2018  . Anxiety and depression 10/28/2016  . Gastroesophageal reflux disease without esophagitis 10/28/2016  . Seasonal allergic rhinitis due to pollen 10/28/2016  . Primary osteoarthritis of left knee 07/25/2015  . Essential hypertension 07/25/2015  . Hypercholesteremia 07/25/2015  . Type 2 diabetes mellitus with diabetic polyneuropathy, without long-term current use of insulin ([place]) 07/25/2015   Past Medical History:  Diagnosis Date  . Allergic rhinitis   . Allergy   . Anxiety   . Arthritis   . Benign hypertension   . Cataract    . Depression   . Diabetes mellitus ([place])   . GERD (gastroesophageal reflux disease)   . History of pneumonia   . HLD (hyperlipidemia)   . Neuromuscular disorder [facility])    Past Surgical History:  Procedure Laterality Date  . CATARACT EXTRACTION     bilateral  . EYE SURGERY    . KNEE SURGERY     right   Allergies  Allergen Reactions  . Actos [Pioglitazone] Other (See Comments), Shortness Of Breath and Swelling    Blood sugar went up and down and sweating profusely  . Codeine Nausea Only and Palpitations    Palpitations, SOB   Prior to Admission medications   Medication Sig Start Date End Date Taking? Authorizing Provider  albuterol (PROVENTIL HFA;VENTOLIN HFA) 108 (90 Base) MCG/ACT inhaler Inhale 2 puffs into the lungs every 4 (four) hours as needed for wheezing or shortness of breath. Reported on 10/19/2015 10/19/15  Yes Shawnee Knapp, MD  aspirin 81 MG tablet Take 81 mg by mouth daily.   Yes [provider]  blood glucose meter kit and supplies by Other route as directed. Dispense based on patient and insurance preference. Use up to four times daily as directed. (FOR ICD-10 E10.9, E11.9).   Yes [provider]  budesonide-formoterol (SYMBICORT) 80-4.5 MCG/ACT inhaler Inhale 2 puffs into the lungs 2 (two) times daily. 09/13/16  Yes Wardell Honour, MD  cetirizine (ZYRTEC) 10 MG tablet Take 10 mg by mouth daily.   Yes [provider]  Cyanocobalamin (VITAMIN B 12 PO) Take 1,000 mg by mouth daily.   Yes [provider]  gabapentin (NEURONTIN) 300 MG capsule Take 1-2 capsules (300-600 mg total) by mouth 4 (four) times daily. 11/14/17  Yes Wendie Agreste, MD  glimepiride (AMARYL) 4 MG tablet Take 1 tablet (4 mg total) by mouth 2 (two) times daily. 11/14/17  Yes Wendie Agreste, MD  glucose blood (ONE TOUCH ULTRA TEST) test strip Test Twice a day as directed 10/27/14  Yes [provider]  glucose blood test strip check sugars once daily. Dx:  E11.42 11/14/17  Yes Wendie Agreste, MD  ibuprofen (ADVIL,MOTRIN) 200 MG tablet Take 800 mg by mouth every morning.   Yes [provider]  liraglutide (VICTOZA) 18 MG/3ML SOPN Inject 0.3 mLs (1.8 mg total) into the skin daily. Inject 0.60m daily for two weeks; then increase to 1.279m5/16/19  Yes GrWendie AgresteMD  lisinopril (PRINIVIL,ZESTRIL) 20 MG tablet Take 1 tablet (20 mg total) by mouth daily. 11/14/17  Yes GrWendie AgresteMD  metFORMIN (GLUCOPHAGE) 1000 MG tablet Take 1 tablet (1,000 mg total) by mouth 2 (two) times daily with a meal. 11/14/17  Yes GrWendie AgresteMD  montelukast (SINGULAIR) 10 MG  tablet Take 1 tablet (10 mg total) by mouth at bedtime. 11/14/17  Yes Wendie Agreste, MD  omeprazole (PRILOSEC) 20 MG capsule Take 1 capsule (20 mg total) by mouth daily. 11/14/17  Yes Wendie Agreste, MD  pravastatin (PRAVACHOL) 80 MG tablet Take 1 tablet (80 mg total) by mouth daily. 11/14/17  Yes Wendie Agreste, MD  sertraline (ZOLOFT) 100 MG tablet Take 2 tablets (200 mg total) by mouth daily. Start with one per day for initial 2 weeks, then increase to 2 per day. 11/14/17  Yes Wendie Agreste, MD  traMADol (ULTRAM) 50 MG tablet TAKE 1 TABLET BY MOUTH EVERY 6 HOURS AS NEEDED 06/23/18  Yes Wendie Agreste, MD   Social History   Socioeconomic History  . Marital status: Married    Spouse name: Not on file  . Number of children: 3  . Years of education: Not on file  . Highest education level: Not on file  Occupational History  . Occupation: Dealer, Animal nutritionist Needs  . Financial resource strain: Not on file  . Food insecurity:    Worry: Not on file    Inability: Not on file  . Transportation needs:    Medical: Not on file    Non-medical: Not on file  Tobacco Use  . Smoking status: Never Smoker  . Smokeless tobacco: Never Used  Substance and Sexual Activity  . Alcohol use: No  . Drug use: No  . Sexual activity: Not Currently  Lifestyle  .  Physical activity:    Days per week: Not on file    Minutes per session: Not on file  . Stress: Not on file  Relationships  . Social connections:    Talks on phone: Not on file    Gets together: Not on file    Attends religious service: Not on file    Active member of club or organization: Not on file    Attends meetings of clubs or organizations: Not on file    Relationship status: Not on file  . Intimate partner violence:    Fear of current or ex partner: Not on file    Emotionally abused: Not on file    Physically abused: Not on file    Forced sexual activity: Not on file  Other Topics Concern  . Not on file  Social History Narrative  . Not on file    Review of Systems  Constitutional: Negative for fatigue and unexpected weight change.  Eyes: Negative for visual disturbance.  Respiratory: Negative for cough, chest tightness and shortness of breath.   Cardiovascular: Negative for chest pain, palpitations and leg swelling.  Gastrointestinal: Negative for abdominal pain and blood in stool.  Neurological: Negative for dizziness, light-headedness and headaches.       Objective:   Physical Exam Vitals signs and nursing note reviewed.  Constitutional:      Appearance: He is well-developed.  HENT:     Head: Normocephalic and atraumatic.  Eyes:     Pupils: Pupils are equal, round, and reactive to light.  Neck:     Vascular: No carotid bruit or JVD.  Cardiovascular:     Rate and Rhythm: Normal rate and regular rhythm.     Heart sounds: Normal heart sounds. No murmur.  Pulmonary:     Effort: Pulmonary effort is normal.     Breath sounds: Normal breath sounds. No rales.  Musculoskeletal:     Comments: Knees - FROM, no focal ttp, crepitance bilaterally.  Skin:    General: Skin is warm and dry.  Neurological:     Mental Status: He is alert and oriented to person, place, and time.    Vitals:   07/29/18 0854 07/29/18 0855  BP: (!) 154/78 (!) 143/74  Pulse: (!) 58    Resp: 14   Temp: 97.7 F (36.5 C)   TempSrc: Oral   SpO2: 97%   Weight: 242 lb 6.4 oz (110 kg)   Height: '5\' 8"'  (1.727 m)     Results for orders placed or performed in visit on 07/29/18  Hemoglobin A1c  Result Value Ref Range   Hgb A1c MFr Bld 8.1 (H) 4.8 - 5.6 %   Est. average glucose Bld gHb Est-mCnc 186 mg/dL  Microalbumin / creatinine urine ratio  Result Value Ref Range   Creatinine, Urine 90.3 Not Estab. mg/dL   Microalbumin, Urine 30.8 Not Estab. ug/mL   Microalb/Creat Ratio 34 (H) 0 - 29 mg/g creat  Lipid panel  Result Value Ref Range   Cholesterol, Total 199 100 - 199 mg/dL   Triglycerides 143 0 - 149 mg/dL   HDL 72 >39 mg/dL   VLDL Cholesterol Cal 29 5 - 40 mg/dL   LDL Calculated 98 0 - 99 mg/dL   Chol/HDL Ratio 2.8 0.0 - 5.0 ratio  Comprehensive metabolic panel  Result Value Ref Range   Glucose 99 65 - 99 mg/dL   BUN 21 8 - 27 mg/dL   Creatinine, Ser 1.05 0.76 - 1.27 mg/dL   GFR calc non Af Amer 72 >59 mL/min/1.73   GFR calc Af Amer 83 >59 mL/min/1.73   BUN/Creatinine Ratio 20 10 - 24   Sodium 136 134 - 144 mmol/L   Potassium 4.8 3.5 - 5.2 mmol/L   Chloride 96 96 - 106 mmol/L   CO2 21 20 - 29 mmol/L   Calcium 9.6 8.6 - 10.2 mg/dL   Total Protein 6.9 6.0 - 8.5 g/dL   Albumin 4.6 3.8 - 4.8 g/dL   Globulin, Total 2.3 1.5 - 4.5 g/dL   Albumin/Globulin Ratio 2.0 1.2 - 2.2   Bilirubin Total 0.2 0.0 - 1.2 mg/dL   Alkaline Phosphatase 70 39 - 117 IU/L   AST 26 0 - 40 IU/L   ALT 26 0 - 44 IU/L       Assessment & Plan:    BLAYNE FRANKIE is a 68 y.o. male Type 2 diabetes mellitus with diabetic polyneuropathy, without long-term current use of insulin ([place]) - Plan: blood glucose meter kit and supplies, Hemoglobin A1c, Microalbumin / creatinine urine ratio, gabapentin (NEURONTIN) 300 MG capsule, glimepiride (AMARYL) 4 MG tablet, liraglutide (VICTOZA) 18 MG/3ML SOPN, metFORMIN (GLUCOPHAGE) 1000 MG tablet  -Uncontrolled with microalbuminuria.  Elevated A1c, but has  been off Victoza due to cost.  Now has restarted.  Handout given on diet, nutrition recommendations.  Can check with pharmacist to see if less costly GLP.  Will continue same doses of meds for now with repeat testing in 3 months  Chronic pain of both knees - Plan: traMADol (ULTRAM) 50 MG tablet  -Tolerating current regimen without side effects of tramadol.  Not ready for knee replacement surgery at this time.  Refilled same doses  Need for prophylactic measure - Plan: CANCELED: Pneumococcal polysaccharide vaccine 23-valent greater than or equal to 2yo subcutaneous/IM  Depression, unspecified depression type - Plan: sertraline (ZOLOFT) 100 MG tablet  -Stable on current dose of Zoloft.  Continue same  Essential hypertension - Plan: Comprehensive metabolic  panel, lisinopril (PRINIVIL,ZESTRIL) 20 MG tablet  -Stable, continue ACE inhibitor with diabetes.  No change in lisinopril dosing.    Hypercholesteremia - Plan: Lipid panel, Comprehensive metabolic panel, pravastatin (PRAVACHOL) 80 MG tablet  -  Stable, tolerating current regimen. Medications refilled.   Meds ordered this encounter  Medications  . traMADol (ULTRAM) 50 MG tablet    Sig: Take 1 tablet (50 mg total) by mouth every 6 (six) hours as needed.    Dispense:  120 tablet    Refill:  0  . blood glucose meter kit and supplies    Sig: 1 each by Other route as directed. Dispense based on patient and insurance preference. Check once per day (variable times).  Accu check.    Dispense:  1 each    Refill:  0    Order Specific Question:   Number of strips    Answer:   100    Order Specific Question:   Number of lancets    Answer:   100  . gabapentin (NEURONTIN) 300 MG capsule    Sig: Take 1-2 capsules (300-600 mg total) by mouth 4 (four) times daily as needed.    Dispense:  180 capsule    Refill:  3  . glimepiride (AMARYL) 4 MG tablet    Sig: Take 1 tablet (4 mg total) by mouth 2 (two) times daily.    Dispense:  180 tablet     Refill:  3  . liraglutide (VICTOZA) 18 MG/3ML SOPN    Sig: Inject 0.3 mLs (1.8 mg total) into the skin daily. I    Dispense:  3 mL    Refill:  5  . lisinopril (PRINIVIL,ZESTRIL) 20 MG tablet    Sig: Take 1 tablet (20 mg total) by mouth daily.    Dispense:  90 tablet    Refill:  3  . metFORMIN (GLUCOPHAGE) 1000 MG tablet    Sig: Take 1 tablet (1,000 mg total) by mouth 2 (two) times daily with a meal.    Dispense:  180 tablet    Refill:  3  . pravastatin (PRAVACHOL) 80 MG tablet    Sig: Take 1 tablet (80 mg total) by mouth daily.    Dispense:  90 tablet    Refill:  1  . sertraline (ZOLOFT) 100 MG tablet    Sig: Take 2 tablets (200 mg total) by mouth daily. Start with one per day for initial 2 weeks, then increase to 2 per day.    Dispense:  180 tablet    Refill:  1   Patient Instructions   No medication changes at this time.  If pharmacist able to find a less expensive option in the same class as Victoza, let me know.  Follow-up as planned for wellness exam and we can do memory screening at that time.   Tips for Eating Away From Home If You Have Diabetes Controlling your blood sugar (glucose) levels can be challenging when you do not prepare your own meals. The following tips can help you manage your diabetes when you eat away from home. If you have questions or if you need help, work with your health care provider or diet and nutrition specialist (dietitian). Planning ahead Plan ahead if you know you will be eating away from home:  Try to eat your meals and snacks at about the same time each day. If you know your meal is going to be later than normal, make sure you have a small snack. Being very hungry  can cause you to make unhealthy food choices.  Make a list of restaurants near you that offer healthy choices. If a restaurant has a carry-out menu, take the menu home and plan what you will order ahead of time.  Look up the restaurant you want to eat at online. Many chain and  fast-food restaurants list nutritional information online. Use this information to choose the healthiest options and to calculate how many carbohydrates will be in your meal.  Use a carbohydrate-counting book or mobile app to look up the carbohydrate content and serving size of the foods you want to eat. Free foods A "free food" is any food or drink that has less than 5 grams of carbohydrates and less than 20 calories per serving. These food are high in fiber and nutrients and low in calories, carbohydrates, and fats. Free foods include:  Non-starchy vegetables, such as carrots, broccoli, celery, lettuce, or green beans.  Non-sugar drinks, such as water, unsweetened coffee, or unsweetened tea.  Low-calorie salad dressings.  Sugar-free gelatin. Starting meals with a salad full of vegetables is a healthy choice that includes a lot of free foods. Avoid high-calorie salad toppings like bacon, cheese, and high-fat dressings. Ask for your salad dressing to be served on the side so that you dip your fork in the dressing and then in the salad. This allows you to control how much dressing you eat and still get the flavor with every bite. Choices to control carbohydrates   Ask your server to take away the bread basket or chips from your table.  Choose light yogurt or [place] yogurt instead of non-fat sweetened yogurt.  Order fresh fruit. A salad bar often offers fresh fruit choices. Avoid canned fruit because it is usually packed in sugar or syrup.  Order a salad, and ask for dressing on the side.  Ask for substitutes. For example, if your meal comes with french fries, ask for a side salad or steamed veggies instead. If a meal comes with fried chicken, ask for grilled chicken instead. Beverages  Choose drinks that are low in calories and sugar, such as: ? Water. ? Unsweetened tea or coffee. ? Lowfat milk.  Avoid the following drinks: ? Alcoholic beverages. ? Regular (not diet) sodas. Other  tips  If you take insulin, wait to take your insulin once your food arrives to your table. This will ensure that your insulin and your food are timed correctly.  Become familiar with serving sizes and learn to recognize how many servings are in a portion. Restaurant portions are typically two to three times larger than what you really need.  Ask your server for a to-go box at the beginning of the meal. When your food comes, leave the amount you should have on your plate, and put the rest in the to-go box so that you are not tempted to eat too much.  Consider splitting an entree with someone and ordering a side salad.  Avoid buffets. They are typically too tempting and result in overeating. Where to find more information  American Diabetes Association: www.diabetes.org  American Association of Diabetes Educators: www.diabeteseducator.org Summary  Plan ahead when eating away from home.  Try to eat your meals and snacks at about the same time each day. If you know your meal is going to be later than normal, make sure you have a small snack. Being very hungry can cause you to make unhealthy food choices.  Ask for substitutes. For example, if your meal comes with  french fries, ask for a side salad or steamed veggies instead. If a meal comes with fried chicken, ask for grilled chicken instead.  Ask for a to-go box when you order your meal. Divide your meal before you start eating. This information is not intended to replace advice given to you by your health care provider. Make sure you discuss any questions you have with your health care provider. Document Released: 06/18/2005 Document Revised: 09/26/2016 Document Reviewed: 09/26/2016 Elsevier Interactive Patient Education  2019 [place].   Diabetes Mellitus and Nutrition, Adult When you have diabetes (diabetes mellitus), it is very important to have healthy eating habits because your blood sugar (glucose) levels are greatly affected  by what you eat and drink. Eating healthy foods in the appropriate amounts, at about the same times every day, can help you:  Control your blood glucose.  Lower your risk of heart disease.  Improve your blood pressure.  Reach or maintain a healthy weight. Every person with diabetes is different, and each person has different needs for a meal plan. Your health care provider may recommend that you work with a diet and nutrition specialist (dietitian) to make a meal plan that is best for you. Your meal plan may vary depending on factors such as:  The calories you need.  The medicines you take.  Your weight.  Your blood glucose, blood pressure, and cholesterol levels.  Your activity level.  Other health conditions you have, such as heart or kidney disease. How do carbohydrates affect me? Carbohydrates, also called carbs, affect your blood glucose level more than any other type of food. Eating carbs naturally raises the amount of glucose in your blood. Carb counting is a method for keeping track of how many carbs you eat. Counting carbs is important to keep your blood glucose at a healthy level, especially if you use insulin or take certain oral diabetes medicines. It is important to know how many carbs you can safely have in each meal. This is different for every person. Your dietitian can help you calculate how many carbs you should have at each meal and for each snack. Foods that contain carbs include:  Bread, cereal, rice, pasta, and crackers.  Potatoes and corn.  Peas, beans, and lentils.  Milk and yogurt.  Fruit and juice.  Desserts, such as cakes, cookies, ice cream, and candy. How does alcohol affect me? Alcohol can cause a sudden decrease in blood glucose (hypoglycemia), especially if you use insulin or take certain oral diabetes medicines. Hypoglycemia can be a life-threatening condition. Symptoms of hypoglycemia (sleepiness, dizziness, and confusion) are similar to  symptoms of having too much alcohol. If your health care provider says that alcohol is safe for you, follow these guidelines:  Limit alcohol intake to no more than 1 drink per day for nonpregnant women and 2 drinks per day for men. One drink equals 12 oz of beer, 5 oz of wine, or 1 oz of hard liquor.  Do not drink on an empty stomach.  Keep yourself hydrated with water, diet soda, or unsweetened iced tea.  Keep in mind that regular soda, juice, and other mixers may contain a lot of sugar and must be counted as carbs. What are tips for following this plan?  Reading food labels  Start by checking the serving size on the "Nutrition Facts" label of packaged foods and drinks. The amount of calories, carbs, fats, and other nutrients listed on the label is based on one serving of the item.  Many items contain more than one serving per package.  Check the total grams (g) of carbs in one serving. You can calculate the number of servings of carbs in one serving by dividing the total carbs by 15. For example, if a food has 30 g of total carbs, it would be equal to 2 servings of carbs.  Check the number of grams (g) of saturated and trans fats in one serving. Choose foods that have low or no amount of these fats.  Check the number of milligrams (mg) of salt (sodium) in one serving. Most people should limit total sodium intake to less than 2,300 mg per day.  Always check the nutrition information of foods labeled as "low-fat" or "nonfat". These foods may be higher in added sugar or refined carbs and should be avoided.  Talk to your dietitian to identify your daily goals for nutrients listed on the label. Shopping  Avoid buying canned, premade, or processed foods. These foods tend to be high in fat, sodium, and added sugar.  Shop around the outside edge of the grocery store. This includes fresh fruits and vegetables, bulk grains, fresh meats, and fresh dairy. Cooking  Use low-heat cooking methods,  such as baking, instead of high-heat cooking methods like deep frying.  Cook using healthy oils, such as olive, canola, or sunflower oil.  Avoid cooking with butter, cream, or high-fat meats. Meal planning  Eat meals and snacks regularly, preferably at the same times every day. Avoid going long periods of time without eating.  Eat foods high in fiber, such as fresh fruits, vegetables, beans, and whole grains. Talk to your dietitian about how many servings of carbs you can eat at each meal.  Eat 4-6 ounces (oz) of lean protein each day, such as lean meat, chicken, fish, eggs, or tofu. One oz of lean protein is equal to: ? 1 oz of meat, chicken, or fish. ? 1 egg. ?  cup of tofu.  Eat some foods each day that contain healthy fats, such as avocado, nuts, seeds, and fish. Lifestyle  Check your blood glucose regularly.  Exercise regularly as told by your health care provider. This may include: ? 150 minutes of moderate-intensity or vigorous-intensity exercise each week. This could be brisk walking, biking, or water aerobics. ? Stretching and doing strength exercises, such as yoga or weightlifting, at least 2 times a week.  Take medicines as told by your health care provider.  Do not use any products that contain nicotine or tobacco, such as cigarettes and e-cigarettes. If you need help quitting, ask your health care provider.  Work with a Social worker or diabetes educator to identify strategies to manage stress and any emotional and social challenges. Questions to ask a health care provider  Do I need to meet with a diabetes educator?  Do I need to meet with a dietitian?  What number can I call if I have questions?  When are the best times to check my blood glucose? Where to find more information:  American Diabetes Association: diabetes.org  Academy of Nutrition and Dietetics: www.eatright.[employer] of Diabetes and Digestive and Kidney Diseases (NIH):  [employer] Summary  A healthy meal plan will help you control your blood glucose and maintain a healthy lifestyle.  Working with a diet and nutrition specialist (dietitian) can help you make a meal plan that is best for you.  Keep in mind that carbohydrates (carbs) and alcohol have immediate effects on your blood glucose levels. It is  important to count carbs and to use alcohol carefully. This information is not intended to replace advice given to you by your health care provider. Make sure you discuss any questions you have with your health care provider. Document Released: 03/15/2005 Document Revised: 01/16/2017 Document Reviewed: 07/23/2016 Elsevier Interactive Patient Education  [employer].    If you have lab work done today you will be contacted with your lab results within the next 2 weeks.  If you have not heard from [place] then please contact us. The fastest way to get your results is to register for My Chart.   IF you received an x-ray today, you will receive an invoice from [facility]. Please contact [facility] at [phone] with questions or concerns regarding your invoice.   IF you received labwork today, you will receive an invoice from [facility]. Please contact [facility] at [phone] with questions or concerns regarding your invoice.   Our billing staff will not be able to assist you with questions regarding bills from these companies.  You will be contacted with the lab results as soon as they are available. The fastest way to get your results is to activate your My Chart account. Instructions are located on the last page of this paperwork. If you have not heard from [place] regarding the results in 2 weeks, please contact this office.       Signed,   Merri Ray, MD [facility].  07/31/18 8:22 AM

## 2018-07-30 LAB — HEMOGLOBIN A1C
Est. average glucose Bld gHb Est-mCnc: 186 mg/dL
Hgb A1c MFr Bld: 8.1 % — ABNORMAL HIGH (ref 4.8–5.6)

## 2018-07-30 LAB — COMPREHENSIVE METABOLIC PANEL
ALT: 26 IU/L (ref 0–44)
AST: 26 IU/L (ref 0–40)
Albumin/Globulin Ratio: 2 (ref 1.2–2.2)
Albumin: 4.6 g/dL (ref 3.8–4.8)
Alkaline Phosphatase: 70 IU/L (ref 39–117)
BUN/Creatinine Ratio: 20 (ref 10–24)
BUN: 21 mg/dL (ref 8–27)
Bilirubin Total: 0.2 mg/dL (ref 0.0–1.2)
CHLORIDE: 96 mmol/L (ref 96–106)
CO2: 21 mmol/L (ref 20–29)
Calcium: 9.6 mg/dL (ref 8.6–10.2)
Creatinine, Ser: 1.05 mg/dL (ref 0.76–1.27)
GFR calc Af Amer: 83 mL/min/{1.73_m2} (ref >59)
GFR calc non Af Amer: 72 mL/min/{1.73_m2} (ref >59)
GLUCOSE: 99 mg/dL (ref 65–99)
Globulin, Total: 2.3 g/dL (ref 1.5–4.5)
Potassium: 4.8 mmol/L (ref 3.5–5.2)
Sodium: 136 mmol/L (ref 134–144)
Total Protein: 6.9 g/dL (ref 6.0–8.5)

## 2018-07-30 LAB — MICROALBUMIN / CREATININE URINE RATIO
Creatinine, Urine: 90.3 mg/dL
MICROALB/CREAT RATIO: 34 mg/g{creat} — AB (ref 0–29)
Microalbumin, Urine: 30.8 ug/mL

## 2018-07-30 LAB — LIPID PANEL
Chol/HDL Ratio: 2.8 ratio (ref 0.0–5.0)
Cholesterol, Total: 199 mg/dL (ref 100–199)
HDL: 72 mg/dL (ref >39)
LDL Calculated: 98 mg/dL (ref 0–99)
Triglycerides: 143 mg/dL (ref 0–149)
VLDL Cholesterol Cal: 29 mg/dL (ref 5–40)

## 2018-07-31 ENCOUNTER — Encounter: Payer: Self-pay | Admitting: Family Medicine

## 2018-08-07 ENCOUNTER — Telehealth (INDEPENDENT_AMBULATORY_CARE_PROVIDER_SITE_OTHER): Payer: Self-pay | Admitting: Orthopaedic Surgery

## 2018-08-07 ENCOUNTER — Other Ambulatory Visit (INDEPENDENT_AMBULATORY_CARE_PROVIDER_SITE_OTHER): Payer: Self-pay | Admitting: Orthopedic Surgery

## 2018-08-07 DIAGNOSIS — G5601 Carpal tunnel syndrome, right upper limb: Secondary | ICD-10-CM | POA: Diagnosis not present

## 2018-08-07 MED ORDER — HYDROCODONE-ACETAMINOPHEN 5-325 MG PO TABS: 1.0000 | ORAL_TABLET | ORAL | 0 refills | Status: DC | PRN

## 2018-08-07 NOTE — Telephone Encounter (Signed)
Called pharmacy to cancel hydrocodone and was told that medication had already picked up patient.

## 2018-08-07 NOTE — Telephone Encounter (Signed)
Joe, pharmacist at [facility] called stating he received the prescription of Hydrocodone.  He states patient was prescribed Tramadol (120 tablets) on 07/30/18 by Dr. Carlota Raspberry.  Pharmacist is requesting a return call to let him know that you have this information and it is okay to fill Hydrocodone prescription.  [phone]

## 2018-08-07 NOTE — Telephone Encounter (Signed)
Per Aaron Edelman, patient can take the Tramadol and discontinue the RX for hydrocodone. Please call pharmacy and patient. Thank you.

## 2018-08-11 ENCOUNTER — Ambulatory Visit (INDEPENDENT_AMBULATORY_CARE_PROVIDER_SITE_OTHER): Payer: Medicare Other | Admitting: Orthopaedic Surgery

## 2018-08-11 ENCOUNTER — Encounter (INDEPENDENT_AMBULATORY_CARE_PROVIDER_SITE_OTHER): Payer: Self-pay | Admitting: Orthopaedic Surgery

## 2018-08-11 DIAGNOSIS — Z48811 Encounter for surgical aftercare following surgery on the nervous system: Secondary | ICD-10-CM

## 2018-08-11 DIAGNOSIS — G5601 Carpal tunnel syndrome, right upper limb: Secondary | ICD-10-CM

## 2018-08-11 NOTE — Progress Notes (Signed)
Office Visit Note   Patient: Daniel [name]           Date of Birth: March 02, 1951           MRN: [id number] Visit Date: 08/11/2018              Requested by: Wendie Agreste, MD [address] [place], [place] [address] PCP: Wendie Agreste, MD   Assessment & Plan: Visit Diagnoses:  1. Carpal tunnel syndrome, right upper limb     Plan: 4 days status post right carpal tunnel release and doing well.  Did not have to wake up and shake his hand.  Does not have the pain that he had preoperatively.  Dressing was removed waterproof Band-Aid applied.  Neurologically intact.  Office 1 week.  Will wear splint  Follow-Up Instructions: Return in about 1 week (around 08/18/2018).   Orders:  No orders of the defined types were placed in this encounter.  No orders of the defined types were placed in this encounter.     Procedures: No procedures performed   Clinical Data: No additional findings.   Subjective: Chief Complaint  Patient presents with  . Right Wrist - Routine Post Op  Patient presents today four days out from surgery. Date of surgery 08/07/2018. He had a right carpal tunnel release.  HPI  Review of Systems   Objective: Vital Signs: There were no vitals taken for this visit.  Physical Exam  Ortho Exam dressing removed from right hand.  Carpal tunnel incision healing without problem.  Wound was cleaned with alcohol and waterproof Band-Aid applied.  Full range of motion of fingers.  Neurologically intact.  Able to oppose thumb to little finger.  Good capillary refill to fingers  Specialty Comments:  No specialty comments available.  Imaging: No results found.   PMFS History: Patient Active Problem List   Diagnosis Date Noted  . Carpal tunnel syndrome, right upper limb 02/18/2018  . Anxiety and depression 10/28/2016  . Gastroesophageal reflux disease without esophagitis 10/28/2016  . Seasonal allergic rhinitis due to pollen 10/28/2016  . Primary osteoarthritis  of left knee 07/25/2015  . Essential hypertension 07/25/2015  . Hypercholesteremia 07/25/2015  . Type 2 diabetes mellitus with diabetic polyneuropathy, without long-term current use of insulin ([place]) 07/25/2015   Past Medical History:  Diagnosis Date  . Allergic rhinitis   . Allergy   . Anxiety   . Arthritis   . Benign hypertension   . Cataract   . Depression   . Diabetes mellitus ([place])   . GERD (gastroesophageal reflux disease)   . History of pneumonia   . HLD (hyperlipidemia)   . Neuromuscular disorder ([place])     Family History  Problem Relation Age of Onset  . Diabetes Father   . Colon polyps Father   . Atrial fibrillation Mother   . Prostate cancer Paternal Uncle   . Cancer Sister   . Esophageal cancer Neg Hx   . Rectal cancer Neg Hx   . Stomach cancer Neg Hx     Past Surgical History:  Procedure Laterality Date  . CATARACT EXTRACTION     bilateral  . EYE SURGERY    . KNEE SURGERY     right   Social History   Occupational History  . Occupation: Dealer, Company secretary  Tobacco Use  . Smoking status: Never Smoker  . Smokeless tobacco: Never Used  Substance and Sexual Activity  . Alcohol use: No  . Drug use: No  .  Sexual activity: Not Currently

## 2018-08-14 ENCOUNTER — Inpatient Hospital Stay (INDEPENDENT_AMBULATORY_CARE_PROVIDER_SITE_OTHER): Payer: Medicare Other | Admitting: Orthopaedic Surgery

## 2018-08-18 ENCOUNTER — Encounter (INDEPENDENT_AMBULATORY_CARE_PROVIDER_SITE_OTHER): Payer: Self-pay | Admitting: Orthopaedic Surgery

## 2018-08-18 ENCOUNTER — Ambulatory Visit (INDEPENDENT_AMBULATORY_CARE_PROVIDER_SITE_OTHER): Payer: Medicare Other | Admitting: Orthopaedic Surgery

## 2018-08-18 DIAGNOSIS — G5601 Carpal tunnel syndrome, right upper limb: Secondary | ICD-10-CM

## 2018-08-18 NOTE — Progress Notes (Signed)
Office Visit Note   Patient: Daniel [name]           Date of Birth: 09-25-1950           MRN: [id number] Visit Date: 08/18/2018              Requested by: Wendie Agreste, MD [address] [place], [place] [address] PCP: Wendie Agreste, MD   Assessment & Plan: Visit Diagnoses:  1. Carpal tunnel syndrome, right upper limb     Plan: 11 days status post right carpal tunnel release and doing quite well.  Stitches removed and a new waterproof Band-Aid applied.  Will use volar wrist splint.  Work on range of motion exercises of his hand and return in 2 weeks  Follow-Up Instructions: Return in about 2 weeks (around 09/01/2018).   Orders:  No orders of the defined types were placed in this encounter.  No orders of the defined types were placed in this encounter.     Procedures: No procedures performed   Clinical Data: No additional findings.   Subjective: No chief complaint on file. 11 days status post right carpal tunnel release and doing well.  Fingers feel little bit "stiff" but notes that he did have "arthritis" preoperatively.  He is able to make a full fist and release.  Continues to experience improvement in the pain numbness and tingling in his right hand.  Has not had to wake up at night shake his hand as he did preoperatively  HPI  Review of Systems   Objective: Vital Signs: There were no vitals taken for this visit.  Physical Exam  Ortho Exam right carpal tunnel incision is been healing without problem.  I remove the stitches and applied a waterproof Band-Aid.  Neurologically intact.  Full fist and release. good capillary refill to fingers.  Specialty Comments:  No specialty comments available.  Imaging: No results found.   PMFS History: Patient Active Problem List   Diagnosis Date Noted  . Carpal tunnel syndrome, right upper limb 02/18/2018  . Anxiety and depression 10/28/2016  . Gastroesophageal reflux disease without esophagitis 10/28/2016  .  Seasonal allergic rhinitis due to pollen 10/28/2016  . Primary osteoarthritis of left knee 07/25/2015  . Essential hypertension 07/25/2015  . Hypercholesteremia 07/25/2015  . Type 2 diabetes mellitus with diabetic polyneuropathy, without long-term current use of insulin ([place]) 07/25/2015   Past Medical History:  Diagnosis Date  . Allergic rhinitis   . Allergy   . Anxiety   . Arthritis   . Benign hypertension   . Cataract   . Depression   . Diabetes mellitus ([place])   . GERD (gastroesophageal reflux disease)   . History of pneumonia   . HLD (hyperlipidemia)   . Neuromuscular disorder ([place])     Family History  Problem Relation Age of Onset  . Diabetes Father   . Colon polyps Father   . Atrial fibrillation Mother   . Prostate cancer Paternal Uncle   . Cancer Sister   . Esophageal cancer Neg Hx   . Rectal cancer Neg Hx   . Stomach cancer Neg Hx     Past Surgical History:  Procedure Laterality Date  . CATARACT EXTRACTION     bilateral  . EYE SURGERY    . KNEE SURGERY     right   Social History   Occupational History  . Occupation: Dealer, Company secretary  Tobacco Use  . Smoking status: Never Smoker  . Smokeless tobacco: Never Used  Substance and Sexual Activity  . Alcohol use: No  . Drug use: No  . Sexual activity: Not Currently     Garald Balding, MD   Note - This record has been created using [employer].  Chart creation errors have been sought, but may not always  have been located. Such creation errors do not reflect on  the standard of medical care.

## 2018-09-01 ENCOUNTER — Ambulatory Visit (INDEPENDENT_AMBULATORY_CARE_PROVIDER_SITE_OTHER): Payer: Medicare Other | Admitting: Orthopaedic Surgery

## 2018-09-01 ENCOUNTER — Encounter (INDEPENDENT_AMBULATORY_CARE_PROVIDER_SITE_OTHER): Payer: Self-pay

## 2018-09-01 ENCOUNTER — Encounter (INDEPENDENT_AMBULATORY_CARE_PROVIDER_SITE_OTHER): Payer: Self-pay | Admitting: Orthopaedic Surgery

## 2018-09-01 VITALS — BP 153/82 | HR 74 | Ht 68.0 in | Wt 235.0 lb

## 2018-09-01 DIAGNOSIS — G5601 Carpal tunnel syndrome, right upper limb: Secondary | ICD-10-CM

## 2018-09-01 NOTE — Progress Notes (Signed)
Office Visit Note   Patient: Daniel [name]           Date of Birth: 1951/04/26           MRN: [id number] Visit Date: 09/01/2018              Requested by: Wendie Agreste, MD [address] [place], [place] [address] PCP: Wendie Agreste, MD   Assessment & Plan: Visit Diagnoses:  1. Carpal tunnel syndrome, right upper limb     Plan: 3-1/2-week status post right carpal tunnel release and doing well.  No longer has the pain or trouble with dropping objects.  Not having the numbness and tingling.  Does have some stiffness in the morning consistent with his arthritis.  We will plan to reevaluate in 3 weeks and perform activities as tolerated.  At some point he like to consider having surgery on the left for the same problem  Follow-Up Instructions: Return in about 3 weeks (around 09/22/2018).   Orders:  No orders of the defined types were placed in this encounter.  No orders of the defined types were placed in this encounter.     Procedures: No procedures performed   Clinical Data: No additional findings.   Subjective: Chief Complaint  Patient presents with  . Right Wrist - Routine Post Op    Right carpal tunnel release 08/07/18  Patient presents today for a two week follow up. He had right carpal tunnel release on 08/07/18. He has been wearing a volar wrist splint most of the time and doing a home exercise program. Patient states that he is doing well. He takes tramadol and Ibuprofen for other body pains.  HPI  Review of Systems   Objective: Vital Signs: BP (!) 153/82   Pulse 74   Ht 5\' 8"  (1.727 m)   Wt 235 lb (106.6 kg)   BMI 35.73 kg/m   Physical Exam  Ortho Exam right hand incision healing without a problem.  Full range of motion of fingers able to oppose thumb to little finger without problem.  Normal sensibility and good capillary refill.  Does have some scarring around the incision with some induration but no significant pain redness or drainage  Specialty  Comments:  No specialty comments available.  Imaging: No results found.   PMFS History: Patient Active Problem List   Diagnosis Date Noted  . Carpal tunnel syndrome, right upper limb 02/18/2018  . Anxiety and depression 10/28/2016  . Gastroesophageal reflux disease without esophagitis 10/28/2016  . Seasonal allergic rhinitis due to pollen 10/28/2016  . Primary osteoarthritis of left knee 07/25/2015  . Essential hypertension 07/25/2015  . Hypercholesteremia 07/25/2015  . Type 2 diabetes mellitus with diabetic polyneuropathy, without long-term current use of insulin ([place]) 07/25/2015   Past Medical History:  Diagnosis Date  . Allergic rhinitis   . Allergy   . Anxiety   . Arthritis   . Benign hypertension   . Cataract   . Depression   . Diabetes mellitus ([place])   . GERD (gastroesophageal reflux disease)   . History of pneumonia   . HLD (hyperlipidemia)   . Neuromuscular disorder ([place])     Family History  Problem Relation Age of Onset  . Diabetes Father   . Colon polyps Father   . Atrial fibrillation Mother   . Prostate cancer Paternal Uncle   . Cancer Sister   . Esophageal cancer Neg Hx   . Rectal cancer Neg Hx   . Stomach cancer  Neg Hx     Past Surgical History:  Procedure Laterality Date  . CATARACT EXTRACTION     bilateral  . EYE SURGERY    . KNEE SURGERY     right   Social History   Occupational History  . Occupation: Dealer, Company secretary  Tobacco Use  . Smoking status: Never Smoker  . Smokeless tobacco: Never Used  Substance and Sexual Activity  . Alcohol use: No  . Drug use: No  . Sexual activity: Not Currently

## 2018-09-03 ENCOUNTER — Ambulatory Visit (INDEPENDENT_AMBULATORY_CARE_PROVIDER_SITE_OTHER): Payer: Medicare Other | Admitting: Orthopaedic Surgery

## 2018-09-22 ENCOUNTER — Encounter (INDEPENDENT_AMBULATORY_CARE_PROVIDER_SITE_OTHER): Payer: Self-pay | Admitting: Orthopaedic Surgery

## 2018-09-22 ENCOUNTER — Other Ambulatory Visit: Payer: Self-pay

## 2018-09-22 ENCOUNTER — Ambulatory Visit (INDEPENDENT_AMBULATORY_CARE_PROVIDER_SITE_OTHER): Payer: Medicare Other | Admitting: Orthopaedic Surgery

## 2018-09-22 VITALS — BP 155/84 | HR 65 | Resp 16 | Ht 68.0 in | Wt 235.0 lb

## 2018-09-22 DIAGNOSIS — G5601 Carpal tunnel syndrome, right upper limb: Secondary | ICD-10-CM

## 2018-09-22 DIAGNOSIS — Z9889 Other specified postprocedural states: Secondary | ICD-10-CM

## 2018-09-22 NOTE — Progress Notes (Signed)
Office Visit Note   Patient: Daniel [name]           Date of Birth: 08-09-1950           MRN: [id number] Visit Date: 09/22/2018              Requested by: Wendie Agreste, MD [address] [place], [place] [address] PCP: Wendie Agreste, MD   Assessment & Plan: Visit Diagnoses:  1. Carpal tunnel syndrome, right upper limb     Plan: Approximately 6 weeks status post right carpal tunnel release and doing very well.  No longer has the numbness tingling or pain that he had preoperatively.  Still has some painless induration about the incision just suggested he massage that with med derma or vitamin E.  We will plan to see back as needed.  Follow-Up Instructions: Return if symptoms worsen or fail to improve.   Orders:  No orders of the defined types were placed in this encounter.  No orders of the defined types were placed in this encounter.     Procedures: No procedures performed   Clinical Data: No additional findings.   Subjective: Chief Complaint  Patient presents with  . Right Wrist - Routine Post Op  . Routine Post Op    Doing good   Daniel [name] is a 68 year old male who returns for a routine post op for right carpal tunnel release. He is doing well.  HPI  Review of Systems   Objective: Vital Signs: BP (!) 155/84 (BP Location: Right Arm, Patient Position: Sitting, Cuff Size: Normal)   Pulse 65   Resp 16   Ht 5\' 8"  (1.727 m)   Wt 235 lb (106.6 kg)   BMI 35.73 kg/m   Physical Exam  Ortho Exam right hand with full range of motion of the digits.  Good capillary refill.  Neurologically intact.  Good opposition of thumb to little finger.  Carpal tunnel incision has healed nicely but was still with some induration that is not painful Specialty Comments:  No specialty comments available.  Imaging: No results found.   PMFS History: Patient Active Problem List   Diagnosis Date Noted  . Carpal tunnel syndrome, right upper limb 02/18/2018  . Anxiety and  depression 10/28/2016  . Gastroesophageal reflux disease without esophagitis 10/28/2016  . Seasonal allergic rhinitis due to pollen 10/28/2016  . Primary osteoarthritis of left knee 07/25/2015  . Essential hypertension 07/25/2015  . Hypercholesteremia 07/25/2015  . Type 2 diabetes mellitus with diabetic polyneuropathy, without long-term current use of insulin ([place]) 07/25/2015   Past Medical History:  Diagnosis Date  . Allergic rhinitis   . Allergy   . Anxiety   . Arthritis   . Benign hypertension   . Cataract   . Depression   . Diabetes mellitus ([place])   . GERD (gastroesophageal reflux disease)   . History of pneumonia   . HLD (hyperlipidemia)   . Neuromuscular disorder ([place])     Family History  Problem Relation Age of Onset  . Diabetes Father   . Colon polyps Father   . Atrial fibrillation Mother   . Prostate cancer Paternal Uncle   . Cancer Sister   . Esophageal cancer Neg Hx   . Rectal cancer Neg Hx   . Stomach cancer Neg Hx     Past Surgical History:  Procedure Laterality Date  . CATARACT EXTRACTION     bilateral  . EYE SURGERY    . KNEE SURGERY  right   Social History   Occupational History  . Occupation: Dealer, Company secretary  Tobacco Use  . Smoking status: Never Smoker  . Smokeless tobacco: Never Used  Substance and Sexual Activity  . Alcohol use: No  . Drug use: No  . Sexual activity: Not Currently

## 2018-09-29 ENCOUNTER — Telehealth: Payer: Self-pay | Admitting: Family Medicine

## 2018-09-29 DIAGNOSIS — M25562 Pain in left knee: Principal | ICD-10-CM

## 2018-09-29 DIAGNOSIS — G8929 Other chronic pain: Secondary | ICD-10-CM

## 2018-09-29 DIAGNOSIS — M25561 Pain in right knee: Principal | ICD-10-CM

## 2018-09-29 NOTE — Telephone Encounter (Signed)
Copied from [place] [id number]. Topic: Quick Communication - Rx Refill/Question >> Sep 29, 2018  3:46 PM Dimple Nanas, RN wrote: Medication:     traMADol (ULTRAM) 50 MG tablet  Has the patient contacted their pharmacy? No. (Agent: If no, request that the patient contact the pharmacy for the refill.) (Agent: If yes, when and what did the pharmacy advise?)  Preferred Pharmacy (with phone number or street name):   Agent: Please be advised that RX refills may take up to 3 business days. We ask that you follow-up with your pharmacy.

## 2018-10-01 NOTE — Telephone Encounter (Signed)
Please advise 

## 2018-10-02 MED ORDER — TRAMADOL HCL 50 MG PO TABS: 50.0000 mg | ORAL_TABLET | Freq: Four times a day (QID) | ORAL | 0 refills | Status: DC | PRN

## 2018-10-02 NOTE — Telephone Encounter (Signed)
Tramadol last filled 07/30/2018.  Refill ordered.Controlled substance database (PDMP) reviewed. No concerns appreciated.

## 2018-10-02 NOTE — Addendum Note (Signed)
Addended by: Merri Ray R on: 10/02/2018 10:19 AM   Modules accepted: Orders

## 2018-10-14 ENCOUNTER — Other Ambulatory Visit: Payer: Self-pay

## 2018-10-14 DIAGNOSIS — E1142 Type 2 diabetes mellitus with diabetic polyneuropathy: Secondary | ICD-10-CM

## 2018-10-20 ENCOUNTER — Telehealth (INDEPENDENT_AMBULATORY_CARE_PROVIDER_SITE_OTHER): Payer: Medicare Other | Admitting: Family Medicine

## 2018-10-20 ENCOUNTER — Other Ambulatory Visit: Payer: Self-pay

## 2018-10-20 DIAGNOSIS — M25562 Pain in left knee: Secondary | ICD-10-CM

## 2018-10-20 DIAGNOSIS — G8929 Other chronic pain: Secondary | ICD-10-CM | POA: Diagnosis not present

## 2018-10-20 DIAGNOSIS — M25561 Pain in right knee: Secondary | ICD-10-CM

## 2018-10-20 DIAGNOSIS — E1142 Type 2 diabetes mellitus with diabetic polyneuropathy: Secondary | ICD-10-CM

## 2018-10-20 DIAGNOSIS — E7800 Pure hypercholesterolemia, unspecified: Secondary | ICD-10-CM

## 2018-10-20 MED ORDER — LIRAGLUTIDE 18 MG/3ML ~~LOC~~ SOPN: 1.8000 mg | PEN_INJECTOR | Freq: Every day | SUBCUTANEOUS | 5 refills | Status: DC

## 2018-10-20 MED ORDER — PRAVASTATIN SODIUM 80 MG PO TABS: 80.0000 mg | ORAL_TABLET | Freq: Every day | ORAL | 1 refills | Status: DC

## 2018-10-20 NOTE — Progress Notes (Signed)
Virtual Visit via Telephone Note  I connected with Daniel [name] on 10/20/18 at 8:15 AM by telephone and verified that I am speaking with the correct person using two identifiers.   I discussed the limitations, risks, security and privacy concerns of performing an evaluation and management service by telephone and the availability of in person appointments. I also discussed with the patient that there may be a patient responsible charge related to this service. The patient expressed understanding and agreed to proceed, consent obtained  Chief complaint:diabetes follow up.  History of Present Illness:  Diabetes: Last evaluated in January.  Uncontrolled at that time with microalbuminuria.  A1c elevated but had been off Victoza due to cost but had recently restarted.  Continued on 1.8 mg daily.  Additionally continued on glimepiride 4 mg twice daily, metformin 1000 mg twice daily, gabapentin 300 mg 1-2 4 times daily as needed.  He is on a statin as well as ACE inhibitor.  Now on victoza QD, on glimepiride, metformin as rx. Few gabapentin per day.   Home avg 111 past 30 days, 137 prior 30 days. 116 this am.  Riding recumbent bike.   Microalbumin: Ratio of 34 on January 28 Optho, foot exam, pneumovax:  Wt Readings from Last 3 Encounters:  09/22/18 235 lb (106.6 kg)  09/01/18 235 lb (106.6 kg)  07/29/18 242 lb 6.4 oz (110 kg)   Wt 234.8 at home.   Lab Results  Component Value Date   HGBA1C 8.1 (H) 07/29/2018   HGBA1C 7.8 (A) 02/11/2018   HGBA1C 9.2 (H) 11/14/2017   Lab Results  Component Value Date   MICROALBUR 15.1 05/03/2015   [place] 98 07/29/2018   CREATININE 1.05 07/29/2018   Chronic knee pain: Knee replacement surgery recommended previously, but not ready to undergo that procedure.  Has been tolerating tramadol for pain control.  Last prescription for #120 of tramadol 50 mg on April 2.  Previous on January 28.  on 1-2 per day.  Has tried lidocaine and blue emu at times.     Patient Active Problem List   Diagnosis Date Noted  . Carpal tunnel syndrome, right upper limb 02/18/2018  . Anxiety and depression 10/28/2016  . Gastroesophageal reflux disease without esophagitis 10/28/2016  . Seasonal allergic rhinitis due to pollen 10/28/2016  . Primary osteoarthritis of left knee 07/25/2015  . Essential hypertension 07/25/2015  . Hypercholesteremia 07/25/2015  . Type 2 diabetes mellitus with diabetic polyneuropathy, without long-term current use of insulin ([place]) 07/25/2015   Past Medical History:  Diagnosis Date  . Allergic rhinitis   . Allergy   . Anxiety   . Arthritis   . Benign hypertension   . Cataract   . Depression   . Diabetes mellitus ([place])   . GERD (gastroesophageal reflux disease)   . History of pneumonia   . HLD (hyperlipidemia)   . Neuromuscular disorder [facility])    Past Surgical History:  Procedure Laterality Date  . CATARACT EXTRACTION     bilateral  . EYE SURGERY    . KNEE SURGERY     right   Allergies  Allergen Reactions  . Actos [Pioglitazone] Other (See Comments), Shortness Of Breath and Swelling    Blood sugar went up and down and sweating profusely  . Codeine Nausea Only and Palpitations    Palpitations, SOB   Prior to Admission medications   Medication Sig Start Date End Date Taking? Authorizing Provider  albuterol (PROVENTIL HFA;VENTOLIN HFA) 108 (90 Base) MCG/ACT inhaler Inhale  2 puffs into the lungs every 4 (four) hours as needed for wheezing or shortness of breath. Reported on 10/19/2015 10/19/15   Shawnee Knapp, MD  aspirin 81 MG tablet Take 81 mg by mouth daily.    [provider]  blood glucose meter kit and supplies 1 each by Other route as directed. Dispense based on patient and insurance preference. Check once per day (variable times).  Accu check. 07/29/18   Wendie Agreste, MD  budesonide-formoterol [facility]) 80-4.5 MCG/ACT inhaler Inhale 2 puffs into the lungs 2 (two) times daily. 09/13/16   Wardell Honour, MD  cetirizine (ZYRTEC) 10 MG tablet Take 10 mg by mouth daily.    [provider]  Cyanocobalamin (VITAMIN B 12 PO) Take 1,000 mg by mouth daily.    [provider]  gabapentin (NEURONTIN) 300 MG capsule Take 1-2 capsules (300-600 mg total) by mouth 4 (four) times daily as needed. 07/29/18   Wendie Agreste, MD  glimepiride (AMARYL) 4 MG tablet Take 1 tablet (4 mg total) by mouth 2 (two) times daily. 07/29/18   Wendie Agreste, MD  glucose blood (ONE TOUCH ULTRA TEST) test strip Test Twice a day as directed 10/27/14   [provider]  glucose blood test strip check sugars once daily. Dx: E11.42 11/14/17   Wendie Agreste, MD  ibuprofen (ADVIL,MOTRIN) 200 MG tablet Take 800 mg by mouth every morning.    [provider]  Lancets (ONETOUCH DELICA PLUS HWEXHB71I) [place] USE 1 TO CHECK GLUCOSE ONCE DAILY 08/04/18   [provider]  liraglutide (VICTOZA) 18 MG/3ML SOPN Inject 0.3 mLs (1.8 mg total) into the skin daily. I 07/29/18   Wendie Agreste, MD  lisinopril (PRINIVIL,ZESTRIL) 20 MG tablet Take 1 tablet (20 mg total) by mouth daily. 07/29/18   Wendie Agreste, MD  metFORMIN (GLUCOPHAGE) 1000 MG tablet Take 1 tablet (1,000 mg total) by mouth 2 (two) times daily with a meal. 07/29/18   Wendie Agreste, MD  montelukast (SINGULAIR) 10 MG tablet Take 1 tablet (10 mg total) by mouth at bedtime. 11/14/17   Wendie Agreste, MD  omeprazole (PRILOSEC) 20 MG capsule Take 1 capsule (20 mg total) by mouth daily. 11/14/17   Wendie Agreste, MD  pravastatin (PRAVACHOL) 80 MG tablet Take 1 tablet (80 mg total) by mouth daily. 07/29/18   Wendie Agreste, MD  sertraline (ZOLOFT) 100 MG tablet Take 2 tablets (200 mg total) by mouth daily. Start with one per day for initial 2 weeks, then increase to 2 per day. 07/29/18   Wendie Agreste, MD  traMADol (ULTRAM) 50 MG tablet Take 1 tablet (50 mg total) by mouth every 6 (six) hours as needed. 10/02/18   Wendie Agreste,  MD   Social History   Socioeconomic History  . Marital status: Married    Spouse name: Not on file  . Number of children: 3  . Years of education: Not on file  . Highest education level: Not on file  Occupational History  . Occupation: Dealer, Animal nutritionist Needs  . Financial resource strain: Not on file  . Food insecurity:    Worry: Not on file    Inability: Not on file  . Transportation needs:    Medical: Not on file    Non-medical: Not on file  Tobacco Use  . Smoking status: Never Smoker  . Smokeless tobacco: Never Used  Substance and Sexual Activity  . Alcohol use:  No  . Drug use: No  . Sexual activity: Not Currently  Lifestyle  . Physical activity:    Days per week: Not on file    Minutes per session: Not on file  . Stress: Not on file  Relationships  . Social connections:    Talks on phone: Not on file    Gets together: Not on file    Attends religious service: Not on file    Active member of club or organization: Not on file    Attends meetings of clubs or organizations: Not on file    Relationship status: Not on file  . Intimate partner violence:    Fear of current or ex partner: Not on file    Emotionally abused: Not on file    Physically abused: Not on file    Forced sexual activity: Not on file  Other Topics Concern  . Not on file  Social History Narrative  . Not on file     Observations/Objective: Normal speech on phone, no distress  Assessment and Plan: Chronic pain of both knees  -Stable with current regimen, has gabapentin as well as tramadol.  Denies any side effects or increased use.  Type 2 diabetes mellitus with diabetic polyneuropathy, without long-term current use of insulin (HCC)  -Improved control based on home readings. Due to  COVID 19 pandemic, decided against in office lab testing at this time.  As things improve can return for lab testing but office visit with me in 3 months.  No changes for now.  Hypercholesteremia   -Continue pravastatin same dose.    Follow Up Instructions:  3 months.    I discussed the assessment and treatment plan with the patient. The patient was provided an opportunity to ask questions and all were answered. The patient agreed with the plan and demonstrated an understanding of the instructions.   The patient was advised to call back or seek an in-person evaluation if the symptoms worsen or if the condition fails to improve as anticipated.  I provided 8 minutes of non-face-to-face time during this encounter.  Signed,   Merri Ray, MD Primary Care at [place].  10/20/18

## 2018-10-20 NOTE — Patient Instructions (Addendum)
  Keep up the good work with diabetes.  Follow-up in 3 months, but when there is less of a concern with coronavirus, please return for lab only visit.  No change in medication for now.  Let me know if there are questions.  Take care.    If you have lab work done today you will be contacted with your lab results within the next 2 weeks.  If you have not heard from [place] then please contact us. The fastest way to get your results is to register for My Chart.   IF you received an x-ray today, you will receive an invoice from [facility]. Please contact [facility] at [phone] with questions or concerns regarding your invoice.   IF you received labwork today, you will receive an invoice from [place]. Please contact [facility] at [phone] with questions or concerns regarding your invoice.   Our billing staff will not be able to assist you with questions regarding bills from these companies.  You will be contacted with the lab results as soon as they are available. The fastest way to get your results is to activate your My Chart account. Instructions are located on the last page of this paperwork. If you have not heard from [place] regarding the results in 2 weeks, please contact this office.

## 2018-10-20 NOTE — Progress Notes (Signed)
CC- Diabetes- Blood this am was 116 the average has been 111.1. Patient not having any issus at this time. Patient would like to hold off for a few weeks before coming into the office to have labs done.

## 2018-11-02 ENCOUNTER — Other Ambulatory Visit: Payer: Self-pay | Admitting: Family Medicine

## 2018-11-07 ENCOUNTER — Other Ambulatory Visit: Payer: Self-pay | Admitting: Family Medicine

## 2018-11-25 ENCOUNTER — Telehealth: Payer: Self-pay | Admitting: Family Medicine

## 2018-11-25 ENCOUNTER — Other Ambulatory Visit: Payer: Self-pay | Admitting: Family Medicine

## 2018-11-25 ENCOUNTER — Other Ambulatory Visit: Payer: Self-pay

## 2018-11-25 DIAGNOSIS — E1142 Type 2 diabetes mellitus with diabetic polyneuropathy: Secondary | ICD-10-CM

## 2018-11-25 DIAGNOSIS — R062 Wheezing: Secondary | ICD-10-CM

## 2018-11-25 DIAGNOSIS — G8929 Other chronic pain: Secondary | ICD-10-CM

## 2018-11-25 MED ORDER — MONTELUKAST SODIUM 10 MG PO TABS: 10.0000 mg | ORAL_TABLET | Freq: Every day | ORAL | 0 refills | Status: DC

## 2018-11-25 MED ORDER — GLUCOSE BLOOD VI STRP: ORAL_STRIP | 3 refills | Status: DC

## 2018-11-25 NOTE — Telephone Encounter (Signed)
Requested medication (s) are due for refill today: yes  Requested medication (s) are on the active medication list: yes  Last refill:  10/02/18 #120  Future visit scheduled: yes  Notes to clinic:  Unable to refill per protocol.     Requested Prescriptions  Pending Prescriptions Disp Refills   traMADol (ULTRAM) 50 MG tablet [Pharmacy Med Name: traMADol HCl 50 MG Oral Tablet] 120 tablet 0    Sig: TAKE 1 TABLET BY MOUTH EVERY 6 HOURS AS NEEDED     Not Delegated - Analgesics:  Opioid Agonists Failed - 11/25/2018  4:45 PM      Failed - This refill cannot be delegated      Failed - Urine Drug Screen completed in last 360 days.      Passed - Valid encounter within last 6 months    Recent Outpatient Visits          3 months ago Type 2 diabetes mellitus with diabetic polyneuropathy, without long-term current use of insulin ([place])   [facility] at [facility], Ranell Patrick, MD   9 months ago Type 2 diabetes mellitus with diabetic neuropathy, without long-term current use of insulin [facility])   [facility] at [place] [facility], Ranell Patrick, MD   10 months ago Type 2 diabetes mellitus with hyperglycemia, without long-term current use of insulin [facility])   [facility] at [place] [facility], Ranell Patrick, MD   1 year ago Type 2 diabetes mellitus with diabetic polyneuropathy, without long-term current use of insulin [facility])   [facility] at [place] [facility], Ranell Patrick, MD   1 year ago Type 2 diabetes mellitus with diabetic polyneuropathy, without long-term current use of insulin [facility])   Primary Care at [facility], Renette Butters, MD      Future Appointments            In 1 month Carlota Raspberry Ranell Patrick, MD Primary [facility] at [place], [facility]         Signed Prescriptions Disp Refills   [place] test strip 100 each 1    Sig: USE 1 STRIP TO [place] DAILY     Endocrinology: Diabetes - Testing Supplies Passed - 11/25/2018  4:45 PM      Passed - Valid encounter within last 12 months    Recent Outpatient Visits          3 months ago Type 2 diabetes mellitus with diabetic polyneuropathy, without long-term current use of insulin ([place])   [facility], Ranell Patrick, MD   9 months ago Type 2 diabetes mellitus with diabetic neuropathy, without long-term current use of insulin [facility])   [facility], Ranell Patrick, MD   10 months ago Type 2 diabetes mellitus with hyperglycemia, without long-term current use of insulin [facility])   [facility] at [place] [facility], Ranell Patrick, MD   1 year ago Type 2 diabetes mellitus with diabetic polyneuropathy, without long-term current use of insulin [facility])   [facility] at [place] [facility], Ranell Patrick, MD   1 year ago Type 2 diabetes mellitus with diabetic polyneuropathy, without long-term current use of insulin [facility])   Primary Care at [facility], Renette Butters, MD      Future Appointments            In 1 month Carlota Raspberry Ranell Patrick, MD Primary Care at [place], [facility]         Refused Prescriptions Disp Refills   montelukast (SINGULAIR) 10 MG tablet [Pharmacy Med Name: Montelukast  Sodium 10 MG Oral Tablet] 90 tablet 0    Sig: TAKE 1 TABLET BY MOUTH AT BEDTIME     Pulmonology:  Leukotriene Inhibitors Passed - 11/25/2018  4:45 PM      Passed - Valid encounter within last 12 months    Recent Outpatient Visits          3 months ago Type 2 diabetes mellitus with diabetic polyneuropathy, without long-term current use of insulin ([place])   [facility], Ranell Patrick, MD   9 months ago Type 2 diabetes mellitus with diabetic neuropathy, without long-term current use of insulin [facility])   [facility] at [place], Ranell Patrick, MD   10 months ago Type 2 diabetes mellitus with hyperglycemia, without long-term current use of insulin [facility])   [facility] at [place], Ranell Patrick, MD   1 year ago Type 2 diabetes mellitus with diabetic polyneuropathy, without long-term current use of insulin [facility])   [facility] at [place] [facility], Ranell Patrick, MD   1 year ago Type 2  diabetes mellitus with diabetic polyneuropathy, without long-term current use of insulin [facility])   Primary Care at [facility], Renette Butters, MD      Future Appointments            In 1 month Carlota Raspberry Ranell Patrick, MD Primary Care at [place], [facility]

## 2018-11-25 NOTE — Telephone Encounter (Signed)
Copied from [place] [id number]. Topic: Quick Communication - Rx Refill/Question >> Nov 25, 2018  4:52 PM Erick Blinks wrote: Medication: glucose blood test strip [[id number]] + montelukast (SINGULAIR) 10 MG tablet -pt requesting call back. Please advise [phone]   Has the patient contacted their pharmacy? Yes (Agent: If no, request that the patient contact the pharmacy for the refill.) (Agent: If yes, when and what did the pharmacy advise?)  Preferred Pharmacy (with phone number or street name): [place], [place] - [address] [place] [address] Phone: [phone] Fax: [phone]    Agent: Please be advised that RX refills may take up to 3 business days. We ask that you follow-up with your pharmacy.

## 2018-11-25 NOTE — Telephone Encounter (Signed)
Sent in Rx. Called pt and he was made aware.

## 2018-11-27 NOTE — Telephone Encounter (Signed)
Controlled substance database (PDMP) reviewed. No concerns appreciated.  Last filled for #120 on April 2, office visit to discuss medication by telemedicine occurred April 20.  Refill placed.

## 2018-12-22 ENCOUNTER — Other Ambulatory Visit: Payer: Self-pay

## 2018-12-22 NOTE — Patient Outreach (Signed)
[place] [facility]) Care Management  12/22/2018  Daniel [name] 1950-10-22 [id number]   Medication Adherence call to [name] [facility] Identifiers Verify spoke with patient he is past due on Pravastatin 80 mg patient explain he is still taking 1 tablet daily and at the beginning of the year he received duplicate and had enough for 6 month patient has medication at this time and will order when due.Daniel [name] is showing past due under [place].  [place] Management Direct Dial [phone]  Fax [phone] [name]

## 2019-01-05 ENCOUNTER — Other Ambulatory Visit: Payer: Self-pay | Admitting: Family Medicine

## 2019-01-05 DIAGNOSIS — G8929 Other chronic pain: Secondary | ICD-10-CM

## 2019-01-05 DIAGNOSIS — M25561 Pain in right knee: Secondary | ICD-10-CM

## 2019-01-05 NOTE — Telephone Encounter (Signed)
Requested medication (s) are due for refill today: yes  Requested medication (s) are on the active medication list: yes  Last refill:  11/27/2018  Future visit scheduled: yes  Notes to clinic: not delegated    Requested Prescriptions  Pending Prescriptions Disp Refills   traMADol (ULTRAM) 50 MG tablet [Pharmacy Med Name: traMADol HCl 50 MG Oral Tablet] 120 tablet 0    Sig: TAKE 1 TABLET BY MOUTH EVERY 6 HOURS AS NEEDED     Not Delegated - Analgesics:  Opioid Agonists Failed - 01/05/2019 11:03 AM      Failed - This refill cannot be delegated      Failed - Urine Drug Screen completed in last 360 days.      Passed - Valid encounter within last 6 months    Recent Outpatient Visits          5 months ago Type 2 diabetes mellitus with diabetic polyneuropathy, without long-term current use of insulin ([place])   [facility] at [facility], Ranell Patrick, MD   10 months ago Type 2 diabetes mellitus with diabetic neuropathy, without long-term current use of insulin [facility] [address])   [facility] at [place], Ranell Patrick, MD   1 year ago Type 2 diabetes mellitus with hyperglycemia, without long-term current use of insulin [facility])   [facility] at [place], Ranell Patrick, MD   1 year ago Type 2 diabetes mellitus with diabetic polyneuropathy, without long-term current use of insulin [facility])   [facility] at [place], Ranell Patrick, MD   1 year ago Type 2 diabetes mellitus with diabetic polyneuropathy, without long-term current use of insulin [facility])   Primary Care at [facility], Renette Butters, MD      Future Appointments            In 1 week Carlota Raspberry Ranell Patrick, MD Primary Care at [place], [facility]

## 2019-01-08 NOTE — Telephone Encounter (Signed)
On 1-2 per day when discussed in April.  Has upcoming appointment. Controlled substance database (PDMP) reviewed. No concerns appreciated.  Last filled for #120 on May 28.  Refilled and will discuss medication further at upcoming appointment

## 2019-01-16 ENCOUNTER — Ambulatory Visit: Payer: Medicare Other | Admitting: Family Medicine

## 2019-01-21 ENCOUNTER — Ambulatory Visit: Payer: Medicare Other | Admitting: Family Medicine

## 2019-02-11 ENCOUNTER — Other Ambulatory Visit: Payer: Self-pay

## 2019-02-11 ENCOUNTER — Encounter: Payer: Self-pay | Admitting: Family Medicine

## 2019-02-11 ENCOUNTER — Ambulatory Visit (INDEPENDENT_AMBULATORY_CARE_PROVIDER_SITE_OTHER): Payer: Medicare Other | Admitting: Family Medicine

## 2019-02-11 VITALS — BP 140/74 | HR 72 | Temp 98.4°F | Resp 14 | Wt 238.8 lb

## 2019-02-11 DIAGNOSIS — I10 Essential (primary) hypertension: Secondary | ICD-10-CM | POA: Diagnosis not present

## 2019-02-11 DIAGNOSIS — Z23 Encounter for immunization: Secondary | ICD-10-CM

## 2019-02-11 DIAGNOSIS — E1142 Type 2 diabetes mellitus with diabetic polyneuropathy: Secondary | ICD-10-CM

## 2019-02-11 DIAGNOSIS — E7800 Pure hypercholesterolemia, unspecified: Secondary | ICD-10-CM | POA: Diagnosis not present

## 2019-02-11 DIAGNOSIS — F329 Major depressive disorder, single episode, unspecified: Secondary | ICD-10-CM

## 2019-02-11 DIAGNOSIS — F32A Depression, unspecified: Secondary | ICD-10-CM

## 2019-02-11 MED ORDER — GABAPENTIN 300 MG PO CAPS: 300.0000 mg | ORAL_CAPSULE | Freq: Two times a day (BID) | ORAL | 3 refills | Status: DC | PRN

## 2019-02-11 MED ORDER — PRAVASTATIN SODIUM 80 MG PO TABS: 80.0000 mg | ORAL_TABLET | Freq: Every day | ORAL | 1 refills | Status: DC

## 2019-02-11 MED ORDER — SERTRALINE HCL 100 MG PO TABS: 200.0000 mg | ORAL_TABLET | Freq: Every day | ORAL | 1 refills | Status: DC

## 2019-02-11 NOTE — Patient Instructions (Addendum)
   Check with your pharmacist of other meds in same class of Victoza may be less expensive. If that med is too costly, let me know and we can look at other option - potentially insulin until benefits improve next year.   If you have lab work done today you will be contacted with your lab results within the next 2 weeks.  If you have not heard from [place] then please contact us. The fastest way to get your results is to register for My Chart.   IF you received an x-ray today, you will receive an invoice from [facility]. Please contact [facility] Radiology at [phone] with questions or concerns regarding your invoice.   IF you received labwork today, you will receive an invoice from [place]. Please contact [facility] at [phone] with questions or concerns regarding your invoice.   Our billing staff will not be able to assist you with questions regarding bills from these companies.  You will be contacted with the lab results as soon as they are available. The fastest way to get your results is to activate your My Chart account. Instructions are located on the last page of this paperwork. If you have not heard from [place] regarding the results in 2 weeks, please contact this office.

## 2019-02-11 NOTE — Progress Notes (Signed)
Subjective:    Patient ID: Daniel [name], male    DOB: 1950-08-10, 68 y.o.   MRN: [id number]  HPI Daniel [name] is a 68 y.o. male Presents today for: Chief Complaint  Patient presents with  . Diabetes    3 month f/u for diabetes, patient need refills on pending meds. Patient would like a lower dose of the gabapentin. Reached a donut whole on the Victoza may need a cheaper rx. Foot exam was normal but patient did states he do get cramps in feet   Diabetes: Complicated by polyneuropathy and hyperglycemia, microalbuminuria last evaluated with lab work in January. Telemedicine April 20.  Was taking 1.8 mg/day of Victoza, Amaryl 4 mg twice daily, metformin 1000 mg twice daily, gabapentin 300 mg few times per day as needed.  Reported improved home readings at that time.  Some possible cost issues with Victoza.  Donut hole.  Microalbumin: Slightly elevated ratio of 34 in January Optho, foot exam, pneumovax: Due for Pneumovax - today. Flu vaccine today as well - high dose.  Ophthalmology exam November 2019 Past few weeks - off victoza past 10 days. [phone]). Plans to contineu for now.  Home readings: Fasting: 160-190 off victoza, 110-135 on victoza.  Gabapentin - 1 in am and 1 at night. Doing well for pain in feet.   Chronic knee/shoulder issues - has been using hemp cream with turmeric, arnica, and MSM that has been helping.  Tramadol 2-3 per day usually, rarely 4.  Last about 45 days for #120. Not ready for knee surgery yet. Plans on considering after wife retires early next year. 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  No new side effects with meds.   Wt Readings from Last 3 Encounters:  02/11/19 238 lb 12.8 oz (108.3 kg)  09/22/18 235 lb (106.6 kg)  09/01/18 235 lb (106.6 kg)    Diabetic Foot Exam - Simple   Simple Foot Form Diabetic Foot exam was performed with the following findings: Yes 02/11/2019  9:18 AM  Visual Inspection No deformities, no ulcerations,  no other skin breakdown bilaterally: Yes Sensation Testing Intact to touch and monofilament testing bilaterally: Yes Pulse Check Posterior Tibialis and Dorsalis pulse intact bilaterally: Yes Comments      Lab Results  Component Value Date   HGBA1C 8.1 (H) 07/29/2018   HGBA1C 7.8 (A) 02/11/2018   HGBA1C 9.2 (H) 11/14/2017   Lab Results  Component Value Date   MICROALBUR 15.1 05/03/2015   LDLCALC 98 07/29/2018   CREATININE 1.05 07/29/2018   Depression:  Depression screen [facility] 2/9 02/11/2019 10/20/2018 07/29/2018 02/11/2018 12/31/2017  Decreased Interest 0 0 0 0 0  Down, Depressed, Hopeless 0 0 0 0 0  PHQ - 2 Score 0 0 0 0 0  doing well on zoloft 236m qd.    Patient Active Problem List   Diagnosis Date Noted  . Carpal tunnel syndrome, right upper limb 02/18/2018  . Anxiety and depression 10/28/2016  . Gastroesophageal reflux disease without esophagitis 10/28/2016  . Seasonal allergic rhinitis due to pollen 10/28/2016  . Primary osteoarthritis of left knee 07/25/2015  . Essential hypertension 07/25/2015  . Hypercholesteremia 07/25/2015  . Type 2 diabetes mellitus with diabetic polyneuropathy, without long-term current use of insulin ([place] 07/25/2015   Past Medical History:  Diagnosis Date  . Allergic rhinitis   . Allergy   . Anxiety   . Arthritis   . Benign hypertension   . Cataract   . Depression   . Diabetes  mellitus ([place])   . GERD (gastroesophageal reflux disease)   . History of pneumonia   . HLD (hyperlipidemia)   . Neuromuscular disorder [facility])    Past Surgical History:  Procedure Laterality Date  . CATARACT EXTRACTION     bilateral  . EYE SURGERY    . KNEE SURGERY     right   Allergies  Allergen Reactions  . Actos [Pioglitazone] Other (See Comments), Shortness Of Breath and Swelling    Blood sugar went up and down and sweating profusely  . Codeine Nausea Only and Palpitations    Palpitations, SOB   Prior to Admission medications   Medication Sig  Start Date End Date Taking? Authorizing Provider  albuterol (PROVENTIL HFA;VENTOLIN HFA) 108 (90 Base) MCG/ACT inhaler Inhale 2 puffs into the lungs every 4 (four) hours as needed for wheezing or shortness of breath. Reported on 10/19/2015 10/19/15  Yes Shawnee Knapp, MD  aspirin 81 MG tablet Take 81 mg by mouth daily.   Yes [provider]  blood glucose meter kit and supplies 1 each by Other route as directed. Dispense based on patient and insurance preference. Check once per day (variable times).  Accu check. 07/29/18  Yes Wendie Agreste, MD  budesonide-formoterol [facility]) 80-4.5 MCG/ACT inhaler Inhale 2 puffs into the lungs 2 (two) times daily. 09/13/16  Yes Wardell Honour, MD  cetirizine (ZYRTEC) 10 MG tablet Take 10 mg by mouth daily.   Yes [provider]  Cyanocobalamin (VITAMIN B 12 PO) Take 1,000 mg by mouth daily.   Yes [provider]  gabapentin (NEURONTIN) 300 MG capsule Take 1-2 capsules (300-600 mg total) by mouth 4 (four) times daily as needed. 07/29/18  Yes Wendie Agreste, MD  glimepiride (AMARYL) 4 MG tablet Take 1 tablet (4 mg total) by mouth 2 (two) times daily. 07/29/18  Yes Wendie Agreste, MD  glucose blood (ONE TOUCH ULTRA TEST) test strip Test Twice a day as directed 10/27/14  Yes [provider]  glucose blood test strip check sugars once daily. Dx: E11.42 11/25/18  Yes Wendie Agreste, MD  ibuprofen (ADVIL,MOTRIN) 200 MG tablet Take 800 mg by mouth every morning.   Yes [provider]  Lancets (ONETOUCH DELICA PLUS [id number]) [place] USE 1 TO CHECK GLUCOSE ONCE DAILY 08/04/18  Yes [provider]  liraglutide (VICTOZA) 18 MG/3ML SOPN Inject 0.3 mLs (1.8 mg total) into the skin daily. I 10/20/18  Yes Wendie Agreste, MD  lisinopril (PRINIVIL,ZESTRIL) 20 MG tablet Take 1 tablet (20 mg total) by mouth daily. 07/29/18  Yes Wendie Agreste, MD  metFORMIN (GLUCOPHAGE) 1000 MG tablet Take 1 tablet (1,000 mg total) by mouth 2  (two) times daily with a meal. 07/29/18  Yes Wendie Agreste, MD  montelukast (SINGULAIR) 10 MG tablet Take 1 tablet (10 mg total) by mouth at bedtime. 11/25/18  Yes Wendie Agreste, MD  omeprazole (PRILOSEC) 20 MG capsule Take 1 capsule (20 mg total) by mouth daily. 11/14/17  Yes Wendie Agreste, MD  [facility] ULTRA test strip USE 1 STRIP TO CHECK GLUCOSE ONCE DAILY 11/25/18  Yes Wendie Agreste, MD  pravastatin (PRAVACHOL) 80 MG tablet Take 1 tablet (80 mg total) by mouth daily. 10/20/18  Yes Wendie Agreste, MD  sertraline (ZOLOFT) 100 MG tablet Take 2 tablets (200 mg total) by mouth daily. Start with one per day for initial 2 weeks, then increase to 2 per day. 07/29/18  Yes Wendie Agreste, MD  traMADol (ULTRAM) 50 MG tablet TAKE 1 TABLET BY MOUTH EVERY 6 HOURS AS NEEDED 01/08/19  Yes Wendie Agreste, MD   Social History   Socioeconomic History  . Marital status: Married    Spouse name: Not on file  . Number of children: 3  . Years of education: Not on file  . Highest education level: Not on file  Occupational History  . Occupation: Dealer, Animal nutritionist Needs  . Financial resource strain: Not on file  . Food insecurity    Worry: Not on file    Inability: Not on file  . Transportation needs    Medical: Not on file    Non-medical: Not on file  Tobacco Use  . Smoking status: Never Smoker  . Smokeless tobacco: Never Used  Substance and Sexual Activity  . Alcohol use: No  . Drug use: No  . Sexual activity: Not Currently  Lifestyle  . Physical activity    Days per week: Not on file    Minutes per session: Not on file  . Stress: Not on file  Relationships  . Social Herbalist on phone: Not on file    Gets together: Not on file    Attends religious service: Not on file    Active member of club or organization: Not on file    Attends meetings of clubs or organizations: Not on file    Relationship status: Not on file  . Intimate partner violence     Fear of current or ex partner: Not on file    Emotionally abused: Not on file    Physically abused: Not on file    Forced sexual activity: Not on file  Other Topics Concern  . Not on file  Social History Narrative  . Not on file    Review of Systems  Constitutional: Negative for fatigue and unexpected weight change.  Eyes: Negative for visual disturbance.  Respiratory: Negative for cough, chest tightness and shortness of breath.   Cardiovascular: Negative for chest pain, palpitations and leg swelling.  Gastrointestinal: Negative for abdominal pain and blood in stool.  Neurological: Negative for dizziness, light-headedness and headaches.       Objective:   Physical Exam Vitals signs reviewed.  Constitutional:      Appearance: He is well-developed.  HENT:     Head: Normocephalic and atraumatic.  Eyes:     Pupils: Pupils are equal, round, and reactive to light.  Neck:     Vascular: No carotid bruit or JVD.  Cardiovascular:     Rate and Rhythm: Normal rate and regular rhythm.     Heart sounds: Normal heart sounds. No murmur.  Pulmonary:     Effort: Pulmonary effort is normal.     Breath sounds: Normal breath sounds. No rales.  Skin:    General: Skin is warm and dry.  Neurological:     Mental Status: He is alert and oriented to person, place, and time.    Vitals:   02/11/19 0912  BP: 140/74  Pulse: 72  Resp: 14  Temp: 98.4 F (36.9 C)  TempSrc: Oral  SpO2: 97%  Weight: 238 lb 12.8 oz (108.3 kg)         Assessment & Plan:   Daniel [name] is a 68 y.o. male Type 2 diabetes mellitus with diabetic polyneuropathy, without long-term current use of insulin ([place]) - Plan: HM Diabetes Foot Exam, gabapentin (NEURONTIN) 300 MG capsule, Hemoglobin A1c, CANCELED: Hemoglobin A1c  -  Prior decreased control, anticipate some decrease in control now as well with recent temporary discontinuation of Victoza.  He did ultimately state that he would like to remain on that medication,  even with cost, but recommend he discuss with his pharmacist possible less expensive options.  May need to switch to insulin temporarily if more cost effective.  Continue glimepiride, metformin same dose for now  -Continue gabapentin same dose, adjusted instructions.  Essential hypertension - Plan: Comprehensive metabolic panel  -Borderline systolic but overall stable.  No changes for now  Hypercholesteremia - Plan: Lipid Panel, pravastatin (PRAVACHOL) 80 MG tablet  -Tolerating statin at current dose, labs pending.  Continue same  Need for prophylactic vaccination against Streptococcus pneumoniae (pneumococcus) - Plan: Pneumococcal polysaccharide vaccine 23-valent greater than or equal to 2yo subcutaneous/IM given  Need for prophylactic vaccination and inoculation against influenza - Plan: CANCELED: Flu vaccine HIGH DOSE PF (Fluzone High dose)  -Plan for high-dose flu vaccine when available.  Depression, unspecified depression type - Plan: sertraline (ZOLOFT) 100 MG tablet  -stable, continue same dosing of zoloft at 257m qd.   Meds ordered this encounter  Medications  . gabapentin (NEURONTIN) 300 MG capsule    Sig: Take 1-2 capsules (300-600 mg total) by mouth 2 (two) times daily as needed.    Dispense:  180 capsule    Refill:  3  . sertraline (ZOLOFT) 100 MG tablet    Sig: Take 2 tablets (200 mg total) by mouth daily. Start with one per day for initial 2 weeks, then increase to 2 per day.    Dispense:  180 tablet    Refill:  1  . pravastatin (PRAVACHOL) 80 MG tablet    Sig: Take 1 tablet (80 mg total) by mouth daily.    Dispense:  90 tablet    Refill:  1   Patient Instructions     Check with your pharmacist of other meds in same class of Victoza may be less expensive. If that med is too costly, let me know and we can look at other option - potentially insulin until benefits improve next year.   If you have lab work done today you will be contacted with your lab results within  the next 2 weeks.  If you have not heard from [place] please contact [place] The fastest way to get your results is to register for My Chart.   IF you received an x-ray today, you will receive an invoice from [facility]. Please contact [facility] at [phone] questions or concerns regarding your invoice.   IF you received labwork today, you will receive an invoice from [place] Please contact [facility] at [phone] questions or concerns regarding your invoice.   Our billing staff will not be able to assist you with questions regarding bills from these companies.  You will be contacted with the lab results as soon as they are available. The fastest way to get your results is to activate your My Chart account. Instructions are located on the last page of this paperwork. If you have not heard from [place] the results in 2 weeks, please contact this office.        Signed,   JMerri Ray MD [facility]  02/11/19 8:48 PM

## 2019-02-12 LAB — LIPID PANEL
Chol/HDL Ratio: 3.2 ratio (ref 0.0–5.0)
Cholesterol, Total: 187 mg/dL (ref 100–199)
HDL: 58 mg/dL (ref >39)
LDL Calculated: 99 mg/dL (ref 0–99)
Triglycerides: 152 mg/dL — ABNORMAL HIGH (ref 0–149)
VLDL Cholesterol Cal: 30 mg/dL (ref 5–40)

## 2019-02-12 LAB — COMPREHENSIVE METABOLIC PANEL
ALT: 24 IU/L (ref 0–44)
AST: 18 IU/L (ref 0–40)
Albumin/Globulin Ratio: 1.8 (ref 1.2–2.2)
Albumin: 4.3 g/dL (ref 3.8–4.8)
Alkaline Phosphatase: 73 IU/L (ref 39–117)
BUN/Creatinine Ratio: 18 (ref 10–24)
BUN: 20 mg/dL (ref 8–27)
Bilirubin Total: 0.2 mg/dL (ref 0.0–1.2)
CO2: 20 mmol/L (ref 20–29)
Calcium: 9.7 mg/dL (ref 8.6–10.2)
Chloride: 103 mmol/L (ref 96–106)
Creatinine, Ser: 1.1 mg/dL (ref 0.76–1.27)
GFR calc Af Amer: 78 mL/min/{1.73_m2} (ref >59)
GFR calc non Af Amer: 68 mL/min/{1.73_m2} (ref >59)
Globulin, Total: 2.4 g/dL (ref 1.5–4.5)
Glucose: 166 mg/dL — ABNORMAL HIGH (ref 65–99)
Potassium: 4.7 mmol/L (ref 3.5–5.2)
Sodium: 140 mmol/L (ref 134–144)
Total Protein: 6.7 g/dL (ref 6.0–8.5)

## 2019-02-12 LAB — HEMOGLOBIN A1C
Est. average glucose Bld gHb Est-mCnc: 183 mg/dL
Hgb A1c MFr Bld: 8 % — ABNORMAL HIGH (ref 4.8–5.6)

## 2019-02-18 ENCOUNTER — Other Ambulatory Visit: Payer: Self-pay | Admitting: Emergency Medicine

## 2019-02-18 DIAGNOSIS — Z23 Encounter for immunization: Secondary | ICD-10-CM

## 2019-02-26 ENCOUNTER — Other Ambulatory Visit: Payer: Self-pay | Admitting: Family Medicine

## 2019-02-26 DIAGNOSIS — G8929 Other chronic pain: Secondary | ICD-10-CM

## 2019-02-26 NOTE — Telephone Encounter (Signed)
Requested medication (s) are due for refill today: yes  Requested medication (s) are on the active medication list: yes Last refill:  01/08/2019  Future visit scheduled: yes  Notes to clinic:  This refill cannot be delegated  Requested Prescriptions  Pending Prescriptions Disp Refills   traMADol (ULTRAM) 50 MG tablet [Pharmacy Med Name: traMADol HCl 50 MG Oral Tablet] 120 tablet 0    Sig: TAKE 1 TABLET BY MOUTH EVERY 6 HOURS AS NEEDED     Not Delegated - Analgesics:  Opioid Agonists Failed - 02/26/2019  9:05 AM      Failed - This refill cannot be delegated      Failed - Urine Drug Screen completed in last 360 days.      Passed - Valid encounter within last 6 months    Recent Outpatient Visits          2 weeks ago Type 2 diabetes mellitus with diabetic polyneuropathy, without long-term current use of insulin ([place])   [facility], Ranell Patrick, MD   7 months ago Type 2 diabetes mellitus with diabetic polyneuropathy, without long-term current use of insulin [facility])   [facility], Ranell Patrick, MD   1 year ago Type 2 diabetes mellitus with diabetic neuropathy, without long-term current use of insulin [facility])   [facility], Ranell Patrick, MD   1 year ago Type 2 diabetes mellitus with hyperglycemia, without long-term current use of insulin [facility])   [facility], Ranell Patrick, MD   1 year ago Type 2 diabetes mellitus with diabetic polyneuropathy, without long-term current use of insulin [facility])   [facility], Ranell Patrick, MD      Future Appointments            In 2 months Carlota Raspberry Ranell Patrick, MD [facility] [place], [facility]

## 2019-03-04 ENCOUNTER — Ambulatory Visit (INDEPENDENT_AMBULATORY_CARE_PROVIDER_SITE_OTHER): Payer: Medicare Other | Admitting: Family Medicine

## 2019-03-04 ENCOUNTER — Other Ambulatory Visit (INDEPENDENT_AMBULATORY_CARE_PROVIDER_SITE_OTHER): Payer: Medicare Other | Admitting: Emergency Medicine

## 2019-03-04 VITALS — BP 140/74 | Ht 68.0 in | Wt 234.0 lb

## 2019-03-04 DIAGNOSIS — Z23 Encounter for immunization: Secondary | ICD-10-CM

## 2019-03-04 DIAGNOSIS — Z0000 Encounter for general adult medical examination without abnormal findings: Secondary | ICD-10-CM | POA: Diagnosis not present

## 2019-03-04 NOTE — Patient Instructions (Signed)
Thank you for taking time to come for your Medicare Wellness Visit. I appreciate your ongoing commitment to your health goals. Please review the following plan we discussed and let me know if I can assist you in the future.  Leroy Kennedy LPN  Preventive Care 64 Years and Older, Male Preventive care refers to lifestyle choices and visits with your health care provider that can promote health and wellness. This includes:  A yearly physical exam. This is also called an annual well check.  Regular dental and eye exams.  Immunizations.  Screening for certain conditions.  Healthy lifestyle choices, such as diet and exercise. What can I expect for my preventive care visit? Physical exam Your health care provider will check:  Height and weight. These may be used to calculate body mass index (BMI), which is a measurement that tells if you are at a healthy weight.  Heart rate and blood pressure.  Your skin for abnormal spots. Counseling Your health care provider may ask you questions about:  Alcohol, tobacco, and drug use.  Emotional well-being.  Home and relationship well-being.  Sexual activity.  Eating habits.  History of falls.  Memory and ability to understand (cognition).  Work and work Statistician. What immunizations do I need?  Influenza (flu) vaccine  This is recommended every year. Tetanus, diphtheria, and pertussis (Tdap) vaccine  You may need a Td booster every 10 years. Varicella (chickenpox) vaccine  You may need this vaccine if you have not already been vaccinated. Zoster (shingles) vaccine  You may need this after age 66. Pneumococcal conjugate (PCV13) vaccine  One dose is recommended after age 84. Pneumococcal polysaccharide (PPSV23) vaccine  One dose is recommended after age 88. Measles, mumps, and rubella (MMR) vaccine  You may need at least one dose of MMR if you were born in 1957 or later. You may also need a second dose. Meningococcal  conjugate (MenACWY) vaccine  You may need this if you have certain conditions. Hepatitis A vaccine  You may need this if you have certain conditions or if you travel or work in places where you may be exposed to hepatitis A. Hepatitis B vaccine  You may need this if you have certain conditions or if you travel or work in places where you may be exposed to hepatitis B. Haemophilus influenzae type b (Hib) vaccine  You may need this if you have certain conditions. You may receive vaccines as individual doses or as more than one vaccine together in one shot (combination vaccines). Talk with your health care provider about the risks and benefits of combination vaccines. What tests do I need? Blood tests  Lipid and cholesterol levels. These may be checked every 5 years, or more frequently depending on your overall health.  Hepatitis C test.  Hepatitis B test. Screening  Lung cancer screening. You may have this screening every year starting at age 24 if you have a 30-pack-year history of smoking and currently smoke or have quit within the past 15 years.  Colorectal cancer screening. All adults should have this screening starting at age 52 and continuing until age 61. Your health care provider may recommend screening at age 57 if you are at increased risk. You will have tests every 1-10 years, depending on your results and the type of screening test.  Prostate cancer screening. Recommendations will vary depending on your family history and other risks.  Diabetes screening. This is done by checking your blood sugar (glucose) after you have not eaten for  a while (fasting). You may have this done every 1-3 years.  Abdominal aortic aneurysm (AAA) screening. You may need this if you are a current or former smoker.  Sexually transmitted disease (STD) testing. Follow these instructions at home: Eating and drinking  Eat a diet that includes fresh fruits and vegetables, whole grains, lean  protein, and low-fat dairy products. Limit your intake of foods with high amounts of sugar, saturated fats, and salt.  Take vitamin and mineral supplements as recommended by your health care provider.  Do not drink alcohol if your health care provider tells you not to drink.  If you drink alcohol: ? Limit how much you have to 0-2 drinks a day. ? Be aware of how much alcohol is in your drink. In the [place] one drink equals one 12 oz bottle of beer (355 mL), one 5 oz glass of wine (148 mL), or one 1 oz glass of hard liquor (44 mL). Lifestyle  Take daily care of your teeth and gums.  Stay active. Exercise for at least 30 minutes on 5 or more days each week.  Do not use any products that contain nicotine or tobacco, such as cigarettes, e-cigarettes, and chewing tobacco. If you need help quitting, ask your health care provider.  If you are sexually active, practice safe sex. Use a condom or other form of protection to prevent STIs (sexually transmitted infections).  Talk with your health care provider about taking a low-dose aspirin or statin. What's next?  Visit your health care provider once a year for a well check visit.  Ask your health care provider how often you should have your eyes and teeth checked.  Stay up to date on all vaccines. This information is not intended to replace advice given to you by your health care provider. Make sure you discuss any questions you have with your health care provider. Document Released: 07/15/2015 Document Revised: 06/12/2018 Document Reviewed: 06/12/2018 Elsevier Patient Education  2020 [employer].

## 2019-03-04 NOTE — Progress Notes (Signed)
Presents today for [employer] Visit   Date of last exam: 02/11/2019  Interpreter used for this visit? no  I connected with  Daniel [name] on 03/04/19 by a telephone  application and verified that I am speaking with the correct person using two identifiers.   .  Patient Care Team: Wendie Agreste, MD as PCP - General (Family Medicine)   Other items to address today:   Discussed Eye/Dental Discussed immunizations Follow up scheduled 05/13/2019    Other Screening: Last screening for diabetes: 02/11/2019 Last lipid screening: 02/11/2019  ADVANCE DIRECTIVES: Discussed: yes On File: no copy requested Materials Provided: no  Immunization status:  Immunization History  Administered Date(s) Administered  . Fluad Quad(high Dose 65+) 02/11/2019  . Influenza, High Dose Seasonal PF 07/14/2018, 07/14/2018  . Influenza,inj,Quad PF,6+ Mos 05/03/2015, 02/28/2016, 04/08/2017  . Pneumococcal Conjugate-13 09/13/2016  . Pneumococcal Polysaccharide-23 07/19/2010, 02/11/2019  . Tdap 03/14/2006, 12/31/2017     There are no preventive care reminders to display for this patient.   Functional Status Survey: Is the patient deaf or have difficulty hearing?: No Does the patient have difficulty seeing, even when wearing glasses/contacts?: No Does the patient have difficulty concentrating, remembering, or making decisions?: No Does the patient have difficulty walking or climbing stairs?: No Does the patient have difficulty dressing or bathing?: No Does the patient have difficulty doing errands alone such as visiting a doctor's office or shopping?: No   6CIT Screen 03/04/2019  What Year? 0 points  What month? 0 points  What time? 0 points  Count back from 20 0 points  Months in reverse 0 points  Repeat phrase 0 points  Total Score 0        Clinical Support from 03/04/2019 in [facility]  AUDIT-C Score  0       Home Environment:   Live in two home    No trouble climbing stairs No scattered No grabs Adequate lighting/ no clutter   Patient Active Problem List   Diagnosis Date Noted  . Carpal tunnel syndrome, right upper limb 02/18/2018  . Anxiety and depression 10/28/2016  . Gastroesophageal reflux disease without esophagitis 10/28/2016  . Seasonal allergic rhinitis due to pollen 10/28/2016  . Primary osteoarthritis of left knee 07/25/2015  . Essential hypertension 07/25/2015  . Hypercholesteremia 07/25/2015  . Type 2 diabetes mellitus with diabetic polyneuropathy, without long-term current use of insulin ([place]) 07/25/2015     Past Medical History:  Diagnosis Date  . Allergic rhinitis   . Allergy   . Anxiety   . Arthritis   . Benign hypertension   . Cataract   . Depression   . Diabetes mellitus ([place])   . GERD (gastroesophageal reflux disease)   . History of pneumonia   . HLD (hyperlipidemia)   . Neuromuscular disorder [facility])      Past Surgical History:  Procedure Laterality Date  . CATARACT EXTRACTION     bilateral  . EYE SURGERY    . KNEE SURGERY     right     Family History  Problem Relation Age of Onset  . Diabetes Father   . Colon polyps Father   . Atrial fibrillation Mother   . Prostate cancer Paternal Uncle   . Cancer Sister   . Esophageal cancer Neg Hx   . Rectal cancer Neg Hx   . Stomach cancer Neg Hx      Social History   Socioeconomic History  . Marital status:  Married    Spouse name: Not on file  . Number of children: 3  . Years of education: Not on file  . Highest education level: Not on file  Occupational History  . Occupation: Dealer, Animal nutritionist Needs  . Financial resource strain: Not on file  . Food insecurity    Worry: Not on file    Inability: Not on file  . Transportation needs    Medical: Not on file    Non-medical: Not on file  Tobacco Use  . Smoking status: Never Smoker  . Smokeless tobacco: Never Used  Substance and Sexual Activity  . Alcohol use:  No  . Drug use: No  . Sexual activity: Not Currently  Lifestyle  . Physical activity    Days per week: Not on file    Minutes per session: Not on file  . Stress: Not on file  Relationships  . Social Herbalist on phone: Not on file    Gets together: Not on file    Attends religious service: Not on file    Active member of club or organization: Not on file    Attends meetings of clubs or organizations: Not on file    Relationship status: Not on file  . Intimate partner violence    Fear of current or ex partner: Not on file    Emotionally abused: Not on file    Physically abused: Not on file    Forced sexual activity: Not on file  Other Topics Concern  . Not on file  Social History Narrative  . Not on file     Allergies  Allergen Reactions  . Actos [Pioglitazone] Other (See Comments), Shortness Of Breath and Swelling    Blood sugar went up and down and sweating profusely  . Codeine Nausea Only and Palpitations    Palpitations, SOB     Prior to Admission medications   Medication Sig Start Date End Date Taking? Authorizing Provider  albuterol (PROVENTIL HFA;VENTOLIN HFA) 108 (90 Base) MCG/ACT inhaler Inhale 2 puffs into the lungs every 4 (four) hours as needed for wheezing or shortness of breath. Reported on 10/19/2015 10/19/15  Yes Shawnee Knapp, MD  aspirin 81 MG tablet Take 81 mg by mouth daily.   Yes [provider]  blood glucose meter kit and supplies 1 each by Other route as directed. Dispense based on patient and insurance preference. Check once per day (variable times).  Accu check. 07/29/18  Yes Wendie Agreste, MD  budesonide-formoterol [facility]) 80-4.5 MCG/ACT inhaler Inhale 2 puffs into the lungs 2 (two) times daily. 09/13/16  Yes Wardell Honour, MD  cetirizine (ZYRTEC) 10 MG tablet Take 10 mg by mouth daily.   Yes [provider]  Cyanocobalamin (VITAMIN B 12 PO) Take 1,000 mg by mouth daily.   Yes [provider]  gabapentin  (NEURONTIN) 300 MG capsule Take 1-2 capsules (300-600 mg total) by mouth 2 (two) times daily as needed. 02/11/19  Yes Wendie Agreste, MD  glimepiride (AMARYL) 4 MG tablet Take 1 tablet (4 mg total) by mouth 2 (two) times daily. 07/29/18  Yes Wendie Agreste, MD  glucose blood (ONE TOUCH ULTRA TEST) test strip Test Twice a day as directed 10/27/14  Yes [provider]  glucose blood test strip check sugars once daily. Dx: E11.42 11/25/18  Yes Wendie Agreste, MD  ibuprofen (ADVIL,MOTRIN) 200 MG tablet Take 800 mg by mouth every morning.   Yes  [provider]  Lancets (ONETOUCH DELICA PLUS [id number]) [place] USE 1 TO CHECK GLUCOSE ONCE DAILY 08/04/18  Yes [provider]  liraglutide (VICTOZA) 18 MG/3ML SOPN Inject 0.3 mLs (1.8 mg total) into the skin daily. I 10/20/18  Yes Wendie Agreste, MD  lisinopril (PRINIVIL,ZESTRIL) 20 MG tablet Take 1 tablet (20 mg total) by mouth daily. 07/29/18  Yes Wendie Agreste, MD  metFORMIN (GLUCOPHAGE) 1000 MG tablet Take 1 tablet (1,000 mg total) by mouth 2 (two) times daily with a meal. 07/29/18  Yes Wendie Agreste, MD  montelukast (SINGULAIR) 10 MG tablet Take 1 tablet (10 mg total) by mouth at bedtime. 11/25/18  Yes Wendie Agreste, MD  omeprazole (PRILOSEC) 20 MG capsule Take 1 capsule (20 mg total) by mouth daily. 11/14/17  Yes Wendie Agreste, MD  [facility] ULTRA test strip USE 1 STRIP TO CHECK GLUCOSE ONCE DAILY 11/25/18  Yes Wendie Agreste, MD  pravastatin (PRAVACHOL) 80 MG tablet Take 1 tablet (80 mg total) by mouth daily. 02/11/19  Yes Wendie Agreste, MD  sertraline (ZOLOFT) 100 MG tablet Take 2 tablets (200 mg total) by mouth daily. Start with one per day for initial 2 weeks, then increase to 2 per day. 02/11/19  Yes Wendie Agreste, MD  traMADol (ULTRAM) 50 MG tablet TAKE 1 TABLET BY MOUTH EVERY 6 HOURS AS NEEDED 02/27/19  Yes Wendie Agreste, MD     Depression screen [facility] 2/9 03/04/2019 02/11/2019 10/20/2018 07/29/2018  02/11/2018  Decreased Interest 0 0 0 0 0  Down, Depressed, Hopeless 0 0 0 0 0  PHQ - 2 Score 0 0 0 0 0     Fall Risk  03/04/2019 02/11/2019 10/20/2018 07/29/2018 02/11/2018  Falls in the past year? 0 0 0 1 No  Number falls in past yr: 0 - 0 0 -  Injury with Fall? 0 1 0 1 -  Comment - - - rt shoulder -  Follow up Falls evaluation completed;Education provided;Falls prevention discussed Falls evaluation completed - Falls evaluation completed -      PHYSICAL EXAM: BP 140/74 Comment: taken from previous visit  Ht _0  (1.727 m)   Wt 234 lb (106.1 kg) Comment: per patient  BMI 35.58 kg/m    Wt Readings from Last 3 Encounters:  03/04/19 234 lb (106.1 kg)  02/11/19 238 lb 12.8 oz (108.3 kg)  09/22/18 235 lb (106.6 kg)     Medicare annual wellness visit, subsequent    Physical Exam   Education/Counseling provided regarding diet and exercise, prevention of chronic diseases, smoking/tobacco cessation, if applicable, and reviewed "Covered Medicare Preventive Services."

## 2019-04-09 ENCOUNTER — Other Ambulatory Visit: Payer: Self-pay | Admitting: Family Medicine

## 2019-04-09 DIAGNOSIS — G8929 Other chronic pain: Secondary | ICD-10-CM

## 2019-04-10 NOTE — Telephone Encounter (Signed)
Patient is requesting a refill of the following medications: Requested Prescriptions   Pending Prescriptions Disp Refills  . traMADol (ULTRAM) 50 MG tablet [Pharmacy Med Name: traMADol HCl 50 MG Oral Tablet] 120 tablet 0    Sig: TAKE 1 TABLET BY MOUTH EVERY 6 HOURS AS NEEDED    Date of patient request: 04/09/19 Last office visit: 03/04/19 Date of last refill: 02/27/19 Last refill amount:120 Follow up time period per chart: 05/13/19

## 2019-04-10 NOTE — Telephone Encounter (Signed)
Last filled 02/27/19.  Controlled substance database (PDMP) reviewed. No concerns appreciated.  Discussed at 02/11/19 OV.

## 2019-04-24 DIAGNOSIS — L814 Other melanin hyperpigmentation: Secondary | ICD-10-CM | POA: Diagnosis not present

## 2019-04-24 DIAGNOSIS — L570 Actinic keratosis: Secondary | ICD-10-CM | POA: Diagnosis not present

## 2019-04-24 DIAGNOSIS — D2361 Other benign neoplasm of skin of right upper limb, including shoulder: Secondary | ICD-10-CM | POA: Diagnosis not present

## 2019-04-24 DIAGNOSIS — Z85828 Personal history of other malignant neoplasm of skin: Secondary | ICD-10-CM | POA: Diagnosis not present

## 2019-04-24 DIAGNOSIS — D225 Melanocytic nevi of trunk: Secondary | ICD-10-CM | POA: Diagnosis not present

## 2019-04-24 DIAGNOSIS — L720 Epidermal cyst: Secondary | ICD-10-CM | POA: Diagnosis not present

## 2019-05-13 ENCOUNTER — Ambulatory Visit: Payer: Medicare Other | Admitting: Family Medicine

## 2019-05-14 ENCOUNTER — Other Ambulatory Visit: Payer: Self-pay | Admitting: Family Medicine

## 2019-05-14 DIAGNOSIS — G8929 Other chronic pain: Secondary | ICD-10-CM

## 2019-05-18 ENCOUNTER — Other Ambulatory Visit: Payer: Self-pay

## 2019-05-18 NOTE — Patient Outreach (Signed)
[facility]) Care Management  05/18/2019  Daniel [name] 25-May-1951 [id number]   Medication Adherence call to Daniel [name] [place] Identifiers Verify spoke with patient he is past due on Lisinopril 20 mg,patient explain he takes 1 tablet daily,patient had extras reason why he has not fill this prescription.Daniel [name] is past due under [place].   [place] Management Direct Dial [phone]  Fax [phone] [name]

## 2019-05-20 ENCOUNTER — Ambulatory Visit (INDEPENDENT_AMBULATORY_CARE_PROVIDER_SITE_OTHER): Payer: Medicare Other | Admitting: Family Medicine

## 2019-05-20 ENCOUNTER — Other Ambulatory Visit: Payer: Self-pay

## 2019-05-20 ENCOUNTER — Encounter: Payer: Self-pay | Admitting: Family Medicine

## 2019-05-20 VITALS — BP 147/83 | HR 81 | Temp 98.1°F | Ht 68.0 in | Wt 233.0 lb

## 2019-05-20 DIAGNOSIS — M25561 Pain in right knee: Secondary | ICD-10-CM | POA: Diagnosis not present

## 2019-05-20 DIAGNOSIS — G8929 Other chronic pain: Secondary | ICD-10-CM

## 2019-05-20 DIAGNOSIS — E1142 Type 2 diabetes mellitus with diabetic polyneuropathy: Secondary | ICD-10-CM | POA: Diagnosis not present

## 2019-05-20 DIAGNOSIS — K148 Other diseases of tongue: Secondary | ICD-10-CM

## 2019-05-20 DIAGNOSIS — M25562 Pain in left knee: Secondary | ICD-10-CM

## 2019-05-20 LAB — HEMOGLOBIN A1C
Est. average glucose Bld gHb Est-mCnc: 197 mg/dL
Hgb A1c MFr Bld: 8.5 % — ABNORMAL HIGH (ref 4.8–5.6)

## 2019-05-20 MED ORDER — TRAMADOL HCL 50 MG PO TABS: 50.0000 mg | ORAL_TABLET | Freq: Four times a day (QID) | ORAL | 0 refills | Status: DC | PRN

## 2019-05-20 NOTE — Progress Notes (Signed)
Subjective:  Patient ID: Daniel [name], male    DOB: 06/19/1951  Age: 68 y.o. MRN: [id number]  CC:  Chief Complaint  Patient presents with  . Follow-up    dm and htn and refill on pended med. Also wants pcp to look at tongue, bit tongue 6 months ago  . Pain    pain in legs and feet, wants to know if he is able to take lyrica    HPI [name] presents for   Diabetes: Associated with hyperglycemia and diabetic polyneuropathy, microalbuminuria.  Had  temporarily discontinued Victoza, decided to restart that medicine even with some concerns with cost.  Did recommend he discuss with pharmacist other lower cost options if needed.  He was continued on glimepiride and metformin same doses.  Continued on gabapentin as well for polyneuropathy in legs, feet.   Back on Victoza daily - rare missed dose. Off glimepiride for few days - at pharmacy usually misses 1-2 doses of meds per week.  No new side effects.  Taking gabapentin 1 in the morning and 1 at night at last visit, does have some chronic knee and shoulder issues that he is treated with turmeric, Arnica/hemp cream has helped. Also uses MSM as well as tramadol.  Numb/tingling not too bad, but more burning pain recently in lower leg/ankle bottom of foot.  Tramadol - up to 4 in a day with increased work recently. Taking 1 BID with often additional dose in afternoon if more work and pain in knees (3-4 per day at times). Still would like to  consider surgery once A1c improved - goal of under 7 by Dr. Durward Fortes.   Microalbumin: Ratio 34 in January Optho, foot exam, pneumovax: Up-to-date  He is on ACE inhibitor and statin.  Feels like diet could be improved, but less fast food - eating healthier at home. Portions may be issue.   Increased pork past few days.   Lab Results  Component Value Date   HGBA1C 8.0 (H) 02/11/2019   HGBA1C 8.1 (H) 07/29/2018   HGBA1C 7.8 (A) 02/11/2018   Lab Results  Component Value Date   MICROALBUR 15.1  05/03/2015   LDLCALC 99 02/11/2019   CREATININE 1.10 02/11/2019   Bump on tongue.  Bit 6 months ago. Swelling improved, then bigger past few months. Has not seen dentist/oral surgeon. Gets in the way and bites it with eating.   Only needed albuterol once in 4 months, not needing symbicort now.   History Patient Active Problem List   Diagnosis Date Noted  . Carpal tunnel syndrome, right upper limb 02/18/2018  . Anxiety and depression 10/28/2016  . Gastroesophageal reflux disease without esophagitis 10/28/2016  . Seasonal allergic rhinitis due to pollen 10/28/2016  . Primary osteoarthritis of left knee 07/25/2015  . Essential hypertension 07/25/2015  . Hypercholesteremia 07/25/2015  . Type 2 diabetes mellitus with diabetic polyneuropathy, without long-term current use of insulin ([place]) 07/25/2015   Past Medical History:  Diagnosis Date  . Allergic rhinitis   . Allergy   . Anxiety   . Arthritis   . Benign hypertension   . Cataract   . Depression   . Diabetes mellitus ([place])   . GERD (gastroesophageal reflux disease)   . History of pneumonia   . HLD (hyperlipidemia)   . Neuromuscular disorder [facility])    Past Surgical History:  Procedure Laterality Date  . CATARACT EXTRACTION     bilateral  . EYE SURGERY    . KNEE SURGERY  right   Allergies  Allergen Reactions  . Actos [Pioglitazone] Other (See Comments), Shortness Of Breath and Swelling    Blood sugar went up and down and sweating profusely  . Codeine Nausea Only and Palpitations    Palpitations, SOB   Prior to Admission medications   Medication Sig Start Date End Date Taking? Authorizing Provider  albuterol (PROVENTIL HFA;VENTOLIN HFA) 108 (90 Base) MCG/ACT inhaler Inhale 2 puffs into the lungs every 4 (four) hours as needed for wheezing or shortness of breath. Reported on 10/19/2015 10/19/15   Shawnee Knapp, MD  aspirin 81 MG tablet Take 81 mg by mouth daily.    [provider]  blood glucose meter kit and  supplies 1 each by Other route as directed. Dispense based on patient and insurance preference. Check once per day (variable times).  Accu check. 07/29/18   Wendie Agreste, MD  budesonide-formoterol [facility]) 80-4.5 MCG/ACT inhaler Inhale 2 puffs into the lungs 2 (two) times daily. 09/13/16   Wardell Honour, MD  cetirizine (ZYRTEC) 10 MG tablet Take 10 mg by mouth daily.    [provider]  Cyanocobalamin (VITAMIN B 12 PO) Take 1,000 mg by mouth daily.    [provider]  gabapentin (NEURONTIN) 300 MG capsule Take 1-2 capsules (300-600 mg total) by mouth 2 (two) times daily as needed. 02/11/19   Wendie Agreste, MD  glimepiride (AMARYL) 4 MG tablet Take 1 tablet (4 mg total) by mouth 2 (two) times daily. 07/29/18   Wendie Agreste, MD  glucose blood (ONE TOUCH ULTRA TEST) test strip Test Twice a day as directed 10/27/14   [provider]  glucose blood test strip check sugars once daily. Dx: E11.42 11/25/18   Wendie Agreste, MD  ibuprofen (ADVIL,MOTRIN) 200 MG tablet Take 800 mg by mouth every morning.    [provider]  Lancets (ONETOUCH DELICA PLUS [id number]) [place] USE 1 TO CHECK GLUCOSE ONCE DAILY 08/04/18   [provider]  liraglutide (VICTOZA) 18 MG/3ML SOPN Inject 0.3 mLs (1.8 mg total) into the skin daily. I 10/20/18   Wendie Agreste, MD  lisinopril (PRINIVIL,ZESTRIL) 20 MG tablet Take 1 tablet (20 mg total) by mouth daily. 07/29/18   Wendie Agreste, MD  metFORMIN (GLUCOPHAGE) 1000 MG tablet Take 1 tablet (1,000 mg total) by mouth 2 (two) times daily with a meal. 07/29/18   Wendie Agreste, MD  montelukast (SINGULAIR) 10 MG tablet Take 1 tablet (10 mg total) by mouth at bedtime. 11/25/18   Wendie Agreste, MD  omeprazole (PRILOSEC) 20 MG capsule Take 1 capsule (20 mg total) by mouth daily. 11/14/17   Wendie Agreste, MD  [facility] ULTRA test strip USE 1 STRIP TO CHECK GLUCOSE ONCE DAILY 11/25/18   Wendie Agreste, MD  pravastatin  (PRAVACHOL) 80 MG tablet Take 1 tablet (80 mg total) by mouth daily. 02/11/19   Wendie Agreste, MD  sertraline (ZOLOFT) 100 MG tablet Take 2 tablets (200 mg total) by mouth daily. Start with one per day for initial 2 weeks, then increase to 2 per day. 02/11/19   Wendie Agreste, MD  traMADol (ULTRAM) 50 MG tablet TAKE 1 TABLET BY MOUTH EVERY 6 HOURS AS NEEDED 04/10/19   Wendie Agreste, MD   Social History   Socioeconomic History  . Marital status: Married    Spouse name: Not on file  . Number of children: 3  . Years of education: Not on file  .  Highest education level: Not on file  Occupational History  . Occupation: Dealer, Animal nutritionist Needs  . Financial resource strain: Not on file  . Food insecurity    Worry: Not on file    Inability: Not on file  . Transportation needs    Medical: Not on file    Non-medical: Not on file  Tobacco Use  . Smoking status: Never Smoker  . Smokeless tobacco: Never Used  Substance and Sexual Activity  . Alcohol use: No  . Drug use: No  . Sexual activity: Not Currently  Lifestyle  . Physical activity    Days per week: Not on file    Minutes per session: Not on file  . Stress: Not on file  Relationships  . Social Herbalist on phone: Not on file    Gets together: Not on file    Attends religious service: Not on file    Active member of club or organization: Not on file    Attends meetings of clubs or organizations: Not on file    Relationship status: Not on file  . Intimate partner violence    Fear of current or ex partner: Not on file    Emotionally abused: Not on file    Physically abused: Not on file    Forced sexual activity: Not on file  Other Topics Concern  . Not on file  Social History Narrative  . Not on file    Review of Systems  Constitutional: Negative for fatigue and unexpected weight change.  Eyes: Negative for visual disturbance.  Respiratory: Negative for cough, chest tightness and  shortness of breath.   Cardiovascular: Negative for chest pain, palpitations and leg swelling.  Gastrointestinal: Negative for abdominal pain and blood in stool.  Neurological: Negative for dizziness, light-headedness and headaches.     Objective:   Vitals:   05/20/19 1040  BP: (!) 147/83  Pulse: 81  Temp: 98.1 F (36.7 C)  SpO2: 98%  Weight: 233 lb (105.7 kg)  Height: _0  (1.727 m)     Physical Exam Vitals signs reviewed.  Constitutional:      Appearance: He is well-developed.  HENT:     Head: Normocephalic and atraumatic.     Mouth/Throat:     Comments: 37m projection on R lateral tongue mid aspect. Flesh colored.  Eyes:     Pupils: Pupils are equal, round, and reactive to light.  Neck:     Vascular: No carotid bruit or JVD.  Cardiovascular:     Rate and Rhythm: Normal rate and regular rhythm.     Heart sounds: Normal heart sounds. No murmur.  Pulmonary:     Effort: Pulmonary effort is normal.     Breath sounds: Normal breath sounds. No rales.  Skin:    General: Skin is warm and dry.  Neurological:     Mental Status: He is alert and oriented to person, place, and time.       Assessment & Plan:  DTYCE DELCIDis a 68y.o. male . Type 2 diabetes mellitus with diabetic polyneuropathy, without long-term current use of insulin ([place] - Plan: Hemoglobin A1c  -Anticipate some improved readings as off Victoza temporarily last visit.  Continue same regimen for now.  Plan is also to adjust diet.  Recheck levels in 3 months, with med adjustments possible at that time if persistent elevation.  -Trial of higher dose of gabapentin with potential side effects discussed.  Initially 300 mg  in a.m., 600 mg in p.m., or 600 mg twice daily.  Chronic pain of both knees - Plan: traMADol (ULTRAM) 50 MG tablet  -Plan to discuss with his surgeon potentially early next year once A1c improved.  Continue tramadol for now. Controlled substance database (PDMP) reviewed. No concerns  appreciated. Last Rx filled 10/10, prior 8/28.   Tongue lesion - Plan: Ambulatory referral to Oral Maxillofacial Surgery  -Posttraumatic, differential includes proliferative scar tissue, granuloma but no active bleeding/raw area.  Referred to oral surgeon for evaluation and excision.  No orders of the defined types were placed in this encounter.  Patient Instructions       If you have lab work done today you will be contacted with your lab results within the next 2 weeks.  If you have not heard from [place] then please contact us. The fastest way to get your results is to register for My Chart.   IF you received an x-ray today, you will receive an invoice from [facility]. Please contact [facility] at [phone] with questions or concerns regarding your invoice.   IF you received labwork today, you will receive an invoice from [place]. Please contact [facility] at [phone] with questions or concerns regarding your invoice.   Our billing staff will not be able to assist you with questions regarding bills from these companies.  You will be contacted with the lab results as soon as they are available. The fastest way to get your results is to activate your My Chart account. Instructions are located on the last page of this paperwork. If you have not heard from [place] regarding the results in 2 weeks, please contact this office.         Signed, Merri Ray, MD [facility]

## 2019-05-20 NOTE — Patient Instructions (Addendum)
   Try taking up to 2 of gabapentin twice per day to see if that helps with foot pain.  We have options of increasing dose further if needed.  Please let me know.   No change in diabetes meds for now until I see the A1c.  Keep up the good work with diet changes, watch portion sizes, and recheck levels in 3 months.  No change in tramadol for now but as diabetes control improves, would recommend meeting with your surgeon to discuss knee surgery options.  I will refer you to an oral surgeon for the lesion on the side of the tongue.  Thanks for coming in today and let me know if there are questions.    If you have lab work done today you will be contacted with your lab results within the next 2 weeks.  If you have not heard from [place] then please contact us. The fastest way to get your results is to register for My Chart.   IF you received an x-ray today, you will receive an invoice from [facility]. Please contact [facility] at [phone] with questions or concerns regarding your invoice.   IF you received labwork today, you will receive an invoice from [facility]. Please contact [facility] at [phone] with questions or concerns regarding your invoice.   Our billing staff will not be able to assist you with questions regarding bills from these companies.  You will be contacted with the lab results as soon as they are available. The fastest way to get your results is to activate your My Chart account. Instructions are located on the last page of this paperwork. If you have not heard from [place] regarding the results in 2 weeks, please contact this office.

## 2019-06-01 ENCOUNTER — Encounter: Payer: Self-pay | Admitting: Radiology

## 2019-06-19 ENCOUNTER — Other Ambulatory Visit: Payer: Self-pay | Admitting: Family Medicine

## 2019-06-19 DIAGNOSIS — G8929 Other chronic pain: Secondary | ICD-10-CM

## 2019-06-19 NOTE — Telephone Encounter (Signed)
Requested medication (s) are due for refill today: yes  Requested medication (s) are on the active medication list: yes  Last refill: 05/20/2019  Future visit scheduled: yes  Notes to clinic:  refill cannot be delegated    Requested Prescriptions  Pending Prescriptions Disp Refills   traMADol (ULTRAM) 50 MG tablet [Pharmacy Med Name: traMADol HCl 50 MG Oral Tablet] 120 tablet 0    Sig: TAKE 1 TABLET BY MOUTH EVERY 6 HOURS AS NEEDED      Not Delegated - Analgesics:  Opioid Agonists Failed - 06/19/2019 10:18 AM      Failed - This refill cannot be delegated      Failed - Urine Drug Screen completed in last 360 days.      Passed - Valid encounter within last 6 months    Recent Outpatient Visits           1 month ago Type 2 diabetes mellitus with diabetic polyneuropathy, without long-term current use of insulin [facility])   [facility] at [facility], Ranell Patrick, MD   3 months ago Medicare annual wellness visit, subsequent   [facility] at [facility], Ranell Patrick, MD   4 months ago Type 2 diabetes mellitus with diabetic polyneuropathy, without long-term current use of insulin [facility])   [facility] at [facility], Ranell Patrick, MD   8 months ago Chronic pain of both knees   [facility] at [facility], Ranell Patrick, MD   10 months ago Type 2 diabetes mellitus with diabetic polyneuropathy, without long-term current use of insulin [facility])   [facility] at [facility], Ranell Patrick, MD       Future Appointments             In 2 months Carlota Raspberry Ranell Patrick, MD Primary Care at [place], [facility]

## 2019-06-19 NOTE — Telephone Encounter (Signed)
Patient is requesting a refill of the following medications: Requested Prescriptions   Pending Prescriptions Disp Refills   traMADol (ULTRAM) 50 MG tablet [Pharmacy Med Name: traMADol HCl 50 MG Oral Tablet] 120 tablet 0    Sig: TAKE 1 TABLET BY MOUTH EVERY 6 HOURS AS NEEDED    Date of patient request: 06/19/19 Last office visit: 05/20/19 Date of last refill: 05/20/19 Last refill amount: 120 Follow up time period per chart: 08/20/2019

## 2019-06-20 NOTE — Telephone Encounter (Signed)
Plan discussed at office visit in November. Controlled substance database (PDMP) reviewed. No concerns appreciated.  Last filled on November 18.  Refill ordered.

## 2019-06-29 ENCOUNTER — Other Ambulatory Visit: Payer: Self-pay | Admitting: Oral Surgery

## 2019-06-29 DIAGNOSIS — D101 Benign neoplasm of tongue: Secondary | ICD-10-CM | POA: Diagnosis not present

## 2019-07-21 ENCOUNTER — Other Ambulatory Visit: Payer: Self-pay | Admitting: Family Medicine

## 2019-07-21 DIAGNOSIS — G8929 Other chronic pain: Secondary | ICD-10-CM

## 2019-07-21 DIAGNOSIS — M25562 Pain in left knee: Secondary | ICD-10-CM

## 2019-07-22 NOTE — Telephone Encounter (Signed)
Office visit in November, last prescription reviewed from December 19.  Refill ordered. Controlled substance database (PDMP) reviewed. No concerns appreciated.

## 2019-08-13 ENCOUNTER — Other Ambulatory Visit: Payer: Self-pay

## 2019-08-13 NOTE — Patient Outreach (Signed)
[facility]) Care Management  08/13/2019  HOVSEP SINE Apr 08, 1951 [id number]   Medication Adherence call to Mr. Wing Gaines HIPPA Compliant Voice message left with a call back number. Mr. Alba is showing past due on Victoza under [place].   [facility] Management Direct Dial [phone]  Fax [phone] [email or web address]

## 2019-08-20 ENCOUNTER — Ambulatory Visit: Payer: Medicare Other | Admitting: Family Medicine

## 2019-08-21 ENCOUNTER — Other Ambulatory Visit: Payer: Self-pay | Admitting: Family Medicine

## 2019-08-21 DIAGNOSIS — E1142 Type 2 diabetes mellitus with diabetic polyneuropathy: Secondary | ICD-10-CM

## 2019-08-21 NOTE — Telephone Encounter (Signed)
Requested Prescriptions  Pending Prescriptions Disp Refills  . metFORMIN (GLUCOPHAGE) 1000 MG tablet [Pharmacy Med Name: metFORMIN HCl 1000 MG Oral Tablet] 180 tablet 0    Sig: TAKE 1 TABLET BY MOUTH TWICE DAILY WITH A MEAL     Endocrinology:  Diabetes - Biguanides Failed - 08/21/2019  9:47 AM      Failed - HBA1C is between 0 and 7.9 and within 180 days    Hgb A1c MFr Bld  Date Value Ref Range Status  05/20/2019 8.5 (H) 4.8 - 5.6 % Final    Comment:             Prediabetes: 5.7 - 6.4          Diabetes: >6.4          Glycemic control for adults with diabetes: <7.0          Passed - Cr in normal range and within 360 days    Creat  Date Value Ref Range Status  02/28/2016 0.95 0.70 - 1.25 mg/dL Final    Comment:      For patients > or = 71 years of age: The upper reference limit for Creatinine is approximately 13% higher for people identified as African-American.      Creatinine, Ser  Date Value Ref Range Status  02/11/2019 1.10 0.76 - 1.27 mg/dL Final         Passed - eGFR in normal range and within 360 days    GFR calc Af Amer  Date Value Ref Range Status  02/11/2019 78 >59 mL/min/1.73 Final   GFR calc non Af Amer  Date Value Ref Range Status  02/11/2019 68 >59 mL/min/1.73 Final         Passed - Valid encounter within last 6 months    Recent Outpatient Visits          3 months ago Type 2 diabetes mellitus with diabetic polyneuropathy, without long-term current use of insulin ([place])   [facility] at [facility], Ranell Patrick, MD   5 months ago Medicare annual wellness visit, subsequent   [facility] at [facility], Ranell Patrick, MD   6 months ago Type 2 diabetes mellitus with diabetic polyneuropathy, without long-term current use of insulin [facility])   [facility] at [facility], Ranell Patrick, MD   10 months ago Chronic pain of both knees   [facility] at [facility], Ranell Patrick, MD   1 year ago Type 2 diabetes mellitus with diabetic polyneuropathy, without long-term  current use of insulin [facility])   Primary Care at [facility], Ranell Patrick, MD      Future Appointments            In 2 weeks Carlota Raspberry Ranell Patrick, MD Primary Care at [place], [facility]           . glimepiride (AMARYL) 4 MG tablet [Pharmacy Med Name: Glimepiride 4 MG Oral Tablet] 180 tablet 0    Sig: Take 1 tablet by mouth twice daily     Endocrinology:  Diabetes - Sulfonylureas Failed - 08/21/2019  9:47 AM      Failed - HBA1C is between 0 and 7.9 and within 180 days    Hgb A1c MFr Bld  Date Value Ref Range Status  05/20/2019 8.5 (H) 4.8 - 5.6 % Final    Comment:             Prediabetes: 5.7 - 6.4          Diabetes: >6.4  Glycemic control for adults with diabetes: <7.0          Passed - Valid encounter within last 6 months    Recent Outpatient Visits          3 months ago Type 2 diabetes mellitus with diabetic polyneuropathy, without long-term current use of insulin [facility])   [facility] at [facility], Ranell Patrick, MD   5 months ago Medicare annual wellness visit, subsequent   [facility] at [facility], Ranell Patrick, MD   6 months ago Type 2 diabetes mellitus with diabetic polyneuropathy, without long-term current use of insulin [facility])   [facility] at [facility], Ranell Patrick, MD   10 months ago Chronic pain of both knees   [facility] at [facility], Ranell Patrick, MD   1 year ago Type 2 diabetes mellitus with diabetic polyneuropathy, without long-term current use of insulin [facility])   Primary [facility] at [place] [facility], Ranell Patrick, MD      Future Appointments            In 2 weeks Carlota Raspberry Ranell Patrick, MD Primary Care at [place], [facility]

## 2019-08-30 ENCOUNTER — Other Ambulatory Visit: Payer: Self-pay | Admitting: Family Medicine

## 2019-08-30 DIAGNOSIS — G8929 Other chronic pain: Secondary | ICD-10-CM

## 2019-08-30 NOTE — Telephone Encounter (Signed)
Requested medication (s) are due for refill today: yes  Requested medication (s) are on the active medication list: yes  Last refill: 07/22/19  Future visit scheduled: yes  Notes to clinic:  refill not delegated to NT   Requested Prescriptions  Pending Prescriptions Disp Refills   traMADol (ULTRAM) 50 MG tablet [Pharmacy Med Name: traMADol HCl 50 MG Oral Tablet] 120 tablet 0    Sig: TAKE 1 TABLET BY MOUTH EVERY 6 HOURS AS NEEDED      Not Delegated - Analgesics:  Opioid Agonists Failed - 08/30/2019 11:55 AM      Failed - This refill cannot be delegated      Failed - Urine Drug Screen completed in last 360 days.      Passed - Valid encounter within last 6 months    Recent Outpatient Visits           3 months ago Type 2 diabetes mellitus with diabetic polyneuropathy, without long-term current use of insulin [facility])   [facility] at [facility], Ranell Patrick, MD   5 months ago Medicare annual wellness visit, subsequent   [facility] at [facility], Ranell Patrick, MD   6 months ago Type 2 diabetes mellitus with diabetic polyneuropathy, without long-term current use of insulin [facility])   [facility] at [facility] [facility], Ranell Patrick, MD   10 months ago Chronic pain of both knees   [facility] at [facility], Ranell Patrick, MD   1 year ago Type 2 diabetes mellitus with diabetic polyneuropathy, without long-term current use of insulin [facility])   Primary [facility] at [facility], Ranell Patrick, MD       Future Appointments             In 1 week Carlota Raspberry Ranell Patrick, MD Primary Care at [place], [facility]

## 2019-08-31 NOTE — Telephone Encounter (Signed)
Patient is requesting a refill of the following medications: Requested Prescriptions   Pending Prescriptions Disp Refills  . traMADol (ULTRAM) 50 MG tablet [Pharmacy Med Name: traMADol HCl 50 MG Oral Tablet] 120 tablet 0    Sig: TAKE 1 TABLET BY MOUTH EVERY 6 HOURS AS NEEDED    Date of patient request: 08/30/2019 Last office visit: 05/20/2019 Date of last refill: 07/28/2019 Last refill amount: 120 tablets  Follow up time period per chart: Appointment Scheduled

## 2019-09-01 NOTE — Telephone Encounter (Signed)
Controlled substance database (PDMP) reviewed. No concerns appreciated.  Last filled tramadol on January 20.  Office visit in November.  Refill ordered.

## 2019-09-07 ENCOUNTER — Other Ambulatory Visit: Payer: Self-pay

## 2019-09-07 ENCOUNTER — Encounter: Payer: Self-pay | Admitting: Family Medicine

## 2019-09-07 ENCOUNTER — Ambulatory Visit (INDEPENDENT_AMBULATORY_CARE_PROVIDER_SITE_OTHER): Payer: Medicare Other | Admitting: Family Medicine

## 2019-09-07 VITALS — BP 144/80 | HR 80 | Temp 97.6°F | Ht 68.0 in | Wt 236.0 lb

## 2019-09-07 DIAGNOSIS — K219 Gastro-esophageal reflux disease without esophagitis: Secondary | ICD-10-CM | POA: Diagnosis not present

## 2019-09-07 DIAGNOSIS — F32A Depression, unspecified: Secondary | ICD-10-CM

## 2019-09-07 DIAGNOSIS — I10 Essential (primary) hypertension: Secondary | ICD-10-CM

## 2019-09-07 DIAGNOSIS — E1142 Type 2 diabetes mellitus with diabetic polyneuropathy: Secondary | ICD-10-CM | POA: Diagnosis not present

## 2019-09-07 DIAGNOSIS — E7800 Pure hypercholesterolemia, unspecified: Secondary | ICD-10-CM

## 2019-09-07 DIAGNOSIS — J4520 Mild intermittent asthma, uncomplicated: Secondary | ICD-10-CM

## 2019-09-07 DIAGNOSIS — F329 Major depressive disorder, single episode, unspecified: Secondary | ICD-10-CM

## 2019-09-07 DIAGNOSIS — R062 Wheezing: Secondary | ICD-10-CM | POA: Diagnosis not present

## 2019-09-07 LAB — HEMOGLOBIN A1C
Est. average glucose Bld gHb Est-mCnc: 192 mg/dL
Hgb A1c MFr Bld: 8.3 % — ABNORMAL HIGH (ref 4.8–5.6)

## 2019-09-07 MED ORDER — GLIMEPIRIDE 4 MG PO TABS: 4.0000 mg | ORAL_TABLET | Freq: Two times a day (BID) | ORAL | 0 refills | Status: DC

## 2019-09-07 MED ORDER — OMEPRAZOLE 20 MG PO CPDR: 20.0000 mg | DELAYED_RELEASE_CAPSULE | Freq: Every day | ORAL | 3 refills | Status: DC

## 2019-09-07 MED ORDER — MONTELUKAST SODIUM 10 MG PO TABS: 10.0000 mg | ORAL_TABLET | Freq: Every day | ORAL | 1 refills | Status: DC

## 2019-09-07 MED ORDER — LIRAGLUTIDE 18 MG/3ML ~~LOC~~ SOPN: 1.8000 mg | PEN_INJECTOR | Freq: Every day | SUBCUTANEOUS | 5 refills | Status: DC

## 2019-09-07 MED ORDER — ALBUTEROL SULFATE HFA 108 (90 BASE) MCG/ACT IN AERS: 1.0000 | INHALATION_SPRAY | RESPIRATORY_TRACT | 0 refills | Status: DC | PRN

## 2019-09-07 MED ORDER — PRAVASTATIN SODIUM 80 MG PO TABS: 80.0000 mg | ORAL_TABLET | Freq: Every day | ORAL | 1 refills | Status: DC

## 2019-09-07 MED ORDER — SERTRALINE HCL 100 MG PO TABS: 200.0000 mg | ORAL_TABLET | Freq: Every day | ORAL | 1 refills | Status: DC

## 2019-09-07 MED ORDER — LISINOPRIL 20 MG PO TABS: 20.0000 mg | ORAL_TABLET | Freq: Every day | ORAL | 3 refills | Status: DC

## 2019-09-07 NOTE — Patient Instructions (Signed)
° ° ° °  If you have lab work done today you will be contacted with your lab results within the next 2 weeks.  If you have not heard from us then please contact us. The fastest way to get your results is to register for My Chart. ° ° °IF you received an x-ray today, you will receive an invoice from [facility]. Please contact [facility] at [phone] with questions or concerns regarding your invoice.  ° °IF you received labwork today, you will receive an invoice from [facility]. Please contact [facility] at [phone] with questions or concerns regarding your invoice.  ° °Our billing staff will not be able to assist you with questions regarding bills from these companies. ° °You will be contacted with the lab results as soon as they are available. The fastest way to get your results is to activate your My Chart account. Instructions are located on the last page of this paperwork. If you have not heard from us regarding the results in 2 weeks, please contact this office. °  ° ° ° °

## 2019-09-07 NOTE — Progress Notes (Signed)
Subjective:  Patient ID: Daniel [name], male    DOB: Jan 29, 1951  Age: 69 y.o. MRN: [id number]  CC:  Chief Complaint  Patient presents with  . Follow-up    on type 2 type 2 diabetes. pt hasn't hasn't had any issues with his BS since last visit. pt checks BS daily every morning fasting. pt had a few high BS resently. 187,191 and 171 mg/dlL pt states he is normaly around 140 and 150 mg/dL. pt dose feel vary fatigued today.    HPI Daniel [name] presents for  Diabetes: Complicated by hyperglycemia, diabetic polyneuropathy, microalbuminuria.  Last discussed in November.  At that time he had reported being back on Victoza daily, has been off glimepiride for few days and missed 1 to 2 days of medication per week.  No new side effects.  Was taking gabapentin 1 in the morning 1 at night for polyneuropathy.  Chronic knee and shoulder issues that were treated with turmeric, Arnica hemp cream, MSM, and tramadol.  Some increased foot symptoms at that time.  Was improving diet with eating healthier at home. Recommended higher dose of gabapentin with 300 mg morning, 600 mg at night or 600 mg twice per day if needed. - doing well at 3109m QAM, 6082mQPM Elevated A1c -  recommended he continue Metformin 10062mID, Victoza 1.8mg59mily.back on glimepiride 4mg 81mand follow-up in 6 weeks with home readings.  Last visit was in November.   He is on ACE inhibitor and statin. Microalbumin: Elevated ratio of 34 in January 2020 Optho, foot exam, pneumovax: Due for ophthalmology exam - plans on appt in April.  Some fatigue with long work days past few weeks - plans on rest.   Missed 4 days of Victoza last week. Compliant with other meds.  Home readings 135-150, 86 lowest. High readings past few weeks - 201, 186, 178, 160.   Lab Results  Component Value Date   HGBA1C 8.3 (H) 09/07/2019   HGBA1C 8.5 (H) 05/20/2019   HGBA1C 8.0 (H) 02/11/2019   Lab Results  Component Value Date   MICROALBUR 15.1 05/03/2015   LDLCALC 99 02/11/2019   CREATININE 1.10 02/11/2019    Chronic knee pain: See previous notes.  Have been treating with tramadol until improved A1c with potential surgery once diabetes improved.  Orthopedic Dr. WhitfDurward Fortesst tramadol refill #120 on March 2nd.  Taking 4 tramadol per day. Controlling pain. No new side effects. Some constipation.  Taking ibuprofen 800mg 71mday.  Ok with acetaminophen and ibuprofen combo.  Planning on meeting with surgeon to schedule total L knee in April or May.  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   Asthma: Usually during allergy season. No albuterol in 2 years - needs refill.  Out of singulair - usually daily. On zyrtec daily. No recent symbicort.   GERD: Omeprazole daily.   Anxiety/Depression: Controlled with zoloft 200mg q14mNo flowsheet data found.   Depression screen PHQ 2/9[facility]/20[facility] 05/20/2019 03/04/2019 02/11/2019 10/20/2018  Decreased Interest 0 0 0 0 0  Down, Depressed, Hopeless 0 0 0 0 0  PHQ - 2 Score 0 0 0 0 0      History Patient Active Problem List   Diagnosis Date Noted  . Carpal tunnel syndrome, right upper limb 02/18/2018  . Anxiety and depression 10/28/2016  . Gastroesophageal reflux disease without esophagitis  10/28/2016  . Seasonal allergic rhinitis due to pollen 10/28/2016  . Primary osteoarthritis of left knee 07/25/2015  . Essential hypertension 07/25/2015  . Hypercholesteremia 07/25/2015  . Type 2 diabetes mellitus with diabetic polyneuropathy, without long-term current use of insulin ([place]) 07/25/2015   Past Medical History:  Diagnosis Date  . Allergic rhinitis   . Allergy   . Anxiety   . Arthritis   . Benign hypertension   . Cataract   . Depression   . Diabetes mellitus ([place])   . GERD (gastroesophageal reflux disease)   . History of pneumonia   . HLD (hyperlipidemia)   . Neuromuscular  disorder [facility])    Past Surgical History:  Procedure Laterality Date  . CATARACT EXTRACTION     bilateral  . EYE SURGERY    . KNEE SURGERY     right   Allergies  Allergen Reactions  . Actos [Pioglitazone] Other (See Comments), Shortness Of Breath and Swelling    Blood sugar went up and down and sweating profusely  . Codeine Nausea Only and Palpitations    Palpitations, SOB   Prior to Admission medications   Medication Sig Start Date End Date Taking? Authorizing Provider  albuterol (PROVENTIL HFA;VENTOLIN HFA) 108 (90 Base) MCG/ACT inhaler Inhale 2 puffs into the lungs every 4 (four) hours as needed for wheezing or shortness of breath. Reported on 10/19/2015 10/19/15  Yes Shawnee Knapp, MD  aspirin 81 MG tablet Take 81 mg by mouth daily.   Yes [provider]  blood glucose meter kit and supplies 1 each by Other route as directed. Dispense based on patient and insurance preference. Check once per day (variable times).  Accu check. 07/29/18  Yes Wendie Agreste, MD  budesonide-formoterol [facility]) 80-4.5 MCG/ACT inhaler Inhale 2 puffs into the lungs 2 (two) times daily. 09/13/16  Yes Wardell Honour, MD  cetirizine (ZYRTEC) 10 MG tablet Take 10 mg by mouth daily.   Yes [provider]  Cyanocobalamin (VITAMIN B 12 PO) Take 1,000 mg by mouth daily.   Yes [provider]  gabapentin (NEURONTIN) 300 MG capsule Take 1-2 capsules (300-600 mg total) by mouth 2 (two) times daily as needed. 02/11/19  Yes Wendie Agreste, MD  glimepiride (AMARYL) 4 MG tablet Take 1 tablet by mouth twice daily 08/21/19  Yes Wendie Agreste, MD  glucose blood (ONE TOUCH ULTRA TEST) test strip Test Twice a day as directed 10/27/14  Yes [provider]  glucose blood test strip check sugars once daily. Dx: E11.42 11/25/18  Yes Wendie Agreste, MD  ibuprofen (ADVIL,MOTRIN) 200 MG tablet Take 800 mg by mouth every morning.   Yes [provider]  Lancets (ONETOUCH DELICA PLUS  [id number]) [place] USE 1 TO CHECK GLUCOSE ONCE DAILY 08/04/18  Yes [provider]  liraglutide (VICTOZA) 18 MG/3ML SOPN Inject 0.3 mLs (1.8 mg total) into the skin daily. I 10/20/18  Yes Wendie Agreste, MD  lisinopril (PRINIVIL,ZESTRIL) 20 MG tablet Take 1 tablet (20 mg total) by mouth daily. 07/29/18  Yes Wendie Agreste, MD  metFORMIN (GLUCOPHAGE) 1000 MG tablet TAKE 1 TABLET BY MOUTH TWICE DAILY WITH A MEAL 08/21/19  Yes Wendie Agreste, MD  montelukast (SINGULAIR) 10 MG tablet Take 1 tablet (10 mg total) by mouth at bedtime. 11/25/18  Yes Wendie Agreste, MD  omeprazole (PRILOSEC) 20 MG capsule Take 1 capsule (20 mg total) by mouth daily. 11/14/17  Yes Wendie Agreste, MD  [facility] ULTRA test  strip USE 1 STRIP TO CHECK GLUCOSE ONCE DAILY 11/25/18  Yes Wendie Agreste, MD  pravastatin (PRAVACHOL) 80 MG tablet Take 1 tablet (80 mg total) by mouth daily. 02/11/19  Yes Wendie Agreste, MD  sertraline (ZOLOFT) 100 MG tablet Take 2 tablets (200 mg total) by mouth daily. Start with one per day for initial 2 weeks, then increase to 2 per day. 02/11/19  Yes Wendie Agreste, MD  traMADol (ULTRAM) 50 MG tablet TAKE 1 TABLET BY MOUTH EVERY 6 HOURS AS NEEDED 09/01/19  Yes Wendie Agreste, MD   Social History   Socioeconomic History  . Marital status: Married    Spouse name: Not on file  . Number of children: 3  . Years of education: Not on file  . Highest education level: Not on file  Occupational History  . Occupation: Dealer, Company secretary  Tobacco Use  . Smoking status: Never Smoker  . Smokeless tobacco: Never Used  Substance and Sexual Activity  . Alcohol use: No  . Drug use: No  . Sexual activity: Not Currently  Other Topics Concern  . Not on file  Social History Narrative  . Not on file   Social Determinants of Health   Financial Resource Strain:   . Difficulty of Paying Living Expenses: Not on file  Food Insecurity:   . Worried About Charity fundraiser in the  Last Year: Not on file  . Ran Out of Food in the Last Year: Not on file  Transportation Needs:   . Lack of Transportation (Medical): Not on file  . Lack of Transportation (Non-Medical): Not on file  Physical Activity:   . Days of Exercise per Week: Not on file  . Minutes of Exercise per Session: Not on file  Stress:   . Feeling of Stress : Not on file  Social Connections:   . Frequency of Communication with Friends and Family: Not on file  . Frequency of Social Gatherings with Friends and Family: Not on file  . Attends Religious Services: Not on file  . Active Member of Clubs or Organizations: Not on file  . Attends Archivist Meetings: Not on file  . Marital Status: Not on file  Intimate Partner Violence:   . Fear of Current or Ex-Partner: Not on file  . Emotionally Abused: Not on file  . Physically Abused: Not on file  . Sexually Abused: Not on file    Review of Systems  Constitutional: Negative for unexpected weight change.  Eyes: Negative for visual disturbance.  Respiratory: Negative for cough, chest tightness and shortness of breath.   Cardiovascular: Negative for chest pain, palpitations and leg swelling.  Gastrointestinal: Negative for abdominal pain and blood in stool.  Musculoskeletal: Negative for arthralgias (knees).  Neurological: Negative for dizziness, light-headedness and headaches.     Objective:   Vitals:   09/07/19 1038 09/07/19 1046  BP: (!) 152/81 (!) 144/80  Pulse: 80   Temp: 97.6 F (36.4 C)   TempSrc: Temporal   SpO2: 95%   Weight: 236 lb (107 kg)   Height: '5\' 8"'  (1.727 m)      Physical Exam Vitals reviewed.  Constitutional:      Appearance: He is well-developed.  HENT:     Head: Normocephalic and atraumatic.  Eyes:     Pupils: Pupils are equal, round, and reactive to light.  Neck:     Vascular: No carotid bruit or JVD.  Cardiovascular:     Rate and  Rhythm: Normal rate and regular rhythm.     Heart sounds: Normal heart  sounds. No murmur.  Pulmonary:     Effort: Pulmonary effort is normal.     Breath sounds: Normal breath sounds. No rales.  Skin:    General: Skin is warm and dry.  Neurological:     Mental Status: He is alert and oriented to person, place, and time.      Assessment & Plan:  Daniel [name] is a 69 y.o. male . Type 2 diabetes mellitus with diabetic polyneuropathy, without long-term current use of insulin (HCC) - Plan: Hemoglobin A1c, Microalbumin / creatinine urine ratio, liraglutide (VICTOZA) 18 MG/3ML SOPN, glimepiride (AMARYL) 4 MG tablet  -Check A1c, urine microalbumin.  May need med adjustment or possibly insulin depending on readings.  Essential hypertension - Plan: lisinopril (ZESTRIL) 20 MG tablet  -Above goal but plans on diet changes, continue same dose lisinopril for now.  Gastroesophageal reflux disease - Plan: omeprazole (PRILOSEC) 20 MG capsule  -Stable, continue omeprazole  Hypercholesteremia - Plan: pravastatin (PRAVACHOL) 80 MG tablet  -Tolerating statin, continue same  Mild intermittent asthma without complication - Plan: albuterol (VENTOLIN HFA) 108 (90 Base) MCG/ACT inhaler -  Wheezing - Plan: albuterol (VENTOLIN HFA) 108 (90 Base) MCG/ACT inhaler, montelukast (SINGULAIR) 10 MG tablet  -Continue Singulair, albuterol as needed with RTC precautions if increased use.  Depression, unspecified depression type - Plan: sertraline (ZOLOFT) 100 MG tablet  Stable with current use of Zoloft, continue same.  Follow-up planned with orthopedics for consideration of total left knee surgery.  Tramadol has been refilled until further treatment.  No concerns without medication at this time.  Meds ordered this encounter  Medications  . albuterol (VENTOLIN HFA) 108 (90 Base) MCG/ACT inhaler    Sig: Inhale 1-2 puffs into the lungs every 4 (four) hours as needed for wheezing or shortness of breath. Reported on 10/19/2015    Dispense:  18 g    Refill:  0  . lisinopril (ZESTRIL) 20  MG tablet    Sig: Take 1 tablet (20 mg total) by mouth daily.    Dispense:  90 tablet    Refill:  3  . omeprazole (PRILOSEC) 20 MG capsule    Sig: Take 1 capsule (20 mg total) by mouth daily.    Dispense:  90 capsule    Refill:  3  . pravastatin (PRAVACHOL) 80 MG tablet    Sig: Take 1 tablet (80 mg total) by mouth daily.    Dispense:  90 tablet    Refill:  1  . montelukast (SINGULAIR) 10 MG tablet    Sig: Take 1 tablet (10 mg total) by mouth at bedtime.    Dispense:  90 tablet    Refill:  1  . sertraline (ZOLOFT) 100 MG tablet    Sig: Take 2 tablets (200 mg total) by mouth daily. Start with one per day for initial 2 weeks, then increase to 2 per day.    Dispense:  180 tablet    Refill:  1  . liraglutide (VICTOZA) 18 MG/3ML SOPN    Sig: Inject 0.3 mLs (1.8 mg total) into the skin daily. I    Dispense:  3 mL    Refill:  5  . glimepiride (AMARYL) 4 MG tablet    Sig: Take 1 tablet (4 mg total) by mouth 2 (two) times daily.    Dispense:  180 tablet    Refill:  0    Ok to place on hold  if needed.   Patient Instructions       If you have lab work done today you will be contacted with your lab results within the next 2 weeks.  If you have not heard from [place] then please contact us. The fastest way to get your results is to register for My Chart.   IF you received an x-ray today, you will receive an invoice from [facility]. Please contact [facility] at [phone] with questions or concerns regarding your invoice.   IF you received labwork today, you will receive an invoice from [place]. Please contact [facility] at [phone] with questions or concerns regarding your invoice.   Our billing staff will not be able to assist you with questions regarding bills from these companies.  You will be contacted with the lab results as soon as they are available. The fastest way to get your results is to activate your My Chart account. Instructions are located on the last  page of this paperwork. If you have not heard from [place] regarding the results in 2 weeks, please contact this office.         Signed, Merri Ray, MD [facility]

## 2019-09-08 ENCOUNTER — Encounter: Payer: Self-pay | Admitting: Family Medicine

## 2019-09-30 ENCOUNTER — Other Ambulatory Visit: Payer: Self-pay | Admitting: Family Medicine

## 2019-09-30 DIAGNOSIS — G8929 Other chronic pain: Secondary | ICD-10-CM

## 2019-09-30 NOTE — Telephone Encounter (Signed)
Patient is requesting a refill of the following medications: Requested Prescriptions   Pending Prescriptions Disp Refills  . traMADol (ULTRAM) 50 MG tablet [Pharmacy Med Name: traMADol HCl 50 MG Oral Tablet] 120 tablet 0    Sig: TAKE 1 TABLET BY MOUTH EVERY 6 HOURS AS NEEDED    Date of patient request: 09/30/19 Last office visit: 09/07/19  (Chronic Knee pain) Date of last refill: 09/01/19  Last refill amount: 120 Follow up time period per chart: No f/u scheduled

## 2019-09-30 NOTE — Telephone Encounter (Signed)
Requested medication (s) are due for refill today: Early  Requested medication (s) are on the active medication list: Yes  Last refill:  09/01/19  Future visit scheduled: No  Notes to clinic:  See request.    Requested Prescriptions  Pending Prescriptions Disp Refills   traMADol (ULTRAM) 50 MG tablet [Pharmacy Med Name: traMADol HCl 50 MG Oral Tablet] 120 tablet 0    Sig: TAKE 1 TABLET BY MOUTH EVERY 6 HOURS AS NEEDED      Not Delegated - Analgesics:  Opioid Agonists Failed - 09/30/2019 10:39 AM      Failed - This refill cannot be delegated      Failed - Urine Drug Screen completed in last 360 days.      Passed - Valid encounter within last 6 months    Recent Outpatient Visits           3 weeks ago Type 2 diabetes mellitus with diabetic polyneuropathy, without long-term current use of insulin [facility])   [facility], Ranell Patrick, MD   4 months ago Type 2 diabetes mellitus with diabetic polyneuropathy, without long-term current use of insulin [facility])   [facility], Ranell Patrick, MD   7 months ago Medicare annual wellness visit, subsequent   [facility], Ranell Patrick, MD   7 months ago Type 2 diabetes mellitus with diabetic polyneuropathy, without long-term current use of insulin [facility])   [facility], Ranell Patrick, MD   11 months ago Chronic pain of both knees   [facility], Ranell Patrick, MD

## 2019-10-03 NOTE — Telephone Encounter (Signed)
Plan discussed at March 8 office visit.Controlled substance database (PDMP) reviewed. No concerns appreciated.  Last filled on March 2.  Due for refill.  Plans to meet with Ortho to determine surgical treatment plans.  Refill placed.

## 2019-11-02 ENCOUNTER — Ambulatory Visit: Payer: Self-pay

## 2019-11-02 ENCOUNTER — Other Ambulatory Visit: Payer: Self-pay | Admitting: Family Medicine

## 2019-11-02 ENCOUNTER — Encounter: Payer: Self-pay | Admitting: Orthopaedic Surgery

## 2019-11-02 ENCOUNTER — Ambulatory Visit: Payer: Medicare Other | Admitting: Orthopaedic Surgery

## 2019-11-02 ENCOUNTER — Other Ambulatory Visit: Payer: Self-pay

## 2019-11-02 DIAGNOSIS — M1712 Unilateral primary osteoarthritis, left knee: Secondary | ICD-10-CM

## 2019-11-02 DIAGNOSIS — M25561 Pain in right knee: Secondary | ICD-10-CM | POA: Diagnosis not present

## 2019-11-02 DIAGNOSIS — M25562 Pain in left knee: Secondary | ICD-10-CM | POA: Diagnosis not present

## 2019-11-02 DIAGNOSIS — M1711 Unilateral primary osteoarthritis, right knee: Secondary | ICD-10-CM

## 2019-11-02 DIAGNOSIS — G8929 Other chronic pain: Secondary | ICD-10-CM

## 2019-11-02 MED ORDER — HYDROCODONE-ACETAMINOPHEN 5-325 MG PO TABS: 1.0000 | ORAL_TABLET | Freq: Four times a day (QID) | ORAL | 0 refills | Status: DC | PRN

## 2019-11-02 NOTE — Telephone Encounter (Signed)
I printed this by accident is it ok to refill this medication?

## 2019-11-02 NOTE — Progress Notes (Signed)
Office Visit Note   Patient: Daniel [name]           Date of Birth: 03-13-1951           MRN: [id number] Visit Date: 11/02/2019              Requested by: Wendie Agreste, MD [address] [place],  [place] [address] PCP: Wendie Agreste, MD   Assessment & Plan: Visit Diagnoses:  1. Left knee pain, unspecified chronicity   2. Right knee pain, unspecified chronicity   3. Unilateral primary osteoarthritis, left knee   4. Unilateral primary osteoarthritis, right knee     Plan: At this point my recommendation would be activity modification and weight loss as well as quad strengthening exercises.  He understands that we cannot proceed with knee replacement surgery until his hemoglobin A1c is below 8.  It really needs to be at 7.5 or below before we are allowed to replace the joint.  He will work aggressively on this.  He understands why we cannot proceed with surgery safely as of yet.  I would like to see him back in 3 months for repeat exam and to assess what his A1c is at that standpoint.  All questions and concerns were answered and addressed.  Follow-Up Instructions: Return in about 3 months (around 02/02/2020).   Orders:  Orders Placed This Encounter  Procedures  . XR Knee 1-2 Views Left  . XR Knee 1-2 Views Right   Meds ordered this encounter  Medications  . HYDROcodone-acetaminophen (NORCO/VICODIN) 5-325 MG tablet    Sig: Take 1 tablet by mouth every 6 (six) hours as needed for moderate pain.    Dispense:  30 tablet    Refill:  0      Procedures: No procedures performed   Clinical Data: No additional findings.   Subjective: Chief Complaint  Patient presents with  . Left Knee - Pain  . Right Knee - Pain  The patient is a very pleasant and active 69 year old gentleman who comes to see me for the first time for both his knees.  I have actually seen his wife recently.  He does have a remote history of a right knee arthroscopy done by one of my colleagues in town.   He has been told that he has very old knees.  He has been told that for many years now.  His left knee is worse than his right.  He played high school football.  He has had multiple steroid injections in both knees as well as hyaluronic acid.  He says at this point that has not helped.  He does use a cane with ambulation.  He does report being a diabetic.  His hemoglobin A1c just 1 month ago was 8.3.  He is working on trying to get that down.  He does see his primary care physician for repeat A1c in about 2 months.  HPI  Review of Systems He currently denies any headache, chest pain, shortness of breath, fever, chills, nausea, vomiting  Objective: Vital Signs: There were no vitals taken for this visit.  Physical Exam He is alert and orient x3 and in no acute distress Ortho Exam Both knees are examined and show varus malalignment.  Both knees have significant medial joint line tenderness as well as patellofemoral crepitation.  Both knees are ligamentously stable but have a significant painful arc of motion. Specialty Comments:  No specialty comments available.  Imaging: XR Knee 1-2 Views Left  Result Date: 11/02/2019 2 views of the left knee show severe end-stage arthritis with varus malalignment.  The medial joint space is completely lost with bone-on-bone wear.  There are osteophytes in all 3 compartments.  XR Knee 1-2 Views Right  Result Date: 11/02/2019 2 views of the right knee show severe tricompartment arthritic changes with significant medial joint line narrowing to almost complete bone-on-bone wear on the medial side.  There is varus malalignment.  There are large osteophytes    PMFS History: Patient Active Problem List   Diagnosis Date Noted  . Unilateral primary osteoarthritis, left knee 11/02/2019  . Unilateral primary osteoarthritis, right knee 11/02/2019  . Carpal tunnel syndrome, right upper limb 02/18/2018  . Anxiety and depression 10/28/2016  . Gastroesophageal reflux  disease without esophagitis 10/28/2016  . Seasonal allergic rhinitis due to pollen 10/28/2016  . Primary osteoarthritis of left knee 07/25/2015  . Essential hypertension 07/25/2015  . Hypercholesteremia 07/25/2015  . Type 2 diabetes mellitus with diabetic polyneuropathy, without long-term current use of insulin ([place]) 07/25/2015   Past Medical History:  Diagnosis Date  . Allergic rhinitis   . Allergy   . Anxiety   . Arthritis   . Benign hypertension   . Cataract   . Depression   . Diabetes mellitus ([place])   . GERD (gastroesophageal reflux disease)   . History of pneumonia   . HLD (hyperlipidemia)   . Neuromuscular disorder ([place])     Family History  Problem Relation Age of Onset  . Diabetes Father   . Colon polyps Father   . Atrial fibrillation Mother   . Prostate cancer Paternal Uncle   . Cancer Sister   . Esophageal cancer Neg Hx   . Rectal cancer Neg Hx   . Stomach cancer Neg Hx     Past Surgical History:  Procedure Laterality Date  . CATARACT EXTRACTION     bilateral  . EYE SURGERY    . KNEE SURGERY     right   Social History   Occupational History  . Occupation: Dealer, Company secretary  Tobacco Use  . Smoking status: Never Smoker  . Smokeless tobacco: Never Used  Substance and Sexual Activity  . Alcohol use: No  . Drug use: No  . Sexual activity: Not Currently

## 2019-11-03 MED ORDER — TRAMADOL HCL 50 MG PO TABS: 50.0000 mg | ORAL_TABLET | Freq: Four times a day (QID) | ORAL | 0 refills | Status: DC | PRN

## 2019-11-03 NOTE — Telephone Encounter (Signed)
Controlled substance database (PDMP) reviewed. No concerns appreciated.  Last filled 10/03/19. Office visit 09/07/19 Refill placed.

## 2019-11-03 NOTE — Addendum Note (Signed)
Addended by: Merri Ray R on: 11/03/2019 10:12 AM   Modules accepted: Orders

## 2019-11-24 ENCOUNTER — Other Ambulatory Visit: Payer: Self-pay

## 2019-11-24 ENCOUNTER — Encounter: Payer: Self-pay | Admitting: Orthopaedic Surgery

## 2019-11-24 ENCOUNTER — Ambulatory Visit: Payer: Medicare Other | Admitting: Orthopaedic Surgery

## 2019-11-24 DIAGNOSIS — G5602 Carpal tunnel syndrome, left upper limb: Secondary | ICD-10-CM | POA: Diagnosis not present

## 2019-11-24 NOTE — Progress Notes (Signed)
Office Visit Note   Patient: Daniel [name]           Date of Birth: 01-31-1951           MRN: [id number] Visit Date: 11/24/2019              Requested by: Wendie Agreste, MD [address] [place],   [address] PCP: Wendie Agreste, MD   Assessment & Plan: Visit Diagnoses:  1. Carpal tunnel syndrome, left upper limb     Plan: Left carpal tunnel syndrome.  Mr. Bonno has had successful right carpal tunnel release and wants to proceed with surgery on the left hand.  He had EMGs and nerve conduction studies performed in September 2019 demonstrating severe bilateral right worse than left median nerve entrapment at the wrist.  Because of Covid he wanted to wait to perform the surgery but now that he has been vaccinated he like to proceed.  Follow-Up Instructions: Return We will schedule left carpal tunnel surgery.   Orders:  No orders of the defined types were placed in this encounter.  No orders of the defined types were placed in this encounter.     Procedures: No procedures performed   Clinical Data: No additional findings.   Subjective: Chief Complaint  Patient presents with  . Left Hand - Pain  Patient presents today for left hand pain. He said that he has carpal tunnel and wants to have it surgically corrected. He has numbness is in all fingers, but worse in his thumb, index, and middle fingers. He has no grip in his left hand. He takes Tramadol daily. He had right carpal tunnel release in February of 2020 and was unable to get his left side done due to Covid 19. He had an EMG done with Dr.Newton in September of 2019.   Continues to drop objects and having some decreased sensibility in the radial 3 digits of his left hand.  He is diabetic with a hemoglobin A1c in the mid sevens he does have diabetic neuropathy in both of his feet and takes gabapentin.  HPI  Review of Systems  Constitutional: Negative for fatigue.  HENT: Negative for ear pain.   Eyes:  Negative for pain.  Respiratory: Negative for shortness of breath.   Cardiovascular: Negative for leg swelling.  Gastrointestinal: Negative for constipation and diarrhea.  Endocrine: Negative for cold intolerance and heat intolerance.  Genitourinary: Negative for difficulty urinating.  Musculoskeletal: Positive for joint swelling.  Skin: Negative for rash.  Allergic/Immunologic: Negative for food allergies.  Neurological: Positive for weakness.  Hematological: Does not bruise/bleed easily.  Psychiatric/Behavioral: Positive for sleep disturbance.     Objective: Vital Signs: Ht 5\' 8"  (1.727 m)   Wt 234 lb (106.1 kg)   BMI 35.58 kg/m   Physical Exam Constitutional:      Appearance: He is well-developed.  Eyes:     Pupils: Pupils are equal, round, and reactive to light.  Pulmonary:     Effort: Pulmonary effort is normal.  Skin:    General: Skin is warm and dry.  Neurological:     Mental Status: He is alert and oriented to person, place, and time.  Psychiatric:        Behavior: Behavior normal.     Ortho Exam left wrist with positive Phalen's and Tinel's with some slight decreased sensibility in the radial 3 digits.  Some atrophy of the thenar musculature.  Is able to oppose thumb to little finger.  Full extension  across the metacarpal phalangeal joints.  No swelling of the digits.  Specialty Comments:  No specialty comments available.  Imaging: No results found.   PMFS History: Patient Active Problem List   Diagnosis Date Noted  . Carpal tunnel syndrome, left upper limb 11/24/2019  . Unilateral primary osteoarthritis, left knee 11/02/2019  . Unilateral primary osteoarthritis, right knee 11/02/2019  . Carpal tunnel syndrome, right upper limb 02/18/2018  . Anxiety and depression 10/28/2016  . Gastroesophageal reflux disease without esophagitis 10/28/2016  . Seasonal allergic rhinitis due to pollen 10/28/2016  . Primary osteoarthritis of left knee 07/25/2015  .  Essential hypertension 07/25/2015  . Hypercholesteremia 07/25/2015  . Type 2 diabetes mellitus with diabetic polyneuropathy, without long-term current use of insulin ([place]) 07/25/2015   Past Medical History:  Diagnosis Date  . Allergic rhinitis   . Allergy   . Anxiety   . Arthritis   . Benign hypertension   . Cataract   . Depression   . Diabetes mellitus ([place])   . GERD (gastroesophageal reflux disease)   . History of pneumonia   . HLD (hyperlipidemia)   . Neuromuscular disorder ([place])     Family History  Problem Relation Age of Onset  . Diabetes Father   . Colon polyps Father   . Atrial fibrillation Mother   . Prostate cancer Paternal Uncle   . Cancer Sister   . Esophageal cancer Neg Hx   . Rectal cancer Neg Hx   . Stomach cancer Neg Hx     Past Surgical History:  Procedure Laterality Date  . CATARACT EXTRACTION     bilateral  . EYE SURGERY    . KNEE SURGERY     right   Social History   Occupational History  . Occupation: Dealer, Company secretary  Tobacco Use  . Smoking status: Never Smoker  . Smokeless tobacco: Never Used  Substance and Sexual Activity  . Alcohol use: No  . Drug use: No  . Sexual activity: Not Currently

## 2019-12-01 ENCOUNTER — Other Ambulatory Visit: Payer: Self-pay | Admitting: Family Medicine

## 2019-12-01 DIAGNOSIS — M25562 Pain in left knee: Secondary | ICD-10-CM

## 2019-12-01 DIAGNOSIS — G8929 Other chronic pain: Secondary | ICD-10-CM

## 2019-12-01 NOTE — Telephone Encounter (Signed)
    Patient is requesting a refill of the following medications: Requested Prescriptions   Pending Prescriptions Disp Refills  . traMADol (ULTRAM) 50 MG tablet [Pharmacy Med Name: traMADol HCl 50 MG Oral Tablet] 120 tablet 0    Sig: TAKE 1 TABLET BY MOUTH EVERY 6 HOURS AS NEEDED    Date of patient request: 12/01/19 Last office visit: 09/07/19 for DM & HTN Date of last refill: 11/03/19 Last refill amount: 120 Follow up time period per chart: no f/u with [place] scheduled. Has appt for [facility]

## 2019-12-01 NOTE — Telephone Encounter (Signed)
Requested medication (s) are due for refill today: yes  Requested medication (s) are on the active medication list: yes  Last refill:  11/03/19  Future visit scheduled: no  Notes to clinic:  not delegated   Requested Prescriptions  Pending Prescriptions Disp Refills   traMADol (ULTRAM) 50 MG tablet [Pharmacy Med Name: traMADol HCl 50 MG Oral Tablet] 120 tablet 0    Sig: TAKE 1 TABLET BY MOUTH EVERY 6 HOURS AS NEEDED      Not Delegated - Analgesics:  Opioid Agonists Failed - 12/01/2019 10:20 AM      Failed - This refill cannot be delegated      Failed - Urine Drug Screen completed in last 360 days.      Passed - Valid encounter within last 6 months    Recent Outpatient Visits           2 months ago Type 2 diabetes mellitus with diabetic polyneuropathy, without long-term current use of insulin ([place])   [facility], Ranell Patrick, MD   6 months ago Type 2 diabetes mellitus with diabetic polyneuropathy, without long-term current use of insulin [facility])   [facility], Ranell Patrick, MD   9 months ago Medicare annual wellness visit, subsequent   [facility], Ranell Patrick, MD   9 months ago Type 2 diabetes mellitus with diabetic polyneuropathy, without long-term current use of insulin [facility])   [facility], Ranell Patrick, MD   1 year ago Chronic pain of both knees   [facility], Ranell Patrick, MD       Future Appointments             In 1 week Petrarca, Mike Craze, [place]   In 2 months Mcarthur Rossetti, MD [facility]

## 2019-12-01 NOTE — Telephone Encounter (Signed)
Medication discussed March 8.  Orthopedic evaluation in May reviewed with plan for decrease in A1c to work towards surgery/knee replacement.  Controlled substance database (PDMP) reviewed. No concerns appreciated.  Last filled May 4, new refill ordered.

## 2019-12-03 ENCOUNTER — Other Ambulatory Visit: Payer: Self-pay | Admitting: Orthopedic Surgery

## 2019-12-03 DIAGNOSIS — G5602 Carpal tunnel syndrome, left upper limb: Secondary | ICD-10-CM | POA: Diagnosis not present

## 2019-12-03 MED ORDER — HYDROCODONE-ACETAMINOPHEN 5-325 MG PO TABS: 1.0000 | ORAL_TABLET | Freq: Four times a day (QID) | ORAL | 0 refills | Status: DC | PRN

## 2019-12-10 ENCOUNTER — Ambulatory Visit (INDEPENDENT_AMBULATORY_CARE_PROVIDER_SITE_OTHER): Payer: Medicare Other | Admitting: Orthopedic Surgery

## 2019-12-10 ENCOUNTER — Encounter: Payer: Self-pay | Admitting: Orthopedic Surgery

## 2019-12-10 ENCOUNTER — Other Ambulatory Visit: Payer: Self-pay

## 2019-12-10 ENCOUNTER — Other Ambulatory Visit: Payer: Self-pay | Admitting: Family Medicine

## 2019-12-10 VITALS — Ht 68.0 in | Wt 234.0 lb

## 2019-12-10 DIAGNOSIS — G5602 Carpal tunnel syndrome, left upper limb: Secondary | ICD-10-CM

## 2019-12-10 DIAGNOSIS — E1142 Type 2 diabetes mellitus with diabetic polyneuropathy: Secondary | ICD-10-CM

## 2019-12-10 NOTE — Progress Notes (Signed)
Office Visit Note   Patient: Daniel [name]           Date of Birth: [birth year]           MRN: [id number] Visit Date: 12/10/2019              Requested by: Wendie Agreste, MD [address] [place],  [place] [address] PCP: Wendie Agreste, MD   Assessment & Plan: Visit Diagnoses:  1. Carpal tunnel syndrome, left upper limb     Plan:  #1: 1/2 of the sutures were removed without difficulty.  Wound remained closed #2: Those areas were Steri-Stripped #3: Large Band-Aid placed to the palm #4: Continue to use his wrist immobilizer  Follow-Up Instructions: Follow-up 1 week for the remaining sutures to be removed  Orders:  No orders of the defined types were placed in this encounter.  No orders of the defined types were placed in this encounter.     Procedures: No procedures performed   Clinical Data: No additional findings.   Subjective: Chief Complaint  Patient presents with  . Left Wrist - Follow-up    Left carpal tunnel release DOS 12-03-2019  Patient presents today for follow up on his left wrist. He had carpal tunnel release on the left side on 12-03-2019. He is now one week out from surgery. Doing well. No complaints.  States his symptoms are certainly improved  HPI  Review of Systems   Objective: Vital Signs: Ht 5\' 8"  (1.727 m)   Wt 234 lb (106.1 kg)   BMI 35.58 kg/m   Physical Exam  Ortho Exam  Exam today reveals the wound to be healing per primam with no signs of infection.  Every other suture was removed and Steri-Strips were placed across those.  Regular dressing was placed.  He has ability to almost make a full fist.  Good sensation to light touch.  Specialty Comments:  No specialty comments available.  Imaging: No results found.   PMFS History: Patient Active Problem List   Diagnosis Date Noted  . Carpal tunnel syndrome, left upper limb 11/24/2019  . Unilateral primary osteoarthritis, left knee 11/02/2019  . Unilateral primary  osteoarthritis, right knee 11/02/2019  . Carpal tunnel syndrome, right upper limb 02/18/2018  . Anxiety and depression 10/28/2016  . Gastroesophageal reflux disease without esophagitis 10/28/2016  . Seasonal allergic rhinitis due to pollen 10/28/2016  . Primary osteoarthritis of left knee 07/25/2015  . Essential hypertension 07/25/2015  . Hypercholesteremia 07/25/2015  . Type 2 diabetes mellitus with diabetic polyneuropathy, without long-term current use of insulin ([place]) 07/25/2015   Past Medical History:  Diagnosis Date  . Allergic rhinitis   . Allergy   . Anxiety   . Arthritis   . Benign hypertension   . Cataract   . Depression   . Diabetes mellitus ([place])   . GERD (gastroesophageal reflux disease)   . History of pneumonia   . HLD (hyperlipidemia)   . Neuromuscular disorder ([place])     Family History  Problem Relation Age of Onset  . Diabetes Father   . Colon polyps Father   . Atrial fibrillation Mother   . Prostate cancer Paternal Uncle   . Cancer Sister   . Esophageal cancer Neg Hx   . Rectal cancer Neg Hx   . Stomach cancer Neg Hx     Past Surgical History:  Procedure Laterality Date  . CATARACT EXTRACTION     bilateral  . EYE SURGERY    .  KNEE SURGERY     right   Social History   Occupational History  . Occupation: Dealer, Company secretary  Tobacco Use  . Smoking status: Never Smoker  . Smokeless tobacco: Never Used  Vaping Use  . Vaping Use: Never used  Substance and Sexual Activity  . Alcohol use: No  . Drug use: No  . Sexual activity: Not Currently

## 2019-12-17 ENCOUNTER — Encounter: Payer: Self-pay | Admitting: Orthopaedic Surgery

## 2019-12-17 ENCOUNTER — Other Ambulatory Visit: Payer: Self-pay

## 2019-12-17 ENCOUNTER — Ambulatory Visit: Payer: Medicare Other | Admitting: Orthopaedic Surgery

## 2019-12-17 VITALS — Ht 68.0 in | Wt 234.0 lb

## 2019-12-17 DIAGNOSIS — G5602 Carpal tunnel syndrome, left upper limb: Secondary | ICD-10-CM

## 2019-12-17 NOTE — Progress Notes (Signed)
Office Visit Note   Patient: Daniel [name]           Date of Birth: August 27, 1950           MRN: [id number] Visit Date: 12/17/2019              Requested by: Wendie Agreste, MD [address] [place],  [place] [address] PCP: Wendie Agreste, MD   Assessment & Plan: Visit Diagnoses:  1. Carpal tunnel syndrome, left upper limb     Plan: 2 weeks status post left carpal tunnel release and doing very well.  Not taking any pain medicines and notes that his preoperative symptoms have resolved except for some slight tingling at the very tips of his radial 3 digits.  I remove the remainder of the stitches and applied Steri-Strips over benzoin.  He will wear the splint to protect the incision and return to see me in 2 weeks  Follow-Up Instructions: Return in about 2 weeks (around 12/31/2019).   Orders:  No orders of the defined types were placed in this encounter.  No orders of the defined types were placed in this encounter.     Procedures: No notes on file   Clinical Data: No additional findings.   Subjective: Chief Complaint  Patient presents with  . Left Wrist - Follow-up    Left carpal tunnel release DOS 12-03-3019  Patient presents today for follow up on his left wrist. He had left carpal tunnel release on 12-03-2019. He is now two weeks out from surgery. He is doing well. No complaints. HPI  Review of Systems   Objective: Vital Signs: Ht 5\' 8"  (1.727 m)   Wt 234 lb (106.1 kg)   BMI 35.58 kg/m   Physical Exam  Ortho Exam left carpal tunnel incision healing without problem.  Very minimal induration.  Normal opposition of thumb to little finger.  Able to make a full fist.  Normal capillary refill to fingers and good sensibility to the radial 3 digits  Specialty Comments:  No specialty comments available.  Imaging: No results found.   PMFS History:  Patient Active Problem List   Diagnosis Date Noted  . Carpal tunnel syndrome, left upper limb 11/24/2019  .  Unilateral primary osteoarthritis, left knee 11/02/2019  . Unilateral primary osteoarthritis, right knee 11/02/2019  . Carpal tunnel syndrome, right upper limb 02/18/2018  . Anxiety and depression 10/28/2016  . Gastroesophageal reflux disease without esophagitis 10/28/2016  . Seasonal allergic rhinitis due to pollen 10/28/2016  . Primary osteoarthritis of left knee 07/25/2015  . Essential hypertension 07/25/2015  . Hypercholesteremia 07/25/2015  . Type 2 diabetes mellitus with diabetic polyneuropathy, without long-term current use of insulin ([place]) 07/25/2015   Past Medical History:  Diagnosis Date  . Allergic rhinitis   . Allergy   . Anxiety   . Arthritis   . Benign hypertension   . Cataract   . Depression   . Diabetes mellitus ([place])   . GERD (gastroesophageal reflux disease)   . History of pneumonia   . HLD (hyperlipidemia)   . Neuromuscular disorder ([place])     Family History  Problem Relation Age of Onset  . Diabetes Father   . Colon polyps Father   . Atrial fibrillation Mother   . Prostate cancer Paternal Uncle   . Cancer Sister   . Esophageal cancer Neg Hx   . Rectal cancer Neg Hx   . Stomach cancer Neg Hx     Past Surgical History:  Procedure Laterality Date  . CATARACT EXTRACTION     bilateral  . EYE SURGERY    . KNEE SURGERY     right   Social History   Occupational History  . Occupation: Dealer, Company secretary  Tobacco Use  . Smoking status: Never Smoker  . Smokeless tobacco: Never Used  Vaping Use  . Vaping Use: Never used  Substance and Sexual Activity  . Alcohol use: No  . Drug use: No  . Sexual activity: Not Currently

## 2019-12-31 ENCOUNTER — Other Ambulatory Visit: Payer: Self-pay

## 2019-12-31 ENCOUNTER — Ambulatory Visit: Payer: Medicare Other | Admitting: Orthopaedic Surgery

## 2019-12-31 ENCOUNTER — Encounter: Payer: Self-pay | Admitting: Orthopaedic Surgery

## 2019-12-31 DIAGNOSIS — G5602 Carpal tunnel syndrome, left upper limb: Secondary | ICD-10-CM

## 2019-12-31 NOTE — Progress Notes (Signed)
Office Visit Note   Patient: Daniel [name]           Date of Birth: [birth year]           MRN: [id number] Visit Date: 12/31/2019              Requested by: Wendie Agreste, MD [address] [place],  [place] [address] PCP: Wendie Agreste, MD   Assessment & Plan: Visit Diagnoses:  1. Carpal tunnel syndrome, left upper limb     Plan: 1 month status post left carpal tunnel release and doing quite well.  Preoperative EMGs and nerve conduction studies demonstrated a severe median nerve compression with the possibility of incomplete relief even after decompression.  He is diabetic and had some evidence of underlying peripheral neuropathy as well.  Despite the above he notes he is doing quite well and is now able to feel "objects a lot better".  Is not dropping anything or any sleeping through the night.  Discussed activity modification like to check him 1 more time in a month Follow-Up Instructions: Return in about 1 month (around 01/31/2020).   Orders:  No orders of the defined types were placed in this encounter.  No orders of the defined types were placed in this encounter.     Procedures: No procedures performed   Clinical Data: No additional findings.   Subjective: Chief Complaint  Patient presents with  . Left Wrist - Routine Post Op  No related problems 1 month status post left carpal tunnel release.  Has improved feeling to the radial 3 digits, sleeps through the night and does not have any issues with dropping objects that he had preoperatively  HPI  Review of Systems   Objective: Vital Signs: There were no vitals taken for this visit.  Physical Exam  Ortho Exam left carpal tunnel incision healing without problem.  Very minimal duration.  Minimal tenderness.  Full range of motion of his fingers.  Very minimal decreased sensibility to the tips of the radial 3 digits but better than it was even 2 weeks ago  Specialty Comments:  No specialty comments  available.  Imaging: No results found.   PMFS History: Patient Active Problem List   Diagnosis Date Noted  . Carpal tunnel syndrome, left upper limb 11/24/2019  . Unilateral primary osteoarthritis, left knee 11/02/2019  . Unilateral primary osteoarthritis, right knee 11/02/2019  . Carpal tunnel syndrome, right upper limb 02/18/2018  . Anxiety and depression 10/28/2016  . Gastroesophageal reflux disease without esophagitis 10/28/2016  . Seasonal allergic rhinitis due to pollen 10/28/2016  . Primary osteoarthritis of left knee 07/25/2015  . Essential hypertension 07/25/2015  . Hypercholesteremia 07/25/2015  . Type 2 diabetes mellitus with diabetic polyneuropathy, without long-term current use of insulin ([place]) 07/25/2015   Past Medical History:  Diagnosis Date  . Allergic rhinitis   . Allergy   . Anxiety   . Arthritis   . Benign hypertension   . Cataract   . Depression   . Diabetes mellitus ([place])   . GERD (gastroesophageal reflux disease)   . History of pneumonia   . HLD (hyperlipidemia)   . Neuromuscular disorder ([place])     Family History  Problem Relation Age of Onset  . Diabetes Father   . Colon polyps Father   . Atrial fibrillation Mother   . Prostate cancer Paternal Uncle   . Cancer Sister   . Esophageal cancer Neg Hx   . Rectal cancer Neg Hx   .  Stomach cancer Neg Hx     Past Surgical History:  Procedure Laterality Date  . CATARACT EXTRACTION     bilateral  . EYE SURGERY    . KNEE SURGERY     right   Social History   Occupational History  . Occupation: Dealer, Company secretary  Tobacco Use  . Smoking status: Never Smoker  . Smokeless tobacco: Never Used  Vaping Use  . Vaping Use: Never used  Substance and Sexual Activity  . Alcohol use: No  . Drug use: No  . Sexual activity: Not Currently     Garald Balding, MD   Note - This record has been created using [employer].  Chart creation errors have been sought, but may not always  have  been located. Such creation errors do not reflect on  the standard of medical care.

## 2020-01-04 ENCOUNTER — Other Ambulatory Visit: Payer: Self-pay | Admitting: Family Medicine

## 2020-01-04 DIAGNOSIS — G8929 Other chronic pain: Secondary | ICD-10-CM

## 2020-01-05 NOTE — Telephone Encounter (Signed)
Patient is requesting a refill of the following medications: Requested Prescriptions   Pending Prescriptions Disp Refills  . traMADol (ULTRAM) 50 MG tablet [Pharmacy Med Name: traMADol HCl 50 MG Oral Tablet] 120 tablet 0    Sig: TAKE 1 TABLET BY MOUTH EVERY 6 HOURS AS NEEDED    Date of patient request: 01/04/20  Last office visit: 09/07/19 Date of last refill: 12/01/19 Last refill amount: 120 + 0 Follow up time period per chart: 01/25/20

## 2020-01-05 NOTE — Telephone Encounter (Signed)
Requested medication (s) are due for refill today: yes  Requested medication (s) are on the active medication list: yes  Last refill:  12/01/2019  Future visit scheduled: yes  Notes to clinic: this refill cannot be delegated    Requested Prescriptions  Pending Prescriptions Disp Refills   traMADol (ULTRAM) 50 MG tablet [Pharmacy Med Name: traMADol HCl 50 MG Oral Tablet] 120 tablet 0    Sig: TAKE 1 TABLET BY MOUTH EVERY 6 HOURS AS NEEDED      Not Delegated - Analgesics:  Opioid Agonists Failed - 01/04/2020  3:40 PM      Failed - This refill cannot be delegated      Failed - Urine Drug Screen completed in last 360 days.      Passed - Valid encounter within last 6 months    Recent Outpatient Visits           4 months ago Type 2 diabetes mellitus with diabetic polyneuropathy, without long-term current use of insulin ([place])   [facility], Ranell Patrick, MD   7 months ago Type 2 diabetes mellitus with diabetic polyneuropathy, without long-term current use of insulin [facility])   [facility], Ranell Patrick, MD   10 months ago Medicare annual wellness visit, subsequent   [facility], Ranell Patrick, MD   10 months ago Type 2 diabetes mellitus with diabetic polyneuropathy, without long-term current use of insulin [facility])   [facility], Ranell Patrick, MD   1 year ago Chronic pain of both knees   [facility], Ranell Patrick, MD       Future Appointments             In 2 weeks Carlota Raspberry Ranell Patrick, MD [facility], [facility]   In 4 weeks Mcarthur Rossetti, MD [facility]   In 1 month Durward Fortes Vonna Kotyk, MD [facility]

## 2020-01-07 NOTE — Telephone Encounter (Signed)
Controlled substance database (PDMP) reviewed. No concerns appreciated.  Last filled 12/01/2019.  Refill ordered.

## 2020-01-25 ENCOUNTER — Other Ambulatory Visit: Payer: Self-pay

## 2020-01-25 ENCOUNTER — Encounter: Payer: Self-pay | Admitting: Family Medicine

## 2020-01-25 ENCOUNTER — Ambulatory Visit (INDEPENDENT_AMBULATORY_CARE_PROVIDER_SITE_OTHER): Payer: Medicare Other | Admitting: Family Medicine

## 2020-01-25 VITALS — BP 136/78 | HR 75 | Temp 97.8°F | Resp 15 | Ht 67.0 in | Wt 239.2 lb

## 2020-01-25 DIAGNOSIS — Z0000 Encounter for general adult medical examination without abnormal findings: Secondary | ICD-10-CM

## 2020-01-25 DIAGNOSIS — M25561 Pain in right knee: Secondary | ICD-10-CM | POA: Diagnosis not present

## 2020-01-25 DIAGNOSIS — I10 Essential (primary) hypertension: Secondary | ICD-10-CM

## 2020-01-25 DIAGNOSIS — K5900 Constipation, unspecified: Secondary | ICD-10-CM

## 2020-01-25 DIAGNOSIS — M25562 Pain in left knee: Secondary | ICD-10-CM

## 2020-01-25 DIAGNOSIS — Z0001 Encounter for general adult medical examination with abnormal findings: Secondary | ICD-10-CM | POA: Diagnosis not present

## 2020-01-25 DIAGNOSIS — E7800 Pure hypercholesterolemia, unspecified: Secondary | ICD-10-CM | POA: Diagnosis not present

## 2020-01-25 DIAGNOSIS — E1142 Type 2 diabetes mellitus with diabetic polyneuropathy: Secondary | ICD-10-CM | POA: Diagnosis not present

## 2020-01-25 DIAGNOSIS — G8929 Other chronic pain: Secondary | ICD-10-CM

## 2020-01-25 DIAGNOSIS — H9190 Unspecified hearing loss, unspecified ear: Secondary | ICD-10-CM

## 2020-01-25 LAB — POCT GLYCOSYLATED HEMOGLOBIN (HGB A1C): Hemoglobin A1C: 7.4 % — AB (ref 4.0–5.6)

## 2020-01-25 MED ORDER — GABAPENTIN 300 MG PO CAPS: 300.0000 mg | ORAL_CAPSULE | Freq: Two times a day (BID) | ORAL | 3 refills | Status: DC | PRN

## 2020-01-25 MED ORDER — GLIMEPIRIDE 4 MG PO TABS: 4.0000 mg | ORAL_TABLET | Freq: Two times a day (BID) | ORAL | 0 refills | Status: DC

## 2020-01-25 MED ORDER — LIRAGLUTIDE 18 MG/3ML ~~LOC~~ SOPN: 1.8000 mg | PEN_INJECTOR | Freq: Every day | SUBCUTANEOUS | 5 refills | Status: DC

## 2020-01-25 NOTE — Patient Instructions (Addendum)
For constipation: Continue vegetables and fiber in diet. Can add metamucil or citrucel daily. Ok to continue miralax daily if needed for now.   Great news with lower A1c. No new med changes for now.  To help prevent low blood sugars make sure to not skip meals. If you have a smaller meal than usual, then do not take glimepiride with that meal. If return of low blood sugars, schedule appointment to change meds.  Return to the clinic or go to the nearest emergency room if any of your symptoms worsen or new symptoms occur.  If home blood pressure remains above 140/90, return to discuss med changes.   I do recommend audiogram/hearing test. I will refer you for testing.   Return to the clinic or go to the nearest emergency room if any of your symptoms worsen or new symptoms occur.   Constipation, Adult Constipation is when a person has fewer bowel movements in a week than normal, has difficulty having a bowel movement, or has stools that are dry, hard, or larger than normal. Constipation may be caused by an underlying condition. It may become worse with age if a person takes certain medicines and does not take in enough fluids. Follow these instructions at home: Eating and drinking   Eat foods that have a lot of fiber, such as fresh fruits and vegetables, whole grains, and beans.  Limit foods that are high in fat, low in fiber, or overly processed, such as french fries, hamburgers, cookies, candies, and soda.  Drink enough fluid to keep your urine clear or pale yellow. General instructions  Exercise regularly or as told by your health care provider.  Go to the restroom when you have the urge to go. Do not hold it in.  Take over-the-counter and prescription medicines only as told by your health care provider. These include any fiber supplements.  Practice pelvic floor retraining exercises, such as deep breathing while relaxing the lower abdomen and pelvic floor relaxation during bowel  movements.  Watch your condition for any changes.  Keep all follow-up visits as told by your health care provider. This is important. Contact a health care provider if:  You have pain that gets worse.  You have a fever.  You do not have a bowel movement after 4 days.  You vomit.  You are not hungry.  You lose weight.  You are bleeding from the anus.  You have thin, pencil-like stools. Get help right away if:  You have a fever and your symptoms suddenly get worse.  You leak stool or have blood in your stool.  Your abdomen is bloated.  You have severe pain in your abdomen.  You feel dizzy or you faint. This information is not intended to replace advice given to you by your health care provider. Make sure you discuss any questions you have with your health care provider. Document Revised: 05/31/2017 Document Reviewed: 12/07/2015 Elsevier Patient Education  [employer].    If you have lab work done today you will be contacted with your lab results within the next 2 weeks.  If you have not heard from [place] then please contact us. The fastest way to get your results is to register for My Chart.   IF you received an x-ray today, you will receive an invoice from [facility]. Please contact [facility] at [phone] with questions or concerns regarding your invoice.   IF you received labwork today, you will receive an invoice from [place]. Please  contact [facility] at [phone] with questions or concerns regarding your invoice.   Our billing staff will not be able to assist you with questions regarding bills from these companies.  You will be contacted with the lab results as soon as they are available. The fastest way to get your results is to activate your My Chart account. Instructions are located on the last page of this paperwork. If you have not heard from [place] regarding the results in 2 weeks, please contact this office.

## 2020-01-25 NOTE — Progress Notes (Signed)
Subjective:  Patient ID: Daniel [name], male    DOB: 08-11-1950  Age: 69 y.o. MRN: [id number]  CC:  Chief Complaint  Patient presents with  . Annual Exam    pt has been struggling with some constipation for a almost a year but has gotten worse recently pt does report taking one dose of mirilax daily to help but has not been helping as well recently, pt also notes he has an appt for orthopedic next week would like to know his A1c number prior to that visit     HPI Daniel [name] presents for   Annual exam, other concerns as above and follow-up on medications.  Care team: PCP, me Ortho: Dr. Ninfa Linden, Dr. Betti Cruz tunnel syndrome, carpal tunnel release earlier this year).  Derm: Dr. Elvera Lennox Dr. Harrington Challenger: cardiology.  Optho: [facility].  GI: [facility].   Constipation: Reports ongoing symptoms off and for years. but worse recently past 6 months.  He is on chronic tramadol for bilateral knee arthritis.  Has been trying MiraLAX for months - most days per week - 4-5 days per week. Added dulcolax a few times in place of miralax. Does eat vegetables. Water intake: 3-4 16oz glasses water per day. Decaf tea some.  BM this am.   Chronic knee pain: Followed by orthopedist, Dr. Ninfa Linden, note reviewed from May.  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  Knee replacement has been delayed until A1c below 8, ideally below 7.5.  Activity modification, weight loss and quad strengthening exercises were discussed at May visit.  I have continued to treat pain in the interim with tramadol 50 mg every 6 hours as needed. Controlled substance database (PDMP) reviewed. No concerns appreciated.  Last filled tramadol No. 128 on July 8, previous prescription June 1, previous prescription May 4. Next appt with Dr. Ninfa Linden next Tuesday.  Still taking ultram 3 per day (1 in am, 2 pm, sometimes 2 in am if needed). Has rx of norco from ortho if needed, but not using.   Diabetes: Complicated  by hyperglycemia, polyneuropathy, microalbuminuria. Last evaluated March 8. Neuropathy symptoms treated with gabapentin, 300 mg every morning, 600 mg every afternoon. Takes Metformin 1000 mg twice daily, Victoza 1.8 mg daily, glimepiride 4 mg twice daily.  Had missed some doses of Victoza at last visit but compliant with other medications.  A1c still elevated in March, he planned some significant changes in low-carb diet. Weight has increased 3 pounds since March 8 visit 6-week follow-up for fructosamine has been discussed after March labs.  Has not had repeat testing. Missed 2 doses of victoza in 90 days. Doing better. Symptomatic low - 55, 3 weeks ago. Rare 60-72. May not have eaten enough with prior meals, or glimepiride hr or two after meal.  Home readings:  Fasting: average 112 for last 90 days.  2 hr postprandial - 160-180.  Unable to exercise d/t knees at this time.   Microalbumin: Due for updated testing, last ratio in January 2020 elevated at 70 Optho, foot exam, pneumovax: Up-to-date except for ophthalmology = appt in August, once a year. Mauri Reading.  Ophthalmology: He is on statin, ACE inhibitor.   Wt Readings from Last 3 Encounters:  01/25/20 (!) 239 lb 3.2 oz (108.5 kg)  12/17/19 234 lb (106.1 kg)  12/10/19 234 lb (106.1 kg)    Lab Results  Component Value Date   HGBA1C 7.4 (A) 01/25/2020   HGBA1C 8.3 (H) 09/07/2019   HGBA1C 8.5 (H) 05/20/2019  Lab Results  Component Value Date   MICROALBUR 15.1 05/03/2015   LDLCALC 99 02/11/2019   CREATININE 1.10 02/11/2019   Hyperlipidemia: Pravastatin 80 mg daily.  No new side effects.  Lab Results  Component Value Date   CHOL 187 02/11/2019   HDL 58 02/11/2019   LDLCALC 99 02/11/2019   TRIG 152 (H) 02/11/2019   CHOLHDL 3.2 02/11/2019   Lab Results  Component Value Date   ALT 24 02/11/2019   AST 18 02/11/2019   ALKPHOS 73 02/11/2019   BILITOT 0.2 02/11/2019    Hypertension: Lisinopril 20 mg daily.  Home  readings - 135/70- 150/80.  BP Readings from Last 3 Encounters:  01/25/20 (!) 136/78  09/07/19 (!) 144/80  05/20/19 (!) 147/83   Lab Results  Component Value Date   CREATININE 1.10 02/11/2019   Asthma, GERD, anxiety/depression were discussed in March, all were controlled at that time.  Continued on Singulair, Zyrtec, albuterol if needed but had not been using recently, omeprazole daily for heartburn, and Zoloft 200 mg daily controlling anxiety/depression.   Fall Risk  01/25/2020 09/07/2019 05/20/2019 03/04/2019 02/11/2019  Falls in the past year? 0 0 0 0 0  Number falls in past yr: 0 - 0 0 -  Injury with Fall? 0 - 0 0 1  Comment - - - - -  Risk for fall due to : No Fall Risks - - - -  Follow up Falls evaluation completed Falls evaluation completed - Falls evaluation completed;Education provided;Falls prevention discussed Falls evaluation completed    Depression screen [facility] 2/9 01/25/2020 09/07/2019 05/20/2019 03/04/2019 02/11/2019  Decreased Interest 0 0 0 0 0  Down, Depressed, Hopeless 0 0 0 0 0  PHQ - 2 Score 0 0 0 0 0  on zoloft as above - working well.   Cancer screening: Colon: 10/11/2016 colonoscopy, repeat 3-year plan as tubular adenoma noted. Plans to schedule after knee surgery if he is a candidate based on A1c, if not will schedule colonoscopy sooner.  Prostate: declined based on age.  Lab Results  Component Value Date   PSA1 0.3 09/13/2016   PSA 0.21 07/25/2015   Immunization History  Administered Date(s) Administered  . Fluad Quad(high Dose 65+) 02/11/2019  . Influenza, High Dose Seasonal PF 07/14/2018, 07/14/2018  . Influenza,inj,Quad PF,6+ Mos 05/03/2015, 02/28/2016, 04/08/2017  . PFIZER SARS-COV-2 Vaccination 08/31/2019, 09/21/2019  . Pneumococcal Conjugate-13 09/13/2016  . Pneumococcal Polysaccharide-23 07/19/2010, 02/11/2019  . Tdap 03/14/2006, 12/31/2017    Functional Status Survey: Is the patient deaf or have difficulty hearing?: Yes ("some" with background  noise) Does the patient have difficulty seeing, even when wearing glasses/contacts?: No Does the patient have difficulty concentrating, remembering, or making decisions?: No Does the patient have difficulty walking or climbing stairs?: No Does the patient have difficulty dressing or bathing?: No Does the patient have difficulty doing errands alone such as visiting a doctor's office or shopping?: No  No recent hearing test. some difficulty with background noise.   6CIT Screen 01/25/2020 03/04/2019  What Year? 0 points 0 points  What month? - 0 points  What time? 0 points 0 points  Count back from 20 0 points 0 points  Months in reverse 0 points 0 points  Repeat phrase 0 points 0 points  Total Score - 0     Office Visit from 01/25/2020 in [facility] at [facility]  AUDIT-C Score 0      Hearing Screening   '125Hz'  '250Hz'  '500Hz'  '1000Hz'  '2000Hz'  '3000Hz'  '4000Hz'  '6000Hz'  '8000Hz'   Right ear:           Left ear:             Visual Acuity Screening   Right eye Left eye Both eyes  Without correction:     With correction: 20/30 20/25/ 20/25   Advanced directives: He does have advanced directives, no changes requested.  Dental:not recent - recommended scheduling.   Exercise: limited by knee pain.    History Patient Active Problem List   Diagnosis Date Noted  . Carpal tunnel syndrome, left upper limb 11/24/2019  . Unilateral primary osteoarthritis, left knee 11/02/2019  . Unilateral primary osteoarthritis, right knee 11/02/2019  . Carpal tunnel syndrome, right upper limb 02/18/2018  . Anxiety and depression 10/28/2016  . Gastroesophageal reflux disease without esophagitis 10/28/2016  . Seasonal allergic rhinitis due to pollen 10/28/2016  . Primary osteoarthritis of left knee 07/25/2015  . Essential hypertension 07/25/2015  . Hypercholesteremia 07/25/2015  . Type 2 diabetes mellitus with diabetic polyneuropathy, without long-term current use of insulin ([place]) 07/25/2015   Past Medical History:   Diagnosis Date  . Allergic rhinitis   . Allergy   . Anxiety   . Arthritis   . Benign hypertension   . Cataract   . Depression   . Diabetes mellitus ([place])   . GERD (gastroesophageal reflux disease)   . History of pneumonia   . HLD (hyperlipidemia)   . Neuromuscular disorder [facility])    Past Surgical History:  Procedure Laterality Date  . CARPAL TUNNEL RELEASE Bilateral 2020 Right, 2021 left  . CATARACT EXTRACTION     bilateral  . EYE SURGERY    . KNEE SURGERY     right   Allergies  Allergen Reactions  . Actos [Pioglitazone] Other (See Comments), Shortness Of Breath and Swelling    Blood sugar went up and down and sweating profusely  . Codeine Nausea Only and Palpitations    Palpitations, SOB   Prior to Admission medications   Medication Sig Start Date End Date Taking? Authorizing Provider  albuterol (VENTOLIN HFA) 108 (90 Base) MCG/ACT inhaler Inhale 1-2 puffs into the lungs every 4 (four) hours as needed for wheezing or shortness of breath. Reported on 10/19/2015 09/07/19  Yes Wendie Agreste, MD  aspirin 81 MG tablet Take 81 mg by mouth daily.   Yes [provider]  blood glucose meter kit and supplies 1 each by Other route as directed. Dispense based on patient and insurance preference. Check once per day (variable times).  Accu check. 07/29/18  Yes Wendie Agreste, MD  cetirizine (ZYRTEC) 10 MG tablet Take 10 mg by mouth daily.   Yes [provider]  Cyanocobalamin (VITAMIN B 12 PO) Take 1,000 mg by mouth daily.   Yes [provider]  gabapentin (NEURONTIN) 300 MG capsule Take 1-2 capsules (300-600 mg total) by mouth 2 (two) times daily as needed. 02/11/19  Yes Wendie Agreste, MD  glimepiride (AMARYL) 4 MG tablet Take 1 tablet (4 mg total) by mouth 2 (two) times daily. 09/07/19  Yes Wendie Agreste, MD  glucose blood (ONE TOUCH ULTRA TEST) test strip Test Twice a day as directed 10/27/14  Yes [provider]  glucose blood test strip  check sugars once daily. Dx: E11.42 11/25/18  Yes Wendie Agreste, MD  HYDROcodone-acetaminophen (NORCO/VICODIN) 5-325 MG tablet Take 1 tablet by mouth every 6 (six) hours as needed for moderate pain. 12/03/19  Yes Petrarca, Mike Craze, PA-C  ibuprofen (ADVIL,MOTRIN) 200 MG  tablet Take 800 mg by mouth every morning.   Yes [provider]  Lancets (ONETOUCH DELICA PLUS [id number]) [place] USE 1 TO CHECK GLUCOSE ONCE DAILY 08/04/18  Yes [provider]  liraglutide (VICTOZA) 18 MG/3ML SOPN Inject 0.3 mLs (1.8 mg total) into the skin daily. I 09/07/19  Yes Wendie Agreste, MD  lisinopril (ZESTRIL) 20 MG tablet Take 1 tablet (20 mg total) by mouth daily. 09/07/19  Yes Wendie Agreste, MD  metFORMIN (GLUCOPHAGE) 1000 MG tablet TAKE 1 TABLET BY MOUTH TWICE DAILY WITH A MEAL 12/10/19  Yes Wendie Agreste, MD  montelukast (SINGULAIR) 10 MG tablet Take 1 tablet (10 mg total) by mouth at bedtime. 09/07/19  Yes Wendie Agreste, MD  omeprazole (PRILOSEC) 20 MG capsule Take 1 capsule (20 mg total) by mouth daily. 09/07/19  Yes Wendie Agreste, MD  [facility] ULTRA test strip USE 1 STRIP TO CHECK GLUCOSE ONCE DAILY 11/25/18  Yes Wendie Agreste, MD  pravastatin (PRAVACHOL) 80 MG tablet Take 1 tablet (80 mg total) by mouth daily. 09/07/19  Yes Wendie Agreste, MD  sertraline (ZOLOFT) 100 MG tablet Take 2 tablets (200 mg total) by mouth daily. Start with one per day for initial 2 weeks, then increase to 2 per day. 09/07/19  Yes Wendie Agreste, MD  traMADol (ULTRAM) 50 MG tablet TAKE 1 TABLET BY MOUTH EVERY 6 HOURS AS NEEDED 01/07/20  Yes Wendie Agreste, MD   Social History   Socioeconomic History  . Marital status: Married    Spouse name: Not on file  . Number of children: 3  . Years of education: Not on file  . Highest education level: Not on file  Occupational History  . Occupation: Dealer, Company secretary  Tobacco Use  . Smoking status: Never Smoker  . Smokeless tobacco: Never Used  Vaping  Use  . Vaping Use: Never used  Substance and Sexual Activity  . Alcohol use: No  . Drug use: No  . Sexual activity: Not Currently  Other Topics Concern  . Not on file  Social History Narrative  . Not on file   Social Determinants of Health   Financial Resource Strain:   . Difficulty of Paying Living Expenses:   Food Insecurity:   . Worried About Charity fundraiser in the Last Year:   . Arboriculturist in the Last Year:   Transportation Needs:   . Film/video editor (Medical):   Marland Kitchen Lack of Transportation (Non-Medical):   Physical Activity:   . Days of Exercise per Week:   . Minutes of Exercise per Session:   Stress:   . Feeling of Stress :   Social Connections:   . Frequency of Communication with Friends and Family:   . Frequency of Social Gatherings with Friends and Family:   . Attends Religious Services:   . Active Member of Clubs or Organizations:   . Attends Archivist Meetings:   Marland Kitchen Marital Status:   Intimate Partner Violence:   . Fear of Current or Ex-Partner:   . Emotionally Abused:   Marland Kitchen Physically Abused:   . Sexually Abused:     Review of Systems 13 point review of systems per patient health survey noted.  Negative other than as indicated above or in HPI.    Objective:   Vitals:   01/25/20 1014 01/25/20 1025  BP: (!) 151/79 (!) 136/78  Pulse: 75   Resp: 15   Temp: 97.8 F (  36.6 C)   TempSrc: Temporal   SpO2: 97%   Weight: (!) 239 lb 3.2 oz (108.5 kg)   Height: '5\' 7"'  (1.702 m)      Physical Exam Vitals reviewed.  Constitutional:      Appearance: He is well-developed.  HENT:     Head: Normocephalic and atraumatic.     Right Ear: External ear normal.     Left Ear: External ear normal.  Eyes:     Conjunctiva/sclera: Conjunctivae normal.     Pupils: Pupils are equal, round, and reactive to light.  Neck:     Thyroid: No thyromegaly.  Cardiovascular:     Rate and Rhythm: Normal rate and regular rhythm.     Heart sounds: Normal  heart sounds.  Pulmonary:     Effort: Pulmonary effort is normal. No respiratory distress.     Breath sounds: Normal breath sounds. No wheezing.  Abdominal:     General: There is no distension.     Palpations: Abdomen is soft.     Tenderness: There is no abdominal tenderness.     Hernia: There is no hernia in the left inguinal area or right inguinal area.  Genitourinary:    Prostate: Normal.  Musculoskeletal:        General: No tenderness. Normal range of motion.     Cervical back: Normal range of motion and neck supple.     Comments: Knee exam limited/deferred.    Lymphadenopathy:     Cervical: No cervical adenopathy.  Skin:    General: Skin is warm and dry.  Neurological:     Mental Status: He is alert and oriented to person, place, and time.     Deep Tendon Reflexes: Reflexes are normal and symmetric.  Psychiatric:        Behavior: Behavior normal.    Results for orders placed or performed in visit on 01/25/20  POCT glycosylated hemoglobin (Hb A1C)  Result Value Ref Range   Hemoglobin A1C 7.4 (A) 4.0 - 5.6 %   HbA1c POC (<> result, manual entry)     HbA1c, POC (prediabetic range)     HbA1c, POC (controlled diabetic range)       Assessment & Plan:  Daniel [name] is a 69 y.o. male . Medicare annual wellness visit, subsequent  - - anticipatory guidance as below in AVS, screening labs if needed. Health maintenance items as above in HPI discussed/recommended as applicable.  - no concerning responses on depression, fall, or functional status screening. Any positive responses noted as above. Advanced directives discussed as in CHL.   Type 2 diabetes mellitus with diabetic polyneuropathy, without long-term current use of insulin ([place]) - Plan: POCT glycosylated hemoglobin (Hb A1C), Microalbumin / creatinine urine ratio, gabapentin (NEURONTIN) 300 MG capsule, liraglutide (VICTOZA) 18 MG/3ML SOPN, glimepiride (AMARYL) 4 MG tablet  - improved control. Continue same. Hold gliepiride  if small meal, no skipped meals. rtc precautions if recurrence of hypoglycemia.   Essential hypertension - Plan: Comprehensive metabolic panel  - stable on recheck. rtc precautions if elevated home readings.   Chronic pain of both knees  - plan of surgery discussion with ortho now that A1c has improved. Continue tramadol if needed for now  Constipation, unspecified constipation type  - suspect component of tramadol use. Ortho eval planned. otc fiber supplement, miralax if needed, handout given.   Decreased hearing, unspecified laterality - Plan: Ambulatory referral to Audiology  - refer to audiology.   Hypercholesteremia - Plan: Lipid panel  -  check lipids, continue pravastatin.   Meds ordered this encounter  Medications  . gabapentin (NEURONTIN) 300 MG capsule    Sig: Take 1-2 capsules (300-600 mg total) by mouth 2 (two) times daily as needed.    Dispense:  180 capsule    Refill:  3  . liraglutide (VICTOZA) 18 MG/3ML SOPN    Sig: Inject 0.3 mLs (1.8 mg total) into the skin daily. I    Dispense:  3 mL    Refill:  5  . glimepiride (AMARYL) 4 MG tablet    Sig: Take 1 tablet (4 mg total) by mouth 2 (two) times daily.    Dispense:  180 tablet    Refill:  0    Ok to place on hold if needed.   Patient Instructions   For constipation: Continue vegetables and fiber in diet. Can add metamucil or citrucel daily. Ok to continue miralax daily if needed for now.   Great news with lower A1c. No new med changes for now.  To help prevent low blood sugars make sure to not skip meals. If you have a smaller meal than usual, then do not take glimepiride with that meal. If return of low blood sugars, schedule appointment to change meds.  Return to the clinic or go to the nearest emergency room if any of your symptoms worsen or new symptoms occur.  If home blood pressure remains above 140/90, return to discuss med changes.   I do recommend audiogram/hearing test. I will refer you for testing.     Return to the clinic or go to the nearest emergency room if any of your symptoms worsen or new symptoms occur.   Constipation, Adult Constipation is when a person has fewer bowel movements in a week than normal, has difficulty having a bowel movement, or has stools that are dry, hard, or larger than normal. Constipation may be caused by an underlying condition. It may become worse with age if a person takes certain medicines and does not take in enough fluids. Follow these instructions at home: Eating and drinking   Eat foods that have a lot of fiber, such as fresh fruits and vegetables, whole grains, and beans.  Limit foods that are high in fat, low in fiber, or overly processed, such as french fries, hamburgers, cookies, candies, and soda.  Drink enough fluid to keep your urine clear or pale yellow. General instructions  Exercise regularly or as told by your health care provider.  Go to the restroom when you have the urge to go. Do not hold it in.  Take over-the-counter and prescription medicines only as told by your health care provider. These include any fiber supplements.  Practice pelvic floor retraining exercises, such as deep breathing while relaxing the lower abdomen and pelvic floor relaxation during bowel movements.  Watch your condition for any changes.  Keep all follow-up visits as told by your health care provider. This is important. Contact a health care provider if:  You have pain that gets worse.  You have a fever.  You do not have a bowel movement after 4 days.  You vomit.  You are not hungry.  You lose weight.  You are bleeding from the anus.  You have thin, pencil-like stools. Get help right away if:  You have a fever and your symptoms suddenly get worse.  You leak stool or have blood in your stool.  Your abdomen is bloated.  You have severe pain in your abdomen.  You feel  dizzy or you faint. This information is not intended to replace  advice given to you by your health care provider. Make sure you discuss any questions you have with your health care provider. Document Revised: 05/31/2017 Document Reviewed: 12/07/2015 Elsevier Patient Education  [employer].    If you have lab work done today you will be contacted with your lab results within the next 2 weeks.  If you have not heard from [place] then please contact us. The fastest way to get your results is to register for My Chart.   IF you received an x-ray today, you will receive an invoice from [facility]. Please contact [facility] at [phone] with questions or concerns regarding your invoice.   IF you received labwork today, you will receive an invoice from [facility]. Please contact [facility] at [phone] with questions or concerns regarding your invoice.   Our billing staff will not be able to assist you with questions regarding bills from these companies.  You will be contacted with the lab results as soon as they are available. The fastest way to get your results is to activate your My Chart account. Instructions are located on the last page of this paperwork. If you have not heard from [place] regarding the results in 2 weeks, please contact this office.         Signed, Merri Ray, MD [facility]

## 2020-01-26 LAB — COMPREHENSIVE METABOLIC PANEL
ALT: 21 IU/L (ref 0–44)
AST: 21 IU/L (ref 0–40)
Albumin/Globulin Ratio: 2.1 (ref 1.2–2.2)
Albumin: 4.8 g/dL — ABNORMAL HIGH (ref 3.7–4.7)
Alkaline Phosphatase: 90 IU/L (ref 48–121)
BUN/Creatinine Ratio: 21 (ref 10–24)
BUN: 26 mg/dL (ref 8–27)
Bilirubin Total: <0.2 mg/dL (ref 0.0–1.2)
CO2: 25 mmol/L (ref 20–29)
Calcium: 9.5 mg/dL (ref 8.6–10.2)
Chloride: 102 mmol/L (ref 96–106)
Creatinine, Ser: 1.22 mg/dL (ref 0.76–1.27)
GFR calc Af Amer: 69 mL/min/{1.73_m2} (ref >59)
GFR calc non Af Amer: 59 mL/min/{1.73_m2} — ABNORMAL LOW (ref >59)
Globulin, Total: 2.3 g/dL (ref 1.5–4.5)
Glucose: 106 mg/dL — ABNORMAL HIGH (ref 65–99)
Potassium: 5.7 mmol/L — ABNORMAL HIGH (ref 3.5–5.2)
Sodium: 141 mmol/L (ref 134–144)
Total Protein: 7.1 g/dL (ref 6.0–8.5)

## 2020-01-26 LAB — LIPID PANEL
Chol/HDL Ratio: 3.4 ratio (ref 0.0–5.0)
Cholesterol, Total: 196 mg/dL (ref 100–199)
HDL: 58 mg/dL (ref >39)
LDL Chol Calc (NIH): 102 mg/dL — ABNORMAL HIGH (ref 0–99)
Triglycerides: 214 mg/dL — ABNORMAL HIGH (ref 0–149)
VLDL Cholesterol Cal: 36 mg/dL (ref 5–40)

## 2020-01-26 LAB — MICROALBUMIN / CREATININE URINE RATIO
Creatinine, Urine: 117.8 mg/dL
Microalb/Creat Ratio: 25 mg/g creat (ref 0–29)
Microalbumin, Urine: 29.8 ug/mL

## 2020-01-28 ENCOUNTER — Ambulatory Visit: Payer: Medicare Other | Attending: Family Medicine | Admitting: Audiologist

## 2020-01-28 ENCOUNTER — Other Ambulatory Visit: Payer: Self-pay

## 2020-01-28 DIAGNOSIS — H903 Sensorineural hearing loss, bilateral: Secondary | ICD-10-CM | POA: Diagnosis not present

## 2020-01-28 NOTE — Procedures (Signed)
  Outpatient Audiology and [facility] [address], [place]  [address] [phone]  AUDIOLOGICAL  EVALUATION  NAME: Daniel [name]    DOB:   [birth year]      MRN: [id number]                                     DATE: 01/28/2020     REFERENT: Wendie Agreste, MD STATUS: Outpatient DIAGNOSIS: Sensory hearing loss, bilateral  History: [name] was seen for an audiological evaluation.  [name] is receiving a hearing evaluation due to concerns for hearing loss. [name] has difficulty hearing in background noise, crowds, and when people are at a distance. He cannot hear his wife when she talks from another room with the TV on. This difficulty began gradually starting 2-3 years ago. No pain or pressure reported in either ear. No tinnitus present in either ear. 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  Medical history positive for diabetes, type II, with neuropathy which is a high risk factor for hearing loss. No other relevant case history reported.    Evaluation:   Otoscopy showed a partial view of the tympanic membranes with some non-occluding cerumen, bilaterally  Tympanometry results were consistent with normal function of the middle ear, bilaterally    Audiometric testing was completed using conventional audiometry with insert transducer. Speech Recognition Thresholds were consistent with pure tone averages. Word Recognition was fair at conversation level, and improved an elevated level. 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 Test results are consistent with a noise notch at 4k Hz in both ears showing damage from excessive noise exposure.   [name] presented bilaterally showed a mild speech in noise loss (3.0dB SNR average).   Results:  The test results were reviewed with [name]. He was counseled on the nature and degree  of his hear loss. A high frequency hearing loss centered at 4k Hz causes people to miss the high pitched consonants such as /f/ /s/ and /sh/. This leads people hearing but not understanding as the vowels are audible but many of the consonants are not. His ability to understand speech improved significantly with extra volume showing he is a good candidate for hearing aids at this time. However he has to be ready and willing to wear the hearing aids consistently. He reported understanding. He was given several copies of the audiogram, a communication strategies list to share with his wife, and a list of local audiologists that dispense hearing aids.   Recommendations: 1. Amplification is necessary for both ears. Hearing aids can be purchased from a variety of locations. See provided list for locations in the [place] area.  2. Due to significant noise exposure and diabetes, annual monitoring of hearing loss progression is warranted. Return to [name] for where ever he purchases hearing aids for this testing.    Alfonse Alpers  Audiologist, Au.D., CCC-A 01/28/2020  7:41 AM  Cc: Wendie Agreste, MD

## 2020-02-02 ENCOUNTER — Ambulatory Visit: Payer: Medicare Other | Admitting: Orthopaedic Surgery

## 2020-02-02 ENCOUNTER — Other Ambulatory Visit: Payer: Self-pay | Admitting: Family Medicine

## 2020-02-02 ENCOUNTER — Encounter: Payer: Self-pay | Admitting: Orthopaedic Surgery

## 2020-02-02 VITALS — Ht 65.75 in | Wt 238.4 lb

## 2020-02-02 DIAGNOSIS — M1711 Unilateral primary osteoarthritis, right knee: Secondary | ICD-10-CM | POA: Diagnosis not present

## 2020-02-02 DIAGNOSIS — M1712 Unilateral primary osteoarthritis, left knee: Secondary | ICD-10-CM

## 2020-02-02 DIAGNOSIS — E875 Hyperkalemia: Secondary | ICD-10-CM

## 2020-02-02 NOTE — Progress Notes (Signed)
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  He is in need of knee replacement surgery but we had to delay this due to a hemoglobin A1c of over 8.  He has worked on better blood glucose control.  A recent hemoglobin A1c just 8 days ago was down to 7.4.  At this point we can put him on the schedule for a knee replacement.  Both his knees have severe bone-on-bone wear well-documented with x-rays and clinical exam.  His left hurts him worse than the right.  He said no other acute change in medical status.  Currently denies any headache, chest pain, shortness of breath, fever, chills, nausea, vomiting.  His knee pain is daily and it is detrimentally affecting his actives daily living, his quality of life and his mobility.  He has tried and failed conservative treatment for well over a year now.  On exam both knees have varus malalignment and a mild effusion.  Both knees have painful arc of motion with significant medial joint line tenderness and patellofemoral crepitation.  Both knees are ligamentously stable but very painful on exam.  I went over with him in detail knee replacement surgery.  I showed him a knee model and explained his interoperative and postoperative course.  We talked about expectations.  We talked about the requirement for significant physical therapy to get this knee bending and moving and is working on pain control to maximize his quality of life and mobility as he goes through this process.  All questions and concerns were answered and addressed.  I did describe the risk of acute blood loss anemia, nerve or vessel injury, infection, DVT and implant failure.  He does wish to have the surgery scheduled in the near future.  We will work on getting that done and then see him back in 2 weeks after surgery.

## 2020-02-04 ENCOUNTER — Encounter: Payer: Self-pay | Admitting: Orthopaedic Surgery

## 2020-02-04 ENCOUNTER — Ambulatory Visit (INDEPENDENT_AMBULATORY_CARE_PROVIDER_SITE_OTHER): Payer: Medicare Other | Admitting: Orthopaedic Surgery

## 2020-02-04 VITALS — Ht 65.0 in | Wt 238.0 lb

## 2020-02-04 DIAGNOSIS — M65352 Trigger finger, left little finger: Secondary | ICD-10-CM

## 2020-02-04 DIAGNOSIS — G5602 Carpal tunnel syndrome, left upper limb: Secondary | ICD-10-CM

## 2020-02-04 MED ORDER — BUPIVACAINE HCL 0.25 % IJ SOLN
0.3300 mL | INTRAMUSCULAR | Status: AC | PRN
  Administered 2020-02-04: .33 mL

## 2020-02-04 MED ORDER — LIDOCAINE HCL 1 % IJ SOLN
0.3000 mL | INTRAMUSCULAR | Status: AC | PRN
Start: 2020-02-04 — End: 2020-02-04
  Administered 2020-02-04: .3 mL

## 2020-02-04 MED ORDER — METHYLPREDNISOLONE ACETATE 40 MG/ML IJ SUSP
20.0000 mg | INTRAMUSCULAR | Status: AC | PRN
  Administered 2020-02-04: 20 mg

## 2020-02-04 NOTE — Progress Notes (Signed)
Office Visit Note   Patient: Daniel [name]           Date of Birth: 10/27/1950           MRN: [id number] Visit Date: 02/04/2020              Requested by: Wendie Agreste, MD [address] [place],  [place] [address] PCP: Wendie Agreste, MD   Assessment & Plan: Visit Diagnoses:  1. Carpal tunnel syndrome, left upper limb   2. Trigger finger, left little finger     Plan:  #1: At this time are going to inject the left little trigger finger and that was accomplished atraumatically by Dr. Durward Fortes.  Tolerated the procedure well. #2: No further intervention on the left carpal tunnel release surgery.  Doing well overall.  No symptoms at this time.  Follow-Up Instructions: No follow-ups on file.   Orders:  Orders Placed This Encounter  Procedures  . Hand/UE Inj   No orders of the defined types were placed in this encounter.     Procedures: Hand/UE Inj: L small A1 for trigger finger on 02/04/2020 1:38 PM Indications: tendon swelling Details: 27 G needle, volar approach Medications: 0.3 mL lidocaine 1 %; 0.33 mL bupivacaine 0.25 %; 20 mg methylPREDNISolone acetate 40 MG/ML Outcome: tolerated well, no immediate complications Procedure, treatment alternatives, risks and benefits explained, specific risks discussed. Consent was given by the patient. Immediately prior to procedure a time out was called to verify the correct patient, procedure, equipment, support staff and site/side marked as required. Patient was prepped and draped in the usual sterile fashion.       Clinical Data: No additional findings.   Subjective: Chief Complaint  Patient presents with  . Left Wrist - Follow-up  Patient presents today for follow up on his left wrist. He is now 2 months out from left carpal tunnel release. Patient states that his wrist is doing well. He states that he has a left pinky finger that gets stuck. This has been happening since before his carpal tunnel surgery.    HPI  Review of Systems  Constitutional: Negative for fatigue.  HENT: Negative for ear pain.   Eyes: Negative for pain.  Respiratory: Negative for shortness of breath.   Cardiovascular: Negative for leg swelling.  Gastrointestinal: Positive for constipation. Negative for diarrhea.  Endocrine: Negative for cold intolerance and heat intolerance.  Genitourinary: Negative for difficulty urinating.  Musculoskeletal: Negative for joint swelling.  Skin: Negative for rash.  Allergic/Immunologic: Negative for food allergies.  Neurological: Negative for weakness.  Hematological: Does not bruise/bleed easily.  Psychiatric/Behavioral: Negative for sleep disturbance.     Objective: Vital Signs: Ht 5' 5" (1.651 m)   Wt 238 lb (108 kg)   BMI 39.61 kg/m   Physical Exam Constitutional:      Appearance: Normal appearance. He is well-developed.  HENT:     Head: Normocephalic and atraumatic.  Eyes:     Pupils: Pupils are equal, round, and reactive to light.  Pulmonary:     Effort: Pulmonary effort is normal.  Skin:    General: Skin is warm and dry.  Neurological:     Mental Status: He is alert and oriented to person, place, and time.  Psychiatric:        Behavior: Behavior normal.     Ortho Exam  Exam today reveals the wound to be well-healed on the palm of the left hand.  Sensation is intact to light touch in the  carpal tunnel median nerve. He does however have a palpable catching of his left little finger.  I can feel a nodule noted on the tendon.  Neurovascular intact distally.  Specialty Comments:  No specialty comments available.  Imaging: No results found.   PMFS History: Current Outpatient Medications  Medication Sig Dispense Refill  . albuterol (VENTOLIN HFA) 108 (90 Base) MCG/ACT inhaler Inhale 1-2 puffs into the lungs every 4 (four) hours as needed for wheezing or shortness of breath. Reported on 10/19/2015 18 g 0  . aspirin 81 MG tablet Take 81 mg by mouth daily.     . blood glucose meter kit and supplies 1 each by Other route as directed. Dispense based on patient and insurance preference. Check once per day (variable times).  Accu check. 1 each 0  . cetirizine (ZYRTEC) 10 MG tablet Take 10 mg by mouth daily.    . Cyanocobalamin (VITAMIN B 12 PO) Take 1,000 mg by mouth daily.    . gabapentin (NEURONTIN) 300 MG capsule Take 1-2 capsules (300-600 mg total) by mouth 2 (two) times daily as needed. 180 capsule 3  . glimepiride (AMARYL) 4 MG tablet Take 1 tablet (4 mg total) by mouth 2 (two) times daily. 180 tablet 0  . glucose blood (ONE TOUCH ULTRA TEST) test strip Test Twice a day as directed    . glucose blood test strip check sugars once daily. Dx: E11.42 100 each 3  . HYDROcodone-acetaminophen (NORCO/VICODIN) 5-325 MG tablet Take 1 tablet by mouth every 6 (six) hours as needed for moderate pain. 20 tablet 0  . ibuprofen (ADVIL,MOTRIN) 200 MG tablet Take 800 mg by mouth every morning.    . Lancets (ONETOUCH DELICA PLUS LANCET33G) MISC USE 1 TO CHECK GLUCOSE ONCE DAILY    . liraglutide (VICTOZA) 18 MG/3ML SOPN Inject 0.3 mLs (1.8 mg total) into the skin daily. I 3 mL 5  . lisinopril (ZESTRIL) 20 MG tablet Take 1 tablet (20 mg total) by mouth daily. 90 tablet 3  . metFORMIN (GLUCOPHAGE) 1000 MG tablet TAKE 1 TABLET BY MOUTH TWICE DAILY WITH A MEAL 180 tablet 0  . montelukast (SINGULAIR) 10 MG tablet Take 1 tablet (10 mg total) by mouth at bedtime. 90 tablet 1  . omeprazole (PRILOSEC) 20 MG capsule Take 1 capsule (20 mg total) by mouth daily. 90 capsule 3  . ONETOUCH ULTRA test strip USE 1 STRIP TO CHECK GLUCOSE ONCE DAILY 100 each 1  . pravastatin (PRAVACHOL) 80 MG tablet Take 1 tablet (80 mg total) by mouth daily. 90 tablet 1  . sertraline (ZOLOFT) 100 MG tablet Take 2 tablets (200 mg total) by mouth daily. Start with one per day for initial 2 weeks, then increase to 2 per day. 180 tablet 1  . traMADol (ULTRAM) 50 MG tablet TAKE 1 TABLET BY MOUTH EVERY 6  HOURS AS NEEDED 120 tablet 0   No current facility-administered medications for this visit.    Patient Active Problem List   Diagnosis Date Noted  . Trigger finger, left little finger 02/04/2020  . Carpal tunnel syndrome, left upper limb 11/24/2019  . Unilateral primary osteoarthritis, left knee 11/02/2019  . Unilateral primary osteoarthritis, right knee 11/02/2019  . Carpal tunnel syndrome, right upper limb 02/18/2018  . Anxiety and depression 10/28/2016  . Gastroesophageal reflux disease without esophagitis 10/28/2016  . Seasonal allergic rhinitis due to pollen 10/28/2016  . Primary osteoarthritis of left knee 07/25/2015  . Essential hypertension 07/25/2015  . Hypercholesteremia 07/25/2015  . Type   2 diabetes mellitus with diabetic polyneuropathy, without long-term current use of insulin ([place]) 07/25/2015   Past Medical History:  Diagnosis Date  . Allergic rhinitis   . Allergy   . Anxiety   . Arthritis   . Benign hypertension   . Cataract   . Depression   . Diabetes mellitus ([place])   . GERD (gastroesophageal reflux disease)   . History of pneumonia   . HLD (hyperlipidemia)   . Neuromuscular disorder ([place])     Family History  Problem Relation Age of Onset  . Diabetes Father   . Colon polyps Father   . Atrial fibrillation Mother   . Prostate cancer Paternal Uncle   . Cancer Sister   . Esophageal cancer Neg Hx   . Rectal cancer Neg Hx   . Stomach cancer Neg Hx     Past Surgical History:  Procedure Laterality Date  . CARPAL TUNNEL RELEASE Bilateral 2020 Right, 2021 left  . CATARACT EXTRACTION     bilateral  . EYE SURGERY    . KNEE SURGERY     right   Social History   Occupational History  . Occupation: Dealer, Company secretary  Tobacco Use  . Smoking status: Never Smoker  . Smokeless tobacco: Never Used  Vaping Use  . Vaping Use: Never used  Substance and Sexual Activity  . Alcohol use: No  . Drug use: No  . Sexual activity: Not Currently

## 2020-02-05 ENCOUNTER — Other Ambulatory Visit: Payer: Self-pay | Admitting: Family Medicine

## 2020-02-05 DIAGNOSIS — M25562 Pain in left knee: Secondary | ICD-10-CM

## 2020-02-05 DIAGNOSIS — G8929 Other chronic pain: Secondary | ICD-10-CM

## 2020-02-05 NOTE — Telephone Encounter (Signed)
Requested medication (s) are due for refill today - yes   Requested medication (s) are on the active medication list -yes  Future visit scheduled -yes  Last refill: 01/07/20  Notes to clinic: Request for non delegated Rx  Requested Prescriptions  Pending Prescriptions Disp Refills   traMADol (ULTRAM) 50 MG tablet [Pharmacy Med Name: traMADol HCl 50 MG Oral Tablet] 120 tablet 0    Sig: TAKE 1 TABLET BY MOUTH EVERY 6 HOURS AS NEEDED      Not Delegated - Analgesics:  Opioid Agonists Failed - 02/05/2020 11:59 AM      Failed - This refill cannot be delegated      Failed - Urine Drug Screen completed in last 360 days.      Passed - Valid encounter within last 6 months    Recent Outpatient Visits           1 week ago Medicare annual wellness visit, subsequent   [facility] [place], Ranell Patrick, MD   5 months ago Type 2 diabetes mellitus with diabetic polyneuropathy, without long-term current use of insulin [facility])   [facility] [place] [facility], Ranell Patrick, MD   8 months ago Type 2 diabetes mellitus with diabetic polyneuropathy, without long-term current use of insulin [facility])   [facility] [place] [facility], Ranell Patrick, MD   11 months ago Medicare annual wellness visit, subsequent   [facility] [place], Ranell Patrick, MD   11 months ago Type 2 diabetes mellitus with diabetic polyneuropathy, without long-term current use of insulin [facility])   [facility] [place] [facility], Ranell Patrick, MD       Future Appointments             In 1 month Carlis Abbott Gillermo Murdoch, PA-C [place]   In 2 months Wendie Agreste, MD [facility] at [place], [facility]                Requested Prescriptions  Pending Prescriptions Disp Refills   traMADol (ULTRAM) 50 MG tablet [Pharmacy Med Name: traMADol HCl 50 MG Oral Tablet] 120 tablet 0    Sig: TAKE 1 TABLET BY MOUTH EVERY 6 HOURS AS NEEDED      Not Delegated - Analgesics:  Opioid Agonists Failed - 02/05/2020 11:59 AM      Failed -  This refill cannot be delegated      Failed - Urine Drug Screen completed in last 360 days.      Passed - Valid encounter within last 6 months    Recent Outpatient Visits           1 week ago Medicare annual wellness visit, subsequent   [facility] [place] [facility], Ranell Patrick, MD   5 months ago Type 2 diabetes mellitus with diabetic polyneuropathy, without long-term current use of insulin [facility])   [facility] [place], Ranell Patrick, MD   8 months ago Type 2 diabetes mellitus with diabetic polyneuropathy, without long-term current use of insulin [facility])   [facility] [place], Ranell Patrick, MD   11 months ago Medicare annual wellness visit, subsequent   [facility] [place], Ranell Patrick, MD   11 months ago Type 2 diabetes mellitus with diabetic polyneuropathy, without long-term current use of insulin [facility])   [facility] [place], Ranell Patrick, MD       Future Appointments             In 1  month Pete Pelt, PA-C [place]   In 2 months Carlota Raspberry Ranell Patrick, MD [facility] at [place], [facility]

## 2020-02-05 NOTE — Telephone Encounter (Signed)
Controlled substance database reviewed, last filled for #120 on July 8.  Plan to follow-up with orthopedics to discuss total joint replacement, discussed at recent visit.  Will refill tramadol for now

## 2020-02-05 NOTE — Telephone Encounter (Signed)
Patient is requesting a refill of the following medications: Requested Prescriptions   Pending Prescriptions Disp Refills   traMADol (ULTRAM) 50 MG tablet [Pharmacy Med Name: traMADol HCl 50 MG Oral Tablet] 120 tablet 0    Sig: TAKE 1 TABLET BY MOUTH EVERY 6 HOURS AS NEEDED    Date of patient request: 02/05/2020 Last office visit:  01/25/2020 [facility] Date of last refill:01/07/2020 Last refill amount: 120 tablets  Follow up time period per chart: 04/25/2020

## 2020-02-15 ENCOUNTER — Other Ambulatory Visit: Payer: Self-pay

## 2020-02-16 NOTE — Progress Notes (Signed)
Need orders in epic.  Surgery on 02/26/20.  Preop on 02/18/2020.  Thanks

## 2020-02-18 ENCOUNTER — Other Ambulatory Visit: Payer: Self-pay

## 2020-02-18 ENCOUNTER — Encounter (HOSPITAL_COMMUNITY)
Admission: RE | Admit: 2020-02-18 | Discharge: 2020-02-18 | Disposition: A | Discharge status: Home / Self Care | Payer: Medicare Other | Source: Ambulatory Visit | Attending: Orthopaedic Surgery | Admitting: Orthopaedic Surgery

## 2020-02-18 ENCOUNTER — Encounter (HOSPITAL_COMMUNITY): Payer: Self-pay

## 2020-02-18 ENCOUNTER — Encounter: Payer: Self-pay | Admitting: Orthopaedic Surgery

## 2020-02-18 DIAGNOSIS — Z01812 Encounter for preprocedural laboratory examination: Secondary | ICD-10-CM | POA: Diagnosis not present

## 2020-02-18 DIAGNOSIS — A4901 Methicillin susceptible Staphylococcus aureus infection, unspecified site: Secondary | ICD-10-CM | POA: Diagnosis not present

## 2020-02-18 DIAGNOSIS — Z01810 Encounter for preprocedural cardiovascular examination: Secondary | ICD-10-CM | POA: Insufficient documentation

## 2020-02-18 DIAGNOSIS — R9431 Abnormal electrocardiogram [ECG] [EKG]: Secondary | ICD-10-CM | POA: Insufficient documentation

## 2020-02-18 HISTORY — DX: Pneumonia, unspecified organism: J18.9

## 2020-02-18 LAB — BASIC METABOLIC PANEL
Anion gap: 8 (ref 5–15)
BUN: 29 mg/dL — ABNORMAL HIGH (ref 8–23)
CO2: 25 mmol/L (ref 22–32)
Calcium: 9.2 mg/dL (ref 8.9–10.3)
Chloride: 104 mmol/L (ref 98–111)
Creatinine, Ser: 1.01 mg/dL (ref 0.61–1.24)
GFR calc Af Amer: >60 mL/min (ref >60)
GFR calc non Af Amer: >60 mL/min (ref >60)
Glucose, Bld: 120 mg/dL — ABNORMAL HIGH (ref 70–99)
Potassium: 4.8 mmol/L (ref 3.5–5.1)
Sodium: 137 mmol/L (ref 135–145)

## 2020-02-18 LAB — CBC
HCT: 34.1 % — ABNORMAL LOW (ref 39.0–52.0)
Hemoglobin: 10.8 g/dL — ABNORMAL LOW (ref 13.0–17.0)
MCH: 30.6 pg (ref 26.0–34.0)
MCHC: 31.7 g/dL (ref 30.0–36.0)
MCV: 96.6 fL (ref 80.0–100.0)
Platelets: 303 10*3/uL (ref 150–400)
RBC: 3.53 MIL/uL — ABNORMAL LOW (ref 4.22–5.81)
RDW: 13 % (ref 11.5–15.5)
WBC: 9.5 10*3/uL (ref 4.0–10.5)
nRBC: 0 % (ref 0.0–0.2)

## 2020-02-18 LAB — SURGICAL PCR SCREEN
MRSA, PCR: NEGATIVE
Staphylococcus aureus: POSITIVE — AB

## 2020-02-18 NOTE — Progress Notes (Deleted)
DUE TO COVID-19 ONLY ONE VISITOR IS ALLOWED TO COME WITH YOU AND STAY IN THE WAITING ROOM ONLY DURING PRE OP AND PROCEDURE DAY OF SURGERY. THE 1 VISITOR  MAY VISIT WITH YOU AFTER SURGERY IN YOUR PRIVATE ROOM DURING VISITING HOURS ONLY!  YOU NEED TO HAVE A COVID 19 TEST ON_______ @_______ , THIS TEST MUST BE DONE BEFORE SURGERY,  COVID TESTING SITE [address] [place] [address], IT IS ON THE RIGHT GOING OUT [address] APPROXIMATELY  2 MINUTES PAST [employer] ON THE RIGHT. ONCE YOUR COVID TEST IS COMPLETED,  PLEASE BEGIN THE QUARANTINE INSTRUCTIONS AS OUTLINED IN YOUR HANDOUT.                Daniel [name]  02/18/2020   Your procedure is scheduled on:    Report to [facility] Main  Entrance   Report to admitting at AM     Call this number if you have problems the morning of surgery [phone]           REMEMBER: FOLLOW ALL [place]. DRINK 2 PRESURGERY ENSURE DRINKS THE NIGHT BEFORE SURGERY AT 1000 PM AND 1 PRESURGERY DRINK THE DAY OF THE PROCEDURE 3 HOURS PRIOR TO SCHEDULED SURGERY.  NOTHING BY MOUTH EXCEPT CLEAR LIQUIDS UNTIL THREE HOURS PRIOR TO SCHEDULED SURGERY. PLEASE FINISH PRESURGERY 3RD  ENSURE  DRINK PER SURGEON ORDER 3 HOURS PRIOR TO SCHEDULED SURGERY TIME WHICH NEEDS TO BE COMPLETED AT _________.     CLEAR LIQUID DIET   Foods Allowed                                                                      Coffee and tea, regular and decaf                           Plain Jell-O any favor except red or purple                                         Fruit ices (not with fruit pulp)                                      Iced Popsicles                                     Carbonated beverages, regular and diet                                    Cranberry, grape and apple juices Sports drinks like Gatorade Lightly seasoned clear broth or consume(fat free) Sugar, honey  syrup  _____________________________________________________________________      BRUSH YOUR TEETH MORNING OF SURGERY AND RINSE YOUR MOUTH OUT, NO CHEWING GUM CANDY OR MINTS.     Take these medicines the morning of surgery with A  SIP OF WATER:   DO NOT TAKE ANY DIABETIC MEDICATIONS DAY OF YOUR SURGERY                               You may not have any metal on your body including hair pins and              piercings  Do not wear jewelry, make-up, lotions, powders or perfumes, deodorant             Do not wear nail polish on your fingernails.  Do not shave  48 hours prior to surgery.              Men may shave face and neck.   Do not bring valuables to the hospital. [place].  Contacts, dentures or bridgework may not be worn into surgery.  Leave suitcase in the car. After surgery it may be brought to your room.                 Please read over the following fact sheets you were given: _____________________________________________________________________  [facility] - Preparing for Surgery Before surgery, you can play an important role.  Because skin is not sterile, your skin needs to be as free of germs as possible.  You can reduce the number of germs on your skin by washing with CHG (chlorahexidine gluconate) soap before surgery.  CHG is an antiseptic cleaner which kills germs and bonds with the skin to continue killing germs even after washing. Please DO NOT use if you have an allergy to CHG or antibacterial soaps.  If your skin becomes reddened/irritated stop using the CHG and inform your nurse when you arrive at Short Stay. Do not shave (including legs and underarms) for at least 48 hours prior to the first CHG shower.  You may shave your face/neck. Please follow these instructions carefully:  1.  Shower with CHG Soap the night before surgery and the  morning of Surgery.  2.  If you choose to wash your hair, wash your hair first  as usual with your  normal  shampoo.  3.  After you shampoo, rinse your hair and body thoroughly to remove the  shampoo.                           4.  Use CHG as you would any other liquid soap.  You can apply chg directly  to the skin and wash                       Gently with a scrungie or clean washcloth.  5.  Apply the CHG Soap to your body ONLY FROM THE NECK DOWN.   Do not use on face/ open                           Wound or open sores. Avoid contact with eyes, ears mouth and genitals (private parts).                       Wash face,  Genitals (private parts) with your normal soap.             6.  Wash thoroughly, paying special attention  to the area where your surgery  will be performed.  7.  Thoroughly rinse your body with warm water from the neck down.  8.  DO NOT shower/wash with your normal soap after using and rinsing off  the CHG Soap.                9.  Pat yourself dry with a clean towel.            10.  Wear clean pajamas.            11.  Place clean sheets on your bed the night of your first shower and do not  sleep with pets. Day of Surgery : Do not apply any lotions/deodorants the morning of surgery.  Please wear clean clothes to the hospital/surgery center.  FAILURE TO FOLLOW THESE INSTRUCTIONS MAY RESULT IN THE CANCELLATION OF YOUR SURGERY PATIENT SIGNATURE_________________________________  NURSE SIGNATURE__________________________________  ________________________________________________________________________            DUE TO COVID-19 ONLY ONE VISITOR IS ALLOWED TO COME WITH YOU AND STAY IN THE WAITING ROOM ONLY DURING PRE OP AND PROCEDURE DAY OF SURGERY. THE 1 VISITOR  MAY VISIT WITH YOU AFTER SURGERY IN YOUR PRIVATE ROOM DURING VISITING HOURS ONLY!  YOU NEED TO HAVE A COVID 19 TEST ON_______ @_______ , THIS TEST MUST BE DONE BEFORE SURGERY,  COVID TESTING SITE [address] [place] [address], IT IS ON THE RIGHT GOING OUT [address] APPROXIMATELY   2 MINUTES PAST [employer] ON THE RIGHT. ONCE YOUR COVID TEST IS COMPLETED,  PLEASE BEGIN THE QUARANTINE INSTRUCTIONS AS OUTLINED IN YOUR HANDOUT.                Daniel [name]  02/18/2020   Your procedure is scheduled on:    Report to [facility] Main  Entrance   Report to admitting at AM     Call this number if you have problems the morning of surgery [phone]           REMEMBER: FOLLOW ALL [place]. DRINK 2 PRESURGERY ENSURE DRINKS THE NIGHT BEFORE SURGERY AT 1000 PM AND 1 PRESURGERY DRINK THE DAY OF THE PROCEDURE 3 HOURS PRIOR TO SCHEDULED SURGERY.  NOTHING BY MOUTH EXCEPT CLEAR LIQUIDS UNTIL THREE HOURS PRIOR TO SCHEDULED SURGERY. PLEASE FINISH PRESURGERY 3RD  ENSURE  DRINK PER SURGEON ORDER 3 HOURS PRIOR TO SCHEDULED SURGERY TIME WHICH NEEDS TO BE COMPLETED AT _________.     CLEAR LIQUID DIET   Foods Allowed                                                                      Coffee and tea, regular and decaf                           Plain Jell-O any favor except red or purple                                         Fruit ices (not with fruit pulp)  Iced Popsicles                                     Carbonated beverages, regular and diet                                    Cranberry, grape and apple juices Sports drinks like Gatorade Lightly seasoned clear broth or consume(fat free) Sugar, honey syrup  _____________________________________________________________________      BRUSH YOUR TEETH MORNING OF SURGERY AND RINSE YOUR MOUTH OUT, NO CHEWING GUM CANDY OR MINTS.     Take these medicines the morning of surgery with A SIP OF WATER:   DO NOT TAKE ANY DIABETIC MEDICATIONS DAY OF YOUR SURGERY                               You may not have any metal on your body including hair pins and              piercings  Do not wear jewelry, make-up,  lotions, powders or perfumes, deodorant             Do not wear nail polish on your fingernails.  Do not shave  48 hours prior to surgery.              Men may shave face and neck.   Do not bring valuables to the hospital. [place].  Contacts, dentures or bridgework may not be worn into surgery.  Leave suitcase in the car. After surgery it may be brought to your room.                 Please read over the following fact sheets you were given: _____________________________________________________________________  [facility] - Preparing for Surgery Before surgery, you can play an important role.  Because skin is not sterile, your skin needs to be as free of germs as possible.  You can reduce the number of germs on your skin by washing with CHG (chlorahexidine gluconate) soap before surgery.  CHG is an antiseptic cleaner which kills germs and bonds with the skin to continue killing germs even after washing. Please DO NOT use if you have an allergy to CHG or antibacterial soaps.  If your skin becomes reddened/irritated stop using the CHG and inform your nurse when you arrive at Short Stay. Do not shave (including legs and underarms) for at least 48 hours prior to the first CHG shower.  You may shave your face/neck. Please follow these instructions carefully:  1.  Shower with CHG Soap the night before surgery and the  morning of Surgery.  2.  If you choose to wash your hair, wash your hair first as usual with your  normal  shampoo.  3.  After you shampoo, rinse your hair and body thoroughly to remove the  shampoo.                           4.  Use CHG as you would any other liquid soap.  You can apply chg directly  to the skin and wash  Gently with a scrungie or clean washcloth.  5.  Apply the CHG Soap to your body ONLY FROM THE NECK DOWN.   Do not use on face/ open                           Wound or open sores. Avoid contact  with eyes, ears mouth and genitals (private parts).                       Wash face,  Genitals (private parts) with your normal soap.             6.  Wash thoroughly, paying special attention to the area where your surgery  will be performed.  7.  Thoroughly rinse your body with warm water from the neck down.  8.  DO NOT shower/wash with your normal soap after using and rinsing off  the CHG Soap.                9.  Pat yourself dry with a clean towel.            10.  Wear clean pajamas.            11.  Place clean sheets on your bed the night of your first shower and do not  sleep with pets. Day of Surgery : Do not apply any lotions/deodorants the morning of surgery.  Please wear clean clothes to the hospital/surgery center.  FAILURE TO FOLLOW THESE INSTRUCTIONS MAY RESULT IN THE CANCELLATION OF YOUR SURGERY PATIENT SIGNATURE_________________________________  NURSE SIGNATURE__________________________________  ________________________________________________________________________            DUE TO COVID-19 ONLY ONE VISITOR IS ALLOWED TO COME WITH YOU AND STAY IN THE WAITING ROOM ONLY DURING PRE OP AND PROCEDURE DAY OF SURGERY. THE 1 VISITOR  MAY VISIT WITH YOU AFTER SURGERY IN YOUR PRIVATE ROOM DURING VISITING HOURS ONLY!  YOU NEED TO HAVE A COVID 19 TEST ON_______ @_______ , THIS TEST MUST BE DONE BEFORE SURGERY,  COVID TESTING SITE [address] [place] [address], IT IS ON THE RIGHT GOING OUT [address] APPROXIMATELY  2 MINUTES PAST [employer] ON THE RIGHT. ONCE YOUR COVID TEST IS COMPLETED,  PLEASE BEGIN THE QUARANTINE INSTRUCTIONS AS OUTLINED IN YOUR HANDOUT.                Daniel [name]  02/18/2020   Your procedure is scheduled on:    Report to [facility] Main  Entrance   Report to admitting at AM     Call this number if you have problems the morning of surgery [phone]           REMEMBER: FOLLOW ALL [place]. DRINK 2 PRESURGERY ENSURE DRINKS THE NIGHT BEFORE SURGERY AT 1000 PM AND 1 PRESURGERY DRINK THE DAY OF THE PROCEDURE 3 HOURS PRIOR TO SCHEDULED SURGERY.  NOTHING BY MOUTH EXCEPT CLEAR LIQUIDS UNTIL THREE HOURS PRIOR TO SCHEDULED SURGERY. PLEASE FINISH PRESURGERY 3RD  ENSURE  DRINK PER SURGEON ORDER 3 HOURS PRIOR TO SCHEDULED SURGERY TIME WHICH NEEDS TO BE COMPLETED AT _________.     CLEAR LIQUID DIET   Foods Allowed  Coffee and tea, regular and decaf                           Plain Jell-O any favor except red or purple                                         Fruit ices (not with fruit pulp)                                      Iced Popsicles                                     Carbonated beverages, regular and diet                                    Cranberry, grape and apple juices Sports drinks like Gatorade Lightly seasoned clear broth or consume(fat free) Sugar, honey syrup  _____________________________________________________________________      BRUSH YOUR TEETH MORNING OF SURGERY AND RINSE YOUR MOUTH OUT, NO CHEWING GUM CANDY OR MINTS.     Take these medicines the morning of surgery with A SIP OF WATER:   DO NOT TAKE ANY DIABETIC MEDICATIONS DAY OF YOUR SURGERY                               You may not have any metal on your body including hair pins and              piercings  Do not wear jewelry, make-up, lotions, powders or perfumes, deodorant             Do not wear nail polish on your fingernails.  Do not shave  48 hours prior to surgery.              Men may shave face and neck.   Do not bring valuables to the hospital. [place].  Contacts, dentures or bridgework may not be worn into surgery.  Leave suitcase in the car. After surgery it may be brought to your room.                 Please read over  the following fact sheets you were given: _____________________________________________________________________  [facility] - Preparing for Surgery Before surgery, you can play an important role.  Because skin is not sterile, your skin needs to be as free of germs as possible.  You can reduce the number of germs on your skin by washing with CHG (chlorahexidine gluconate) soap before surgery.  CHG is an antiseptic cleaner which kills germs and bonds with the skin to continue killing germs even after washing. Please DO NOT use if you have an allergy to CHG or antibacterial soaps.  If your skin becomes reddened/irritated stop using the CHG and inform your nurse when you arrive at Short Stay. Do not shave (including legs and underarms) for at least 48 hours prior to the first CHG shower.  You  may shave your face/neck. Please follow these instructions carefully:  1.  Shower with CHG Soap the night before surgery and the  morning of Surgery.  2.  If you choose to wash your hair, wash your hair first as usual with your  normal  shampoo.  3.  After you shampoo, rinse your hair and body thoroughly to remove the  shampoo.                           4.  Use CHG as you would any other liquid soap.  You can apply chg directly  to the skin and wash                       Gently with a scrungie or clean washcloth.  5.  Apply the CHG Soap to your body ONLY FROM THE NECK DOWN.   Do not use on face/ open                           Wound or open sores. Avoid contact with eyes, ears mouth and genitals (private parts).                       Wash face,  Genitals (private parts) with your normal soap.             6.  Wash thoroughly, paying special attention to the area where your surgery  will be performed.  7.  Thoroughly rinse your body with warm water from the neck down.  8.  DO NOT shower/wash with your normal soap after using and rinsing off  the CHG Soap.                9.  Pat yourself dry with a clean towel.             10.  Wear clean pajamas.            11.  Place clean sheets on your bed the night of your first shower and do not  sleep with pets. Day of Surgery : Do not apply any lotions/deodorants the morning of surgery.  Please wear clean clothes to the hospital/surgery center.  FAILURE TO FOLLOW THESE INSTRUCTIONS MAY RESULT IN THE CANCELLATION OF YOUR SURGERY PATIENT SIGNATURE_________________________________  NURSE SIGNATURE__________________________________  ________________________________________________________________________            DUE TO COVID-19 ONLY ONE VISITOR IS ALLOWED TO COME WITH YOU AND STAY IN THE WAITING ROOM ONLY DURING PRE OP AND PROCEDURE DAY OF SURGERY. THE 1 VISITOR  MAY VISIT WITH YOU AFTER SURGERY IN YOUR PRIVATE ROOM DURING VISITING HOURS ONLY!  YOU NEED TO HAVE A COVID 19 TEST ON_______ @_______ , THIS TEST MUST BE DONE BEFORE SURGERY,  COVID TESTING SITE [address] [place] [address], IT IS ON THE RIGHT GOING OUT [address] APPROXIMATELY  2 MINUTES PAST [employer] ON THE RIGHT. ONCE YOUR COVID TEST IS COMPLETED,  PLEASE BEGIN THE QUARANTINE INSTRUCTIONS AS OUTLINED IN YOUR HANDOUT.                Daniel [name]  02/18/2020   Your procedure is scheduled on:    Report to [facility] Main  Entrance   Report to admitting at AM     Call this number if you have problems the morning of surgery [phone]  REMEMBER: FOLLOW ALL YOUR [place] PREP INSTRUCTIONS WITH CLEAR LIQUIDS FROM YOUR SURGEON'S INSTRUCTIONS. DRINK 2 PRESURGERY ENSURE DRINKS THE NIGHT BEFORE SURGERY AT 1000 PM AND 1 PRESURGERY DRINK THE DAY OF THE PROCEDURE 3 HOURS PRIOR TO SCHEDULED SURGERY.  NOTHING BY MOUTH EXCEPT CLEAR LIQUIDS UNTIL THREE HOURS PRIOR TO SCHEDULED SURGERY. PLEASE FINISH PRESURGERY 3RD  ENSURE  DRINK PER SURGEON ORDER 3 HOURS PRIOR TO SCHEDULED SURGERY TIME WHICH NEEDS TO BE COMPLETED AT _________.     CLEAR LIQUID DIET   Foods  Allowed                                                                      Coffee and tea, regular and decaf                           Plain Jell-O any favor except red or purple                                         Fruit ices (not with fruit pulp)                                      Iced Popsicles                                     Carbonated beverages, regular and diet                                    Cranberry, grape and apple juices Sports drinks like Gatorade Lightly seasoned clear broth or consume(fat free) Sugar, honey syrup  _____________________________________________________________________      BRUSH YOUR TEETH MORNING OF SURGERY AND RINSE YOUR MOUTH OUT, NO CHEWING GUM CANDY OR MINTS.     Take these medicines the morning of surgery with A SIP OF WATER:   DO NOT TAKE ANY DIABETIC MEDICATIONS DAY OF YOUR SURGERY                               You may not have any metal on your body including hair pins and              piercings  Do not wear jewelry, make-up, lotions, powders or perfumes, deodorant             Do not wear nail polish on your fingernails.  Do not shave  48 hours prior to surgery.              Men may shave face and neck.   Do not bring valuables to the hospital. [place].  Contacts, dentures or bridgework may not be worn into surgery.  Leave suitcase in the car. After surgery it  may be brought to your room.                 Please read over the following fact sheets you were given: _____________________________________________________________________  [facility] - Preparing for Surgery Before surgery, you can play an important role.  Because skin is not sterile, your skin needs to be as free of germs as possible.  You can reduce the number of germs on your skin by washing with CHG (chlorahexidine gluconate) soap before surgery.  CHG is an antiseptic cleaner which kills germs and bonds with the  skin to continue killing germs even after washing. Please DO NOT use if you have an allergy to CHG or antibacterial soaps.  If your skin becomes reddened/irritated stop using the CHG and inform your nurse when you arrive at Short Stay. Do not shave (including legs and underarms) for at least 48 hours prior to the first CHG shower.  You may shave your face/neck. Please follow these instructions carefully:  1.  Shower with CHG Soap the night before surgery and the  morning of Surgery.  2.  If you choose to wash your hair, wash your hair first as usual with your  normal  shampoo.  3.  After you shampoo, rinse your hair and body thoroughly to remove the  shampoo.                           4.  Use CHG as you would any other liquid soap.  You can apply chg directly  to the skin and wash                       Gently with a scrungie or clean washcloth.  5.  Apply the CHG Soap to your body ONLY FROM THE NECK DOWN.   Do not use on face/ open                           Wound or open sores. Avoid contact with eyes, ears mouth and genitals (private parts).                       Wash face,  Genitals (private parts) with your normal soap.             6.  Wash thoroughly, paying special attention to the area where your surgery  will be performed.  7.  Thoroughly rinse your body with warm water from the neck down.  8.  DO NOT shower/wash with your normal soap after using and rinsing off  the CHG Soap.                9.  Pat yourself dry with a clean towel.            10.  Wear clean pajamas.            11.  Place clean sheets on your bed the night of your first shower and do not  sleep with pets. Day of Surgery : Do not apply any lotions/deodorants the morning of surgery.  Please wear clean clothes to the hospital/surgery center.  FAILURE TO FOLLOW THESE INSTRUCTIONS MAY RESULT IN THE CANCELLATION OF YOUR SURGERY PATIENT SIGNATURE_________________________________  NURSE  SIGNATURE__________________________________  ________________________________________________________________________            DUE TO COVID-19 ONLY ONE VISITOR IS ALLOWED TO COME WITH YOU  AND STAY IN THE WAITING ROOM ONLY DURING PRE OP AND PROCEDURE DAY OF SURGERY. THE 1 VISITOR  MAY VISIT WITH YOU AFTER SURGERY IN YOUR PRIVATE ROOM DURING VISITING HOURS ONLY!  YOU NEED TO HAVE A COVID 19 TEST ON_______ @_______ , THIS TEST MUST BE DONE BEFORE SURGERY,  COVID TESTING SITE [address] [place] [address], IT IS ON THE RIGHT GOING OUT [address] APPROXIMATELY  2 MINUTES PAST [employer] ON THE RIGHT. ONCE YOUR COVID TEST IS COMPLETED,  PLEASE BEGIN THE QUARANTINE INSTRUCTIONS AS OUTLINED IN YOUR HANDOUT.                Daniel [name]  02/18/2020   Your procedure is scheduled on:    Report to [facility] Main  Entrance   Report to admitting at AM     Call this number if you have problems the morning of surgery [phone]           REMEMBER: FOLLOW ALL [place]. DRINK 2 PRESURGERY ENSURE DRINKS THE NIGHT BEFORE SURGERY AT 1000 PM AND 1 PRESURGERY DRINK THE DAY OF THE PROCEDURE 3 HOURS PRIOR TO SCHEDULED SURGERY.  NOTHING BY MOUTH EXCEPT CLEAR LIQUIDS UNTIL THREE HOURS PRIOR TO SCHEDULED SURGERY. PLEASE FINISH PRESURGERY 3RD  ENSURE  DRINK PER SURGEON ORDER 3 HOURS PRIOR TO SCHEDULED SURGERY TIME WHICH NEEDS TO BE COMPLETED AT _________.     CLEAR LIQUID DIET   Foods Allowed                                                                      Coffee and tea, regular and decaf                           Plain Jell-O any favor except red or purple                                         Fruit ices (not with fruit pulp)                                      Iced Popsicles                                     Carbonated beverages, regular and diet                                     Cranberry, grape and apple juices Sports drinks like Gatorade Lightly seasoned clear broth or consume(fat free) Sugar, honey syrup  _____________________________________________________________________      BRUSH YOUR TEETH MORNING OF SURGERY AND RINSE YOUR MOUTH OUT, NO CHEWING GUM CANDY OR MINTS.     Take these medicines the morning of surgery with A SIP OF WATER:   DO NOT TAKE ANY DIABETIC MEDICATIONS DAY  OF YOUR SURGERY                               You may not have any metal on your body including hair pins and              piercings  Do not wear jewelry, make-up, lotions, powders or perfumes, deodorant             Do not wear nail polish on your fingernails.  Do not shave  48 hours prior to surgery.              Men may shave face and neck.   Do not bring valuables to the hospital. [place].  Contacts, dentures or bridgework may not be worn into surgery.  Leave suitcase in the car. After surgery it may be brought to your room.                 Please read over the following fact sheets you were given: _____________________________________________________________________  [facility] - Preparing for Surgery Before surgery, you can play an important role.  Because skin is not sterile, your skin needs to be as free of germs as possible.  You can reduce the number of germs on your skin by washing with CHG (chlorahexidine gluconate) soap before surgery.  CHG is an antiseptic cleaner which kills germs and bonds with the skin to continue killing germs even after washing. Please DO NOT use if you have an allergy to CHG or antibacterial soaps.  If your skin becomes reddened/irritated stop using the CHG and inform your nurse when you arrive at Short Stay. Do not shave (including legs and underarms) for at least 48 hours prior to the first CHG shower.  You may shave your face/neck. Please follow these instructions carefully:  1.  Shower  with CHG Soap the night before surgery and the  morning of Surgery.  2.  If you choose to wash your hair, wash your hair first as usual with your  normal  shampoo.  3.  After you shampoo, rinse your hair and body thoroughly to remove the  shampoo.                           4.  Use CHG as you would any other liquid soap.  You can apply chg directly  to the skin and wash                       Gently with a scrungie or clean washcloth.  5.  Apply the CHG Soap to your body ONLY FROM THE NECK DOWN.   Do not use on face/ open                           Wound or open sores. Avoid contact with eyes, ears mouth and genitals (private parts).                       Wash face,  Genitals (private parts) with your normal soap.             6.  Wash thoroughly, paying special attention to the area where your surgery  will be performed.  7.  Thoroughly rinse your body with warm water from the neck down.  8.  DO NOT shower/wash with your normal soap after using and rinsing off  the CHG Soap.                9.  Pat yourself dry with a clean towel.            10.  Wear clean pajamas.            11.  Place clean sheets on your bed the night of your first shower and do not  sleep with pets. Day of Surgery : Do not apply any lotions/deodorants the morning of surgery.  Please wear clean clothes to the hospital/surgery center.  FAILURE TO FOLLOW THESE INSTRUCTIONS MAY RESULT IN THE CANCELLATION OF YOUR SURGERY PATIENT SIGNATURE_________________________________  NURSE SIGNATURE__________________________________  ______      DUE TO COVID-19 ONLY ONE VISITOR IS ALLOWED TO COME WITH YOU AND STAY IN THE WAITING ROOM ONLY DURING PRE OP AND PROCEDURE DAY OF SURGERY. THE 1 VISITOR  MAY VISIT WITH YOU AFTER SURGERY IN YOUR PRIVATE ROOM DURING VISITING HOURS ONLY!  YOU NEED TO HAVE A COVID 19 TEST ON_______ @_______ , THIS TEST MUST BE DONE BEFORE SURGERY,  COVID TESTING SITE [address] [place] [address], IT  IS ON THE RIGHT GOING OUT [address] APPROXIMATELY  2 MINUTES PAST [employer] ON THE RIGHT. ONCE YOUR COVID TEST IS COMPLETED,  PLEASE BEGIN THE QUARANTINE INSTRUCTIONS AS OUTLINED IN YOUR HANDOUT.                Daniel [name]  02/18/2020   Your procedure is scheduled on:    Report to [facility] Main  Entrance   Report to admitting at AM     Call this number if you have problems the morning of surgery [phone]    REMEMBER: NO  SOLID FOOD CANDY OR GUM AFTER MIDNIGHT. CLEAR LIQUIDS UNTIL         . NOTHING BY MOUTH EXCEPT CLEAR LIQUIDS UNTIL    . PLEASE FINISH ENSURE DRINK PER SURGEON ORDER  WHICH NEEDS TO BE COMPLETED AT      .      CLEAR LIQUID DIET   Foods Allowed                                                                    Coffee and tea, regular and decaf                            Fruit ices (not with fruit pulp)                                      Iced Popsicles                                    Carbonated beverages, regular and diet  Cranberry, grape and apple juices Sports drinks like Gatorade Lightly seasoned clear broth or consume(fat free) Sugar, honey syrup ___________________________________________________________________      BRUSH YOUR TEETH MORNING OF SURGERY AND RINSE YOUR MOUTH OUT, NO CHEWING GUM CANDY OR MINTS.     Take these medicines the morning of surgery with A SIP OF WATER:   DO NOT TAKE ANY DIABETIC MEDICATIONS DAY OF YOUR SURGERY                               You may not have any metal on your body including hair pins and              piercings  Do not wear jewelry, make-up, lotions, powders or perfumes, deodorant             Do not wear nail polish on your fingernails.  Do not shave  48 hours prior to surgery.              Men may shave face and neck.   Do not bring valuables to the hospital. [place].  Contacts, dentures  or bridgework may not be worn into surgery.  Leave suitcase in the car. After surgery it may be brought to your room.     Patients discharged the day of surgery will not be allowed to drive home. IF YOU ARE HAVING SURGERY AND GOING HOME THE SAME DAY, YOU MUST HAVE AN ADULT TO DRIVE YOU HOME AND BE WITH YOU FOR 24 HOURS. YOU MAY GO HOME BY TAXI OR UBER OR ORTHERWISE, BUT AN ADULT MUST ACCOMPANY YOU HOME AND STAY WITH YOU FOR 24 HOURS.  Name and phone number of your driver:  Special Instructions: N/A              Please read over the following fact sheets you were given: _____________________________________________________________________  [facility] - Preparing for Surgery Before surgery, you can play an important role.  Because skin is not sterile, your skin needs to be as free of germs as possible.  You can reduce the number of germs on your skin by washing with CHG (chlorahexidine gluconate) soap before surgery.  CHG is an antiseptic cleaner which kills germs and bonds with the skin to continue killing germs even after washing. Please DO NOT use if you have an allergy to CHG or antibacterial soaps.  If your skin becomes reddened/irritated stop using the CHG and inform your nurse when you arrive at Short Stay. Do not shave (including legs and underarms) for at least 48 hours prior to the first CHG shower.  You may shave your face/neck. Please follow these instructions carefully:  1.  Shower with CHG Soap the night before surgery and the  morning of Surgery.  2.  If you choose to wash your hair, wash your hair first as usual with your  normal  shampoo.  3.  After you shampoo, rinse your hair and body thoroughly to remove the  shampoo.                           4.  Use CHG as you would any other liquid soap.  You can apply chg directly  to the skin and wash  Gently with a scrungie or clean washcloth.  5.  Apply the CHG Soap to your body ONLY FROM THE NECK DOWN.   Do not use  on face/ open                           Wound or open sores. Avoid contact with eyes, ears mouth and genitals (private parts).                       Wash face,  Genitals (private parts) with your normal soap.             6.  Wash thoroughly, paying special attention to the area where your surgery  will be performed.  7.  Thoroughly rinse your body with warm water from the neck down.  8.  DO NOT shower/wash with your normal soap after using and rinsing off  the CHG Soap.                9.  Pat yourself dry with a clean towel.            10.  Wear clean pajamas.            11.  Place clean sheets on your bed the night of your first shower and do not  sleep with pets. Day of Surgery : Do not apply any lotions/deodorants the morning of surgery.  Please wear clean clothes to the hospital/surgery center.  FAILURE TO FOLLOW THESE INSTRUCTIONS MAY RESULT IN THE CANCELLATION OF YOUR SURGERY PATIENT SIGNATURE_________________________________  NURSE SIGNATURE__________________________________  ________________________________________________________________________             __________________________________________________________________                  DUE TO COVID-19 ONLY ONE VISITOR IS ALLOWED TO COME WITH YOU AND STAY IN THE WAITING ROOM ONLY DURING PRE OP AND PROCEDURE DAY OF SURGERY. THE 1 VISITOR  MAY VISIT WITH YOU AFTER SURGERY IN YOUR PRIVATE ROOM DURING VISITING HOURS ONLY!  YOU NEED TO HAVE A COVID 19 TEST ON_______ @_______ , THIS TEST MUST BE DONE BEFORE SURGERY,  COVID TESTING SITE [address] [place] [address], IT IS ON THE RIGHT GOING OUT [address] APPROXIMATELY  2 MINUTES PAST [employer] ON THE RIGHT. ONCE YOUR COVID TEST IS COMPLETED,  PLEASE BEGIN THE QUARANTINE INSTRUCTIONS AS OUTLINED IN YOUR HANDOUT.                Daniel [name]  02/18/2020   Your procedure is scheduled on:    Report to [facility] Main  Entrance   Report  to admitting at AM     Call this number if you have problems the morning of surgery [phone]    Remember: Do not eat food , candy gum or mints :After Midnight. You may have clear liquids from midnight until     CLEAR LIQUID DIET   Foods Allowed                                                                       Coffee and tea, regular and decaf  Plain Jell-O any favor except red or purple                                            Fruit ices (not with fruit pulp)                                      Iced Popsicles                                     Carbonated beverages, regular and diet                                    Cranberry, grape and apple juices Sports drinks like Gatorade Lightly seasoned clear broth or consume(fat free) Sugar, honey syrup   _____________________________________________________________________    BRUSH YOUR TEETH MORNING OF SURGERY AND RINSE YOUR MOUTH OUT, NO CHEWING GUM CANDY OR MINTS.     Take these medicines the morning of surgery with A SIP OF WATER:   DO NOT TAKE ANY DIABETIC MEDICATIONS DAY OF YOUR SURGERY                               You may not have any metal on your body including hair pins and              piercings  Do not wear jewelry, make-up, lotions, powders or perfumes, deodorant             Do not wear nail polish on your fingernails.  Do not shave  48 hours prior to surgery.              Men may shave face and neck.   Do not bring valuables to the hospital. [place].  Contacts, dentures or bridgework may not be worn into surgery.  Leave suitcase in the car. After surgery it may be brought to your room.     Patients discharged the day of surgery will not be allowed to drive home. IF YOU ARE HAVING SURGERY AND GOING HOME THE SAME DAY, YOU MUST HAVE AN ADULT TO DRIVE YOU HOME AND BE WITH YOU FOR 24 HOURS. YOU MAY GO HOME BY TAXI OR UBER OR  ORTHERWISE, BUT AN ADULT MUST ACCOMPANY YOU HOME AND STAY WITH YOU FOR 24 HOURS.  Name and phone number of your driver:  Special Instructions: N/A              Please read over the following fact sheets you were given: _____________________________________________________________________  [facility] - Preparing for Surgery Before surgery, you can play an important role.  Because skin is not sterile, your skin needs to be as free of germs as possible.  You can reduce the number of germs on your skin by washing with CHG (chlorahexidine gluconate) soap before surgery.  CHG is an antiseptic cleaner which kills germs and bonds with the skin to continue killing germs even after washing. Please DO NOT use if you have  an allergy to CHG or antibacterial soaps.  If your skin becomes reddened/irritated stop using the CHG and inform your nurse when you arrive at Short Stay. Do not shave (including legs and underarms) for at least 48 hours prior to the first CHG shower.  You may shave your face/neck. Please follow these instructions carefully:  1.  Shower with CHG Soap the night before surgery and the  morning of Surgery.  2.  If you choose to wash your hair, wash your hair first as usual with your  normal  shampoo.  3.  After you shampoo, rinse your hair and body thoroughly to remove the  shampoo.                           4.  Use CHG as you would any other liquid soap.  You can apply chg directly  to the skin and wash                       Gently with a scrungie or clean washcloth.  5.  Apply the CHG Soap to your body ONLY FROM THE NECK DOWN.   Do not use on face/ open                           Wound or open sores. Avoid contact with eyes, ears mouth and genitals (private parts).                       Wash face,  Genitals (private parts) with your normal soap.             6.  Wash thoroughly, paying special attention to the area where your surgery  will be performed.  7.  Thoroughly rinse your body with  warm water from the neck down.  8.  DO NOT shower/wash with your normal soap after using and rinsing off  the CHG Soap.                9.  Pat yourself dry with a clean towel.            10.  Wear clean pajamas.            11.  Place clean sheets on your bed the night of your first shower and do not  sleep with pets. Day of Surgery : Do not apply any lotions/deodorants the morning of surgery.  Please wear clean clothes to the hospital/surgery center.  FAILURE TO FOLLOW THESE INSTRUCTIONS MAY RESULT IN THE CANCELLATION OF YOUR SURGERY PATIENT SIGNATURE_________________________________  NURSE SIGNATURE__________________________________  ________________________________________________________________________            DUE TO COVID-19 ONLY ONE VISITOR IS ALLOWED TO COME WITH YOU AND STAY IN THE WAITING ROOM ONLY DURING PRE OP AND PROCEDURE DAY OF SURGERY. THE 1 VISITOR  MAY VISIT WITH YOU AFTER SURGERY IN YOUR PRIVATE ROOM DURING VISITING HOURS ONLY!  YOU NEED TO HAVE A COVID 19 TEST ON_______ @_______ , THIS TEST MUST BE DONE BEFORE SURGERY,  COVID TESTING SITE [address] [place] [address], IT IS ON THE RIGHT GOING OUT [address] APPROXIMATELY  2 MINUTES PAST [employer] ON THE RIGHT. ONCE YOUR COVID TEST IS COMPLETED,  PLEASE BEGIN THE QUARANTINE INSTRUCTIONS AS OUTLINED IN YOUR HANDOUT.  Daniel [name]  02/18/2020   Your procedure is scheduled on:    Report to [facility] Main  Entrance   Report to admitting at AM     Call this number if you have problems the morning of surgery [phone]    Remember: Do not eat food , candy gum or mints :After Midnight. You may have clear liquids from midnight until     CLEAR LIQUID DIET   Foods Allowed                                                                       Coffee and tea, regular and decaf                              Plain Jell-O any favor except red or purple                                             Fruit ices (not with fruit pulp)                                      Iced Popsicles                                     Carbonated beverages, regular and diet                                    Cranberry, grape and apple juices Sports drinks like Gatorade Lightly seasoned clear broth or consume(fat free) Sugar, honey syrup   _____________________________________________________________________    BRUSH YOUR TEETH MORNING OF SURGERY AND RINSE YOUR MOUTH OUT, NO CHEWING GUM CANDY OR MINTS.     Take these medicines the morning of surgery with A SIP OF WATER:   DO NOT TAKE ANY DIABETIC MEDICATIONS DAY OF YOUR SURGERY                               You may not have any metal on your body including hair pins and              piercings  Do not wear jewelry, make-up, lotions, powders or perfumes, deodorant             Do not wear nail polish on your fingernails.  Do not shave  48 hours prior to surgery.              Men may shave face and neck.   Do not bring valuables to the hospital. [place].  Contacts, dentures or bridgework may not be worn into surgery.  Leave suitcase in the car. After surgery it may be brought to your  room.     Patients discharged the day of surgery will not be allowed to drive home. IF YOU ARE HAVING SURGERY AND GOING HOME THE SAME DAY, YOU MUST HAVE AN ADULT TO DRIVE YOU HOME AND BE WITH YOU FOR 24 HOURS. YOU MAY GO HOME BY TAXI OR UBER OR ORTHERWISE, BUT AN ADULT MUST ACCOMPANY YOU HOME AND STAY WITH YOU FOR 24 HOURS.  Name and phone number of your driver:  Special Instructions: N/A              Please read over the following fact sheets you were given: _____________________________________________________________________  [facility] - Preparing for Surgery Before surgery, you can play an important role.  Because skin is not sterile, your skin needs to be as free of germs as  possible.  You can reduce the number of germs on your skin by washing with CHG (chlorahexidine gluconate) soap before surgery.  CHG is an antiseptic cleaner which kills germs and bonds with the skin to continue killing germs even after washing. Please DO NOT use if you have an allergy to CHG or antibacterial soaps.  If your skin becomes reddened/irritated stop using the CHG and inform your nurse when you arrive at Short Stay. Do not shave (including legs and underarms) for at least 48 hours prior to the first CHG shower.  You may shave your face/neck. Please follow these instructions carefully:  1.  Shower with CHG Soap the night before surgery and the  morning of Surgery.  2.  If you choose to wash your hair, wash your hair first as usual with your  normal  shampoo.  3.  After you shampoo, rinse your hair and body thoroughly to remove the  shampoo.                           4.  Use CHG as you would any other liquid soap.  You can apply chg directly  to the skin and wash                       Gently with a scrungie or clean washcloth.  5.  Apply the CHG Soap to your body ONLY FROM THE NECK DOWN.   Do not use on face/ open                           Wound or open sores. Avoid contact with eyes, ears mouth and genitals (private parts).                       Wash face,  Genitals (private parts) with your normal soap.             6.  Wash thoroughly, paying special attention to the area where your surgery  will be performed.  7.  Thoroughly rinse your body with warm water from the neck down.  8.  DO NOT shower/wash with your normal soap after using and rinsing off  the CHG Soap.                9.  Pat yourself dry with a clean towel.            10.  Wear clean pajamas.            11.  Place clean sheets on your bed the night of your first shower and do  not  sleep with pets. Day of Surgery : Do not apply any lotions/deodorants the morning of surgery.  Please wear clean clothes to the hospital/surgery  center.  FAILURE TO FOLLOW THESE INSTRUCTIONS MAY RESULT IN THE CANCELLATION OF YOUR SURGERY PATIENT SIGNATURE_________________________________  NURSE SIGNATURE__________________________________  ________________________________________________________________________            DUE TO COVID-19 ONLY ONE VISITOR IS ALLOWED TO COME WITH YOU AND STAY IN THE WAITING ROOM ONLY DURING PRE OP AND PROCEDURE DAY OF SURGERY. THE 1 VISITOR  MAY VISIT WITH YOU AFTER SURGERY IN YOUR PRIVATE ROOM DURING VISITING HOURS ONLY!  YOU NEED TO HAVE A COVID 19 TEST ON_______ @_______ , THIS TEST MUST BE DONE BEFORE SURGERY,  COVID TESTING [address]ITE [address] [place] [address], IT IS ON THE RIGHT GOING OUT [address] APPROXIMATELY  2 MINUTES PAST [employer] ON THE RIGHT. ONCE YOUR COVID TEST IS COMPLETED,  PLEASE BEGIN THE QUARANTINE INSTRUCTIONS AS OUTLINED IN YOUR HANDOUT.                Daniel [name]  02/18/2020   Your procedure is scheduled on:    Report to [facility] Main  Entrance   Report to admitting at AM     Call this number if you have problems the morning of surgery [phone]    Remember: Do not eat food , candy gum or mints :After Midnight. You may have clear liquids from midnight until     CLEAR LIQUID DIET   Foods Allowed                                                                       Coffee and tea, regular and decaf                              Plain Jell-O any favor except red or purple                                            Fruit ices (not with fruit pulp)                                      Iced Popsicles                                     Carbonated beverages, regular and diet                                    Cranberry, grape and apple juices Sports drinks like Gatorade Lightly seasoned clear broth or consume(fat free) Sugar, honey syrup   _____________________________________________________________________    BRUSH  YOUR TEETH MORNING OF SURGERY AND RINSE YOUR MOUTH OUT, NO CHEWING GUM CANDY OR MINTS.     Take these medicines the morning of surgery with A SIP  OF WATER:   DO NOT TAKE ANY DIABETIC MEDICATIONS DAY OF YOUR SURGERY                               You may not have any metal on your body including hair pins and              piercings  Do not wear jewelry, make-up, lotions, powders or perfumes, deodorant             Do not wear nail polish on your fingernails.  Do not shave  48 hours prior to surgery.              Men may shave face and neck.   Do not bring valuables to the hospital. [place].  Contacts, dentures or bridgework may not be worn into surgery.  Leave suitcase in the car. After surgery it may be brought to your room.     Patients discharged the day of surgery will not be allowed to drive home. IF YOU ARE HAVING SURGERY AND GOING HOME THE SAME DAY, YOU MUST HAVE AN ADULT TO DRIVE YOU HOME AND BE WITH YOU FOR 24 HOURS. YOU MAY GO HOME BY TAXI OR UBER OR ORTHERWISE, BUT AN ADULT MUST ACCOMPANY YOU HOME AND STAY WITH YOU FOR 24 HOURS.  Name and phone number of your driver:  Special Instructions: N/A              Please read over the following fact sheets you were given: _____________________________________________________________________  [facility] - Preparing for Surgery Before surgery, you can play an important role.  Because skin is not sterile, your skin needs to be as free of germs as possible.  You can reduce the number of germs on your skin by washing with CHG (chlorahexidine gluconate) soap before surgery.  CHG is an antiseptic cleaner which kills germs and bonds with the skin to continue killing germs even after washing. Please DO NOT use if you have an allergy to CHG or antibacterial soaps.  If your skin becomes reddened/irritated stop using the CHG and inform your nurse when you arrive at Short Stay. Do not shave  (including legs and underarms) for at least 48 hours prior to the first CHG shower.  You may shave your face/neck. Please follow these instructions carefully:  1.  Shower with CHG Soap the night before surgery and the  morning of Surgery.  2.  If you choose to wash your hair, wash your hair first as usual with your  normal  shampoo.  3.  After you shampoo, rinse your hair and body thoroughly to remove the  shampoo.                           4.  Use CHG as you would any other liquid soap.  You can apply chg directly  to the skin and wash                       Gently with a scrungie or clean washcloth.  5.  Apply the CHG Soap to your body ONLY FROM THE NECK DOWN.   Do not use on face/ open  Wound or open sores. Avoid contact with eyes, ears mouth and genitals (private parts).                       Wash face,  Genitals (private parts) with your normal soap.             6.  Wash thoroughly, paying special attention to the area where your surgery  will be performed.  7.  Thoroughly rinse your body with warm water from the neck down.  8.  DO NOT shower/wash with your normal soap after using and rinsing off  the CHG Soap.                9.  Pat yourself dry with a clean towel.            10.  Wear clean pajamas.            11.  Place clean sheets on your bed the night of your first shower and do not  sleep with pets. Day of Surgery : Do not apply any lotions/deodorants the morning of surgery.  Please wear clean clothes to the hospital/surgery center.  FAILURE TO FOLLOW THESE INSTRUCTIONS MAY RESULT IN THE CANCELLATION OF YOUR SURGERY PATIENT SIGNATURE_________________________________  NURSE SIGNATURE__________________________________  ________________________________________________________________________

## 2020-02-18 NOTE — Progress Notes (Signed)
pcr screen done 02/18/20 faxed via epic to DR Kathrynn Speed

## 2020-02-18 NOTE — Progress Notes (Signed)
CBC done 02/18/20 routed via epic to Dr Zollie Beckers.

## 2020-02-18 NOTE — Progress Notes (Signed)
DUE TO COVID-19 ONLY ONE VISITOR IS ALLOWED TO COME WITH YOU AND STAY IN THE WAITING ROOM ONLY DURING PRE OP AND PROCEDURE DAY OF SURGERY. THE 1 VISITOR  MAY VISIT WITH YOU AFTER SURGERY IN YOUR PRIVATE ROOM DURING VISITING HOURS ONLY!  YOU NEED TO HAVE A COVID 19 TEST ON_______ @_______ , THIS TEST MUST BE DONE BEFORE SURGERY,  COVID TESTING SITE [address] [place] [address], IT IS ON THE RIGHT GOING OUT [address] APPROXIMATELY  2 MINUTES PAST [employer] ON THE RIGHT. ONCE YOUR COVID TEST IS COMPLETED,  PLEASE BEGIN THE QUARANTINE INSTRUCTIONS AS OUTLINED IN YOUR HANDOUT.                OSWELL SAY  02/18/2020   Your procedure is scheduled on:  02/26/2020   Report to [facility] Main  Entrance   Report to admitting at     0830am AM     Call this number if you have problems the morning of surgery [phone]    Remember: Do not eat food or drink liquids :After Midnight. BRUSH YOUR TEETH MORNING OF SURGERY AND RINSE YOUR MOUTH OUT, NO CHEWING GUM CANDY OR MINTS.     Take these medicines the morning of surgery with A SIP OF WATER:   DO NOT TAKE ANY DIABETIC MEDICATIONS DAY OF YOUR SURGERY                               You may not have any metal on your body including hair pins and              piercings  Do not wear jewelry, make-up, lotions, powders or perfumes, deodorant             Do not wear nail polish on your fingernails.  Do not shave  48 hours prior to surgery.              Men may shave face and neck.   Do not bring valuables to the hospital. [place].  Contacts, dentures or bridgework may not be worn into surgery.  Leave suitcase in the car. After surgery it may be brought to your room.     Patients discharged the day of surgery will not be allowed to drive home. IF YOU ARE HAVING SURGERY AND GOING HOME THE SAME DAY, YOU MUST HAVE AN ADULT TO DRIVE YOU HOME AND BE WITH YOU FOR 24  HOURS. YOU MAY GO HOME BY TAXI OR UBER OR ORTHERWISE, BUT AN ADULT MUST ACCOMPANY YOU HOME AND STAY WITH YOU FOR 24 HOURS.  Name and phone number of your driver:  Special Instructions: N/A              Please read over the following fact sheets you were given: _____________________________________________________________________             [facility] - Preparing for Surgery Before surgery, you can play an important role.  Because skin is not sterile, your skin needs to be as free of germs as possible.  You can reduce the number of germs on your skin by washing with CHG (chlorahexidine gluconate) soap before surgery.  CHG is an antiseptic cleaner which kills germs and bonds with the skin to continue killing germs even after washing. Please DO  NOT use if you have an allergy to CHG or antibacterial soaps.  If your skin becomes reddened/irritated stop using the CHG and inform your nurse when you arrive at Short Stay. Do not shave (including legs and underarms) for at least 48 hours prior to the first CHG shower.  You may shave your face/neck. Please follow these instructions carefully:  1.  Shower with CHG Soap the night before surgery and the  morning of Surgery.  2.  If you choose to wash your hair, wash your hair first as usual with your  normal  shampoo.  3.  After you shampoo, rinse your hair and body thoroughly to remove the  shampoo.                           4.  Use CHG as you would any other liquid soap.  You can apply chg directly  to the skin and wash                       Gently with a scrungie or clean washcloth.  5.  Apply the CHG Soap to your body ONLY FROM THE NECK DOWN.   Do not use on face/ open                           Wound or open sores. Avoid contact with eyes, ears mouth and genitals (private parts).                       Wash face,  Genitals (private parts) with your normal soap.             6.  Wash thoroughly, paying special attention to the area where your surgery   will be performed.  7.  Thoroughly rinse your body with warm water from the neck down.  8.  DO NOT shower/wash with your normal soap after using and rinsing off  the CHG Soap.                9.  Pat yourself dry with a clean towel.            10.  Wear clean pajamas.            11.  Place clean sheets on your bed the night of your first shower and do not  sleep with pets. Day of Surgery : Do not apply any lotions/deodorants the morning of surgery.  Please wear clean clothes to the hospital/surgery center.  FAILURE TO FOLLOW THESE INSTRUCTIONS MAY RESULT IN THE CANCELLATION OF YOUR SURGERY PATIENT SIGNATURE_________________________________  NURSE SIGNATURE__________________________________  ________________________________________________________________________

## 2020-02-18 NOTE — Progress Notes (Addendum)
Anesthesia Review:  PCP: DR Cindee Lame - LOV- 01/25/20 - clearance for surgery  Cardiologist : 2.5 years ago followed by Dr Dorris Carnes for " fast heart beat" no followup since per pt - LOV in epic - 06/13/2015 office visit with DR Dorris Carnes  Chest x-ray : EKG :02/18/2020 - final  Echo : Stress test: Cardiac Cath :  Activity level: can do a flight of stairs without difficulty  Sleep Study/ CPAP :no Fasting Blood Sugar :      / Checks Blood Sugar -- times a day:   Blood Thinner/ Instructions /Last Dose: ASA / Instructions/ Last Dose :  HGBA1c-7.4 in office visit note dated 01/25/20 with Dr Nyoka Cowden  Cbc routed via epic to [place].

## 2020-02-23 ENCOUNTER — Other Ambulatory Visit (HOSPITAL_COMMUNITY)
Admission: RE | Admit: 2020-02-23 | Discharge: 2020-02-23 | Disposition: A | Discharge status: Home / Self Care | Payer: Medicare Other | Source: Ambulatory Visit | Attending: Orthopaedic Surgery | Admitting: Orthopaedic Surgery

## 2020-02-23 ENCOUNTER — Other Ambulatory Visit: Payer: Self-pay | Admitting: Physician Assistant

## 2020-02-23 DIAGNOSIS — Z20822 Contact with and (suspected) exposure to covid-19: Secondary | ICD-10-CM | POA: Insufficient documentation

## 2020-02-23 DIAGNOSIS — Z01812 Encounter for preprocedural laboratory examination: Secondary | ICD-10-CM | POA: Diagnosis not present

## 2020-02-23 LAB — SARS CORONAVIRUS 2 (TAT 6-24 HRS): SARS Coronavirus 2: NEGATIVE

## 2020-02-24 ENCOUNTER — Telehealth: Payer: Self-pay | Admitting: *Deleted

## 2020-02-24 NOTE — Care Plan (Signed)
RNCM call to patient to discuss his upcoming Left total knee arthroplasty on 02/26/20 with Dr. Ninfa Linden. Patient is an Ortho bundle patient through [facility] and is agreeable to case management. Patient has a FWW and declines (for now) a 3in1/BSC. He has family that will be able to assist at home after discharge. Anticipate HHPT will be needed after a short hospital stay. Choice provided and referral made to [facility]. Reviewed all post-op instructions. Will continue to follow for all needs.

## 2020-02-24 NOTE — Telephone Encounter (Signed)
Ortho bundle pre-op call completed. 

## 2020-02-24 NOTE — Progress Notes (Signed)
AT preop appt patient was instructed not to take Victoza day before surgery. When reviewing chart and diabetic protocol realized pt should take usual dose of Victoza day before surgery.  Called pt and informed pt to take usual dose of Victoza day before surgery and no diabetic meds am of surgery.  Patient voiced understanding.

## 2020-02-25 NOTE — H&P (Signed)
TOTAL KNEE ADMISSION H&P  Patient is being admitted for left total knee arthroplasty.  Subjective:  Chief Complaint:left knee pain.  HPI: Daniel [name], 69 y.o. male, has a history of pain and functional disability in the left knee due to arthritis and has failed non-surgical conservative treatments for greater than 12 weeks to includeNSAID's and/or analgesics, corticosteriod injections, viscosupplementation injections, flexibility and strengthening excercises, use of assistive devices, weight reduction as appropriate and activity modification.  Onset of symptoms was gradual, starting 3 years ago with gradually worsening course since that time. The patient noted no past surgery on the left knee(s).  Patient currently rates pain in the left knee(s) at 10 out of 10 with activity. Patient has night pain, worsening of pain with activity and weight bearing, pain that interferes with activities of daily living, pain with passive range of motion, crepitus and joint swelling.  Patient has evidence of subchondral sclerosis, periarticular osteophytes and joint space narrowing by imaging studies. There is no active infection.  Patient Active Problem List   Diagnosis Date Noted   Trigger finger, left little finger 02/04/2020   Carpal tunnel syndrome, left upper limb 11/24/2019   Unilateral primary osteoarthritis, left knee 11/02/2019   Unilateral primary osteoarthritis, right knee 11/02/2019   Carpal tunnel syndrome, right upper limb 02/18/2018   Anxiety and depression 10/28/2016   Gastroesophageal reflux disease without esophagitis 10/28/2016   Seasonal allergic rhinitis due to pollen 10/28/2016   Primary osteoarthritis of left knee 07/25/2015   Essential hypertension 07/25/2015   Hypercholesteremia 07/25/2015   Type 2 diabetes mellitus with diabetic polyneuropathy, without long-term current use of insulin ([place]) 07/25/2015   Past Medical History:  Diagnosis Date   Allergic rhinitis     Allergy    Anxiety    Arthritis    Benign hypertension    Cataract    Depression    Diabetes mellitus ([place])    type 2    GERD (gastroesophageal reflux disease)    History of pneumonia    HLD (hyperlipidemia)    Neuromuscular disorder ([place])    pt denies at 8/19/visit    Pneumonia    hx of several times per pt - last episode 4-5 years ago     Past Surgical History:  Procedure Laterality Date   CARPAL TUNNEL RELEASE Bilateral 2020 Right, 2021 left   CATARACT EXTRACTION     bilateral   EYE SURGERY     KNEE SURGERY     right    No current facility-administered medications for this encounter.   Current Outpatient Medications  Medication Sig Dispense Refill Last Dose   aspirin 81 MG tablet Take 81 mg by mouth daily.      cetirizine (ZYRTEC) 10 MG tablet Take 10 mg by mouth daily as needed for allergies.       Cholecalciferol (VITAMIN D) 125 MCG (5000 UT) CAPS Take 5,000 Units by mouth daily.      Cyanocobalamin (VITAMIN B 12 PO) Take 1,000 mg by mouth daily.       diclofenac Sodium (VOLTAREN) 1 % GEL Apply 2 g topically daily.      fluticasone (FLONASE) 50 MCG/ACT nasal spray Place 1 spray into both nostrils daily.      gabapentin (NEURONTIN) 300 MG capsule Take 1-2 capsules (300-600 mg total) by mouth 2 (two) times daily as needed. (Patient taking differently: Take 300 mg by mouth 2 (two) times daily. ) 180 capsule 3    glimepiride (AMARYL) 4 MG tablet Take 1  tablet (4 mg total) by mouth 2 (two) times daily. (Patient taking differently: Take 4 mg by mouth daily with breakfast. ) 180 tablet 0    ibuprofen (ADVIL,MOTRIN) 200 MG tablet Take 400 mg by mouth in the morning and at bedtime.       liraglutide (VICTOZA) 18 MG/3ML SOPN Inject 0.3 mLs (1.8 mg total) into the skin daily. I 3 mL 5    lisinopril (ZESTRIL) 20 MG tablet Take 1 tablet (20 mg total) by mouth daily. 90 tablet 3    metFORMIN (GLUCOPHAGE) 1000 MG tablet TAKE 1 TABLET BY MOUTH TWICE DAILY WITH  A MEAL (Patient taking differently: Take 1,000 mg by mouth 2 (two) times daily with a meal. ) 180 tablet 0    montelukast (SINGULAIR) 10 MG tablet Take 1 tablet (10 mg total) by mouth at bedtime. 90 tablet 1    Multiple Vitamin (MULTIVITAMIN WITH MINERALS) TABS tablet Take 1 tablet by mouth daily.      omeprazole (PRILOSEC) 20 MG capsule Take 1 capsule (20 mg total) by mouth daily. 90 capsule 3    pravastatin (PRAVACHOL) 80 MG tablet Take 1 tablet (80 mg total) by mouth daily. 90 tablet 1    sertraline (ZOLOFT) 100 MG tablet Take 2 tablets (200 mg total) by mouth daily. Start with one per day for initial 2 weeks, then increase to 2 per day. (Patient taking differently: Take 100-200 mg by mouth daily. ) 180 tablet 1    sodium chloride (OCEAN) 0.65 % SOLN nasal spray Place 1 spray into both nostrils as needed for congestion.      traMADol (ULTRAM) 50 MG tablet TAKE 1 TABLET BY MOUTH EVERY 6 HOURS AS NEEDED (Patient taking differently: Take 50-100 mg by mouth every 6 (six) hours as needed for moderate pain. ) 120 tablet 0    vitamin E 180 MG (400 UNITS) capsule Take 400 Units by mouth daily.      albuterol (VENTOLIN HFA) 108 (90 Base) MCG/ACT inhaler Inhale 1-2 puffs into the lungs every 4 (four) hours as needed for wheezing or shortness of breath. Reported on 10/19/2015 (Patient not taking: Reported on 02/11/2020) 18 g 0 Not Taking at Unknown time   blood glucose meter kit and supplies 1 each by Other route as directed. Dispense based on patient and insurance preference. Check once per day (variable times).  Accu check. 1 each 0    glucose blood (ONE TOUCH ULTRA TEST) test strip Test Twice a day as directed      glucose blood test strip check sugars once daily. Dx: E11.42 100 each 3    HYDROcodone-acetaminophen (NORCO/VICODIN) 5-325 MG tablet Take 1 tablet by mouth every 6 (six) hours as needed for moderate pain. (Patient not taking: Reported on 02/11/2020) 20 tablet 0 Not Taking at Unknown  time   Lancets (ONETOUCH DELICA PLUS [id number]) MISC USE 1 TO CHECK GLUCOSE ONCE DAILY      ONETOUCH ULTRA test strip USE 1 STRIP TO CHECK GLUCOSE ONCE DAILY 100 each 1    Allergies  Allergen Reactions   Actos [Pioglitazone] Other (See Comments), Shortness Of Breath and Swelling    Blood sugar went up and down and sweating profusely   Codeine Shortness Of Breath and Palpitations    Social History   Tobacco Use   Smoking status: Never Smoker   Smokeless tobacco: Never Used  Substance Use Topics   Alcohol use: No    Family History  Problem Relation Age of Onset  Diabetes Father    Colon polyps Father    Atrial fibrillation Mother    Prostate cancer Paternal Uncle    Cancer Sister    Esophageal cancer Neg Hx    Rectal cancer Neg Hx    Stomach cancer Neg Hx      Review of Systems  Musculoskeletal: Positive for joint swelling.  All other systems reviewed and are negative.   Objective:  Physical Exam Vitals reviewed.  Constitutional:      Appearance: Normal appearance.  HENT:     Head: Normocephalic and atraumatic.  Eyes:     Extraocular Movements: Extraocular movements intact.     Pupils: Pupils are equal, round, and reactive to light.  Cardiovascular:     Rate and Rhythm: Normal rate.     Pulses: Normal pulses.  Pulmonary:     Effort: Pulmonary effort is normal.  Abdominal:     Palpations: Abdomen is soft.  Musculoskeletal:     Cervical back: Normal range of motion.     Left knee: Effusion and bony tenderness present. Decreased range of motion. Tenderness present over the medial joint line. Abnormal alignment.  Neurological:     Mental Status: He is alert and oriented to person, place, and time.  Psychiatric:        Behavior: Behavior normal.     Vital signs in last 24 hours:    Labs:   Estimated body mass index is 35.73 kg/m as calculated from the following:   Height as of 02/18/20: '5\' 8"'  (1.727 m).   Weight as of 02/18/20: 106.6  kg.   Imaging Review Plain radiographs demonstrate severe degenerative joint disease of the left knee(s). The overall alignment ismild varus. The bone quality appears to be good for age and reported activity level.      Assessment/Plan:  End stage arthritis, left knee   The patient history, physical examination, clinical judgment of the provider and imaging studies are consistent with end stage degenerative joint disease of the left knee(s) and total knee arthroplasty is deemed medically necessary. The treatment options including medical management, injection therapy arthroscopy and arthroplasty were discussed at length. The risks and benefits of total knee arthroplasty were presented and reviewed. The risks due to aseptic loosening, infection, stiffness, patella tracking problems, thromboembolic complications and other imponderables were discussed. The patient acknowledged the explanation, agreed to proceed with the plan and consent was signed. Patient is being admitted for inpatient treatment for surgery, pain control, PT, OT, prophylactic antibiotics, VTE prophylaxis, progressive ambulation and ADL's and discharge planning. The patient is planning to be discharged home with home health services

## 2020-02-26 ENCOUNTER — Observation Stay (HOSPITAL_COMMUNITY): Payer: Medicare Other

## 2020-02-26 ENCOUNTER — Ambulatory Visit (HOSPITAL_COMMUNITY): Payer: Medicare Other | Admitting: Anesthesiology

## 2020-02-26 ENCOUNTER — Encounter (HOSPITAL_COMMUNITY): Payer: Self-pay | Admitting: Orthopaedic Surgery

## 2020-02-26 ENCOUNTER — Ambulatory Visit (HOSPITAL_COMMUNITY): Payer: Medicare Other | Admitting: Physician Assistant

## 2020-02-26 ENCOUNTER — Other Ambulatory Visit: Payer: Self-pay

## 2020-02-26 ENCOUNTER — Encounter (HOSPITAL_COMMUNITY)
Admission: RE | Disposition: A | Discharge status: Home / Self Care | Payer: Self-pay | Source: Home / Self Care | Attending: Orthopaedic Surgery

## 2020-02-26 ENCOUNTER — Observation Stay (HOSPITAL_COMMUNITY)
Admission: RE | Admit: 2020-02-26 | Discharge: 2020-02-27 | Disposition: A | Discharge status: Home / Self Care | Payer: Medicare Other | Attending: Orthopaedic Surgery | Admitting: Orthopaedic Surgery

## 2020-02-26 ENCOUNTER — Other Ambulatory Visit: Payer: Self-pay | Admitting: *Deleted

## 2020-02-26 DIAGNOSIS — Z9889 Other specified postprocedural states: Secondary | ICD-10-CM | POA: Diagnosis not present

## 2020-02-26 DIAGNOSIS — M1712 Unilateral primary osteoarthritis, left knee: Secondary | ICD-10-CM | POA: Diagnosis not present

## 2020-02-26 DIAGNOSIS — Z79899 Other long term (current) drug therapy: Secondary | ICD-10-CM | POA: Insufficient documentation

## 2020-02-26 DIAGNOSIS — E119 Type 2 diabetes mellitus without complications: Secondary | ICD-10-CM | POA: Insufficient documentation

## 2020-02-26 DIAGNOSIS — Z7984 Long term (current) use of oral hypoglycemic drugs: Secondary | ICD-10-CM | POA: Insufficient documentation

## 2020-02-26 DIAGNOSIS — F329 Major depressive disorder, single episode, unspecified: Secondary | ICD-10-CM | POA: Diagnosis not present

## 2020-02-26 DIAGNOSIS — Z96652 Presence of left artificial knee joint: Secondary | ICD-10-CM

## 2020-02-26 DIAGNOSIS — Z7982 Long term (current) use of aspirin: Secondary | ICD-10-CM | POA: Diagnosis not present

## 2020-02-26 DIAGNOSIS — E785 Hyperlipidemia, unspecified: Secondary | ICD-10-CM | POA: Diagnosis not present

## 2020-02-26 DIAGNOSIS — I10 Essential (primary) hypertension: Secondary | ICD-10-CM | POA: Insufficient documentation

## 2020-02-26 DIAGNOSIS — E7800 Pure hypercholesterolemia, unspecified: Secondary | ICD-10-CM | POA: Diagnosis not present

## 2020-02-26 DIAGNOSIS — Z471 Aftercare following joint replacement surgery: Secondary | ICD-10-CM | POA: Diagnosis not present

## 2020-02-26 DIAGNOSIS — G8918 Other acute postprocedural pain: Secondary | ICD-10-CM | POA: Diagnosis not present

## 2020-02-26 DIAGNOSIS — M25562 Pain in left knee: Secondary | ICD-10-CM | POA: Diagnosis present

## 2020-02-26 HISTORY — PX: TOTAL KNEE ARTHROPLASTY: SHX125

## 2020-02-26 LAB — GLUCOSE, CAPILLARY
Glucose-Capillary: 139 mg/dL — ABNORMAL HIGH (ref 70–99)
Glucose-Capillary: 98 mg/dL (ref 70–99)
Glucose-Capillary: 112 mg/dL — ABNORMAL HIGH (ref 70–99)
Glucose-Capillary: 142 mg/dL — ABNORMAL HIGH (ref 70–99)

## 2020-02-26 LAB — TYPE AND SCREEN
ABO/RH(D): O POS
Antibody Screen: NEGATIVE

## 2020-02-26 LAB — ABO/RH: ABO/RH(D): O POS

## 2020-02-26 SURGERY — ARTHROPLASTY, KNEE, TOTAL
Anesthesia: Regional | Site: Knee | Laterality: Left

## 2020-02-26 MED ORDER — DOCUSATE SODIUM 100 MG PO CAPS
100.0000 mg | ORAL_CAPSULE | Freq: Two times a day (BID) | ORAL | Status: DC
  Administered 2020-02-26 – 2020-02-27 (×2): 100 mg via ORAL
  Filled 2020-02-26 (×2): qty 1

## 2020-02-26 MED ORDER — MENTHOL 3 MG MT LOZG: 1.0000 | LOZENGE | OROMUCOSAL | Status: DC | PRN

## 2020-02-26 MED ORDER — OXYCODONE HCL 5 MG PO TABS
5.0000 mg | ORAL_TABLET | ORAL | Status: DC | PRN
  Administered 2020-02-26: 10 mg via ORAL
  Filled 2020-02-26 (×3): qty 2

## 2020-02-26 MED ORDER — GABAPENTIN 300 MG PO CAPS
300.0000 mg | ORAL_CAPSULE | Freq: Two times a day (BID) | ORAL | Status: DC
  Administered 2020-02-26 – 2020-02-27 (×2): 300 mg via ORAL
  Filled 2020-02-26 (×2): qty 1

## 2020-02-26 MED ORDER — METHOCARBAMOL 500 MG IVPB - SIMPLE MED
500.0000 mg | Freq: Four times a day (QID) | INTRAVENOUS | Status: DC | PRN
  Filled 2020-02-26: qty 50

## 2020-02-26 MED ORDER — METOCLOPRAMIDE HCL 5 MG PO TABS: 5.0000 mg | ORAL_TABLET | Freq: Three times a day (TID) | ORAL | Status: DC | PRN

## 2020-02-26 MED ORDER — LACTATED RINGERS IV SOLN: INTRAVENOUS | Status: DC

## 2020-02-26 MED ORDER — METFORMIN HCL 500 MG PO TABS
1000.0000 mg | ORAL_TABLET | Freq: Two times a day (BID) | ORAL | Status: DC
  Administered 2020-02-26 – 2020-02-27 (×2): 1000 mg via ORAL
  Filled 2020-02-26 (×2): qty 2

## 2020-02-26 MED ORDER — SERTRALINE HCL 100 MG PO TABS: 200.0000 mg | ORAL_TABLET | Freq: Every day | ORAL | Status: DC

## 2020-02-26 MED ORDER — VITAMIN D 25 MCG (1000 UNIT) PO TABS
5000.0000 [IU] | ORAL_TABLET | Freq: Every day | ORAL | Status: DC
  Administered 2020-02-27: 5000 [IU] via ORAL

## 2020-02-26 MED ORDER — SODIUM CHLORIDE 0.9 % IV SOLN: INTRAVENOUS | Status: DC

## 2020-02-26 MED ORDER — ADULT MULTIVITAMIN W/MINERALS CH
1.0000 | ORAL_TABLET | Freq: Every day | ORAL | Status: DC
  Administered 2020-02-27: 1 via ORAL
  Filled 2020-02-26: qty 1

## 2020-02-26 MED ORDER — CLONIDINE HCL (ANALGESIA) 100 MCG/ML EP SOLN
EPIDURAL | Status: DC | PRN
  Administered 2020-02-26: 100 ug

## 2020-02-26 MED ORDER — POLYETHYLENE GLYCOL 3350 17 G PO PACK: 17.0000 g | PACK | Freq: Every day | ORAL | Status: DC | PRN

## 2020-02-26 MED ORDER — SODIUM CHLORIDE 0.9 % IR SOLN
Status: DC | PRN
  Administered 2020-02-26: 1

## 2020-02-26 MED ORDER — ASPIRIN EC 325 MG PO TBEC
325.0000 mg | DELAYED_RELEASE_TABLET | Freq: Two times a day (BID) | ORAL | Status: DC
  Administered 2020-02-26 – 2020-02-27 (×2): 325 mg via ORAL
  Filled 2020-02-26 (×2): qty 1

## 2020-02-26 MED ORDER — FENTANYL CITRATE (PF) 100 MCG/2ML IJ SOLN: 25.0000 ug | INTRAMUSCULAR | Status: DC | PRN

## 2020-02-26 MED ORDER — SERTRALINE HCL 100 MG PO TABS
200.0000 mg | ORAL_TABLET | Freq: Every day | ORAL | Status: DC
  Administered 2020-02-26: 200 mg via ORAL
  Filled 2020-02-26: qty 2

## 2020-02-26 MED ORDER — 0.9 % SODIUM CHLORIDE (POUR BTL) OPTIME
TOPICAL | Status: DC | PRN
  Administered 2020-02-26: 1000 mL

## 2020-02-26 MED ORDER — PANTOPRAZOLE SODIUM 40 MG PO TBEC
40.0000 mg | DELAYED_RELEASE_TABLET | Freq: Every day | ORAL | Status: DC
  Administered 2020-02-26 – 2020-02-27 (×2): 40 mg via ORAL
  Filled 2020-02-26 (×2): qty 1

## 2020-02-26 MED ORDER — ACETAMINOPHEN 325 MG PO TABS
325.0000 mg | ORAL_TABLET | Freq: Four times a day (QID) | ORAL | Status: DC | PRN
  Administered 2020-02-27: 650 mg via ORAL
  Filled 2020-02-26: qty 2

## 2020-02-26 MED ORDER — ACETAMINOPHEN 500 MG PO TABS
1000.0000 mg | ORAL_TABLET | Freq: Once | ORAL | Status: AC
  Administered 2020-02-26: 1000 mg via ORAL
  Filled 2020-02-26: qty 2

## 2020-02-26 MED ORDER — BUPIVACAINE IN DEXTROSE 0.75-8.25 % IT SOLN
INTRATHECAL | Status: DC | PRN
  Administered 2020-02-26: 1.8 mL via INTRATHECAL

## 2020-02-26 MED ORDER — ORAL CARE MOUTH RINSE: 15.0000 mL | Freq: Once | OROMUCOSAL | Status: AC

## 2020-02-26 MED ORDER — METOCLOPRAMIDE HCL 5 MG/ML IJ SOLN
5.0000 mg | Freq: Three times a day (TID) | INTRAMUSCULAR | Status: DC | PRN
  Administered 2020-02-26: 10 mg via INTRAVENOUS
  Filled 2020-02-26: qty 2

## 2020-02-26 MED ORDER — INSULIN ASPART 100 UNIT/ML ~~LOC~~ SOLN
0.0000 [IU] | Freq: Three times a day (TID) | SUBCUTANEOUS | Status: DC
  Administered 2020-02-27: 3 [IU] via SUBCUTANEOUS

## 2020-02-26 MED ORDER — GLIMEPIRIDE 4 MG PO TABS
4.0000 mg | ORAL_TABLET | Freq: Every day | ORAL | Status: DC
  Administered 2020-02-27: 4 mg via ORAL
  Filled 2020-02-26: qty 1

## 2020-02-26 MED ORDER — POVIDONE-IODINE 10 % EX SWAB
2.0000 "application " | Freq: Once | CUTANEOUS | Status: AC
  Administered 2020-02-26: 2 via TOPICAL

## 2020-02-26 MED ORDER — ROPIVACAINE HCL 5 MG/ML IJ SOLN
INTRAMUSCULAR | Status: DC | PRN
  Administered 2020-02-26: 30 mL via PERINEURAL

## 2020-02-26 MED ORDER — PHENYLEPHRINE 40 MCG/ML (10ML) SYRINGE FOR IV PUSH (FOR BLOOD PRESSURE SUPPORT)
PREFILLED_SYRINGE | INTRAVENOUS | Status: DC | PRN
  Administered 2020-02-26: 80 ug via INTRAVENOUS
  Administered 2020-02-26 (×2): 120 ug via INTRAVENOUS

## 2020-02-26 MED ORDER — ONDANSETRON HCL 4 MG/2ML IJ SOLN: 4.0000 mg | Freq: Once | INTRAMUSCULAR | Status: DC | PRN

## 2020-02-26 MED ORDER — TRANEXAMIC ACID-NACL 1000-0.7 MG/100ML-% IV SOLN
1000.0000 mg | INTRAVENOUS | Status: AC
  Administered 2020-02-26: 1000 mg via INTRAVENOUS
  Filled 2020-02-26: qty 100

## 2020-02-26 MED ORDER — ONDANSETRON HCL 4 MG/2ML IJ SOLN: 4.0000 mg | Freq: Four times a day (QID) | INTRAMUSCULAR | Status: DC | PRN

## 2020-02-26 MED ORDER — ONDANSETRON HCL 4 MG/2ML IJ SOLN
INTRAMUSCULAR | Status: AC
  Filled 2020-02-26: qty 2

## 2020-02-26 MED ORDER — BUPIVACAINE-EPINEPHRINE 0.25% -1:200000 IJ SOLN
INTRAMUSCULAR | Status: AC
  Filled 2020-02-26: qty 1

## 2020-02-26 MED ORDER — FENTANYL CITRATE (PF) 100 MCG/2ML IJ SOLN
INTRAMUSCULAR | Status: AC
  Filled 2020-02-26: qty 2

## 2020-02-26 MED ORDER — PROPOFOL 500 MG/50ML IV EMUL
INTRAVENOUS | Status: DC | PRN
  Administered 2020-02-26: 50 ug/kg/min via INTRAVENOUS

## 2020-02-26 MED ORDER — FLUTICASONE PROPIONATE 50 MCG/ACT NA SUSP
1.0000 | Freq: Every day | NASAL | Status: DC
  Administered 2020-02-27: 1 via NASAL
  Filled 2020-02-26: qty 16

## 2020-02-26 MED ORDER — FENTANYL CITRATE (PF) 100 MCG/2ML IJ SOLN
50.0000 ug | Freq: Once | INTRAMUSCULAR | Status: DC
  Filled 2020-02-26: qty 2

## 2020-02-26 MED ORDER — ONDANSETRON HCL 4 MG/2ML IJ SOLN
INTRAMUSCULAR | Status: DC | PRN
  Administered 2020-02-26: 4 mg via INTRAVENOUS

## 2020-02-26 MED ORDER — ONDANSETRON HCL 4 MG PO TABS: 4.0000 mg | ORAL_TABLET | Freq: Four times a day (QID) | ORAL | Status: DC | PRN

## 2020-02-26 MED ORDER — PRAVASTATIN SODIUM 20 MG PO TABS
80.0000 mg | ORAL_TABLET | Freq: Every day | ORAL | Status: DC
  Administered 2020-02-26: 80 mg via ORAL
  Filled 2020-02-26: qty 4

## 2020-02-26 MED ORDER — KETOROLAC TROMETHAMINE 15 MG/ML IJ SOLN
15.0000 mg | Freq: Four times a day (QID) | INTRAMUSCULAR | Status: DC
  Administered 2020-02-26 – 2020-02-27 (×3): 15 mg via INTRAVENOUS
  Filled 2020-02-26 (×3): qty 1

## 2020-02-26 MED ORDER — INSULIN ASPART 100 UNIT/ML ~~LOC~~ SOLN: 0.0000 [IU] | Freq: Every day | SUBCUTANEOUS | Status: DC

## 2020-02-26 MED ORDER — FENTANYL CITRATE (PF) 100 MCG/2ML IJ SOLN
INTRAMUSCULAR | Status: DC | PRN
  Administered 2020-02-26 (×2): 12.5 ug via INTRAVENOUS

## 2020-02-26 MED ORDER — MONTELUKAST SODIUM 10 MG PO TABS
10.0000 mg | ORAL_TABLET | Freq: Every day | ORAL | Status: DC
  Administered 2020-02-26: 10 mg via ORAL
  Filled 2020-02-26: qty 1

## 2020-02-26 MED ORDER — LIRAGLUTIDE 18 MG/3ML ~~LOC~~ SOPN: 1.8000 mg | PEN_INJECTOR | Freq: Every day | SUBCUTANEOUS | Status: DC

## 2020-02-26 MED ORDER — OXYCODONE HCL 5 MG PO TABS
10.0000 mg | ORAL_TABLET | ORAL | Status: DC | PRN
  Administered 2020-02-26 – 2020-02-27 (×3): 10 mg via ORAL
  Filled 2020-02-26: qty 2

## 2020-02-26 MED ORDER — PRAVASTATIN SODIUM 20 MG PO TABS: 80.0000 mg | ORAL_TABLET | Freq: Every day | ORAL | Status: DC

## 2020-02-26 MED ORDER — DIPHENHYDRAMINE HCL 12.5 MG/5ML PO ELIX: 12.5000 mg | ORAL_SOLUTION | ORAL | Status: DC | PRN

## 2020-02-26 MED ORDER — ALUM & MAG HYDROXIDE-SIMETH 200-200-20 MG/5ML PO SUSP: 30.0000 mL | ORAL | Status: DC | PRN

## 2020-02-26 MED ORDER — CEFAZOLIN SODIUM-DEXTROSE 2-4 GM/100ML-% IV SOLN
2.0000 g | INTRAVENOUS | Status: AC
  Administered 2020-02-26: 2 g via INTRAVENOUS
  Filled 2020-02-26: qty 100

## 2020-02-26 MED ORDER — PHENOL 1.4 % MT LIQD: 1.0000 | OROMUCOSAL | Status: DC | PRN

## 2020-02-26 MED ORDER — CEFAZOLIN SODIUM-DEXTROSE 1-4 GM/50ML-% IV SOLN
1.0000 g | Freq: Four times a day (QID) | INTRAVENOUS | Status: AC
  Administered 2020-02-26 – 2020-02-27 (×2): 1 g via INTRAVENOUS
  Filled 2020-02-26 (×2): qty 50

## 2020-02-26 MED ORDER — METHOCARBAMOL 500 MG PO TABS
500.0000 mg | ORAL_TABLET | Freq: Four times a day (QID) | ORAL | Status: DC | PRN
  Administered 2020-02-26 – 2020-02-27 (×2): 500 mg via ORAL
  Filled 2020-02-26 (×3): qty 1

## 2020-02-26 MED ORDER — MIDAZOLAM HCL 2 MG/2ML IJ SOLN
1.0000 mg | INTRAMUSCULAR | Status: DC
  Administered 2020-02-26: 1 mg via INTRAVENOUS
  Filled 2020-02-26: qty 2

## 2020-02-26 MED ORDER — CHLORHEXIDINE GLUCONATE 0.12 % MT SOLN
15.0000 mL | Freq: Once | OROMUCOSAL | Status: AC
  Administered 2020-02-26: 15 mL via OROMUCOSAL

## 2020-02-26 MED ORDER — VITAMIN E 180 MG (400 UNIT) PO CAPS
400.0000 [IU] | ORAL_CAPSULE | Freq: Every day | ORAL | Status: DC
  Administered 2020-02-27: 400 [IU] via ORAL
  Filled 2020-02-26: qty 1

## 2020-02-26 MED ORDER — HYDROMORPHONE HCL 1 MG/ML IJ SOLN: 0.5000 mg | INTRAMUSCULAR | Status: DC | PRN

## 2020-02-26 MED ORDER — BUPIVACAINE-EPINEPHRINE 0.25% -1:200000 IJ SOLN
INTRAMUSCULAR | Status: DC | PRN
  Administered 2020-02-26: 50 mL

## 2020-02-26 MED ORDER — DEXAMETHASONE SODIUM PHOSPHATE 10 MG/ML IJ SOLN
INTRAMUSCULAR | Status: AC
  Filled 2020-02-26: qty 1

## 2020-02-26 SURGICAL SUPPLY — 55 items
BAG ZIPLOCK 12X15 (MISCELLANEOUS) ×2 IMPLANT
BENZOIN TINCTURE PRP APPL 2/3 (GAUZE/BANDAGES/DRESSINGS) IMPLANT
BLADE SAG 18X100X1.27 (BLADE) IMPLANT
BLADE SURG SZ10 CARB STEEL (BLADE) ×4 IMPLANT
BNDG ELASTIC 6X5.8 VLCR STR LF (GAUZE/BANDAGES/DRESSINGS) ×4 IMPLANT
BOWL SMART MIX CTS (DISPOSABLE) IMPLANT
CEMENT BONE SIMPLEX SPEEDSET (Cement) ×4 IMPLANT
COMP POSTERIOR FEMORAL SZ4 LFT (Femur) ×2 IMPLANT
COMPONENT PSTRER FEMRL SZ4 LT (Femur) ×1 IMPLANT
COVER SURGICAL LIGHT HANDLE (MISCELLANEOUS) ×2 IMPLANT
COVER WAND RF STERILE (DRAPES) IMPLANT
CUFF TOURN SGL QUICK 34 (TOURNIQUET CUFF) ×2
CUFF TRNQT CYL 34X4.125X (TOURNIQUET CUFF) ×1 IMPLANT
DECANTER SPIKE VIAL GLASS SM (MISCELLANEOUS) IMPLANT
DRAPE U-SHAPE 47X51 STRL (DRAPES) ×2 IMPLANT
DRSG PAD ABDOMINAL 8X10 ST (GAUZE/BANDAGES/DRESSINGS) ×4 IMPLANT
DURAPREP 26ML APPLICATOR (WOUND CARE) ×2 IMPLANT
ELECT BLADE TIP CTD 4 INCH (ELECTRODE) ×2 IMPLANT
ELECT REM PT RETURN 15FT ADLT (MISCELLANEOUS) ×2 IMPLANT
GAUZE SPONGE 4X4 12PLY STRL (GAUZE/BANDAGES/DRESSINGS) ×2 IMPLANT
GAUZE XEROFORM 1X8 LF (GAUZE/BANDAGES/DRESSINGS) ×2 IMPLANT
GLOVE BIO SURGEON STRL SZ7.5 (GLOVE) ×2 IMPLANT
GLOVE BIOGEL PI IND STRL 8 (GLOVE) ×2 IMPLANT
GLOVE BIOGEL PI INDICATOR 8 (GLOVE) ×2
GLOVE ECLIPSE 8.0 STRL XLNG CF (GLOVE) ×2 IMPLANT
GOWN STRL REUS W/TWL XL LVL3 (GOWN DISPOSABLE) ×4 IMPLANT
HANDPIECE INTERPULSE COAX TIP (DISPOSABLE) ×2
HOLDER FOLEY CATH W/STRAP (MISCELLANEOUS) ×2 IMPLANT
IMMOBILIZER KNEE 20 (SOFTGOODS) ×2
IMMOBILIZER KNEE 20 THIGH 36 (SOFTGOODS) ×1 IMPLANT
INSERT TIB BEARING SZ 4 12 (Insert) ×2 IMPLANT
KIT TURNOVER KIT A (KITS) IMPLANT
KNEE PATELLA ASYMMETRIC 10X32 (Knees) ×2 IMPLANT
KNEE TIBIAL COMP TRI SZ4 (Knees) ×2 IMPLANT
NS IRRIG 1000ML POUR BTL (IV SOLUTION) ×2 IMPLANT
PACK TOTAL KNEE CUSTOM (KITS) ×2 IMPLANT
PADDING CAST COTTON 6X4 STRL (CAST SUPPLIES) ×4 IMPLANT
PENCIL SMOKE EVACUATOR (MISCELLANEOUS) ×2 IMPLANT
PIN FLUTED HEDLESS FIX 3.5X1/8 (PIN) ×2 IMPLANT
PROTECTOR NERVE ULNAR (MISCELLANEOUS) ×2 IMPLANT
SET HNDPC FAN SPRY TIP SCT (DISPOSABLE) ×1 IMPLANT
SET PAD KNEE POSITIONER (MISCELLANEOUS) ×2 IMPLANT
STAPLER VISISTAT 35W (STAPLE) IMPLANT
STRIP CLOSURE SKIN 1/2X4 (GAUZE/BANDAGES/DRESSINGS) IMPLANT
SUT MNCRL AB 4-0 PS2 18 (SUTURE) IMPLANT
SUT VIC AB 0 CT1 27 (SUTURE) ×2
SUT VIC AB 0 CT1 27XBRD ANTBC (SUTURE) ×1 IMPLANT
SUT VIC AB 1 CT1 36 (SUTURE) ×4 IMPLANT
SUT VIC AB 2-0 CT1 27 (SUTURE) ×4
SUT VIC AB 2-0 CT1 TAPERPNT 27 (SUTURE) ×2 IMPLANT
SYR BULB IRRIG 60ML STRL (SYRINGE) ×2 IMPLANT
TRAY FOLEY MTR SLVR 16FR STAT (SET/KITS/TRAYS/PACK) ×2 IMPLANT
WATER STERILE IRR 1000ML POUR (IV SOLUTION) ×2 IMPLANT
WRAP KNEE MAXI GEL POST OP (GAUZE/BANDAGES/DRESSINGS) ×2 IMPLANT
YANKAUER SUCT BULB TIP 10FT TU (MISCELLANEOUS) ×2 IMPLANT

## 2020-02-26 NOTE — Anesthesia Procedure Notes (Signed)
Anesthesia Regional Block: Adductor canal block   Pre-Anesthetic Checklist: ,, timeout performed, Correct Patient, Correct Site, Correct Laterality, Correct Procedure,, site marked, risks and benefits discussed, Surgical consent,  Pre-op evaluation,  At surgeon's request and post-op pain management  Laterality: Left  Prep: chloraprep       Needles:  Injection technique: Single-shot  Needle Type: Echogenic Stimulator Needle     Needle Length: 10cm  Needle Gauge: 20     Additional Needles:   Procedures:,,,, ultrasound used (permanent image in chart),,,,  Narrative:  Start time: 02/26/2020 11:20 AM End time: 02/26/2020 11:30 AM Injection made incrementally with aspirations every 5 mL.  Performed by: Personally  Anesthesiologist: Murvin Natal, MD  Additional Notes: Functioning IV was confirmed and monitors were applied. A time-out was performed. Hand hygiene and sterile gloves were used. The thigh was placed in a frog-leg position and prepped in a sterile fashion. A 163mm 20ga BBraun echogenic stimulator needle was placed using ultrasound guidance.  Negative aspiration and negative test dose prior to incremental administration of local anesthetic. The patient tolerated the procedure well.

## 2020-02-26 NOTE — Care Plan (Signed)
Ortho Bundle Case Management Note  Patient Details  Name: Daniel [name] MRN: [id number] Date of Birth: 1950/11/29  [facility] call to patient to discuss his upcoming Left total knee arthroplasty on 02/26/20 with Dr. Ninfa Linden. Patient is an Ortho bundle patient through [facility] and is agreeable to case management. Patient has a FWW and declines (for now) a 3in1/BSC. He requested a CPM for home use and Dr. Ninfa Linden in agreement. This has been ordered through [place], who has contacted patient and will set up for patient per their discretion. He has family that will be able to assist at home after discharge. Anticipate HHPT will be needed after a short hospital stay. Choice provided and referral made to [facility]. Reviewed all post-op instructions. Will continue to follow for all needs.          DME Arranged:  CPM (Has FWW, Declined 3in1/BSC; CPM ordered through [place]) DME Agency:  [employer]  HH Arranged:  PT [place] Agency:  [facility] (now [facility])  Additional Comments: Please contact me with any questions of if this plan should need to change.  Jamse Arn, RN, BSN, [employer]  [phone] 02/26/2020, 10:13 AM

## 2020-02-26 NOTE — Progress Notes (Signed)
Assisted Dr. Ellender with left, ultrasound guided, adductor canal block. Side rails up, monitors on throughout procedure. See vital signs in flow sheet. Tolerated Procedure well.  

## 2020-02-26 NOTE — Evaluation (Addendum)
Physical Therapy Evaluation Patient Details Name: Daniel [name] MRN: [id number] DOB: January 02, 1951 Today's Date: 02/26/2020   History of Present Illness  Patient is 69 y.o. male s/p Lt TKA on 02/26/20 with PMH significant for HTN, HLD, DM, GERD, OA, depression, anxiety.   Clinical Impression  Daniel [name] is a 69 y.o. male POD 0 s/p Lt TKA. Patient reports independence with mobility at baseline. Patient is now limited by functional impairments (see PT problem list below) and requires min assist for transfers and gait with RW. Patient was able to ambulate ~40 feet with RW and min assist. Patient instructed in exercise to facilitate ROM and circulation. Patient will benefit from continued skilled PT interventions to address impairments and progress towards PLOF. Acute PT will follow to progress mobility and stair training in preparation for safe discharge home.     Follow Up Recommendations Follow surgeon's recommendation for DC plan and follow-up therapies;Home health PT    Equipment Recommendations  3in1 (PT)    Recommendations for Other Services       Precautions / Restrictions Precautions Precautions: Fall Restrictions Weight Bearing Restrictions: No      Mobility  Bed Mobility Overal bed mobility: Needs Assistance Bed Mobility: Supine to Sit     Supine to sit: Min guard;HOB elevated     General bed mobility comments: cues to use bed raill, guarding for safety, no assist needed.  Transfers Overall transfer level: Needs assistance Equipment used: Rolling walker (2 wheeled) Transfers: Sit to/from Stand Sit to Stand: Min assist;From elevated surface         General transfer comment: cues for technique with RW, light assist for power up and to steady with rising.   Ambulation/Gait Ambulation/Gait assistance: Min assist Gait Distance (Feet): 40 Feet Assistive device: Rolling walker (2 wheeled) Gait Pattern/deviations: Step-to pattern;Decreased step length -  right;Decreased stance time - left;Decreased stride length;Decreased weight shift to left Gait velocity: decr   General Gait Details: cues for safe step pattern and proximity to RW, assist for walker positioning. cues to not step too close to walker.   Stairs            Wheelchair Mobility    Modified Rankin (Stroke Patients Only)       Balance Overall balance assessment: Needs assistance Sitting-balance support: Feet supported Sitting balance-Leahy Scale: Good     Standing balance support: During functional activity;Bilateral upper extremity supported Standing balance-Leahy Scale: Fair                Pertinent Vitals/Pain Pain Assessment: Faces Faces Pain Scale: Hurts little more Pain Location: Lt knee Pain Descriptors / Indicators: Aching;Discomfort;Grimacing Pain Intervention(s): Monitored during session;Limited activity within patient's tolerance;Repositioned;Ice applied    Home Living Family/patient expects to be discharged to:: Private residence Living Arrangements: Spouse/significant other Available Help at Discharge: Family Type of Home: House Home Access: Level entry;Stairs to enter (level entrance to basement apartment) Entrance Stairs-Rails: Right;Left Entrance Stairs-Number of Steps: 6-7 Home Layout: Two level;Laundry or work area in basement (apt in basement, pt plans to stay there) Home Equipment: Gilford Rile - 2 wheels Additional Comments: pt lives with wife, his son is coming in from [place] to stay and help out as well.    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     Hand Dominance   Dominant Hand: Right    Extremity/Trunk Assessment   Upper Extremity Assessment Upper Extremity Assessment: Overall WFL for  tasks assessed    Lower Extremity Assessment Lower Extremity Assessment: LLE deficits/detail LLE Deficits / Details: good quad activation, slight extensor lag LLE Sensation: WNL LLE  Coordination: WNL    Cervical / Trunk Assessment Cervical / Trunk Assessment: Normal  Communication   Communication: No difficulties  Cognition Arousal/Alertness: Awake/alert Behavior During Therapy: WFL for tasks assessed/performed Overall Cognitive Status: Within Functional Limits for tasks assessed             General Comments      Exercises Total Joint Exercises Ankle Circles/Pumps: AROM;Both;20 reps;Seated   Assessment/Plan    PT Assessment Patient needs continued PT services  PT Problem List Decreased strength;Decreased range of motion;Decreased activity tolerance;Decreased balance;Decreased mobility;Decreased knowledge of use of DME;Decreased knowledge of precautions       PT Treatment Interventions DME instruction;Gait training;Stair training;Therapeutic activities;Functional mobility training;Therapeutic exercise;Balance training;Patient/family education    PT Goals (Current goals can be found in the Care Plan section)  Acute Rehab PT Goals Patient Stated Goal: get back to independence PT Goal Formulation: With patient Time For Goal Achievement: 03/04/20 Potential to Achieve Goals: Good    Frequency 7X/week   Barriers to discharge        AM-PAC PT "6 Clicks" Mobility  Outcome Measure Help needed turning from your back to your side while in a flat bed without using bedrails?: A Little Help needed moving from lying on your back to sitting on the side of a flat bed without using bedrails?: A Little Help needed moving to and from a bed to a chair (including a wheelchair)?: A Little Help needed standing up from a chair using your arms (e.g., wheelchair or bedside chair)?: A Little Help needed to walk in hospital room?: A Little Help needed climbing 3-5 steps with a railing? : A Little 6 Click Score: 18    End of Session Equipment Utilized During Treatment: Gait belt;Left knee immobilizer Activity Tolerance: Patient tolerated treatment well Patient left: in  chair;with call bell/phone within reach;with chair alarm set Nurse Communication: Mobility status PT Visit Diagnosis: Muscle weakness (generalized) (M62.81);Difficulty in walking, not elsewhere classified (R26.2)    Time: 3646-8032 PT Time Calculation (min) (ACUTE ONLY): 23 min   Charges:   PT Evaluation $PT Eval Low Complexity: 1 Low PT Treatments $Gait Training: 8-22 mins        Verner Mould, DPT Acute Rehabilitation Services  Office [phone] Pager [phone]  02/26/2020 6:55 PM

## 2020-02-26 NOTE — Op Note (Signed)
NAMEGRAYSON, Daniel [name] MEDICAL RECORD PI:[id number] ACCOUNT [id number] DATE OF BIRTH:04-01-1951 FACILITY: [facility] LOCATION: [facility] PHYSICIAN:Paddy Walthall [place], MD  OPERATIVE REPORT  DATE OF PROCEDURE:  02/26/2020  PREOPERATIVE DIAGNOSIS:  Primary osteoarthritis and degenerative joint disease, left knee.  POSTOPERATIVE DIAGNOSIS:  Primary osteoarthritis and degenerative joint disease, left knee.  PROCEDURE:  Left total knee arthroplasty.  IMPLANTS:  Stryker Triathlon press-fit knee system with size 4 femur, size 4 tibial tray, 12 mm fixed bearing polyethylene insert, size 32 press-fit patellar button.  SURGEON:  Lind Guest.  Ninfa Linden, MD  ASSISTANT:  Erskine Emery, PA-C.  ANESTHESIA: 1.  Left lower extremity adductor canal block. 2.  Spinal. 3.  Local arthrotomy injection with 0.25% Marcaine with epinephrine.  ANTIBIOTICS:  Two g IV Ancef.  ESTIMATED BLOOD LOSS:  Less  than 100 mL.  TOURNIQUET TIME:  Less than 1 hour.  COMPLICATIONS:  None.  INDICATIONS:  The patient is a very pleasant 69 year old gentleman well known to me.  He has debilitating arthritis actually involving both his knees.  At this point, he has tried and failed all forms of conservative treatment, including hyaluronic acid  injections, steroid injections, activity modification and quad strengthening exercises, as well as rest.  He does take anti-inflammatories as well.  Both knees show severe varus malalignment with bone-on-bone wear.  At this point, his left knee pain is  detrimentally affecting his activities of daily living, his quality of life, and his mobility to the point he does wish to proceed with a total knee arthroplasty.  We had a long and thorough discussion about the risk of acute blood loss anemia, nerve or  vessel injury, fracture, infection, DVT, and implant failure.  We talked about our goals being decreased pain, improved mobility and overall improved quality of life.  DESCRIPTION  OF PROCEDURE:  After informed consent was obtained, the appropriate left knee was marked.  Anesthesia obtained a left lower extremity adductor canal block in the holding room.  He was brought to the operating room and sat up on the operating  table where spinal anesthesia was obtained.  He was laid in supine position on the operating table.  A Foley catheter was placed and nonsterile tourniquet was placed on his upper left thigh.  His left thigh, knee, leg, and ankle were prepped and draped  with DuraPrep and sterile drapes including a sterile stockinette.  Time-out was called to identify correct patient, correct left knee.  I then used an Esmarch wrap that leg and tourniquet inflated to 300 mm of pressure.  I then made a direct midline  incision over the patella and carried this proximally and distally.  I dissected down to the knee joint and carried out a medial parapatellar arthrotomy, finding a significantly large joint effusion, as well as cartilage wear throughout the knee in all 3  compartments and it was quite extensive.  With the knee in a flexed position, we removed the ACL, PCL, and remnants of the medial and lateral meniscus.  We set our extramedullary cutting guide for making our proximal tibia cut, correcting for varus and  valgus and a neutral slope and taking 9 mm off the high side.  We made this cut without difficulty.  We then went to our distal femoral cut using intramedullary guide through the notch of the femur, setting this for a left knee at 5 degrees externally  rotated and a 10 mm distal femoral cut.  We made this cut without difficulty and brought the knee  back down to full extension with a 9 and then a 10 mm extension block.  We had achieved full extension.  We then went back to the femur and put our femoral  sizing guide based off the epicondylar axis and Whiteside line.  Based off of this, we chose a size 4 femur.  We put our 4-in-1 cutting block for a size 4 femur, made our  anterior and posterior cuts, followed by our chamfer cuts.  We then made our  femoral box cut.  Attention was then turned back to the tibia.  We chose a size 4 tibial tray for coverage, setting the rotation off the tibial tubercle and the femur.  Based on his good quality of bone, with the press-fit components were appropriate.   We placed our press-fit keel through the punch and was able to then have our tibial tray in place, size 4, followed by a 4 left femur.  We trialed a 10 and 11 mm fixed bearing polyethylene insert and we felt that was stable.  We probably go with a 12 mm  insert for the final insert.  We then made our patellar cut and drilled 3 holes for a press-fit size 32 patellar button.  We then removed all instrumentation from the knee, irrigated the knee with normal saline solution and then dried it real well.  We  placed our Marcaine with epinephrine mixture around the arthrotomy and dried again.  With the knee in a flexed position, we then placed our real Stryker press-fit tibial tray size 4, followed by a real size 4 press-fit femur.  We placed our 12 mm fixed  bearing polyethylene insert and press-fit our patellar button, size 32.  We then brought the knee back down to full extension.  I put him through several cycles of motion.  I was pleased with range of motion and stability with varus and valgus stressing  and drawer sign as well.  He had good range of motion.  We then let the tourniquet down.  Hemostasis was obtained with electrocautery.  We closed the arthrotomy with interrupted #1 Vicryl suture, followed by 0 Vicryl to close the deep tissue and 2-0  Vicryl to close the subcutaneous tissue.  The skin was reapproximated with staples.  Xeroform well-padded sterile dressing was applied.  He was taken to the recovery room in stable condition with all final counts being correct.  There were no  complications noted.  Of note, Benita Stabile, PA-C, assisted in the entire case and his assistance  was crucial for facilitating all aspects of this case.  VN/NUANCE  D:02/26/2020 T:02/26/2020 JOB:[id number]

## 2020-02-26 NOTE — Transfer of Care (Signed)
Immediate Anesthesia Transfer of Care Note  Patient: Daniel [name]  Procedure(s) Performed: LEFT TOTAL KNEE ARTHROPLASTY (Left Knee)  Patient Location: PACU  Anesthesia Type:Spinal  Level of Consciousness: awake  Airway & Oxygen Therapy: Patient Spontanous Breathing  Post-op Assessment: Report given to RN and Post -op Vital signs reviewed and stable  Post vital signs: Reviewed and stable  Last Vitals:  Vitals Value Taken Time  BP 136/73 02/26/20 1423  Temp    Pulse 66 02/26/20 1424  Resp 13 02/26/20 1424  SpO2 96 % 02/26/20 1424  Vitals shown include unvalidated device data.  Last Pain:  Vitals:   02/26/20 0918  TempSrc: Oral      Patients Stated Pain Goal: 4 (71/58/06 3868)  Complications: No complications documented.

## 2020-02-26 NOTE — Anesthesia Procedure Notes (Signed)
Spinal  Patient location during procedure: OR Start time: 02/26/2020 12:25 PM End time: 02/26/2020 12:30 PM Staffing Performed: resident/CRNA and anesthesiologist  Anesthesiologist: Murvin Natal, MD Resident/CRNA: Milford Cage, CRNA Preanesthetic Checklist Completed: patient identified, IV checked, site marked, risks and benefits discussed, surgical consent, monitors and equipment checked, pre-op evaluation and timeout performed Spinal Block Patient position: sitting Prep: DuraPrep Patient monitoring: heart rate, cardiac monitor, continuous pulse ox and blood pressure Approach: midline Location: L3-4 Injection technique: single-shot Needle Needle type: Pencan  Needle gauge: 24 G Needle length: 10 cm Assessment Sensory level: T4 Additional Notes Functioning IV was confirmed and monitors were applied. Sterile prep and drape, including hand hygiene and sterile gloves were used. The patient was positioned and the spine was prepped. The skin was anesthetized with lidocaine.  Free flow of clear CSF was obtained on the 3rd pass prior to injecting local anesthetic into the CSF.  The spinal needle aspirated freely following injection.  The needle was carefully withdrawn.  The patient tolerated the procedure well.

## 2020-02-26 NOTE — Anesthesia Preprocedure Evaluation (Addendum)
Anesthesia Evaluation  Patient identified by MRN, date of birth, ID band Patient awake    Reviewed: Allergy & Precautions, NPO status , Patient's Chart, lab work & pertinent test results  Airway Mallampati: III  TM Distance: >3 FB Neck ROM: Full    Dental no notable dental hx.    Pulmonary neg pulmonary ROS,    Pulmonary exam normal breath sounds clear to auscultation       Cardiovascular hypertension, Pt. on medications Normal cardiovascular exam Rhythm:Regular Rate:Normal  ECG: NSR   Neuro/Psych PSYCHIATRIC DISORDERS Anxiety Depression negative neurological ROS     GI/Hepatic Neg liver ROS, GERD  Medicated and Controlled,  Endo/Other  diabetes, Oral Hypoglycemic Agents  Renal/GU negative Renal ROS     Musculoskeletal negative musculoskeletal ROS (+)   Abdominal (+) + obese,   Peds  Hematology  (+) anemia , HLD   Anesthesia Other Findings left knee osteoarthritis  Reproductive/Obstetrics                            Anesthesia Physical Anesthesia Plan  ASA: III  Anesthesia Plan: Spinal and Regional   Post-op Pain Management:  Regional for Post-op pain   Induction: Intravenous  PONV Risk Score and Plan: 1 and Ondansetron, Dexamethasone, Midazolam, Propofol infusion and Treatment may vary due to age or medical condition  Airway Management Planned: Simple Face Mask  Additional Equipment:   Intra-op Plan:   Post-operative Plan:   Informed Consent: I have reviewed the patients History and Physical, chart, labs and discussed the procedure including the risks, benefits and alternatives for the proposed anesthesia with the patient or authorized representative who has indicated his/her understanding and acceptance.     Dental advisory given  Plan Discussed with: CRNA  Anesthesia Plan Comments:         Anesthesia Quick Evaluation

## 2020-02-26 NOTE — Brief Op Note (Signed)
02/26/2020  1:56 PM  PATIENT:  Daniel [name]  69 y.o. male  PRE-OPERATIVE DIAGNOSIS:  left knee osteoarthritis  POST-OPERATIVE DIAGNOSIS:  left knee osteoarthritis  PROCEDURE:  Procedure(s): LEFT TOTAL KNEE ARTHROPLASTY (Left)  SURGEON:  Surgeon(s) and Role:    Mcarthur Rossetti, MD - Primary  PHYSICIAN ASSISTANT:  Benita Stabile, PA-C  ANESTHESIA:   local, regional and spinal  EBL:  50 mL   COUNTS:  YES  TOURNIQUET:   Total Tourniquet Time Documented: Thigh (Left) - 61 minutes Total: Thigh (Left) - 61 minutes   DICTATION: .Other Dictation: Dictation Number [id number]  PLAN OF CARE: Admit for overnight observation  PATIENT DISPOSITION:  PACU - hemodynamically stable.   Delay start of Pharmacological VTE agent (>24hrs) due to surgical blood loss or risk of bleeding: no

## 2020-02-26 NOTE — Anesthesia Postprocedure Evaluation (Signed)
Anesthesia Post Note  Patient: Daniel [name]  Procedure(s) Performed: LEFT TOTAL KNEE ARTHROPLASTY (Left Knee)     Patient location during evaluation: PACU Anesthesia Type: Regional and Spinal Level of consciousness: oriented and awake Pain management: pain level controlled Vital Signs Assessment: post-procedure vital signs reviewed and stable Respiratory status: spontaneous breathing, respiratory function stable and patient connected to nasal cannula oxygen Cardiovascular status: blood pressure returned to baseline and stable Postop Assessment: no headache, no backache, no apparent nausea or vomiting and spinal receding Anesthetic complications: no   No complications documented.  Last Vitals:  Vitals:   02/26/20 1543 02/26/20 1748  BP: 133/76 (!) 147/79  Pulse: (!) 57 72  Resp: 18 16  Temp: 36.6 C   SpO2: 100% 98%    Last Pain:  Vitals:   02/26/20 1726  TempSrc:   PainSc: 3                  [name]

## 2020-02-26 NOTE — Interval H&P Note (Signed)
History and Physical Interval Note: The patient understands that he is here today for a left total knee arthroplasty to treat the pain from his osteoarthritis in the left knee.  There has been no interval change in his medical status.  See recent H&P.  The risk and benefits of surgery have been explained in detail and informed consent is obtained.  02/26/2020 11:07 AM  Daniel [name]  has presented today for surgery, with the diagnosis of left knee osteoarthritis.  The various methods of treatment have been discussed with the patient and family. After consideration of risks, benefits and other options for treatment, the patient has consented to  Procedure(s): LEFT TOTAL KNEE ARTHROPLASTY (Left) as a surgical intervention.  The patient's history has been reviewed, patient examined, no change in status, stable for surgery.  I have reviewed the patient's chart and labs.  Questions were answered to the patient's satisfaction.     Mcarthur Rossetti

## 2020-02-27 DIAGNOSIS — M1712 Unilateral primary osteoarthritis, left knee: Secondary | ICD-10-CM | POA: Diagnosis not present

## 2020-02-27 DIAGNOSIS — Z7982 Long term (current) use of aspirin: Secondary | ICD-10-CM | POA: Diagnosis not present

## 2020-02-27 DIAGNOSIS — Z96652 Presence of left artificial knee joint: Secondary | ICD-10-CM | POA: Diagnosis not present

## 2020-02-27 DIAGNOSIS — R531 Weakness: Secondary | ICD-10-CM | POA: Diagnosis not present

## 2020-02-27 DIAGNOSIS — E119 Type 2 diabetes mellitus without complications: Secondary | ICD-10-CM | POA: Diagnosis not present

## 2020-02-27 DIAGNOSIS — I10 Essential (primary) hypertension: Secondary | ICD-10-CM | POA: Diagnosis not present

## 2020-02-27 DIAGNOSIS — Z7984 Long term (current) use of oral hypoglycemic drugs: Secondary | ICD-10-CM | POA: Diagnosis not present

## 2020-02-27 DIAGNOSIS — E785 Hyperlipidemia, unspecified: Secondary | ICD-10-CM | POA: Diagnosis not present

## 2020-02-27 DIAGNOSIS — Z79899 Other long term (current) drug therapy: Secondary | ICD-10-CM | POA: Diagnosis not present

## 2020-02-27 LAB — BASIC METABOLIC PANEL
Anion gap: 11 (ref 5–15)
BUN: 30 mg/dL — ABNORMAL HIGH (ref 8–23)
CO2: 25 mmol/L (ref 22–32)
Calcium: 8.3 mg/dL — ABNORMAL LOW (ref 8.9–10.3)
Chloride: 98 mmol/L (ref 98–111)
Creatinine, Ser: 1.3 mg/dL — ABNORMAL HIGH (ref 0.61–1.24)
GFR calc Af Amer: >60 mL/min (ref >60)
GFR calc non Af Amer: 55 mL/min — ABNORMAL LOW (ref >60)
Glucose, Bld: 177 mg/dL — ABNORMAL HIGH (ref 70–99)
Potassium: 4.8 mmol/L (ref 3.5–5.1)
Sodium: 134 mmol/L — ABNORMAL LOW (ref 135–145)

## 2020-02-27 LAB — CBC
HCT: 26.2 % — ABNORMAL LOW (ref 39.0–52.0)
Hemoglobin: 8.3 g/dL — ABNORMAL LOW (ref 13.0–17.0)
MCH: 30.5 pg (ref 26.0–34.0)
MCHC: 31.7 g/dL (ref 30.0–36.0)
MCV: 96.3 fL (ref 80.0–100.0)
Platelets: 264 10*3/uL (ref 150–400)
RBC: 2.72 MIL/uL — ABNORMAL LOW (ref 4.22–5.81)
RDW: 12.9 % (ref 11.5–15.5)
WBC: 15.5 10*3/uL — ABNORMAL HIGH (ref 4.0–10.5)
nRBC: 0 % (ref 0.0–0.2)

## 2020-02-27 LAB — GLUCOSE, CAPILLARY: Glucose-Capillary: 194 mg/dL — ABNORMAL HIGH (ref 70–99)

## 2020-02-27 MED ORDER — METHOCARBAMOL 500 MG PO TABS
500.0000 mg | ORAL_TABLET | Freq: Four times a day (QID) | ORAL | Status: DC | PRN
  Administered 2020-02-27: 500 mg via ORAL

## 2020-02-27 MED ORDER — METHOCARBAMOL 500 MG PO TABS
500.0000 mg | ORAL_TABLET | Freq: Four times a day (QID) | ORAL | 1 refills | Status: DC | PRN
Start: 2020-02-27 — End: 2020-10-18

## 2020-02-27 MED ORDER — OXYCODONE HCL 5 MG PO TABS: 5.0000 mg | ORAL_TABLET | ORAL | 0 refills | Status: DC | PRN

## 2020-02-27 MED ORDER — ONDANSETRON 4 MG PO TBDP: 4.0000 mg | ORAL_TABLET | Freq: Three times a day (TID) | ORAL | 0 refills | Status: DC | PRN

## 2020-02-27 MED ORDER — ASPIRIN 325 MG PO TBEC
325.0000 mg | DELAYED_RELEASE_TABLET | Freq: Two times a day (BID) | ORAL | 0 refills | Status: DC
Start: 2020-02-27 — End: 2022-07-11

## 2020-02-27 NOTE — Plan of Care (Signed)
Pt stable with no needs. No changes from am assessment. Dressing dry and intact.

## 2020-02-27 NOTE — Plan of Care (Signed)
  Problem: Clinical Measurements: Goal: Respiratory complications will improve Outcome: Progressing   Problem: Clinical Measurements: Goal: Cardiovascular complication will be avoided Outcome: Progressing   Problem: Coping: Goal: Level of anxiety will decrease Outcome: Progressing   Problem: Elimination: Goal: Will not experience complications related to bowel motility Outcome: Progressing   Problem: Safety: Goal: Ability to remain free from injury will improve Outcome: Progressing

## 2020-02-27 NOTE — TOC Progression Note (Signed)
Transition of Care [facility]) - Progression Note    Patient Details  Name: Daniel [name] MRN: [id number] Date of Birth: [birth year]  [facility] CM/SW Contact  Joaquin Courts, RN Phone Number: 02/27/2020, 11:49 AM  Clinical Narrative:    Patient set up with [facility] for HHPT, [employer] to deliver 3in1 to bedside for home use.  Patient reports he has RW at home.   Expected Discharge Plan: [place] Barriers to Discharge: No Barriers Identified  Expected Discharge Plan and Services Expected Discharge Plan: [place]   Discharge Planning Services: CM Consult Post Acute Care Choice: [place] arrangements for the past 2 months: Single Family Home Expected Discharge Date: 02/27/20               DME Arranged: 3-N-1 DME Agency: [employer] Date DME Agency Contacted: 02/27/20 Time DME Agency Contacted: [phone] Representative spoke with at DME Agency: on call referral line HH Arranged: PT [place]: [facility] (formerly [employer])     Representative spoke with at [place]: pre-arranged in MD office   Social Determinants of Health (SDOH) Interventions    Readmission Risk Interventions No flowsheet data found.

## 2020-02-27 NOTE — Progress Notes (Signed)
Physical Therapy Treatment Patient Details Name: Daniel [name] MRN: [id number] DOB: Aug 21, 1950 Today's Date: 02/27/2020    History of Present Illness Patient is 69 y.o. male s/p Lt TKA on 02/26/20 with PMH significant for HTN, HLD, DM, GERD, OA, depression, anxiety.     PT Comments    Pt ambulated 39' with RW, no loss of balance and demonstrates good understanding of HEP. From PT standpoint, he is ready to DC home.   Follow Up Recommendations  Follow surgeon's recommendation for DC plan and follow-up therapies;Home health PT     Equipment Recommendations  3in1 (PT)    Recommendations for Other Services       Precautions / Restrictions Precautions Precautions: Fall;Knee Precaution Booklet Issued: Yes (comment) Restrictions Weight Bearing Restrictions: No    Mobility  Bed Mobility Overal bed mobility: Modified Independent       Supine to sit: Modified independent (Device/Increase time)     General bed mobility comments: used rail  Transfers Overall transfer level: Needs assistance Equipment used: Rolling walker (2 wheeled) Transfers: Sit to/from Stand Sit to Stand: Supervision         General transfer comment: VCs hand placement  Ambulation/Gait Ambulation/Gait assistance: Supervision Gait Distance (Feet): 80 Feet Assistive device: Rolling walker (2 wheeled) Gait Pattern/deviations: Step-to pattern;Decreased step length - right;Decreased stance time - left;Decreased stride length;Decreased weight shift to left Gait velocity: decr   General Gait Details: cues for safe step pattern and proximity to RW, VCs for positioning in RW and to lift head   Stairs Stairs:  (pt reported he has a level entry through finished basement where he will be staying, stated he doesn't need to practice stairs)           Wheelchair Mobility    Modified Rankin (Stroke Patients Only)       Balance Overall balance assessment: Modified Independent Sitting-balance  support: Feet supported Sitting balance-Leahy Scale: Good     Standing balance support: During functional activity;Bilateral upper extremity supported Standing balance-Leahy Scale: Fair                              Cognition Arousal/Alertness: Awake/alert Behavior During Therapy: WFL for tasks assessed/performed Overall Cognitive Status: Within Functional Limits for tasks assessed                                        Exercises Total Joint Exercises Ankle Circles/Pumps: AROM;Both;20 reps;Seated Quad Sets: AROM;Left;5 reps;Supine Short Arc Quad: AAROM;Left;AROM;10 reps;Supine Heel Slides: AAROM;Left;Supine;5 reps Hip ABduction/ADduction: AAROM;Left;10 reps;Supine Straight Leg Raises: AAROM;Left;5 reps;Supine Long Arc Quad: AROM;Left;10 reps Knee Flexion: AAROM;Left;10 reps;Seated Goniometric ROM: 5-75* AAROM L knee    General Comments        Pertinent Vitals/Pain Pain Assessment: 0-10 Pain Score: 5  Pain Location: Lt knee Pain Descriptors / Indicators: Aching;Discomfort;Grimacing Pain Intervention(s): Limited activity within patient's tolerance;Monitored during session;Premedicated before session;Ice applied    Home Living                      Prior Function            PT Goals (current goals can now be found in the care plan section) Acute Rehab PT Goals Patient Stated Goal: get back to independence PT Goal Formulation: With patient Time For Goal Achievement: 03/04/20 Potential to Achieve Goals: Good Progress  towards PT goals: Progressing toward goals    Frequency    7X/week      PT Plan Current plan remains appropriate    Co-evaluation              AM-PAC PT "6 Clicks" Mobility   Outcome Measure  Help needed turning from your back to your side while in a flat bed without using bedrails?: None Help needed moving from lying on your back to sitting on the side of a flat bed without using bedrails?: A  Little Help needed moving to and from a bed to a chair (including a wheelchair)?: None Help needed standing up from a chair using your arms (e.g., wheelchair or bedside chair)?: None Help needed to walk in hospital room?: None Help needed climbing 3-5 steps with a railing? : A Little 6 Click Score: 22    End of Session Equipment Utilized During Treatment: Gait belt Activity Tolerance: Patient tolerated treatment well Patient left: in chair;with call bell/phone within reach;with chair alarm set Nurse Communication: Mobility status PT Visit Diagnosis: Muscle weakness (generalized) (M62.81);Difficulty in walking, not elsewhere classified (R26.2)     Time: 8638-1771 PT Time Calculation (min) (ACUTE ONLY): 33 min  Charges:  $Gait Training: 8-22 mins $Therapeutic Exercise: 8-22 mins                    Blondell Reveal Kistler PT 02/27/2020  Acute Rehabilitation Services Pager [phone] Office [phone]

## 2020-02-27 NOTE — Discharge Summary (Signed)
Patient ID: Daniel [name] MRN: [id number] DOB/AGE: 1950-09-17 69 y.o.  Admit date: 02/26/2020 Discharge date: 02/27/2020  Admission Diagnoses:  Principal Problem:   Primary osteoarthritis of left knee Active Problems:   Status post total left knee replacement   Discharge Diagnoses:  Same  Past Medical History:  Diagnosis Date  . Allergic rhinitis   . Allergy   . Anxiety   . Arthritis   . Benign hypertension   . Cataract   . Depression   . Diabetes mellitus ([place])    type 2   . GERD (gastroesophageal reflux disease)   . History of pneumonia   . HLD (hyperlipidemia)   . Neuromuscular disorder ([place])    pt denies at 8/19/visit   . Pneumonia    hx of several times per pt - last episode 4-5 years ago     Surgeries: Procedure(s): LEFT TOTAL KNEE ARTHROPLASTY on 02/26/2020   Consultants:   Discharged Condition: Improved  Hospital Course: ALVERTO SHEDD is an 68 y.o. male who was admitted 02/26/2020 for operative treatment ofPrimary osteoarthritis of left knee. Patient has severe unremitting pain that affects sleep, daily activities, and work/hobbies. After pre-op clearance the patient was taken to the operating room on 02/26/2020 and underwent  Procedure(s): LEFT TOTAL KNEE ARTHROPLASTY.    Patient was given perioperative antibiotics:  Anti-infectives (From admission, onward)   Start     Dose/Rate Route Frequency Ordered Stop   02/26/20 1830  ceFAZolin (ANCEF) IVPB 1 g/50 mL premix        1 g 100 mL/hr over 30 Minutes Intravenous Every 6 hours 02/26/20 1539 02/27/20 0039   02/26/20 0900  ceFAZolin (ANCEF) IVPB 2g/100 mL premix        2 g 200 mL/hr over 30 Minutes Intravenous On call to O.R. 02/26/20 0855 02/26/20 1234       Patient was given sequential compression devices, early ambulation, and chemoprophylaxis to prevent DVT.  Patient benefited maximally from hospital stay and there were no complications.    Recent vital signs:  Patient Vitals for the past 24 hrs:   BP Temp Temp src Pulse Resp SpO2 Height Weight  02/27/20 0500 136/75 99.8 F (37.7 C) Oral 81 18 96 % -- --  02/27/20 0124 (!) 136/59 98.7 F (37.1 C) Oral 79 16 96 % -- --  02/26/20 2107 136/73 98.6 F (37 C) Oral 73 16 100 % -- --  02/26/20 1748 (!) 147/79 -- -- 72 16 98 % -- --  02/26/20 1543 133/76 97.9 F (36.6 C) Oral (!) 57 18 100 % -- --  02/26/20 1530 137/75 97.9 F (36.6 C) -- (!) 58 12 100 % -- --  02/26/20 1515 133/72 -- -- (!) 58 16 100 % -- --  02/26/20 1500 129/70 -- -- (!) 56 12 100 % -- --  02/26/20 1445 127/72 -- -- (!) 59 (!) 8 100 % -- --  02/26/20 1430 131/75 -- -- 63 10 100 % -- --  02/26/20 1423 136/73 98.6 F (37 C) -- 68 14 95 % -- --  02/26/20 1138 (!) 141/73 -- -- 62 19 99 % -- --  02/26/20 1134 (!) 141/73 -- -- 62 (!) 25 99 % -- --  02/26/20 1132 -- -- -- 66 16 99 % -- --  02/26/20 1131 (!) 148/74 -- -- 63 19 97 % -- --  02/26/20 1130 -- -- -- 64 16 98 % -- --  02/26/20 1129 -- -- -- 63 14 99 % -- --  02/26/20 1128 -- -- -- 60 11 99 % -- --  02/26/20 1127 -- -- -- 63 12 99 % -- --  02/26/20 1126 (!) 149/79 -- -- 63 13 97 % -- --  02/26/20 1125 -- -- -- 65 11 95 % -- --  02/26/20 0948 -- -- -- -- -- -- $Rem'5\' 8"'amGR$  (1.727 m) 106.6 kg  02/26/20 0918 (!) 149/80 98.9 F (37.2 C) Oral 74 12 95 % -- --     Recent laboratory studies:  Recent Labs    02/27/20 0330  WBC 15.5*  HGB 8.3*  HCT 26.2*  PLT 264  NA 134*  K 4.8  CL 98  CO2 25  BUN 30*  CREATININE 1.30*  GLUCOSE 177*  CALCIUM 8.3*     Discharge Medications:   Allergies as of 02/27/2020      Reactions   Actos [pioglitazone] Other (See Comments), Shortness Of Breath, Swelling   Blood sugar went up and down and sweating profusely   Codeine Shortness Of Breath, Palpitations      Medication List    STOP taking these medications   aspirin 81 MG tablet Replaced by: aspirin 325 MG EC tablet     TAKE these medications   aspirin 325 MG EC tablet Take 1 tablet (325 mg total) by mouth 2  (two) times daily after a meal. Replaces: aspirin 81 MG tablet   blood glucose meter kit and supplies 1 each by Other route as directed. Dispense based on patient and insurance preference. Check once per day (variable times).  Accu check.   cetirizine 10 MG tablet Commonly known as: ZYRTEC Take 10 mg by mouth daily as needed for allergies.   fluticasone 50 MCG/ACT nasal spray Commonly known as: FLONASE Place 1 spray into both nostrils daily.   gabapentin 300 MG capsule Commonly known as: NEURONTIN Take 1-2 capsules (300-600 mg total) by mouth 2 (two) times daily as needed. What changed:   how much to take  when to take this   glimepiride 4 MG tablet Commonly known as: AMARYL Take 1 tablet (4 mg total) by mouth 2 (two) times daily. What changed: when to take this   ibuprofen 200 MG tablet Commonly known as: ADVIL Take 400 mg by mouth in the morning and at bedtime.   liraglutide 18 MG/3ML Sopn Commonly known as: VICTOZA Inject 0.3 mLs (1.8 mg total) into the skin daily. I   lisinopril 20 MG tablet Commonly known as: ZESTRIL Take 1 tablet (20 mg total) by mouth daily.   metFORMIN 1000 MG tablet Commonly known as: GLUCOPHAGE TAKE 1 TABLET BY MOUTH TWICE DAILY WITH A MEAL What changed: See the new instructions.   methocarbamol 500 MG tablet Commonly known as: ROBAXIN Take 1 tablet (500 mg total) by mouth every 6 (six) hours as needed for muscle spasms.   montelukast 10 MG tablet Commonly known as: SINGULAIR Take 1 tablet (10 mg total) by mouth at bedtime.   multivitamin with minerals Tabs tablet Take 1 tablet by mouth daily.   omeprazole 20 MG capsule Commonly known as: PRILOSEC Take 1 capsule (20 mg total) by mouth daily.   ondansetron 4 MG disintegrating tablet Commonly known as: Zofran ODT Take 1 tablet (4 mg total) by mouth every 8 (eight) hours as needed for nausea or vomiting.   ONE TOUCH ULTRA TEST test strip Generic drug: glucose blood Test Twice  a day as directed   OneTouch Ultra test strip Generic drug: glucose blood USE 1  STRIP TO CHECK GLUCOSE ONCE DAILY   glucose blood test strip check sugars once daily. Dx: E11.42   OneTouch Delica Plus Lancet33G Misc USE 1 TO CHECK GLUCOSE ONCE DAILY   oxyCODONE 5 MG immediate release tablet Commonly known as: Oxy IR/ROXICODONE Take 1-2 tablets (5-10 mg total) by mouth every 4 (four) hours as needed for moderate pain (pain score 4-6).   pravastatin 80 MG tablet Commonly known as: PRAVACHOL Take 1 tablet (80 mg total) by mouth daily.   sertraline 100 MG tablet Commonly known as: ZOLOFT Take 2 tablets (200 mg total) by mouth daily. Start with one per day for initial 2 weeks, then increase to 2 per day. What changed:   how much to take  additional instructions   sodium chloride 0.65 % Soln nasal spray Commonly known as: OCEAN Place 1 spray into both nostrils as needed for congestion.   traMADol 50 MG tablet Commonly known as: ULTRAM TAKE 1 TABLET BY MOUTH EVERY 6 HOURS AS NEEDED What changed:   how much to take  reasons to take this   VITAMIN B 12 PO Take 1,000 mg by mouth daily.   Vitamin D 125 MCG (5000 UT) Caps Take 5,000 Units by mouth daily.   vitamin E 180 MG (400 UNITS) capsule Take 400 Units by mouth daily.   Voltaren 1 % Gel Generic drug: diclofenac Sodium Apply 2 g topically daily.            Durable Medical Equipment  (From admission, onward)         Start     Ordered   02/26/20 1540  DME 3 n 1  Once        02/26/20 1539   02/26/20 1540  DME Walker rolling  Once       Question Answer Comment  Walker: With 5 Inch Wheels   Patient needs a walker to treat with the following condition Status post total left knee replacement      02/26/20 1539          Diagnostic Studies: DG Knee Left Port  Result Date: 02/26/2020 CLINICAL DATA:  Status post left knee replacement EXAM: PORTABLE LEFT KNEE - 2 VIEW COMPARISON:  11/02/2019 FINDINGS:  Postsurgical changes are noted. No acute fracture or dislocation is noted. No soft tissue changes are seen. IMPRESSION: Status post left knee replacement Electronically Signed   By: Alcide Clever M.D.   On: 02/26/2020 15:06    Disposition: Discharge disposition: 01-Home or Self Care          Follow-up Information    Kathryne Hitch, MD. Go on 03/10/2020.   Specialty: Orthopedic Surgery Why: at 1:15 pm for your 2 week post-op appointment in our office. Contact information: [address] [place] [address] [phone]        [facility] Follow up.   Specialty: Home Health Services Why: Someone from the home health agency will be in contact with you after discharge to arrange your first in home physical therapy appointment. Contact information: [address] [place] [address] [phone]        [facility] Go on 03/11/2020.   Specialty: Physical Therapy Why: [facility] in [place] is where you are scheduled on 03/11/20 at 10:00 am. Please arrive about 10-15 minutes early for your paperwork. Address: [address] [place], [place] [address] Contact information: [facility] [address] [place]  [place] [address] [phone]                Signed: Mcarthur Rossetti 02/27/2020, 9:05 AM

## 2020-02-27 NOTE — Progress Notes (Signed)
Subjective: 1 Day Post-Op Procedure(s) (LRB): LEFT TOTAL KNEE ARTHROPLASTY (Left) Patient reports pain as moderate.  Acute on chronic blood loos anemia, but asymptomatic.  Prefers to still go home today.  Objective: Vital signs in last 24 hours: Temp:  [97.9 F (36.6 C)-99.8 F (37.7 C)] 99.8 F (37.7 C) (08/28 0500) Pulse Rate:  [56-81] 81 (08/28 0500) Resp:  [8-25] 18 (08/28 0500) BP: (127-149)/(59-80) 136/75 (08/28 0500) SpO2:  [95 %-100 %] 96 % (08/28 0500) Weight:  [106.6 kg] 106.6 kg (08/27 0948)  Intake/Output from previous day: 08/27 0701 - 08/28 0700 In: 2625.2 [P.O.:690; I.V.:1735.2; IV Piggyback:200] Out: 1300 [Urine:1250; Blood:50] Intake/Output this shift: No intake/output data recorded.  Recent Labs    02/27/20 0330  HGB 8.3*   Recent Labs    02/27/20 0330  WBC 15.5*  RBC 2.72*  HCT 26.2*  PLT 264   Recent Labs    02/27/20 0330  NA 134*  K 4.8  CL 98  CO2 25  BUN 30*  CREATININE 1.30*  GLUCOSE 177*  CALCIUM 8.3*   No results for input(s): LABPT, INR in the last 72 hours.  Sensation intact distally Intact pulses distally Dorsiflexion/Plantar flexion intact Incision: dressing C/D/I No cellulitis present Compartment soft   Assessment/Plan: 1 Day Post-Op Procedure(s) (LRB): LEFT TOTAL KNEE ARTHROPLASTY (Left) Up with therapy Discharge home with home health    Patient's anticipated LOS is less than 2 midnights, meeting these requirements: - Younger than 27 - Lives within 1 hour of care - Has a competent adult at home to recover with post-op recover - NO history of  - Chronic pain requiring opiods  - Diabetes  - Coronary Artery Disease  - Heart failure  - Heart attack  - Stroke  - DVT/VTE  - Cardiac arrhythmia  - Respiratory Failure/COPD  - Renal failure  - Anemia  - Advanced Liver disease       Mcarthur Rossetti 02/27/2020, 8:58 AM

## 2020-02-28 DIAGNOSIS — Z471 Aftercare following joint replacement surgery: Secondary | ICD-10-CM | POA: Diagnosis not present

## 2020-02-28 DIAGNOSIS — I10 Essential (primary) hypertension: Secondary | ICD-10-CM | POA: Diagnosis not present

## 2020-02-28 DIAGNOSIS — E1142 Type 2 diabetes mellitus with diabetic polyneuropathy: Secondary | ICD-10-CM | POA: Diagnosis not present

## 2020-02-28 DIAGNOSIS — Z794 Long term (current) use of insulin: Secondary | ICD-10-CM | POA: Diagnosis not present

## 2020-02-28 DIAGNOSIS — J301 Allergic rhinitis due to pollen: Secondary | ICD-10-CM | POA: Diagnosis not present

## 2020-02-28 DIAGNOSIS — Z96652 Presence of left artificial knee joint: Secondary | ICD-10-CM | POA: Diagnosis not present

## 2020-02-28 DIAGNOSIS — M1711 Unilateral primary osteoarthritis, right knee: Secondary | ICD-10-CM | POA: Diagnosis not present

## 2020-02-28 DIAGNOSIS — Z7982 Long term (current) use of aspirin: Secondary | ICD-10-CM | POA: Diagnosis not present

## 2020-02-28 DIAGNOSIS — K219 Gastro-esophageal reflux disease without esophagitis: Secondary | ICD-10-CM | POA: Diagnosis not present

## 2020-02-28 DIAGNOSIS — E7800 Pure hypercholesterolemia, unspecified: Secondary | ICD-10-CM | POA: Diagnosis not present

## 2020-02-29 ENCOUNTER — Telehealth: Payer: Self-pay | Admitting: *Deleted

## 2020-02-29 NOTE — Telephone Encounter (Signed)
Ortho bundle D/C call completed. 

## 2020-03-01 ENCOUNTER — Encounter (HOSPITAL_COMMUNITY): Payer: Self-pay | Admitting: Orthopaedic Surgery

## 2020-03-01 DIAGNOSIS — Z7982 Long term (current) use of aspirin: Secondary | ICD-10-CM | POA: Diagnosis not present

## 2020-03-01 DIAGNOSIS — E7800 Pure hypercholesterolemia, unspecified: Secondary | ICD-10-CM | POA: Diagnosis not present

## 2020-03-01 DIAGNOSIS — Z471 Aftercare following joint replacement surgery: Secondary | ICD-10-CM | POA: Diagnosis not present

## 2020-03-01 DIAGNOSIS — M1711 Unilateral primary osteoarthritis, right knee: Secondary | ICD-10-CM | POA: Diagnosis not present

## 2020-03-01 DIAGNOSIS — J301 Allergic rhinitis due to pollen: Secondary | ICD-10-CM | POA: Diagnosis not present

## 2020-03-01 DIAGNOSIS — K219 Gastro-esophageal reflux disease without esophagitis: Secondary | ICD-10-CM | POA: Diagnosis not present

## 2020-03-01 DIAGNOSIS — I10 Essential (primary) hypertension: Secondary | ICD-10-CM | POA: Diagnosis not present

## 2020-03-01 DIAGNOSIS — E1142 Type 2 diabetes mellitus with diabetic polyneuropathy: Secondary | ICD-10-CM | POA: Diagnosis not present

## 2020-03-01 DIAGNOSIS — Z96652 Presence of left artificial knee joint: Secondary | ICD-10-CM | POA: Diagnosis not present

## 2020-03-01 DIAGNOSIS — Z794 Long term (current) use of insulin: Secondary | ICD-10-CM | POA: Diagnosis not present

## 2020-03-03 ENCOUNTER — Telehealth: Payer: Self-pay | Admitting: *Deleted

## 2020-03-03 DIAGNOSIS — E7800 Pure hypercholesterolemia, unspecified: Secondary | ICD-10-CM | POA: Diagnosis not present

## 2020-03-03 DIAGNOSIS — Z96652 Presence of left artificial knee joint: Secondary | ICD-10-CM | POA: Diagnosis not present

## 2020-03-03 DIAGNOSIS — Z794 Long term (current) use of insulin: Secondary | ICD-10-CM | POA: Diagnosis not present

## 2020-03-03 DIAGNOSIS — Z7982 Long term (current) use of aspirin: Secondary | ICD-10-CM | POA: Diagnosis not present

## 2020-03-03 DIAGNOSIS — E1142 Type 2 diabetes mellitus with diabetic polyneuropathy: Secondary | ICD-10-CM | POA: Diagnosis not present

## 2020-03-03 DIAGNOSIS — I10 Essential (primary) hypertension: Secondary | ICD-10-CM | POA: Diagnosis not present

## 2020-03-03 DIAGNOSIS — Z471 Aftercare following joint replacement surgery: Secondary | ICD-10-CM | POA: Diagnosis not present

## 2020-03-03 DIAGNOSIS — M1711 Unilateral primary osteoarthritis, right knee: Secondary | ICD-10-CM | POA: Diagnosis not present

## 2020-03-03 DIAGNOSIS — K219 Gastro-esophageal reflux disease without esophagitis: Secondary | ICD-10-CM | POA: Diagnosis not present

## 2020-03-03 DIAGNOSIS — J301 Allergic rhinitis due to pollen: Secondary | ICD-10-CM | POA: Diagnosis not present

## 2020-03-03 MED ORDER — OXYCODONE HCL 5 MG PO TABS: 5.0000 mg | ORAL_TABLET | Freq: Four times a day (QID) | ORAL | 0 refills | Status: DC | PRN

## 2020-03-03 NOTE — Telephone Encounter (Signed)
I will send some in. 

## 2020-03-03 NOTE — Telephone Encounter (Signed)
Ortho bundle 7 day call completed. 

## 2020-03-03 NOTE — Telephone Encounter (Signed)
Ortho bundle call: Patient called requesting refill of pain medication. Thanks.

## 2020-03-08 ENCOUNTER — Telehealth: Payer: Self-pay | Admitting: *Deleted

## 2020-03-08 ENCOUNTER — Telehealth: Payer: Self-pay | Admitting: Orthopaedic Surgery

## 2020-03-08 DIAGNOSIS — Z96652 Presence of left artificial knee joint: Secondary | ICD-10-CM | POA: Diagnosis not present

## 2020-03-08 DIAGNOSIS — I10 Essential (primary) hypertension: Secondary | ICD-10-CM | POA: Diagnosis not present

## 2020-03-08 DIAGNOSIS — Z471 Aftercare following joint replacement surgery: Secondary | ICD-10-CM | POA: Diagnosis not present

## 2020-03-08 DIAGNOSIS — J301 Allergic rhinitis due to pollen: Secondary | ICD-10-CM | POA: Diagnosis not present

## 2020-03-08 DIAGNOSIS — Z7982 Long term (current) use of aspirin: Secondary | ICD-10-CM | POA: Diagnosis not present

## 2020-03-08 DIAGNOSIS — K219 Gastro-esophageal reflux disease without esophagitis: Secondary | ICD-10-CM | POA: Diagnosis not present

## 2020-03-08 DIAGNOSIS — M1711 Unilateral primary osteoarthritis, right knee: Secondary | ICD-10-CM | POA: Diagnosis not present

## 2020-03-08 DIAGNOSIS — Z794 Long term (current) use of insulin: Secondary | ICD-10-CM | POA: Diagnosis not present

## 2020-03-08 DIAGNOSIS — E7800 Pure hypercholesterolemia, unspecified: Secondary | ICD-10-CM | POA: Diagnosis not present

## 2020-03-08 DIAGNOSIS — E1142 Type 2 diabetes mellitus with diabetic polyneuropathy: Secondary | ICD-10-CM | POA: Diagnosis not present

## 2020-03-08 MED ORDER — OXYCODONE HCL 5 MG PO TABS: 5.0000 mg | ORAL_TABLET | Freq: Four times a day (QID) | ORAL | 0 refills | Status: DC | PRN

## 2020-03-08 NOTE — Telephone Encounter (Signed)
FYI

## 2020-03-08 NOTE — Telephone Encounter (Signed)
Updated patient.

## 2020-03-08 NOTE — Telephone Encounter (Signed)
Patient called requesting refill of pain medication. Therapy going well he states. Here for appointment this week. Thank you.

## 2020-03-08 NOTE — Telephone Encounter (Signed)
Tom with the [facility] called wanting to make [place] aware that the pt had a fall in the middle of the night and his wife helped him up. Pt didn't seem to be in any pain from the fall and was able to get through his PT.  Toms CB# [phone]

## 2020-03-08 NOTE — Telephone Encounter (Signed)
I sent some in 

## 2020-03-10 ENCOUNTER — Encounter: Payer: Self-pay | Admitting: Physician Assistant

## 2020-03-10 ENCOUNTER — Ambulatory Visit (INDEPENDENT_AMBULATORY_CARE_PROVIDER_SITE_OTHER): Payer: Medicare Other | Admitting: Physician Assistant

## 2020-03-10 ENCOUNTER — Telehealth: Payer: Self-pay | Admitting: *Deleted

## 2020-03-10 DIAGNOSIS — Z96652 Presence of left artificial knee joint: Secondary | ICD-10-CM

## 2020-03-10 NOTE — Progress Notes (Signed)
HPI: Daniel [name] returns today 2 weeks status post left total knee arthroplasty.  He states he had a fall Monday night did not fall on the knee states he got lightheaded this is only time he had a fall.  He has had no fevers chills shortness of breath or chest pain.  He is using his CPM at home.  He is having quite a bit of muscle spasm especially in his hamstrings which were also tight prior to surgery.  He is on aspirin for DVT prophylaxis 325 mg twice daily.  He is taking oxycodone for pain.  Physical exam: Left total knee lacks last 5 to 10 degrees in full extension flexes to 90 degrees.  Calf supple nontender.  Staples well approximate the incision.  There is no signs of infection.  Impression: Status post left total knee arthroplasty 02/26/2020  Plan: We'll continue to work on range of motion and strengthening of the left knee.  He is set up for outpatient therapy we will work on his range of motion strengthening also wants hamstring tightness.  Staples were removed today Steri-Strips applied.  He'll work on scar tissue mobilization.  He'll go back on his regular 81 mg aspirin a week for the next week he'll take 325 mg once daily.  Questions were encouraged and answered at length.  Follow-up in 1 month

## 2020-03-10 NOTE — Telephone Encounter (Signed)
Ortho bundle 14 day call completed. 

## 2020-03-11 DIAGNOSIS — M25662 Stiffness of left knee, not elsewhere classified: Secondary | ICD-10-CM | POA: Diagnosis not present

## 2020-03-11 DIAGNOSIS — M25562 Pain in left knee: Secondary | ICD-10-CM | POA: Diagnosis not present

## 2020-03-11 DIAGNOSIS — M6281 Muscle weakness (generalized): Secondary | ICD-10-CM | POA: Diagnosis not present

## 2020-03-11 DIAGNOSIS — R2689 Other abnormalities of gait and mobility: Secondary | ICD-10-CM | POA: Diagnosis not present

## 2020-03-11 DIAGNOSIS — Z96652 Presence of left artificial knee joint: Secondary | ICD-10-CM | POA: Diagnosis not present

## 2020-03-16 DIAGNOSIS — M25562 Pain in left knee: Secondary | ICD-10-CM | POA: Diagnosis not present

## 2020-03-16 DIAGNOSIS — M6281 Muscle weakness (generalized): Secondary | ICD-10-CM | POA: Diagnosis not present

## 2020-03-16 DIAGNOSIS — Z96652 Presence of left artificial knee joint: Secondary | ICD-10-CM | POA: Diagnosis not present

## 2020-03-16 DIAGNOSIS — R2689 Other abnormalities of gait and mobility: Secondary | ICD-10-CM | POA: Diagnosis not present

## 2020-03-16 DIAGNOSIS — M25662 Stiffness of left knee, not elsewhere classified: Secondary | ICD-10-CM | POA: Diagnosis not present

## 2020-03-20 ENCOUNTER — Other Ambulatory Visit: Payer: Self-pay | Admitting: Family Medicine

## 2020-03-20 ENCOUNTER — Encounter: Payer: Self-pay | Admitting: Family Medicine

## 2020-03-20 DIAGNOSIS — E1142 Type 2 diabetes mellitus with diabetic polyneuropathy: Secondary | ICD-10-CM

## 2020-03-20 NOTE — Telephone Encounter (Signed)
Requested Prescriptions  Pending Prescriptions Disp Refills   metFORMIN (GLUCOPHAGE) 1000 MG tablet [Pharmacy Med Name: metFORMIN HCl 1000 MG Oral Tablet] 180 tablet 0    Sig: TAKE 1 TABLET BY MOUTH TWICE DAILY WITH A MEAL     Endocrinology:  Diabetes - Biguanides Failed - 03/20/2020  3:55 PM      Failed - Cr in normal range and within 360 days    Creat  Date Value Ref Range Status  02/28/2016 0.95 0.70 - 1.25 mg/dL Final    Comment:      For patients > or = 71 years of age: The upper reference limit for Creatinine is approximately 13% higher for people identified as African-American.      Creatinine, Ser  Date Value Ref Range Status  02/27/2020 1.30 (H) 0.61 - 1.24 mg/dL Final         Passed - HBA1C is between 0 and 7.9 and within 180 days    Hemoglobin A1C  Date Value Ref Range Status  01/25/2020 7.4 (A) 4.0 - 5.6 % Final   Hgb A1c MFr Bld  Date Value Ref Range Status  09/07/2019 8.3 (H) 4.8 - 5.6 % Final    Comment:             Prediabetes: 5.7 - 6.4          Diabetes: >6.4          Glycemic control for adults with diabetes: <7.0          Passed - eGFR in normal range and within 360 days    GFR calc Af Amer  Date Value Ref Range Status  02/27/2020 >60 >60 mL/min Final   GFR calc non Af Amer  Date Value Ref Range Status  02/27/2020 55 (L) >60 mL/min Final         Passed - Valid encounter within last 6 months    Recent Outpatient Visits          1 month ago Medicare annual wellness visit, subsequent   [facility], Ranell Patrick, MD   6 months ago Type 2 diabetes mellitus with diabetic polyneuropathy, without long-term current use of insulin [facility])   [facility], Ranell Patrick, MD   10 months ago Type 2 diabetes mellitus with diabetic polyneuropathy, without long-term current use of insulin [facility])   [facility], Ranell Patrick, MD   1 year ago Medicare annual wellness visit, subsequent   [facility],  Ranell Patrick, MD   1 year ago Type 2 diabetes mellitus with diabetic polyneuropathy, without long-term current use of insulin [facility])   [facility], Ranell Patrick, MD      Future Appointments            In 2 weeks Pete Pelt, PA-C [place]   In 1 month Carlota Raspberry Ranell Patrick, MD [facility] [place], [facility]

## 2020-03-21 ENCOUNTER — Other Ambulatory Visit: Payer: Self-pay | Admitting: Orthopaedic Surgery

## 2020-03-21 ENCOUNTER — Telehealth (INDEPENDENT_AMBULATORY_CARE_PROVIDER_SITE_OTHER): Payer: Medicare Other | Admitting: Registered Nurse

## 2020-03-21 ENCOUNTER — Other Ambulatory Visit: Payer: Self-pay

## 2020-03-21 ENCOUNTER — Telehealth: Payer: Self-pay | Admitting: *Deleted

## 2020-03-21 DIAGNOSIS — J22 Unspecified acute lower respiratory infection: Secondary | ICD-10-CM | POA: Diagnosis not present

## 2020-03-21 MED ORDER — GUAIFENESIN-DM 100-10 MG/5ML PO SYRP: 5.0000 mL | ORAL_SOLUTION | ORAL | 0 refills | Status: DC | PRN

## 2020-03-21 MED ORDER — AZITHROMYCIN 250 MG PO TABS: ORAL_TABLET | ORAL | 0 refills | Status: DC

## 2020-03-21 MED ORDER — OXYCODONE HCL 5 MG PO TABS: 5.0000 mg | ORAL_TABLET | Freq: Four times a day (QID) | ORAL | 0 refills | Status: DC | PRN

## 2020-03-21 NOTE — Telephone Encounter (Signed)
Patient called requesting refill of Hydrocodone. Thanks.

## 2020-03-21 NOTE — Progress Notes (Addendum)
Telemedicine Encounter- SOAP NOTE Established Patient  This telephone encounter was conducted with the patient's (or proxy's) verbal consent via audio telecommunications: yes  Patient was instructed to have this encounter in a suitably private space; and to only have persons present to whom they give permission to participate. In addition, patient identity was confirmed by use of name plus two identifiers (DOB and address).  I discussed the limitations, risks, security and privacy concerns of performing an evaluation and management service by telephone and the availability of in person appointments. I also discussed with the patient that there may be a patient responsible charge related to this service. The patient expressed understanding and agreed to proceed.  I spent a total of 13 minutes talking with the patient or their proxy.  Patient at home Provider in office  No chief complaint on file.   Subjective   Daniel [name] is a 69 y.o. established patient. Telephone visit today for cough  HPI Symptoms onset Friday morning. Was not allowed in PT due to symptoms Having productive cough with green/yellow sputum, sore throat, congestion. Feeling warm but no fever. No nausea or vomiting. Some diarrhea but limited. No change to taste or smell. No sick contacts rec'd both covid vaccination shots in March 2021.  Patient Active Problem List   Diagnosis Date Noted  . 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  . Trigger finger, left little finger 02/04/2020  . Carpal tunnel syndrome, left upper limb 11/24/2019  . Unilateral primary osteoarthritis, left knee 11/02/2019  . Unilateral primary osteoarthritis, right knee 11/02/2019  . Carpal tunnel syndrome, right upper limb 02/18/2018  . Anxiety and depression 10/28/2016  . Gastroesophageal reflux disease without esophagitis 10/28/2016  . Seasonal allergic rhinitis due to pollen 10/28/2016  . Primary osteoarthritis of left knee  07/25/2015  . Essential hypertension 07/25/2015  . Hypercholesteremia 07/25/2015  . Type 2 diabetes mellitus with diabetic polyneuropathy, without long-term current use of insulin ([place]) 07/25/2015    Past Medical History:  Diagnosis Date  . Allergic rhinitis   . Allergy   . Anxiety   . Arthritis   . Benign hypertension   . Cataract   . Depression   . Diabetes mellitus ([place])    type 2   . GERD (gastroesophageal reflux disease)   . History of pneumonia   . HLD (hyperlipidemia)   . Neuromuscular disorder ([place])    pt denies at 8/19/visit   . Pneumonia    hx of several times per pt - last episode 4-5 years ago     Current Outpatient Medications  Medication Sig Dispense Refill  . aspirin EC 325 MG EC tablet Take 1 tablet (325 mg total) by mouth 2 (two) times daily after a meal. 30 tablet 0  . azithromycin (ZITHROMAX) 250 MG tablet Take 2 tabs on first day. Then take 1 tab daily. Finish entire supply. 6 tablet 0  . blood glucose meter kit and supplies 1 each by Other route as directed. Dispense based on patient and insurance preference. Check once per day (variable times).  Accu check. 1 each 0  . cetirizine (ZYRTEC) 10 MG tablet Take 10 mg by mouth daily as needed for allergies.     . Cholecalciferol (VITAMIN D) 125 MCG (5000 UT) CAPS Take 5,000 Units by mouth daily.    . Cyanocobalamin (VITAMIN B 12 PO) Take 1,000 mg by mouth daily.     . diclofenac Sodium (VOLTAREN) 1 % GEL Apply 2 g topically daily.    Marland Kitchen  fluticasone (FLONASE) 50 MCG/ACT nasal spray Place 1 spray into both nostrils daily.    Marland Kitchen gabapentin (NEURONTIN) 300 MG capsule Take 1-2 capsules (300-600 mg total) by mouth 2 (two) times daily as needed. (Patient taking differently: Take 300 mg by mouth 2 (two) times daily. ) 180 capsule 3  . glimepiride (AMARYL) 4 MG tablet Take 1 tablet (4 mg total) by mouth 2 (two) times daily. (Patient taking differently: Take 4 mg by mouth daily with breakfast. ) 180 tablet 0  . glucose  blood (ONE TOUCH ULTRA TEST) test strip Test Twice a day as directed    . glucose blood test strip check sugars once daily. Dx: E11.42 100 each 3  . guaiFENesin-dextromethorphan (ROBITUSSIN DM) 100-10 MG/5ML syrup Take 5 mLs by mouth every 4 (four) hours as needed for cough. 236 mL 0  . ibuprofen (ADVIL,MOTRIN) 200 MG tablet Take 400 mg by mouth in the morning and at bedtime.     . Lancets (ONETOUCH DELICA PLUS [id number]) MISC USE 1 TO CHECK GLUCOSE ONCE DAILY    . liraglutide (VICTOZA) 18 MG/3ML SOPN Inject 0.3 mLs (1.8 mg total) into the skin daily. I 3 mL 5  . lisinopril (ZESTRIL) 20 MG tablet Take 1 tablet (20 mg total) by mouth daily. 90 tablet 3  . metFORMIN (GLUCOPHAGE) 1000 MG tablet TAKE 1 TABLET BY MOUTH TWICE DAILY WITH A MEAL 180 tablet 0  . methocarbamol (ROBAXIN) 500 MG tablet Take 1 tablet (500 mg total) by mouth every 6 (six) hours as needed for muscle spasms. 60 tablet 1  . montelukast (SINGULAIR) 10 MG tablet Take 1 tablet (10 mg total) by mouth at bedtime. 90 tablet 1  . Multiple Vitamin (MULTIVITAMIN WITH MINERALS) TABS tablet Take 1 tablet by mouth daily.    Marland Kitchen omeprazole (PRILOSEC) 20 MG capsule Take 1 capsule (20 mg total) by mouth daily. 90 capsule 3  . ondansetron (ZOFRAN ODT) 4 MG disintegrating tablet Take 1 tablet (4 mg total) by mouth every 8 (eight) hours as needed for nausea or vomiting. 20 tablet 0  . ONETOUCH ULTRA test strip USE 1 STRIP TO CHECK GLUCOSE ONCE DAILY 100 each 1  . oxyCODONE (OXY IR/ROXICODONE) 5 MG immediate release tablet Take 1-2 tablets (5-10 mg total) by mouth every 6 (six) hours as needed for moderate pain (pain score 4-6). 30 tablet 0  . pravastatin (PRAVACHOL) 80 MG tablet Take 1 tablet (80 mg total) by mouth daily. 90 tablet 1  . sertraline (ZOLOFT) 100 MG tablet Take 2 tablets (200 mg total) by mouth daily. Start with one per day for initial 2 weeks, then increase to 2 per day. (Patient taking differently: Take 100-200 mg by mouth daily. ) 180  tablet 1  . sodium chloride (OCEAN) 0.65 % SOLN nasal spray Place 1 spray into both nostrils as needed for congestion.    . traMADol (ULTRAM) 50 MG tablet TAKE 1 TABLET BY MOUTH EVERY 6 HOURS AS NEEDED (Patient taking differently: Take 50-100 mg by mouth every 6 (six) hours as needed for moderate pain. ) 120 tablet 0  . vitamin E 180 MG (400 UNITS) capsule Take 400 Units by mouth daily.     No current facility-administered medications for this visit.    Allergies  Allergen Reactions  . Actos [Pioglitazone] Other (See Comments), Shortness Of Breath and Swelling    Blood sugar went up and down and sweating profusely  . Codeine Shortness Of Breath and Palpitations    Social History  Socioeconomic History  . Marital status: Married    Spouse name: Not on file  . Number of children: 3  . Years of education: 32  . Highest education level: Not on file  Occupational History  . Occupation: Dealer, Company secretary  Tobacco Use  . Smoking status: Never Smoker  . Smokeless tobacco: Never Used  Vaping Use  . Vaping Use: Never used  Substance and Sexual Activity  . Alcohol use: No  . Drug use: No  . Sexual activity: Not Currently  Other Topics Concern  . Not on file  Social History Narrative  . Not on file   Social Determinants of Health   Financial Resource Strain:   . Difficulty of Paying Living Expenses: Not on file  Food Insecurity:   . Worried About Charity fundraiser in the Last Year: Not on file  . Ran Out of Food in the Last Year: Not on file  Transportation Needs:   . Lack of Transportation (Medical): Not on file  . Lack of Transportation (Non-Medical): Not on file  Physical Activity:   . Days of Exercise per Week: Not on file  . Minutes of Exercise per Session: Not on file  Stress:   . Feeling of Stress : Not on file  Social Connections:   . Frequency of Communication with Friends and Family: Not on file  . Frequency of Social Gatherings with Friends and  Family: Not on file  . Attends Religious Services: Not on file  . Active Member of Clubs or Organizations: Not on file  . Attends Archivist Meetings: Not on file  . Marital Status: Not on file  Intimate Partner Violence:   . Fear of Current or Ex-Partner: Not on file  . Emotionally Abused: Not on file  . Physically Abused: Not on file  . Sexually Abused: Not on file    ROS Per hpi  Objective   Vitals as reported by the patient: There were no vitals filed for this visit.  Diagnoses and all orders for this visit:  Lower respiratory infection -     azithromycin (ZITHROMAX) 250 MG tablet; Take 2 tabs on first day. Then take 1 tab daily. Finish entire supply. -     guaiFENesin-dextromethorphan (ROBITUSSIN DM) 100-10 MG/5ML syrup; Take 5 mLs by mouth every 4 (four) hours as needed for cough.   PLAN  Suspect lower respiratory infection. Will give zpack and robitussin dm.  Continue montelukast  Deep breathing exercises  Present tomorrow for COVID test - following results resume PT if negative  Patient encouraged to call clinic with any questions, comments, or concerns.  I discussed the assessment and treatment plan with the patient. The patient was provided an opportunity to ask questions and all were answered. The patient agreed with the plan and demonstrated an understanding of the instructions.   The patient was advised to call back or seek an in-person evaluation if the symptoms worsen or if the condition fails to improve as anticipated.  I provided 13 minutes of non-face-to-face time during this encounter.  Maximiano Coss, NP  Primary Care at [facility]

## 2020-03-22 ENCOUNTER — Other Ambulatory Visit: Payer: Medicare Other

## 2020-03-22 ENCOUNTER — Encounter: Payer: Self-pay | Admitting: Registered Nurse

## 2020-03-22 ENCOUNTER — Other Ambulatory Visit: Payer: Self-pay

## 2020-03-22 ENCOUNTER — Ambulatory Visit: Payer: Medicare Other

## 2020-03-22 DIAGNOSIS — Z20822 Contact with and (suspected) exposure to covid-19: Secondary | ICD-10-CM

## 2020-03-23 LAB — NOVEL CORONAVIRUS, NAA: SARS-CoV-2, NAA: NOT DETECTED

## 2020-03-23 LAB — SARS-COV-2, NAA 2 DAY TAT

## 2020-03-30 DIAGNOSIS — R2689 Other abnormalities of gait and mobility: Secondary | ICD-10-CM | POA: Diagnosis not present

## 2020-03-30 DIAGNOSIS — M6281 Muscle weakness (generalized): Secondary | ICD-10-CM | POA: Diagnosis not present

## 2020-03-30 DIAGNOSIS — M25562 Pain in left knee: Secondary | ICD-10-CM | POA: Diagnosis not present

## 2020-03-30 DIAGNOSIS — Z96652 Presence of left artificial knee joint: Secondary | ICD-10-CM | POA: Diagnosis not present

## 2020-03-30 DIAGNOSIS — M25662 Stiffness of left knee, not elsewhere classified: Secondary | ICD-10-CM | POA: Diagnosis not present

## 2020-03-31 ENCOUNTER — Telehealth: Payer: Self-pay | Admitting: *Deleted

## 2020-03-31 ENCOUNTER — Other Ambulatory Visit: Payer: Self-pay | Admitting: Family Medicine

## 2020-03-31 DIAGNOSIS — G8929 Other chronic pain: Secondary | ICD-10-CM

## 2020-03-31 DIAGNOSIS — M25562 Pain in left knee: Secondary | ICD-10-CM

## 2020-03-31 NOTE — Telephone Encounter (Signed)
Patient is requesting a refill of the following medications: Requested Prescriptions   Pending Prescriptions Disp Refills   traMADol (ULTRAM) 50 MG tablet [Pharmacy Med Name: traMADol HCl 50 MG Oral Tablet] 120 tablet 0    Sig: TAKE 1 TABLET BY MOUTH EVERY 6 HOURS AS NEEDED    Date of patient request: 03/31/2020 Last office visit: 02/05/2020 Date of last refill: 02/05/2020 Last refill amount: 120 Follow up time period per chart: N/A

## 2020-03-31 NOTE — Telephone Encounter (Signed)
Attempted 30 day call. No answer and left VM requesting call back.

## 2020-03-31 NOTE — Telephone Encounter (Signed)
Requested medication (s) are due for refill today: yes  Requested medication (s) are on the active medication list: yes  Last refill:  02/05/20  Future visit scheduled: yes  Notes to clinic:  not delegated    Requested Prescriptions  Pending Prescriptions Disp Refills   traMADol (ULTRAM) 50 MG tablet [Pharmacy Med Name: traMADol HCl 50 MG Oral Tablet] 120 tablet 0    Sig: TAKE 1 TABLET BY MOUTH EVERY 6 HOURS AS NEEDED      Not Delegated - Analgesics:  Opioid Agonists Failed - 03/31/2020  5:45 AM      Failed - This refill cannot be delegated      Failed - Urine Drug Screen completed in last 360 days.      Passed - Valid encounter within last 6 months    Recent Outpatient Visits           1 week ago Lower respiratory infection   [facility], Delfino Lovett, NP   2 months ago Medicare annual wellness visit, subsequent   [facility], Ranell Patrick, MD   6 months ago Type 2 diabetes mellitus with diabetic polyneuropathy, without long-term current use of insulin [facility])   [facility] at [facility], Ranell Patrick, MD   10 months ago Type 2 diabetes mellitus with diabetic polyneuropathy, without long-term current use of insulin [facility])   [facility], Ranell Patrick, MD   1 year ago Medicare annual wellness visit, subsequent   [facility], Ranell Patrick, MD       Future Appointments             In 1 week Carlis Abbott Gillermo Murdoch, PA-C [place]   In 3 weeks Wendie Agreste, MD Primary Care at [place], [facility]

## 2020-04-01 ENCOUNTER — Other Ambulatory Visit: Payer: Self-pay | Admitting: Orthopaedic Surgery

## 2020-04-01 ENCOUNTER — Telehealth: Payer: Self-pay | Admitting: *Deleted

## 2020-04-01 MED ORDER — OXYCODONE HCL 5 MG PO TABS: 5.0000 mg | ORAL_TABLET | Freq: Four times a day (QID) | ORAL | 0 refills | Status: DC | PRN

## 2020-04-01 NOTE — Telephone Encounter (Signed)
Ortho [facility] 30 day call. Patient is requesting refill of pain medication. Had a set back as he had a severe respiratory infection (not Covid) and was not able to attend therapy. They really pushed him he says and feels like his progress has stalled. Appt scheduled next week with Artis Delay.

## 2020-04-04 ENCOUNTER — Other Ambulatory Visit: Payer: Self-pay | Admitting: *Deleted

## 2020-04-04 DIAGNOSIS — Z96652 Presence of left artificial knee joint: Secondary | ICD-10-CM

## 2020-04-04 DIAGNOSIS — M1712 Unilateral primary osteoarthritis, left knee: Secondary | ICD-10-CM

## 2020-04-04 NOTE — Care Plan (Signed)
RNCM received call from patient on Friday, 04/01/20 discussing his current physical therapy situation. He has been attending therapy at [facility] in [place] and states that he has not been there very much over the last two weeks due to a respiratory illness (not Covid). He states he was using CPM at home however and performing home exercises as he could. He was seen by a PTA last week, who really "pushed" him and he felt caused him to be in so much pain, that he could "barely walk". Updated MD. Patient did speak to treating PT about this and continued to be frustrated. Patient and RNCM discussed changing therapy locations if he feels he is uncomfortable and unable to make progress. Patient verbalized he wished to do this. Appointment scheduled with [facility] tomorrow at 9:30 am. Updated patient and updated [facility] on change of location. Discussed with [place], lead therapist there. Informed that due to multiple factors, patient is displeased and now will be attending therapy here. Appointment for today canceled. Appointment with Benita Stabile, PA-C scheduled for this week as well.

## 2020-04-05 ENCOUNTER — Encounter: Payer: Self-pay | Admitting: Physical Therapy

## 2020-04-05 ENCOUNTER — Ambulatory Visit: Payer: Medicare Other | Admitting: Physical Therapy

## 2020-04-05 ENCOUNTER — Other Ambulatory Visit: Payer: Self-pay

## 2020-04-05 DIAGNOSIS — R2689 Other abnormalities of gait and mobility: Secondary | ICD-10-CM | POA: Diagnosis not present

## 2020-04-05 DIAGNOSIS — M6281 Muscle weakness (generalized): Secondary | ICD-10-CM

## 2020-04-05 DIAGNOSIS — M25662 Stiffness of left knee, not elsewhere classified: Secondary | ICD-10-CM

## 2020-04-05 DIAGNOSIS — M25562 Pain in left knee: Secondary | ICD-10-CM

## 2020-04-05 NOTE — Therapy (Signed)
[facility] [address] [place], [place], [address] Phone: [phone]   Fax:  [phone]  Physical Therapy Evaluation  Patient Details  Name: Daniel [name] MRN: [id number] Date of Birth: 18-Oct-1950 Referring Provider (PT): Ninfa Linden, MD   Encounter Date: 04/05/2020   PT End of Session - 04/05/20 1031    Visit Number 1    Number of Visits 12    Date for PT Re-Evaluation 05/17/20    Authorization Type UHC MCR    PT Start Time 0932    PT Stop Time 1020    PT Time Calculation (min) 48 min    Activity Tolerance Patient tolerated treatment well    Behavior During Therapy [facility] for tasks assessed/performed           Past Medical History:  Diagnosis Date  . Allergic rhinitis   . Allergy   . Anxiety   . Arthritis   . Benign hypertension   . Cataract   . Depression   . Diabetes mellitus ([place])    type 2   . GERD (gastroesophageal reflux disease)   . History of pneumonia   . HLD (hyperlipidemia)   . Neuromuscular disorder ([place])    pt denies at 8/19/visit   . Pneumonia    hx of several times per pt - last episode 4-5 years ago     Past Surgical History:  Procedure Laterality Date  . CARPAL TUNNEL RELEASE Bilateral 2020 Right, 2021 left  . CATARACT EXTRACTION     bilateral  . EYE SURGERY    . KNEE SURGERY     right  . 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   Procedure: LEFT TOTAL KNEE ARTHROPLASTY;  Surgeon: Mcarthur Rossetti, MD;  Location: [facility];  Service: Orthopedics;  Laterality: Left;    There were no vitals filed for this visit.    Subjective Assessment - 04/05/20 0935    Subjective He had Lt TKA 02/26/20. He had PT somewhere in [place] but did not get to attend much due to respiratory infeciton and then he was not happy there when he did attend. At the advice of his surgeon he was transferred to our PT clinic for new PT eval.    Pertinent History Lt TKA 02/26/20    Limitations Lifting;Standing;Walking;House hold  activities    How long can you stand comfortably? was able to stand 25 min    Patient Stated Goals get more ROM, play golf again    Currently in Pain? Yes    Pain Score --   7 if he does something strenuos, 2 at rest   Pain Location Knee    Pain Orientation Left    Pain Descriptors / Indicators Aching    Pain Type Surgical pain    Pain Onset More than a month ago    Pain Frequency Intermittent    Aggravating Factors  bending, prolonged activity    Pain Relieving Factors heat massage    Multiple Pain Sites No              [facility] PT Assessment - 04/05/20 0001      Assessment   Medical Diagnosis Lt TKA    Referring Provider (PT) Ninfa Linden, MD    Onset Date/Surgical Date 02/20/20    Next MD Visit 04/07/20    Prior Therapy had PT at another place only had a couple visits due to respiratory infection, then was not happy there so relocated to our cliic      Precautions  Precautions None      Restrictions   Weight Bearing Restrictions No      Balance Screen   Has the patient fallen in the past 6 months Yes    How many times? 1    Has the patient had a decrease in activity level because of a fear of falling?  No    Is the patient reluctant to leave their home because of a fear of falling?  No      Home Environment   Living Environment Private residence    Additional Comments has stairs, can do this without many complaints normally      Prior Function   Level of Independence Independent    Vocation Requirements [employer]    Leisure play with grandkids, golf      Cognition   Overall Cognitive Status Within Functional Limits for tasks assessed      Observation/Other Assessments   Observations mild edema in Lt knee      ROM / Strength   AROM / PROM / Strength AROM;PROM;Strength      AROM   AROM Assessment Site Knee    Right/Left Knee Left    Left Knee Flexion 95      PROM   PROM Assessment Site Knee    Right/Left Knee Left    Left Knee Flexion 102      Strength    Overall Strength Comments Lt hip/knee strength overall 4+      Flexibility   Soft Tissue Assessment /Muscle Length --   very tight H.S. and quads     Ambulation/Gait   Ambulation Distance (Feet) 150 Feet    Assistive device Straight cane    Gait Pattern Decreased step length - left;Decreased stance time - left;Decreased hip/knee flexion - left    Gait Comments did not use AD PLOF                      Objective measurements completed on examination: See above findings.       [facility] Treatment/Exercise - 04/05/20 0001      Exercises   Exercises Knee/Hip      Knee/Hip Exercises: Stretches   Hip Flexor Stretch Limitations lunge stretch with Lt foot on 8in step 10 sec X 10 reps      Knee/Hip Exercises: Aerobic   Recumbent Bike L8 rocking 6 min total      Knee/Hip Exercises: Machines for Strengthening   Total Gym Leg Press bilat push 75 lbs 2X10 with slow eccentric lower and holding stretch at bottom 5 sec       Knee/Hip Exercises: Standing   Functional Squat Limitations with UE support 10 reps      Manual Therapy   Manual therapy comments Lt knee PROM for flexion and extension, Mobs for flexion and extension                  PT Education - 04/05/20 1031    Education Details reviewed his current HEP, then added knee lunge stretch with foot on step, and squats at [employer]) Educated Patient    Methods Explanation;Demonstration;Verbal cues    Comprehension Verbalized understanding;Returned demonstration               PT Long Term Goals - 04/05/20 1042      PT LONG TERM GOAL #1   Title Pt will be I and compliant with HEP.    Time 6  Period Weeks    Status New    Target Date 05/17/20      PT LONG TERM GOAL #2   Title Pt will improve Lt knee ROM 3-115 deg to improve function.    Time 6    Period Weeks    Status New      PT LONG TERM GOAL #3   Title Pt will improve Lt knee strength to 5/5 MMT to improve function.    Time  6    Period Weeks    Status New      PT LONG TERM GOAL #4   Title Pt will be able to ambulate community distances no AD    Time 6    Period Weeks    Status New      PT LONG TERM GOAL #5   Title Pt will be able to return to light golfing activity.    Time 6    Period Weeks    Status New                  Plan - 04/05/20 1032    Clinical Impression Statement Pt presents to PT S/P Lt TKA 02/26/20. He has decreased knee ROM, decreased hip/knee strength, difficulty with ambulation, and increased pain limiting his funciton. He will benefit from skilled PT to address his deficits.    Personal Factors and Comorbidities Past/Current Experience    Examination-Activity Limitations Bend;Squat;Stairs;Lift;Stand;Locomotion Level    Examination-Participation Restrictions Church;Cleaning;Community Activity;Driving;Shop;Laundry;Yard Work    Stability/Clinical Decision Making Stable/Uncomplicated    Clinical Decision Making Low    Rehab Potential Good    PT Frequency 2x / week    PT Duration 6 weeks    PT Treatment/Interventions ADLs/Self Care Home Management;Electrical Stimulation;Cryotherapy;Iontophoresis 4mg /ml Dexamethasone;Moist Heat;Ultrasound;Gait training;Stair training;Therapeutic activities;Therapeutic exercise;Balance training;Neuromuscular re-education;Manual techniques;Passive range of motion;Dry needling;Joint Manipulations;Vasopneumatic Device;Taping    PT Next Visit Plan needs Lt knee ROM, and strength    PT Home Exercise Plan reviewed his current HEP (quad sets, heel prop,heelslides, tailgate flexion, then added knee lunge stretch with foot on step, and squats at counter    Consulted and Agree with Plan of Care Patient           Patient will benefit from skilled therapeutic intervention in order to improve the following deficits and impairments:  Abnormal gait, Decreased activity tolerance, Decreased balance, Decreased mobility, Decreased endurance, Decreased range of  motion, Decreased scar mobility, Decreased strength, Increased edema, Hypomobility, Difficulty walking, Increased fascial restricitons, Increased muscle spasms, Impaired flexibility, Postural dysfunction, Pain  Visit Diagnosis: Acute pain of left knee  Stiffness of left knee, not elsewhere classified  Muscle weakness (generalized)  Other abnormalities of gait and mobility     Problem List Patient Active Problem List   Diagnosis Date Noted  . 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  . Trigger finger, left little finger 02/04/2020  . Carpal tunnel syndrome, left upper limb 11/24/2019  . Unilateral primary osteoarthritis, left knee 11/02/2019  . Unilateral primary osteoarthritis, right knee 11/02/2019  . Carpal tunnel syndrome, right upper limb 02/18/2018  . Anxiety and depression 10/28/2016  . Gastroesophageal reflux disease without esophagitis 10/28/2016  . Seasonal allergic rhinitis due to pollen 10/28/2016  . Primary osteoarthritis of left knee 07/25/2015  . Essential hypertension 07/25/2015  . Hypercholesteremia 07/25/2015  . Type 2 diabetes mellitus with diabetic polyneuropathy, without long-term current use of insulin ([place]) 07/25/2015    Silvestre Mesi 04/05/2020, 10:49 AM  [facility] [phone]  [place], [place], [address] Phone: [phone]   Fax:  [phone]  Name: Daniel [name] MRN: [id number] Date of Birth: Dec 02, 1950

## 2020-04-06 ENCOUNTER — Ambulatory Visit (INDEPENDENT_AMBULATORY_CARE_PROVIDER_SITE_OTHER): Payer: Medicare Other | Admitting: Registered Nurse

## 2020-04-06 ENCOUNTER — Other Ambulatory Visit: Payer: Self-pay

## 2020-04-06 DIAGNOSIS — Z23 Encounter for immunization: Secondary | ICD-10-CM

## 2020-04-06 NOTE — Patient Instructions (Signed)
° ° ° °  If you have lab work done today you will be contacted with your lab results within the next 2 weeks.  If you have not heard from us then please contact us. The fastest way to get your results is to register for My Chart. ° ° °IF you received an x-ray today, you will receive an invoice from [facility]. Please contact [facility] at [phone] with questions or concerns regarding your invoice.  ° °IF you received labwork today, you will receive an invoice from [facility]. Please contact [facility] at [phone] with questions or concerns regarding your invoice.  ° °Our billing staff will not be able to assist you with questions regarding bills from these companies. ° °You will be contacted with the lab results as soon as they are available. The fastest way to get your results is to activate your My Chart account. Instructions are located on the last page of this paperwork. If you have not heard from us regarding the results in 2 weeks, please contact this office. °  ° ° ° °

## 2020-04-06 NOTE — Progress Notes (Signed)
flu

## 2020-04-07 ENCOUNTER — Ambulatory Visit (INDEPENDENT_AMBULATORY_CARE_PROVIDER_SITE_OTHER): Payer: Medicare Other | Admitting: Physician Assistant

## 2020-04-07 ENCOUNTER — Encounter: Payer: Self-pay | Admitting: Physician Assistant

## 2020-04-07 ENCOUNTER — Ambulatory Visit: Payer: Medicare Other | Admitting: Physical Therapy

## 2020-04-07 DIAGNOSIS — M25562 Pain in left knee: Secondary | ICD-10-CM | POA: Diagnosis not present

## 2020-04-07 DIAGNOSIS — M25662 Stiffness of left knee, not elsewhere classified: Secondary | ICD-10-CM

## 2020-04-07 DIAGNOSIS — M6281 Muscle weakness (generalized): Secondary | ICD-10-CM

## 2020-04-07 DIAGNOSIS — R2689 Other abnormalities of gait and mobility: Secondary | ICD-10-CM | POA: Diagnosis not present

## 2020-04-07 DIAGNOSIS — Z96652 Presence of left artificial knee joint: Secondary | ICD-10-CM

## 2020-04-07 NOTE — Progress Notes (Signed)
HPI: Mr. Mcneal returns today almost 6 weeks status post left total knee arthroplasty.  He is overall doing well.  He is progressing well with physical therapy.  Taking rare oxycodone, tramadol relieve her pain.  He states he is having minimal pain at this point.  He reports that his range of motion with therapy has been -3-106.  Physical exam: Left knee surgical incisions healing well slight keloid.  Calf supple nontender.  He has full extension and flexion to approximately 110 degrees.  No instability valgus varus stressing.  Impression: Status post left total knee arthroplasty 02/26/2028  Plan: We will continue work with therapy on range of motion strengthening.  Scar tissue mobilization encouraged.  We will see him back in 5 weeks to see how he is progressing.  Questions were encouraged and answered by Dr. Ninfa Linden and myself.

## 2020-04-07 NOTE — Therapy (Signed)
[facility] [place], [place], [address] Phone: [phone]   Fax:  [phone]  Physical Therapy Treatment  Patient Details  Name: Daniel [name] MRN: [id number] Date of Birth: 11-02-1950 Referring Provider (PT): Ninfa Linden, MD   Encounter Date: 04/07/2020   PT End of Session - 04/07/20 1117    Visit Number 2    Number of Visits 12    Date for PT Re-Evaluation 05/17/20    Authorization Type UHC MCR    PT Start Time 4818    PT Stop Time 1100    PT Time Calculation (min) 45 min    Activity Tolerance Patient tolerated treatment well    Behavior During Therapy [facility] for tasks assessed/performed           Past Medical History:  Diagnosis Date   Allergic rhinitis    Allergy    Anxiety    Arthritis    Benign hypertension    Cataract    Depression    Diabetes mellitus ([place])    type 2    GERD (gastroesophageal reflux disease)    History of pneumonia    HLD (hyperlipidemia)    Neuromuscular disorder ([place])    pt denies at 8/19/visit    Pneumonia    hx of several times per pt - last episode 4-5 years ago     Past Surgical History:  Procedure Laterality Date   CARPAL TUNNEL RELEASE Bilateral 2020 Right, 2021 left   CATARACT EXTRACTION     bilateral   EYE SURGERY     KNEE SURGERY     right   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   Procedure: LEFT TOTAL KNEE ARTHROPLASTY;  Surgeon: Mcarthur Rossetti, MD;  Location: [facility];  Service: Orthopedics;  Laterality: Left;    There were no vitals filed for this visit.   Subjective Assessment - 04/07/20 1026    Subjective He relays the pain and stiffness overall feels a little better, had some sorness after last sesison but not too bad    Pertinent History Lt TKA 02/26/20    Limitations Lifting;Standing;Walking;House hold activities    How long can you stand comfortably? was able to stand 25 min    Patient Stated Goals get more ROM, play golf again     Pain Onset More than a month ago              [facility] PT Assessment - 04/07/20 0001      Assessment   Medical Diagnosis Lt TKA    Referring Provider (PT) Ninfa Linden, MD    Onset Date/Surgical Date 02/20/20    Next MD Visit 04/07/20      AROM   Left Knee Extension 7    Left Knee Flexion 99      PROM   Left Knee Extension 3    Left Knee Flexion 106      Strength   Overall Strength Comments Lt hip/knee strength overall 4+                         [facility] Adult PT Treatment/Exercise - 04/07/20 0001      Knee/Hip Exercises: Stretches   Hip Flexor Stretch Limitations --    Knee: Self-Stretch Limitations lunge stretch with Lt foot on 6in step 10 sec X 10 reps. Seated tailgate stretch with self O.P contract-relax 5 sec X 10 reps    Gastroc Stretch Both;3 reps;30 seconds      Knee/Hip  Exercises: Aerobic   Nustep L5 X 6 min      Knee/Hip Exercises: Machines for Strengthening   Total Gym Leg Press bilat push 75 lbs X17 reps with slow eccentric lower, then dropped to 43 lbs Lt leg only 15 reps      Knee/Hip Exercises: Standing   Forward Step Up Left;10 reps;Hand Hold: 1;Step Height: 6"    Functional Squat Limitations with UE support 10 reps, then 5 reps, chair behind him and chair touch as able      Manual Therapy   Manual therapy comments Lt knee PROM for flexion and extension, Mobs for flexion and extension                       PT Long Term Goals - 04/05/20 1042      PT LONG TERM GOAL #1   Title Pt will be I and compliant with HEP.    Time 6    Period Weeks    Status New    Target Date 05/17/20      PT LONG TERM GOAL #2   Title Pt will improve Lt knee ROM 3-115 deg to improve function.    Time 6    Period Weeks    Status New      PT LONG TERM GOAL #3   Title Pt will improve Lt knee strength to 5/5 MMT to improve function.    Time 6    Period Weeks    Status New      PT LONG TERM GOAL #4   Title Pt will be able to ambulate community  distances no AD    Time 6    Period Weeks    Status New      PT LONG TERM GOAL #5   Title Pt will be able to return to light golfing activity.    Time 6    Period Weeks    Status New                 Plan - 04/07/20 1117    Clinical Impression Statement He was able to show improvments already this week in Lt knee ROM. He has good rehab potential at this time. PT will continue to progress his strength, ROM, and function to his tolreance.    Personal Factors and Comorbidities Past/Current Experience    Examination-Activity Limitations Bend;Squat;Stairs;Lift;Stand;Locomotion Level    Examination-Participation Restrictions Church;Cleaning;Community Activity;Driving;Shop;Laundry;Yard Work    Stability/Clinical Decision Making Stable/Uncomplicated    Rehab Potential Good    PT Frequency 2x / week    PT Duration 6 weeks    PT Treatment/Interventions ADLs/Self Care Home Management;Electrical Stimulation;Cryotherapy;Iontophoresis 4mg /ml Dexamethasone;Moist Heat;Ultrasound;Gait training;Stair training;Therapeutic activities;Therapeutic exercise;Balance training;Neuromuscular re-education;Manual techniques;Passive range of motion;Dry needling;Joint Manipulations;Vasopneumatic Device;Taping    PT Next Visit Plan needs Lt knee ROM, and strength    PT Home Exercise Plan reviewed his current HEP (quad sets, heel prop,heelslides, tailgate flexion, then added knee lunge stretch with foot on step, and squats at counter    Consulted and Agree with Plan of Care Patient           Patient will benefit from skilled therapeutic intervention in order to improve the following deficits and impairments:  Abnormal gait, Decreased activity tolerance, Decreased balance, Decreased mobility, Decreased endurance, Decreased range of motion, Decreased scar mobility, Decreased strength, Increased edema, Hypomobility, Difficulty walking, Increased fascial restricitons, Increased muscle spasms, Impaired flexibility,  Postural dysfunction, Pain  Visit Diagnosis: Acute pain of left  knee  Stiffness of left knee, not elsewhere classified  Muscle weakness (generalized)  Other abnormalities of gait and mobility     Problem List 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   Trigger finger, left little finger 02/04/2020   Carpal tunnel syndrome, left upper limb 11/24/2019   Unilateral primary osteoarthritis, left knee 11/02/2019   Unilateral primary osteoarthritis, right knee 11/02/2019   Carpal tunnel syndrome, right upper limb 02/18/2018   Anxiety and depression 10/28/2016   Gastroesophageal reflux disease without esophagitis 10/28/2016   Seasonal allergic rhinitis due to pollen 10/28/2016   Primary osteoarthritis of left knee 07/25/2015   Essential hypertension 07/25/2015   Hypercholesteremia 07/25/2015   Type 2 diabetes mellitus with diabetic polyneuropathy, without long-term current use of insulin ([place]) 07/25/2015    Silvestre Mesi 04/07/2020, 11:20 AM  [facility] Physical Therapy [address] [place], [place], [address] Phone: [phone]   Fax:  [phone]  Name: Daniel [name] MRN: [id number] Date of Birth: 01/18/1951

## 2020-04-12 ENCOUNTER — Other Ambulatory Visit: Payer: Self-pay

## 2020-04-12 ENCOUNTER — Ambulatory Visit: Payer: Medicare Other | Admitting: Physical Therapy

## 2020-04-12 DIAGNOSIS — M25562 Pain in left knee: Secondary | ICD-10-CM

## 2020-04-12 DIAGNOSIS — M25662 Stiffness of left knee, not elsewhere classified: Secondary | ICD-10-CM

## 2020-04-12 DIAGNOSIS — M6281 Muscle weakness (generalized): Secondary | ICD-10-CM

## 2020-04-12 DIAGNOSIS — R2689 Other abnormalities of gait and mobility: Secondary | ICD-10-CM | POA: Diagnosis not present

## 2020-04-12 NOTE — Therapy (Signed)
[facility] Physical Therapy [address] [place], [place], [address] Phone: [phone]   Fax:  [phone]  Physical Therapy Treatment  Patient Details  Name: Daniel [name] MRN: [id number] Date of Birth: [birth year] Referring Provider (PT): Ninfa Linden, MD   Encounter Date: 04/12/2020   PT End of Session - 04/12/20 1201    Visit Number 3    Number of Visits 12    Date for PT Re-Evaluation 05/17/20    Authorization Type UHC MCR    PT Start Time 1102    PT Stop Time 1150    PT Time Calculation (min) 48 min    Activity Tolerance Patient tolerated treatment well    Behavior During Therapy [facility] for tasks assessed/performed           Past Medical History:  Diagnosis Date  . Allergic rhinitis   . Allergy   . Anxiety   . Arthritis   . Benign hypertension   . Cataract   . Depression   . Diabetes mellitus ([place])    type 2   . GERD (gastroesophageal reflux disease)   . History of pneumonia   . HLD (hyperlipidemia)   . Neuromuscular disorder ([place])    pt denies at 8/19/visit   . Pneumonia    hx of several times per pt - last episode 4-5 years ago     Past Surgical History:  Procedure Laterality Date  . CARPAL TUNNEL RELEASE Bilateral 2020 Right, 2021 left  . CATARACT EXTRACTION     bilateral  . EYE SURGERY    . KNEE SURGERY     right  . 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   Procedure: LEFT TOTAL KNEE ARTHROPLASTY;  Surgeon: Mcarthur Rossetti, MD;  Location: [facility];  Service: Orthopedics;  Laterality: Left;    There were no vitals filed for this visit.   Subjective Assessment - 04/12/20 1121    Subjective relays he had a bad day yesterday with pain and stiffness and is not sure why, rates 8/10 overall knee pain.    Pertinent History Lt TKA 02/26/20    Limitations Lifting;Standing;Walking;House hold activities    How long can you stand comfortably? was able to stand 25 min    Patient Stated Goals get more ROM, play golf again    Pain  Onset More than a month ago             [facility] Adult PT Treatment/Exercise - 04/12/20 0001      Knee/Hip Exercises: Stretches   Knee: Self-Stretch Limitations lunge stretch with Lt foot in chair 10 sec X 10 reps. Seated tailgate stretch with self O.P contract-relax 10 sec X 10 reps    Gastroc Stretch Both;2 reps;30 seconds      Knee/Hip Exercises: Aerobic   Nustep L5 X 8 min      Knee/Hip Exercises: Machines for Strengthening   Total Gym Leg Press bilat push 75 lbs 2X10 reps with slow eccentric lower, then dropped to 50 lbs Lt leg only 2X10 reps      Knee/Hip Exercises: Standing   Lateral Step Up Left;10 reps;Hand Hold: 1;Step Height: 6"    Forward Step Up Left;10 reps;Hand Hold: 1;Step Height: 6"      Modalities   Modalities Moist Heat;Electrical Stimulation      Moist Heat Therapy   Number Minutes Moist Heat 10 Minutes    Moist Heat Location Knee      Electrical Stimulation   Electrical Stimulation Location Lt knee  Electrical Stimulation Action IFC    Electrical Stimulation Parameters tolerance    Electrical Stimulation Goals Pain      Manual Therapy   Manual therapy comments Lt knee PROM for flexion and extension, Mobs for flexion and extension                       PT Long Term Goals - 04/05/20 1042      PT LONG TERM GOAL #1   Title Pt will be I and compliant with HEP.    Time 6    Period Weeks    Status New    Target Date 05/17/20      PT LONG TERM GOAL #2   Title Pt will improve Lt knee ROM 3-115 deg to improve function.    Time 6    Period Weeks    Status New      PT LONG TERM GOAL #3   Title Pt will improve Lt knee strength to 5/5 MMT to improve function.    Time 6    Period Weeks    Status New      PT LONG TERM GOAL #4   Title Pt will be able to ambulate community distances no AD    Time 6    Period Weeks    Status New      PT LONG TERM GOAL #5   Title Pt will be able to return to light golfing activity.    Time 6    Period  Weeks    Status New                 Plan - 04/12/20 1201    Clinical Impression Statement Continued to focus on Lt knee ROM and strength to his tolerance. He was in more overall pain upon arrival so used heat and TENS post tx to reduce ovearll pain .He relays this did in fact help. Continue POC    Personal Factors and Comorbidities Past/Current Experience    Examination-Activity Limitations Bend;Squat;Stairs;Lift;Stand;Locomotion Level    Examination-Participation Restrictions Church;Cleaning;Community Activity;Driving;Shop;Laundry;Yard Work    Stability/Clinical Decision Making Stable/Uncomplicated    Rehab Potential Good    PT Frequency 2x / week    PT Duration 6 weeks    PT Treatment/Interventions ADLs/Self Care Home Management;Electrical Stimulation;Cryotherapy;Iontophoresis 4mg /ml Dexamethasone;Moist Heat;Ultrasound;Gait training;Stair training;Therapeutic activities;Therapeutic exercise;Balance training;Neuromuscular re-education;Manual techniques;Passive range of motion;Dry needling;Joint Manipulations;Vasopneumatic Device;Taping    PT Next Visit Plan needs Lt knee ROM, and strength    PT Home Exercise Plan reviewed his current HEP (quad sets, heel prop,heelslides, tailgate flexion, then added knee lunge stretch with foot on step, and squats at counter    Consulted and Agree with Plan of Care Patient           Patient will benefit from skilled therapeutic intervention in order to improve the following deficits and impairments:  Abnormal gait, Decreased activity tolerance, Decreased balance, Decreased mobility, Decreased endurance, Decreased range of motion, Decreased scar mobility, Decreased strength, Increased edema, Hypomobility, Difficulty walking, Increased fascial restricitons, Increased muscle spasms, Impaired flexibility, Postural dysfunction, Pain  Visit Diagnosis: Acute pain of left knee  Stiffness of left knee, not elsewhere classified  Muscle weakness  (generalized)  Other abnormalities of gait and mobility     Problem List Patient Active Problem List   Diagnosis Date Noted  . 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  . Trigger finger, left little finger 02/04/2020  . Carpal tunnel syndrome, left upper limb 11/24/2019  .  Unilateral primary osteoarthritis, left knee 11/02/2019  . Unilateral primary osteoarthritis, right knee 11/02/2019  . Carpal tunnel syndrome, right upper limb 02/18/2018  . Anxiety and depression 10/28/2016  . Gastroesophageal reflux disease without esophagitis 10/28/2016  . Seasonal allergic rhinitis due to pollen 10/28/2016  . Primary osteoarthritis of left knee 07/25/2015  . Essential hypertension 07/25/2015  . Hypercholesteremia 07/25/2015  . Type 2 diabetes mellitus with diabetic polyneuropathy, without long-term current use of insulin ([place]) 07/25/2015  . Pollen allergies 02/10/2014  . Allergic rhinitis due to animal (cat) (dog) hair and dander 09/15/2012  . Extrinsic asthma 09/13/2011  . Proteinuria 11/24/2010    Silvestre Mesi 04/12/2020, 12:04 PM  [facility] Physical Therapy [address] [place], [place], [address] Phone: [phone]   Fax:  [phone]  Name: ALDRIN ENGELHARD MRN: [id number] Date of Birth: 05-19-1951

## 2020-04-15 ENCOUNTER — Encounter: Payer: Medicare Other | Admitting: Physical Therapy

## 2020-04-16 ENCOUNTER — Ambulatory Visit: Payer: Medicare Other

## 2020-04-22 ENCOUNTER — Ambulatory Visit: Payer: Medicare Other | Admitting: Physical Therapy

## 2020-04-22 ENCOUNTER — Other Ambulatory Visit: Payer: Self-pay

## 2020-04-22 DIAGNOSIS — R2689 Other abnormalities of gait and mobility: Secondary | ICD-10-CM | POA: Diagnosis not present

## 2020-04-22 DIAGNOSIS — M6281 Muscle weakness (generalized): Secondary | ICD-10-CM

## 2020-04-22 DIAGNOSIS — M25662 Stiffness of left knee, not elsewhere classified: Secondary | ICD-10-CM | POA: Diagnosis not present

## 2020-04-22 DIAGNOSIS — M25562 Pain in left knee: Secondary | ICD-10-CM

## 2020-04-22 NOTE — Therapy (Signed)
[facility] Physical Therapy [address] [place], [place], [address] Phone: [phone]   Fax:  [phone]  Physical Therapy Treatment  Patient Details  Name: Daniel [name] MRN: [id number] Date of Birth: [birth year] Referring Provider (PT): Ninfa Linden, MD   Encounter Date: 04/22/2020   PT End of Session - 04/22/20 1101    Visit Number 4    Number of Visits 12    Date for PT Re-Evaluation 05/17/20    Authorization Type UHC MCR    PT Start Time 7858    PT Stop Time 1055    PT Time Calculation (min) 40 min    Activity Tolerance Patient tolerated treatment well    Behavior During Therapy [facility] for tasks assessed/performed           Past Medical History:  Diagnosis Date  . Allergic rhinitis   . Allergy   . Anxiety   . Arthritis   . Benign hypertension   . Cataract   . Depression   . Diabetes mellitus ([place])    type 2   . GERD (gastroesophageal reflux disease)   . History of pneumonia   . HLD (hyperlipidemia)   . Neuromuscular disorder ([place])    pt denies at 8/19/visit   . Pneumonia    hx of several times per pt - last episode 4-5 years ago     Past Surgical History:  Procedure Laterality Date  . CARPAL TUNNEL RELEASE Bilateral 2020 Right, 2021 left  . CATARACT EXTRACTION     bilateral  . EYE SURGERY    . KNEE SURGERY     right  . 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   Procedure: LEFT TOTAL KNEE ARTHROPLASTY;  Surgeon: Mcarthur Rossetti, MD;  Location: [facility];  Service: Orthopedics;  Laterality: Left;    There were no vitals filed for this visit.   Subjective Assessment - 04/22/20 1036    Subjective relays the Lt knee is not that painful today but it feels stiff. Relays biggest complaint today is pain in Right leg and he is having heart flutters but will be checked out by cardiologist    Pertinent History Lt TKA 02/26/20    Limitations Lifting;Standing;Walking;House hold activities    How long can you stand comfortably? was able to  stand 25 min    Patient Stated Goals get more ROM, play golf again    Pain Onset More than a month ago              [facility] PT Assessment - 04/22/20 0001      Assessment   Medical Diagnosis Lt TKA    Referring Provider (PT) Ninfa Linden, MD    Onset Date/Surgical Date 02/20/20    Next MD Visit 05/05/20      AROM   Left Knee Extension 4    Left Knee Flexion 110      PROM   Left Knee Flexion 119      Strength   Overall Strength Comments Lt hip/knee strength overall 4+                         [facility] Adult PT Treatment/Exercise - 04/22/20 0001      Knee/Hip Exercises: Stretches   Active Hamstring Stretch Right;10 seconds   10 reps   Active Hamstring Stretch Limitations then both in seated position 30 sec X 3    Knee: Self-Stretch Limitations lunge stretch with Lt foot in chair 10 sec X 10 reps. Seated  tailgate stretch with self O.P contract-relax 10 sec X 3 min    Other Knee/Hip Stretches supine TKE stretch with 5 lb weight 4 min    Other Knee/Hip Stretches supine heelslides AAROM with strap 10 sec X 3 min      Knee/Hip Exercises: Aerobic   Recumbent Bike seat 9 full revolutions 8 min      Manual Therapy   Manual therapy comments Lt knee PROM for flexion and extension, Mobs for flexion and extension                       PT Long Term Goals - 04/22/20 1104      PT LONG TERM GOAL #1   Title Pt will be I and compliant with HEP.    Time 6    Period Weeks    Status On-going      PT LONG TERM GOAL #2   Title Pt will improve Lt knee ROM 3-115 deg to improve function.    Time 6    Period Weeks    Status On-going      PT LONG TERM GOAL #3   Title Pt will improve Lt knee strength to 5/5 MMT to improve function.    Time 6    Period Weeks    Status On-going      PT LONG TERM GOAL #4   Title Pt will be able to ambulate community distances no AD    Time 6    Period Weeks    Status On-going      PT LONG TERM GOAL #5   Title Pt will be able to  return to light golfing activity.    Time 6    Period Weeks    Status On-going                 Plan - 04/22/20 1102    Clinical Impression Statement He had some heart flutters with exercise so this was discontinued and moved to sitting/supine for remainder of session to focus on stretching and ROM. He will see MD about heart symptoms monday. His ROM measurments did improve especially with flexion ROM.    Personal Factors and Comorbidities Past/Current Experience    Examination-Activity Limitations Bend;Squat;Stairs;Lift;Stand;Locomotion Level    Examination-Participation Restrictions Church;Cleaning;Community Activity;Driving;Shop;Laundry;Yard Work    Stability/Clinical Decision Making Stable/Uncomplicated    Rehab Potential Good    PT Frequency 2x / week    PT Duration 6 weeks    PT Treatment/Interventions ADLs/Self Care Home Management;Electrical Stimulation;Cryotherapy;Iontophoresis 4mg /ml Dexamethasone;Moist Heat;Ultrasound;Gait training;Stair training;Therapeutic activities;Therapeutic exercise;Balance training;Neuromuscular re-education;Manual techniques;Passive range of motion;Dry needling;Joint Manipulations;Vasopneumatic Device;Taping    PT Next Visit Plan what did cardiologist say? needs Lt knee ROM, and strength    PT Home Exercise Plan reviewed his current HEP (quad sets, heel prop,heelslides, tailgate flexion, then added knee lunge stretch with foot on step, and squats at counter    Consulted and Agree with Plan of Care Patient           Patient will benefit from skilled therapeutic intervention in order to improve the following deficits and impairments:  Abnormal gait, Decreased activity tolerance, Decreased balance, Decreased mobility, Decreased endurance, Decreased range of motion, Decreased scar mobility, Decreased strength, Increased edema, Hypomobility, Difficulty walking, Increased fascial restricitons, Increased muscle spasms, Impaired flexibility, Postural  dysfunction, Pain  Visit Diagnosis: Acute pain of left knee  Stiffness of left knee, not elsewhere classified  Muscle weakness (generalized)  Other abnormalities of gait and mobility  Problem List Patient Active Problem List   Diagnosis Date Noted  . 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  . Trigger finger, left little finger 02/04/2020  . Carpal tunnel syndrome, left upper limb 11/24/2019  . Unilateral primary osteoarthritis, left knee 11/02/2019  . Unilateral primary osteoarthritis, right knee 11/02/2019  . Carpal tunnel syndrome, right upper limb 02/18/2018  . Anxiety and depression 10/28/2016  . Gastroesophageal reflux disease without esophagitis 10/28/2016  . Seasonal allergic rhinitis due to pollen 10/28/2016  . Primary osteoarthritis of left knee 07/25/2015  . Essential hypertension 07/25/2015  . Hypercholesteremia 07/25/2015  . Type 2 diabetes mellitus with diabetic polyneuropathy, without long-term current use of insulin ([place]) 07/25/2015  . Pollen allergies 02/10/2014  . Allergic rhinitis due to animal (cat) (dog) hair and dander 09/15/2012  . Extrinsic asthma 09/13/2011  . Proteinuria 11/24/2010    Silvestre Mesi 04/22/2020, 11:04 AM  [facility] Physical Therapy [address] [place], [place], [address] Phone: [phone]   Fax:  [phone]  Name: Daniel [name] MRN: [id number] Date of Birth: 27-Apr-1951

## 2020-04-25 ENCOUNTER — Other Ambulatory Visit: Payer: Self-pay

## 2020-04-25 ENCOUNTER — Ambulatory Visit (INDEPENDENT_AMBULATORY_CARE_PROVIDER_SITE_OTHER): Payer: Medicare Other | Admitting: Family Medicine

## 2020-04-25 ENCOUNTER — Encounter: Payer: Self-pay | Admitting: Family Medicine

## 2020-04-25 VITALS — BP 136/74 | HR 74 | Temp 98.2°F | Ht 68.0 in | Wt 231.0 lb

## 2020-04-25 DIAGNOSIS — I10 Essential (primary) hypertension: Secondary | ICD-10-CM | POA: Diagnosis not present

## 2020-04-25 DIAGNOSIS — E1142 Type 2 diabetes mellitus with diabetic polyneuropathy: Secondary | ICD-10-CM | POA: Diagnosis not present

## 2020-04-25 DIAGNOSIS — R7989 Other specified abnormal findings of blood chemistry: Secondary | ICD-10-CM | POA: Diagnosis not present

## 2020-04-25 LAB — POCT GLYCOSYLATED HEMOGLOBIN (HGB A1C): Hemoglobin A1C: 7 % — AB (ref 4.0–5.6)

## 2020-04-25 LAB — GLUCOSE, POCT (MANUAL RESULT ENTRY): POC Glucose: 131 mg/dl — AB (ref 70–99)

## 2020-04-25 NOTE — Progress Notes (Signed)
Subjective:  Patient ID: Daniel [name], male    DOB: Jul 09, 1950  Age: 69 y.o. MRN: [id number]  CC:  Chief Complaint  Patient presents with  . Diabetes    PT is here to follow up on this condition. pt reports no issues with condition, and checks is BS regualy. PT reports he gets readings of80-169m/dl  fasting when he checks his BS. pt is fasting.    HPI DMANU RUBEYpresents for   Diabetes: Complicated by hyperglycemia, polyneuropathy, microalbuminuria.  Neuropathy treatment with gabapentin 300 mg every morning, 600 mg in the afternoon. Metformin 1000 mg twice daily, Victoza 1.8 mg daily, glimepiride 4 mg twice daily.  He is on statin and ACE inhibitor. Total left knee replacement August 27. Still on tramadol - 2-3 per day for R knee - plan on replacement in next year possibly.  A1c had improved to 7.4 in July, down from 8.3. Home readings: Fasting 80-140. High of 160.Rare 60 - in am first thing - every few weeks.  Resolved with breakfast.   Microalbumin: Ratio 25 in July. Optho, foot exam, pneumovax: Foot exam today, due for ophthalmology visit - plans to schedule.   Diabetic Foot Exam - Simple   Simple Foot Form Diabetic Foot exam was performed with the following findings: Yes 04/25/2020  8:43 AM  Visual Inspection No deformities, no ulcerations, no other skin breakdown bilaterally: Yes Sensation Testing Intact to touch and monofilament testing bilaterally: Yes Pulse Check Posterior Tibialis and Dorsalis pulse intact bilaterally: Yes Comments Normal Findings      Lab Results  Component Value Date   HGBA1C 7.0 (A) 04/25/2020   HGBA1C 7.4 (A) 01/25/2020   HGBA1C 8.3 (H) 09/07/2019   Lab Results  Component Value Date   MICROALBUR 15.1 05/03/2015   LDLCALC 102 (H) 01/25/2020   CREATININE 1.30 (H) 02/27/2020    Hypertension: Lisinopril 280mqd.  Home readings - 135/72.  Creat increased 8/28 - 1.3 up from 1.01-1.22 prior.  BP Readings from Last 3 Encounters:    04/25/20 136/74  02/27/20 (!) 160/80  02/18/20 (!) 141/77   Lab Results  Component Value Date   CREATININE 1.30 (H) 02/27/2020      History Patient Active Problem List   Diagnosis Date Noted  . 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  . Trigger finger, left little finger 02/04/2020  . Carpal tunnel syndrome, left upper limb 11/24/2019  . Unilateral primary osteoarthritis, left knee 11/02/2019  . Unilateral primary osteoarthritis, right knee 11/02/2019  . Carpal tunnel syndrome, right upper limb 02/18/2018  . Anxiety and depression 10/28/2016  . Gastroesophageal reflux disease without esophagitis 10/28/2016  . Seasonal allergic rhinitis due to pollen 10/28/2016  . Primary osteoarthritis of left knee 07/25/2015  . Essential hypertension 07/25/2015  . Hypercholesteremia 07/25/2015  . Type 2 diabetes mellitus with diabetic polyneuropathy, without long-term current use of insulin ([place]01/23/2017  . Pollen allergies 02/10/2014  . Allergic rhinitis due to animal (cat) (dog) hair and dander 09/15/2012  . Extrinsic asthma 09/13/2011  . Proteinuria 11/24/2010   Past Medical History:  Diagnosis Date  . Allergic rhinitis   . Allergy   . Anxiety   . Arthritis   . Benign hypertension   . Cataract   . Depression   . Diabetes mellitus ([place]   type 2   . GERD (gastroesophageal reflux disease)   . History of pneumonia   . HLD (hyperlipidemia)   . Neuromuscular disorder ([place]   pt  denies at 8/19/visit   . Pneumonia    hx of several times per pt - last episode 4-5 years ago    Past Surgical History:  Procedure Laterality Date  . CARPAL TUNNEL RELEASE Bilateral 2020 Right, 2021 left  . CATARACT EXTRACTION     bilateral  . EYE SURGERY    . KNEE SURGERY     right  . 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   Procedure: LEFT TOTAL KNEE ARTHROPLASTY;  Surgeon: Mcarthur Rossetti, MD;  Location: [facility];  Service: Orthopedics;  Laterality: Left;   Allergies   Allergen Reactions  . Actos [Pioglitazone] Other (See Comments), Shortness Of Breath and Swelling    Blood sugar went up and down and sweating profusely  . Codeine Shortness Of Breath and Palpitations   Prior to Admission medications   Medication Sig Start Date End Date Taking? Authorizing Provider  aspirin EC 325 MG EC tablet Take 1 tablet (325 mg total) by mouth 2 (two) times daily after a meal. 02/27/20  Yes Mcarthur Rossetti, MD  azithromycin (ZITHROMAX) 250 MG tablet Take 2 tabs on first day. Then take 1 tab daily. Finish entire supply. 03/21/20  Yes Maximiano Coss, NP  blood glucose meter kit and supplies 1 each by Other route as directed. Dispense based on patient and insurance preference. Check once per day (variable times).  Accu check. 07/29/18  Yes Wendie Agreste, MD  cetirizine (ZYRTEC) 10 MG tablet Take 10 mg by mouth daily as needed for allergies.    Yes [provider]  Cholecalciferol (VITAMIN D) 125 MCG (5000 UT) CAPS Take 5,000 Units by mouth daily.   Yes [provider]  Cyanocobalamin (VITAMIN B 12 PO) Take 1,000 mg by mouth daily.    Yes [provider]  diclofenac Sodium (VOLTAREN) 1 % GEL Apply 2 g topically daily.   Yes [provider]  fluticasone (FLONASE) 50 MCG/ACT nasal spray Place 1 spray into both nostrils daily.   Yes [provider]  gabapentin (NEURONTIN) 300 MG capsule Take 1-2 capsules (300-600 mg total) by mouth 2 (two) times daily as needed. Patient taking differently: Take 300 mg by mouth 2 (two) times daily.  01/25/20  Yes Wendie Agreste, MD  glimepiride (AMARYL) 4 MG tablet Take 1 tablet (4 mg total) by mouth 2 (two) times daily. Patient taking differently: Take 4 mg by mouth daily with breakfast.  01/25/20  Yes Wendie Agreste, MD  guaiFENesin-dextromethorphan [facility] DM) 100-10 MG/5ML syrup Take 5 mLs by mouth every 4 (four) hours as needed for cough. 03/21/20  Yes Maximiano Coss, NP   ibuprofen (ADVIL,MOTRIN) 200 MG tablet Take 400 mg by mouth in the morning and at bedtime.    Yes [provider]  Lancets (ONETOUCH DELICA PLUS LMBEML54G) [place] USE 1 TO CHECK GLUCOSE ONCE DAILY 08/04/18  Yes [provider]  liraglutide (VICTOZA) 18 MG/3ML SOPN Inject 0.3 mLs (1.8 mg total) into the skin daily. I 01/25/20  Yes Wendie Agreste, MD  lisinopril (ZESTRIL) 20 MG tablet Take 1 tablet (20 mg total) by mouth daily. 09/07/19  Yes Wendie Agreste, MD  metFORMIN (GLUCOPHAGE) 1000 MG tablet TAKE 1 TABLET BY MOUTH TWICE DAILY WITH A MEAL 03/20/20  Yes Wendie Agreste, MD  methocarbamol (ROBAXIN) 500 MG tablet Take 1 tablet (500 mg total) by mouth every 6 (six) hours as needed for muscle spasms. 02/27/20  Yes Mcarthur Rossetti, MD  montelukast (SINGULAIR) 10 MG tablet Take 1  tablet (10 mg total) by mouth at bedtime. 09/07/19  Yes Wendie Agreste, MD  Multiple Vitamin (MULTIVITAMIN WITH MINERALS) TABS tablet Take 1 tablet by mouth daily.   Yes [provider]  omeprazole (PRILOSEC) 20 MG capsule Take 1 capsule (20 mg total) by mouth daily. 09/07/19  Yes Wendie Agreste, MD  ondansetron (ZOFRAN ODT) 4 MG disintegrating tablet Take 1 tablet (4 mg total) by mouth every 8 (eight) hours as needed for nausea or vomiting. 02/27/20  Yes Mcarthur Rossetti, MD  [facility] ULTRA test strip USE 1 STRIP TO CHECK GLUCOSE ONCE DAILY 11/25/18  Yes Wendie Agreste, MD  oxyCODONE (OXY IR/ROXICODONE) 5 MG immediate release tablet Take 1-2 tablets (5-10 mg total) by mouth every 6 (six) hours as needed for moderate pain (pain score 4-6). 04/01/20  Yes Mcarthur Rossetti, MD  pravastatin (PRAVACHOL) 80 MG tablet Take 1 tablet (80 mg total) by mouth daily. 09/07/19  Yes Wendie Agreste, MD  sertraline (ZOLOFT) 100 MG tablet Take 2 tablets (200 mg total) by mouth daily. Start with one per day for initial 2 weeks, then increase to 2 per day. Patient taking differently: Take 100-200  mg by mouth daily.  09/07/19  Yes Wendie Agreste, MD  sodium chloride (OCEAN) 0.65 % SOLN nasal spray Place 1 spray into both nostrils as needed for congestion.   Yes [provider]  traMADol (ULTRAM) 50 MG tablet TAKE 1 TABLET BY MOUTH EVERY 6 HOURS AS NEEDED 03/31/20  Yes Maximiano Coss, NP  vitamin E 180 MG (400 UNITS) capsule Take 400 Units by mouth daily.   Yes [provider]   Social History   Socioeconomic History  . Marital status: Married    Spouse name: Not on file  . Number of children: 3  . Years of education: 64  . Highest education level: Not on file  Occupational History  . Occupation: Dealer, Company secretary  Tobacco Use  . Smoking status: Never Smoker  . Smokeless tobacco: Never Used  Vaping Use  . Vaping Use: Never used  Substance and Sexual Activity  . Alcohol use: No  . Drug use: No  . Sexual activity: Not Currently  Other Topics Concern  . Not on file  Social History Narrative  . Not on file   Social Determinants of Health   Financial Resource Strain:   . Difficulty of Paying Living Expenses: Not on file  Food Insecurity:   . Worried About Charity fundraiser in the Last Year: Not on file  . Ran Out of Food in the Last Year: Not on file  Transportation Needs:   . Lack of Transportation (Medical): Not on file  . Lack of Transportation (Non-Medical): Not on file  Physical Activity:   . Days of Exercise per Week: Not on file  . Minutes of Exercise per Session: Not on file  Stress:   . Feeling of Stress : Not on file  Social Connections:   . Frequency of Communication with Friends and Family: Not on file  . Frequency of Social Gatherings with Friends and Family: Not on file  . Attends Religious Services: Not on file  . Active Member of Clubs or Organizations: Not on file  . Attends Archivist Meetings: Not on file  . Marital Status: Not on file  Intimate Partner Violence:   . Fear of Current or Ex-Partner: Not on  file  . Emotionally Abused: Not on file  . Physically Abused:  Not on file  . Sexually Abused: Not on file    Review of Systems  Constitutional: Negative for fatigue and unexpected weight change.  Eyes: Negative for visual disturbance.  Respiratory: Negative for cough, chest tightness and shortness of breath.   Cardiovascular: Negative for chest pain, palpitations and leg swelling.  Gastrointestinal: Negative for abdominal pain and blood in stool.  Neurological: Negative for dizziness, light-headedness and headaches.     Objective:   Vitals:   04/25/20 0815 04/25/20 0816 04/25/20 0917  BP: (!) 168/78 (!) 144/80 136/74  Pulse: 74    Temp: 98.2 F (36.8 C)    TempSrc: Temporal    SpO2: 97%    Weight: 231 lb (104.8 kg)    Height: _0  (1.727 m)       Physical Exam Vitals reviewed.  Constitutional:      Appearance: He is well-developed.  HENT:     Head: Normocephalic and atraumatic.  Eyes:     Pupils: Pupils are equal, round, and reactive to light.  Neck:     Vascular: No carotid bruit or JVD.  Cardiovascular:     Rate and Rhythm: Normal rate and regular rhythm.     Heart sounds: Normal heart sounds. No murmur heard.   Pulmonary:     Effort: Pulmonary effort is normal.     Breath sounds: Normal breath sounds. No rales.  Skin:    General: Skin is warm and dry.  Neurological:     Mental Status: He is alert and oriented to person, place, and time.     Results for orders placed or performed in visit on 04/25/20  POCT glucose (manual entry)  Result Value Ref Range   POC Glucose 131 (A) 70 - 99 mg/dl  POCT glycosylated hemoglobin (Hb A1C)  Result Value Ref Range   Hemoglobin A1C 7.0 (A) 4.0 - 5.6 %   HbA1c POC (<> result, manual entry)     HbA1c, POC (prediabetic range)     HbA1c, POC (controlled diabetic range)     30 minutes spent during visit, greater than 50% counseling and assimilation of information, chart review, and discussion of plan.    Assessment  & Plan:  MIKA ANASTASI is a 69 y.o. male . Type 2 diabetes mellitus with diabetic polyneuropathy, without long-term current use of insulin (HCC) - Plan: POCT glucose (manual entry), POCT glycosylated hemoglobin (Hb A1C)  -Improved control, will continue same regimen, but if return of symptomatic hypoglycemia we will have him decrease glipizide by half.  Recheck 3 months  Elevated serum creatinine - Plan: Basic metabolic panel Essential hypertension - Plan: Basic metabolic panel  -Lower home readings, check BMP with recent creatinine slightly above baseline.  Continue same regimen for now.  Anticipate improved activity with knee replacement recovery.  RTC precautions  No orders of the defined types were placed in this encounter.  Patient Instructions    Good news.  Hemoglobin A1c has improved further at 7.0.  No medication changes for now, but if you do have return of low blood sugar symptoms, decrease glipizide to half pill.  Make sure to drink sufficient fluids and do not skip meals.  Let me know if there are questions.  Recheck 3 months.   If you have lab work done today you will be contacted with your lab results within the next 2 weeks.  If you have not heard from [place] then please contact us. The fastest way to get your results is to register  for My Chart.   IF you received an x-ray today, you will receive an invoice from [facility]. Please contact [facility] at [phone] with questions or concerns regarding your invoice.   IF you received labwork today, you will receive an invoice from [employer]. Please contact [facility] at [phone] with questions or concerns regarding your invoice.   Our billing staff will not be able to assist you with questions regarding bills from these companies.  You will be contacted with the lab results as soon as they are available. The fastest way to get your results is to activate your My Chart account. Instructions are located on the  last page of this paperwork. If you have not heard from [place] regarding the results in 2 weeks, please contact this office.         Signed, Merri Ray, MD [facility]

## 2020-04-25 NOTE — Patient Instructions (Addendum)
  Good news.  Hemoglobin A1c has improved further at 7.0.  No medication changes for now, but if you do have return of low blood sugar symptoms, decrease glipizide to half pill.  Make sure to drink sufficient fluids and do not skip meals.  Let me know if there are questions.  Recheck 3 months.   If you have lab work done today you will be contacted with your lab results within the next 2 weeks.  If you have not heard from [place] then please contact us. The fastest way to get your results is to register for My Chart.   IF you received an x-ray today, you will receive an invoice from [facility]iology. Please contact [facility] Radiology at [phone] with questions or concerns regarding your invoice.   IF you received labwork today, you will receive an invoice from [place]. Please contact [facility] at [phone] with questions or concerns regarding your invoice.   Our billing staff will not be able to assist you with questions regarding bills from these companies.  You will be contacted with the lab results as soon as they are available. The fastest way to get your results is to activate your My Chart account. Instructions are located on the last page of this paperwork. If you have not heard from [place] regarding the results in 2 weeks, please contact this office.

## 2020-04-26 ENCOUNTER — Encounter: Payer: Self-pay | Admitting: Physical Therapy

## 2020-04-26 ENCOUNTER — Ambulatory Visit: Payer: Medicare Other | Admitting: Physical Therapy

## 2020-04-26 DIAGNOSIS — R2689 Other abnormalities of gait and mobility: Secondary | ICD-10-CM

## 2020-04-26 DIAGNOSIS — M25562 Pain in left knee: Secondary | ICD-10-CM | POA: Diagnosis not present

## 2020-04-26 DIAGNOSIS — M6281 Muscle weakness (generalized): Secondary | ICD-10-CM | POA: Diagnosis not present

## 2020-04-26 DIAGNOSIS — M25662 Stiffness of left knee, not elsewhere classified: Secondary | ICD-10-CM

## 2020-04-26 LAB — BASIC METABOLIC PANEL
BUN/Creatinine Ratio: 17 (ref 10–24)
BUN: 16 mg/dL (ref 8–27)
CO2: 24 mmol/L (ref 20–29)
Calcium: 9.3 mg/dL (ref 8.6–10.2)
Chloride: 99 mmol/L (ref 96–106)
Creatinine, Ser: 0.95 mg/dL (ref 0.76–1.27)
GFR calc Af Amer: 93 mL/min/{1.73_m2} (ref >59)
GFR calc non Af Amer: 80 mL/min/{1.73_m2} (ref >59)
Glucose: 117 mg/dL — ABNORMAL HIGH (ref 65–99)
Potassium: 5.4 mmol/L — ABNORMAL HIGH (ref 3.5–5.2)
Sodium: 139 mmol/L (ref 134–144)

## 2020-04-26 NOTE — Therapy (Signed)
[facility] [address] [place], [place], [address] Phone: [phone]   Fax:  [phone]  Physical Therapy Treatment  Patient Details  Name: Daniel [name] MRN: [id number] Date of Birth: Dec 06, 1950 Referring Provider (PT): Ninfa Linden, MD   Encounter Date: 04/26/2020   PT End of Session - 04/26/20 1439    Visit Number 5    Number of Visits 12    Date for PT Re-Evaluation 05/17/20    Authorization Type UHC MCR    PT Start Time 4010    PT Stop Time 1425    PT Time Calculation (min) 39 min    Activity Tolerance Patient tolerated treatment well    Behavior During Therapy [facility] for tasks assessed/performed           Past Medical History:  Diagnosis Date  . Allergic rhinitis   . Allergy   . Anxiety   . Arthritis   . Benign hypertension   . Cataract   . Depression   . Diabetes mellitus ([place])    type 2   . GERD (gastroesophageal reflux disease)   . History of pneumonia   . HLD (hyperlipidemia)   . Neuromuscular disorder ([place])    pt denies at 8/19/visit   . Pneumonia    hx of several times per pt - last episode 4-5 years ago     Past Surgical History:  Procedure Laterality Date  . CARPAL TUNNEL RELEASE Bilateral 2020 Right, 2021 left  . CATARACT EXTRACTION     bilateral  . EYE SURGERY    . KNEE SURGERY     right  . 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   Procedure: LEFT TOTAL KNEE ARTHROPLASTY;  Surgeon: Mcarthur Rossetti, MD;  Location: [facility];  Service: Orthopedics;  Laterality: Left;    There were no vitals filed for this visit.   Subjective Assessment - 04/26/20 1406    Subjective he says he was checked out by cardiologist and everything looks good. He relays only about 2 out of 10 knee pain    Pertinent History Lt TKA 02/26/20    Limitations Lifting;Standing;Walking;House hold activities    How long can you stand comfortably? was able to stand 25 min    Patient Stated Goals get more ROM, play golf again    Pain  Onset More than a month ago            [facility] Adult PT Treatment/Exercise - 04/26/20 0001      Knee/Hip Exercises: Stretches   Knee: Self-Stretch Limitations lunge stretch with Lt foot in chair 10 sec X 10 reps. Seated tailgate stretch with self O.P contract-relax 10 sec X 10 reps    Gastroc Stretch Both;3 reps;30 seconds      Knee/Hip Exercises: Aerobic   Recumbent Bike seat 9, L2 for  8 min      Knee/Hip Exercises: Machines for Strengthening   Cybex Knee Extension 5 lbs Lt 3 sets of 10    Cybex Knee Flexion 25 lbs bilat pull 3 sets of 10    Total Gym Leg Press bilat push 87 lbs 2X10 reps with slow eccentric lower, then dropped to 50 lbs Lt leg only 2X10 reps      Knee/Hip Exercises: Standing   Lateral Step Up Left;10 reps;Hand Hold: 1;Step Height: 6"    Forward Step Up Left;10 reps;Hand Hold: 1;Step Height: 6"    Step Down Left;10 reps;Hand Hold: 1;Step Height: 6"      Manual Therapy   Manual  therapy comments Lt knee PROM for flexion and extension, Mobs for flexion and extension                       PT Long Term Goals - 04/22/20 1104      PT LONG TERM GOAL #1   Title Pt will be I and compliant with HEP.    Time 6    Period Weeks    Status On-going      PT LONG TERM GOAL #2   Title Pt will improve Lt knee ROM 3-115 deg to improve function.    Time 6    Period Weeks    Status On-going      PT LONG TERM GOAL #3   Title Pt will improve Lt knee strength to 5/5 MMT to improve function.    Time 6    Period Weeks    Status On-going      PT LONG TERM GOAL #4   Title Pt will be able to ambulate community distances no AD    Time 6    Period Weeks    Status On-going      PT LONG TERM GOAL #5   Title Pt will be able to return to light golfing activity.    Time 6    Period Weeks    Status On-going                 Plan - 04/26/20 1440    Clinical Impression Statement Continues to do well with ROM progression. After he was cleared by cardiologist  added back in strengthening today with good tolerance and without signs of distress other than mild SHOB so provided rest breaks as needed.    Personal Factors and Comorbidities Past/Current Experience    Examination-Activity Limitations Bend;Squat;Stairs;Lift;Stand;Locomotion Level    Examination-Participation Restrictions Church;Cleaning;Community Activity;Driving;Shop;Laundry;Yard Work    Stability/Clinical Decision Making Stable/Uncomplicated    Rehab Potential Good    PT Frequency 2x / week    PT Duration 6 weeks    PT Treatment/Interventions ADLs/Self Care Home Management;Electrical Stimulation;Cryotherapy;Iontophoresis 4mg /ml Dexamethasone;Moist Heat;Ultrasound;Gait training;Stair training;Therapeutic activities;Therapeutic exercise;Balance training;Neuromuscular re-education;Manual techniques;Passive range of motion;Dry needling;Joint Manipulations;Vasopneumatic Device;Taping    PT Next Visit Plan needs Lt knee ROM, and strength    PT Home Exercise Plan reviewed his current HEP (quad sets, heel prop,heelslides, tailgate flexion, then added knee lunge stretch with foot on step, and squats at counter    Consulted and Agree with Plan of Care Patient           Patient will benefit from skilled therapeutic intervention in order to improve the following deficits and impairments:  Abnormal gait, Decreased activity tolerance, Decreased balance, Decreased mobility, Decreased endurance, Decreased range of motion, Decreased scar mobility, Decreased strength, Increased edema, Hypomobility, Difficulty walking, Increased fascial restricitons, Increased muscle spasms, Impaired flexibility, Postural dysfunction, Pain  Visit Diagnosis: Acute pain of left knee  Stiffness of left knee, not elsewhere classified  Muscle weakness (generalized)  Other abnormalities of gait and mobility     Problem List Patient Active Problem List   Diagnosis Date Noted  . Status post total left knee replacement  02/26/2020  . Trigger finger, left little finger 02/04/2020  . Carpal tunnel syndrome, left upper limb 11/24/2019  . Unilateral primary osteoarthritis, left knee 11/02/2019  . Unilateral primary osteoarthritis, right knee 11/02/2019  . Carpal tunnel syndrome, right upper limb 02/18/2018  . Anxiety and depression 10/28/2016  . Gastroesophageal reflux disease without esophagitis 10/28/2016  .  Seasonal allergic rhinitis due to pollen 10/28/2016  . Primary osteoarthritis of left knee 07/25/2015  . Essential hypertension 07/25/2015  . Hypercholesteremia 07/25/2015  . Type 2 diabetes mellitus with diabetic polyneuropathy, without long-term current use of insulin ([place]) 07/25/2015  . Pollen allergies 02/10/2014  . Allergic rhinitis due to animal (cat) (dog) hair and dander 09/15/2012  . Extrinsic asthma 09/13/2011  . Proteinuria 11/24/2010    Silvestre Mesi 04/26/2020, 2:41 PM  [facility] [address] [place], [place], [address] Phone: [phone]   Fax:  [phone]  Name: Daniel [name] MRN: [id number] Date of Birth: [birth year]

## 2020-04-28 ENCOUNTER — Encounter: Payer: Medicare Other | Admitting: Rehabilitative and Restorative Service Providers"

## 2020-04-28 DIAGNOSIS — Z85828 Personal history of other malignant neoplasm of skin: Secondary | ICD-10-CM | POA: Diagnosis not present

## 2020-04-28 DIAGNOSIS — L720 Epidermal cyst: Secondary | ICD-10-CM | POA: Diagnosis not present

## 2020-04-28 DIAGNOSIS — D225 Melanocytic nevi of trunk: Secondary | ICD-10-CM | POA: Diagnosis not present

## 2020-04-28 DIAGNOSIS — D235 Other benign neoplasm of skin of trunk: Secondary | ICD-10-CM | POA: Diagnosis not present

## 2020-04-28 DIAGNOSIS — L821 Other seborrheic keratosis: Secondary | ICD-10-CM | POA: Diagnosis not present

## 2020-05-03 ENCOUNTER — Ambulatory Visit: Payer: Medicare Other | Admitting: Physical Therapy

## 2020-05-03 ENCOUNTER — Other Ambulatory Visit: Payer: Self-pay

## 2020-05-03 VITALS — BP 157/82 | HR 70

## 2020-05-03 DIAGNOSIS — M25562 Pain in left knee: Secondary | ICD-10-CM

## 2020-05-03 DIAGNOSIS — M6281 Muscle weakness (generalized): Secondary | ICD-10-CM

## 2020-05-03 DIAGNOSIS — R2689 Other abnormalities of gait and mobility: Secondary | ICD-10-CM | POA: Diagnosis not present

## 2020-05-03 DIAGNOSIS — M25662 Stiffness of left knee, not elsewhere classified: Secondary | ICD-10-CM | POA: Diagnosis not present

## 2020-05-03 NOTE — Therapy (Addendum)
[facility] Physical Therapy [address] [place], [place], [address] Phone: [phone]   Fax:  [phone]  Physical Therapy Treatment/Progress note/Discharge addendum Progress Note reporting period date 04/05/20 to 05/03/20  PHYSICAL THERAPY DISCHARGE SUMMARY  Visits from Start of Care: 6    Education / Equipment: HEP Plan:                                                    Patient goals were partially met. Patient is being discharged due to not returning since the last visit.  ?????    Elsie Ra, PT, DPT 06/20/20 3:50 PM    See below for objective and subjective measurements relating to patients progress with PT.   Patient Details  Name: Daniel [name] MRN: [id number] Date of Birth: 1951/03/19 Referring Provider (PT): Ninfa Linden, MD   Encounter Date: 05/03/2020   PT End of Session - 05/03/20 0948    Visit Number 6    Number of Visits 12    Date for PT Re-Evaluation 05/17/20    Authorization Type [employer] MCR    Progress Note Due on Visit 16    PT Start Time 0931    PT Stop Time 1012    PT Time Calculation (min) 41 min    Activity Tolerance Patient tolerated treatment well    Behavior During Therapy [facility] for tasks assessed/performed           Past Medical History:  Diagnosis Date  . Allergic rhinitis   . Allergy   . Anxiety   . Arthritis   . Benign hypertension   . Cataract   . Depression   . Diabetes mellitus ([place])    type 2   . GERD (gastroesophageal reflux disease)   . History of pneumonia   . HLD (hyperlipidemia)   . Neuromuscular disorder ([place])    pt denies at 8/19/visit   . Pneumonia    hx of several times per pt - last episode 4-5 years ago     Past Surgical History:  Procedure Laterality Date  . CARPAL TUNNEL RELEASE Bilateral 2020 Right, 2021 left  . CATARACT EXTRACTION     bilateral  . EYE SURGERY    . KNEE SURGERY     right  . 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   Procedure: LEFT TOTAL KNEE ARTHROPLASTY;   Surgeon: Mcarthur Rossetti, MD;  Location: [facility];  Service: Orthopedics;  Laterality: Left;    Vitals:   05/03/20 0937  BP: (!) 157/82  Pulse: 70     Subjective Assessment - 05/03/20 0946    Subjective relays his knee feels good but his BP has been really high so he wants to limit what we do in PT today    Pertinent History Lt TKA 02/26/20    Limitations Lifting;Standing;Walking;House hold activities    How long can you stand comfortably? was able to stand 25 min    Patient Stated Goals get more ROM, play golf again    Pain Onset More than a month ago              [facility] PT Assessment - 05/03/20 0001      Assessment   Medical Diagnosis Lt TKA    Referring Provider (PT) Ninfa Linden, MD    Onset Date/Surgical Date 02/20/20    Next MD Visit 05/05/20  AROM   Left Knee Extension 2    Left Knee Flexion 110      PROM   Left Knee Extension 0    Left Knee Flexion 120      Strength   Overall Strength Comments Lt hip/knee strength overall 5-             [facility] Adult PT Treatment/Exercise - 05/03/20 0001      Knee/Hip Exercises: Stretches   Active Hamstring Stretch Left;3 reps;30 seconds    Quad Stretch Left;3 reps;30 seconds    Knee: Self-Stretch Limitations seated tailgate flexion stretching 3 min    Other Knee/Hip Stretches supine heelslides AAROM with strap 10 sec X 10      Knee/Hip Exercises: Standing   Knee Flexion Left;3 sets;10 reps    Knee Flexion Limitations 5 lbs      Knee/Hip Exercises: Seated   Long Arc Quad Left;3 sets;15 reps    Long Arc Quad Weight 5 lbs.    Long Arc [employer] Limitations holding 3 sec      Knee/Hip Exercises: Supine   Short Arc [employer] Left;2 sets;10 reps    Short Arc [employer] Limitations 5 lbs holding 3 sec    Straight Leg Raises Left;15 reps    Straight Leg Raises Limitations 2 lbs, holding 2 sec      Manual Therapy   Manual therapy comments Lt knee PROM for flexion and extension, Mobs for flexion and extension                        PT Long Term Goals - 04/22/20 1104      PT LONG TERM GOAL #1   Title Pt will be I and compliant with HEP.    Time 6    Period Weeks    Status On-going      PT LONG TERM GOAL #2   Title Pt will improve Lt knee ROM 3-115 deg to improve function.    Time 6    Period Weeks    Status On-going      PT LONG TERM GOAL #3   Title Pt will improve Lt knee strength to 5/5 MMT to improve function.    Time 6    Period Weeks    Status On-going      PT LONG TERM GOAL #4   Title Pt will be able to ambulate community distances no AD    Time 6    Period Weeks    Status On-going      PT LONG TERM GOAL #5   Title Pt will be able to return to light golfing activity.    Time 6    Period Weeks    Status On-going                 Plan - 05/03/20 1022    Clinical Impression Statement Held off on strength focus and standing activity due to high BP today and instead focused more on seated/supine open chain exercises and overall stretching/ROM. BP did level out some to 136/82 at end of session where it was 157/82 at beginning of session. He has made excellent progress in ROM and good progress with overall strength. He is doing very well post op.    Personal Factors and Comorbidities Past/Current Experience    Examination-Activity Limitations Bend;Squat;Stairs;Lift;Stand;Locomotion Level    Examination-Participation Restrictions Church;Cleaning;Community Activity;Driving;Shop;Laundry;Yard Work    Stability/Clinical Decision Making Stable/Uncomplicated    Rehab Potential Good  PT Frequency 2x / week    PT Duration 6 weeks    PT Treatment/Interventions ADLs/Self Care Home Management;Electrical Stimulation;Cryotherapy;Iontophoresis 8m/ml Dexamethasone;Moist Heat;Ultrasound;Gait training;Stair training;Therapeutic activities;Therapeutic exercise;Balance training;Neuromuscular re-education;Manual techniques;Passive range of motion;Dry needling;Joint  Manipulations;Vasopneumatic Device;Taping    PT Next Visit Plan needs Lt knee ROM, and strength    PT Home Exercise Plan reviewed his current HEP (quad sets, heel prop,heelslides, tailgate flexion, then added knee lunge stretch with foot on step, and squats at counter    Consulted and Agree with Plan of Care Patient           Patient will benefit from skilled therapeutic intervention in order to improve the following deficits and impairments:  Abnormal gait, Decreased activity tolerance, Decreased balance, Decreased mobility, Decreased endurance, Decreased range of motion, Decreased scar mobility, Decreased strength, Increased edema, Hypomobility, Difficulty walking, Increased fascial restricitons, Increased muscle spasms, Impaired flexibility, Postural dysfunction, Pain  Visit Diagnosis: Acute pain of left knee  Stiffness of left knee, not elsewhere classified  Muscle weakness (generalized)  Other abnormalities of gait and mobility     Problem List Patient Active Problem List   Diagnosis Date Noted  . 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  . Trigger finger, left little finger 02/04/2020  . Carpal tunnel syndrome, left upper limb 11/24/2019  . Unilateral primary osteoarthritis, left knee 11/02/2019  . Unilateral primary osteoarthritis, right knee 11/02/2019  . Carpal tunnel syndrome, right upper limb 02/18/2018  . Anxiety and depression 10/28/2016  . Gastroesophageal reflux disease without esophagitis 10/28/2016  . Seasonal allergic rhinitis due to pollen 10/28/2016  . Primary osteoarthritis of left knee 07/25/2015  . Essential hypertension 07/25/2015  . Hypercholesteremia 07/25/2015  . Type 2 diabetes mellitus with diabetic polyneuropathy, without long-term current use of insulin ([place] 07/25/2015  . Pollen allergies 02/10/2014  . Allergic rhinitis due to animal (cat) (dog) hair and dander 09/15/2012  . Extrinsic asthma 09/13/2011  . Proteinuria 11/24/2010     BSilvestre Mesi11/08/2019, 10:27 AM  [facility] [address] [address]Phone: [phone]  Fax:  [phone] Name: Daniel REHMRN: [id number] of Birth: [birth year]

## 2020-05-04 ENCOUNTER — Telehealth: Payer: Self-pay

## 2020-05-04 NOTE — Telephone Encounter (Signed)
lvm for pt to call back to r/s appointment for tomorrow with Artis Delay

## 2020-05-05 ENCOUNTER — Ambulatory Visit: Payer: Medicare Other | Admitting: Physician Assistant

## 2020-05-05 ENCOUNTER — Encounter: Payer: Medicare Other | Admitting: Rehabilitative and Restorative Service Providers"

## 2020-05-09 DIAGNOSIS — E119 Type 2 diabetes mellitus without complications: Secondary | ICD-10-CM | POA: Diagnosis not present

## 2020-05-09 LAB — HM DIABETES EYE EXAM

## 2020-05-10 ENCOUNTER — Other Ambulatory Visit: Payer: Self-pay | Admitting: Registered Nurse

## 2020-05-10 ENCOUNTER — Encounter: Payer: Medicare Other | Admitting: Physical Therapy

## 2020-05-10 DIAGNOSIS — G8929 Other chronic pain: Secondary | ICD-10-CM

## 2020-05-10 DIAGNOSIS — M25562 Pain in left knee: Secondary | ICD-10-CM

## 2020-05-10 NOTE — Telephone Encounter (Signed)
Patient is requesting a refill of the following medications: Requested Prescriptions   Pending Prescriptions Disp Refills   traMADol (ULTRAM) 50 MG tablet [Pharmacy Med Name: traMADol HCl 50 MG Oral Tablet] 120 tablet 0    Sig: TAKE 1 TABLET BY MOUTH EVERY 6 HOURS AS NEEDED    Date of patient request: 05/10/2020 Last office visit: 04/25/2020 Date of last refill: 03/31/2020 Last refill amount: 120 tab Follow up time period per chart: 3 mon

## 2020-05-10 NOTE — Telephone Encounter (Signed)
Requested medication (s) are due for refill today: yes  Requested medication (s) are on the active medication list:yes  Last refill:  03/31/20  Future visit scheduled: yes  Notes to clinic:  not delegated    Requested Prescriptions  Pending Prescriptions Disp Refills   traMADol (ULTRAM) 50 MG tablet [Pharmacy Med Name: traMADol HCl 50 MG Oral Tablet] 120 tablet 0    Sig: TAKE 1 TABLET BY MOUTH EVERY 6 HOURS AS NEEDED      Not Delegated - Analgesics:  Opioid Agonists Failed - 05/10/2020  9:31 AM      Failed - This refill cannot be delegated      Failed - Urine Drug Screen completed in last 360 days      Passed - Valid encounter within last 6 months    Recent Outpatient Visits           2 weeks ago Type 2 diabetes mellitus with diabetic polyneuropathy, without long-term current use of insulin ([place])   [facility], Ranell Patrick, MD   1 month ago Lower respiratory infection   [facility], Delfino Lovett, NP   3 months ago Medicare annual wellness visit, subsequent   [facility], Ranell Patrick, MD   8 months ago Type 2 diabetes mellitus with diabetic polyneuropathy, without long-term current use of insulin [facility])   [facility], Ranell Patrick, MD   11 months ago Type 2 diabetes mellitus with diabetic polyneuropathy, without long-term current use of insulin [facility])   [facility], Ranell Patrick, MD       Future Appointments             Tomorrow Pete Pelt, PA-C [place]   In 2 months Wendie Agreste, MD [facility] at [place], [facility]

## 2020-05-11 ENCOUNTER — Ambulatory Visit: Payer: Medicare Other | Admitting: Physician Assistant

## 2020-05-12 ENCOUNTER — Encounter: Payer: Medicare Other | Admitting: Physical Therapy

## 2020-05-16 ENCOUNTER — Ambulatory Visit (INDEPENDENT_AMBULATORY_CARE_PROVIDER_SITE_OTHER): Payer: Medicare Other | Admitting: Physician Assistant

## 2020-05-16 ENCOUNTER — Encounter: Payer: Self-pay | Admitting: Physician Assistant

## 2020-05-16 DIAGNOSIS — Z96652 Presence of left artificial knee joint: Secondary | ICD-10-CM

## 2020-05-16 NOTE — Progress Notes (Signed)
HPI: Daniel [name] returns today status post left total knee arthroplasty 02/26/2020.  He states he is doing well.  He has no complaints no concerns.  Continues to do his home exercise program.  Physical exam: Left knee he has full extension flexion to approximately 115 degrees soft tissue box him from flexing any further.  Left calf supple nontender.  Incisions well-healed no signs of infection.  No gross instability valgus varus stressing left knee.  Impression: Status post left total knee arthroplasty 02/26/2020  Plan: He will continue work on strengthening left knee as shown by physical therapy.  He will follow-up with [place] in August 2022 and at that time we will obtain an AP and lateral view of the left knee.  He can always follow-up with [place] sooner if there is any questions concerns.  Continue to work on scar tissue mobilization.  Questions encouraged and answered.

## 2020-05-21 ENCOUNTER — Encounter: Payer: Self-pay | Admitting: Family Medicine

## 2020-05-30 ENCOUNTER — Encounter: Payer: Self-pay | Admitting: Registered Nurse

## 2020-05-30 ENCOUNTER — Ambulatory Visit (INDEPENDENT_AMBULATORY_CARE_PROVIDER_SITE_OTHER): Payer: Medicare Other | Admitting: Registered Nurse

## 2020-05-30 ENCOUNTER — Other Ambulatory Visit: Payer: Self-pay

## 2020-05-30 VITALS — BP 168/77 | HR 90 | Temp 97.7°F | Resp 18 | Ht 68.0 in | Wt 239.8 lb

## 2020-05-30 DIAGNOSIS — R609 Edema, unspecified: Secondary | ICD-10-CM | POA: Diagnosis not present

## 2020-05-30 DIAGNOSIS — R062 Wheezing: Secondary | ICD-10-CM | POA: Diagnosis not present

## 2020-05-30 DIAGNOSIS — I10 Essential (primary) hypertension: Secondary | ICD-10-CM | POA: Diagnosis not present

## 2020-05-30 MED ORDER — FUROSEMIDE 20 MG PO TABS: 20.0000 mg | ORAL_TABLET | Freq: Every day | ORAL | 0 refills | Status: DC

## 2020-05-30 MED ORDER — MONTELUKAST SODIUM 10 MG PO TABS: 10.0000 mg | ORAL_TABLET | Freq: Every day | ORAL | 1 refills | Status: DC

## 2020-05-30 NOTE — Patient Instructions (Signed)
° ° ° °  If you have lab work done today you will be contacted with your lab results within the next 2 weeks.  If you have not heard from us then please contact us. The fastest way to get your results is to register for My Chart. ° ° °IF you received an x-ray today, you will receive an invoice from [facility]. Please contact  Radiology at [phone] with questions or concerns regarding your invoice.  ° °IF you received labwork today, you will receive an invoice from [facility]. Please contact [facility] at [phone] with questions or concerns regarding your invoice.  ° °Our billing staff will not be able to assist you with questions regarding bills from these companies. ° °You will be contacted with the lab results as soon as they are available. The fastest way to get your results is to activate your My Chart account. Instructions are located on the last page of this paperwork. If you have not heard from us regarding the results in 2 weeks, please contact this office. °  ° ° ° °

## 2020-05-30 NOTE — Progress Notes (Signed)
Acute Office Visit  Subjective:    Patient ID: Daniel [name], male    DOB: 02/26/1951, 69 y.o.   MRN: [id number]  Chief Complaint  Patient presents with   Foot Swelling    Patient states that starting two weeks ago he has been having some swelling in both feet and legs. Patient states he didn't take any medications, increased water and watched his sodium intake.   Hypertension    Patient states he would like to discuss his BP.    HPI Patient is in today for bilateral dependent edema  Onset 2-3 weeks ago. Mostly stable. Worse at the end of the day and when he is more active. Improved with decreased salt intake and elevation of feet.  No cardiac symptoms: denies chest pain, palpitations, irregular heartbeat, doe No pulmonary symptoms: denies doe, shob, coughing, orthopnea  Does have hx significant for t2dm with polyneuropathy - while swelling is uncomfortable does not think it is related to neuropathies. Did scratch his leg at one point - states clear fluid flowed from wound, now a scab is there - expected delayed healing with t2dm States that he has had some hyperpigmentation in lower legs for a few years, seems to be stable  Had EKG in February 18, 2020- showed no change from previous ekg from 09/13/2016. 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 Dr. Gwenlyn Found in cardiology determined this to be acceptable for TKA.   Past Medical History:  Diagnosis Date   Allergic rhinitis    Allergy    Anxiety    Arthritis    Benign hypertension    Cataract    Depression    Diabetes mellitus ([place])    type 2    GERD (gastroesophageal reflux disease)    History of pneumonia    HLD (hyperlipidemia)    Neuromuscular disorder ([place])    pt denies at 8/19/visit    Pneumonia    hx of several times per pt - last episode 4-5 years ago     Past Surgical History:  Procedure Laterality Date   CARPAL TUNNEL RELEASE Bilateral 2020 Right, 2021 left   CATARACT  EXTRACTION     bilateral   EYE SURGERY     KNEE SURGERY     right   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   Procedure: LEFT TOTAL KNEE ARTHROPLASTY;  Surgeon: Mcarthur Rossetti, MD;  Location: [facility];  Service: Orthopedics;  Laterality: Left;    Family History  Problem Relation Age of Onset   Diabetes Father    Colon polyps Father    Atrial fibrillation Mother    Prostate cancer Paternal Uncle    Cancer Sister    Esophageal cancer Neg Hx    Rectal cancer Neg Hx    Stomach cancer Neg Hx     Social History   Socioeconomic History   Marital status: Married    Spouse name: Not on file   Number of children: 3   Years of education: 16   Highest education level: Not on file  Occupational History   Occupation: Dealer, Company secretary  Tobacco Use   Smoking status: Never Smoker   Smokeless tobacco: Never Used  Scientific laboratory technician Use: Never used  Substance and Sexual Activity   Alcohol use: No   Drug use: No   Sexual activity: Not Currently  Other Topics Concern   Not on file  Social History Narrative   Not on file   Social Determinants  of Health   Financial Resource Strain:    Difficulty of Paying Living Expenses: Not on file  Food Insecurity:    Worried About [place] in the Last Year: Not on file   Ran Out of Food in the Last Year: Not on file  Transportation Needs:    Lack of Transportation (Medical): Not on file   Lack of Transportation (Non-Medical): Not on file  Physical Activity:    Days of Exercise per Week: Not on file   Minutes of Exercise per Session: Not on file  Stress:    Feeling of Stress : Not on file  Social Connections:    Frequency of Communication with Friends and Family: Not on file   Frequency of Social Gatherings with Friends and Family: Not on file   Attends Religious Services: Not on file   Active Member of [place] or Organizations: Not on file   Attends Archivist  Meetings: Not on file   Marital Status: Not on file  Intimate Partner Violence:    Fear of Current or Ex-Partner: Not on file   Emotionally Abused: Not on file   Physically Abused: Not on file   Sexually Abused: Not on file    Outpatient Medications Prior to Visit  Medication Sig Dispense Refill   aspirin EC 325 MG EC tablet Take 1 tablet (325 mg total) by mouth 2 (two) times daily after a meal. 30 tablet 0   azithromycin (ZITHROMAX) 250 MG tablet Take 2 tabs on first day. Then take 1 tab daily. Finish entire supply. 6 tablet 0   blood glucose meter kit and supplies 1 each by Other route as directed. Dispense based on patient and insurance preference. Check once per day (variable times).  Accu check. 1 each 0   cetirizine (ZYRTEC) 10 MG tablet Take 10 mg by mouth daily as needed for allergies.      Cholecalciferol (VITAMIN D) 125 MCG (5000 UT) CAPS Take 5,000 Units by mouth daily.     Cyanocobalamin (VITAMIN B 12 PO) Take 1,000 mg by mouth daily.      diclofenac Sodium (VOLTAREN) 1 % GEL Apply 2 g topically daily.     fluticasone (FLONASE) 50 MCG/ACT nasal spray Place 1 spray into both nostrils daily.     gabapentin (NEURONTIN) 300 MG capsule Take 1-2 capsules (300-600 mg total) by mouth 2 (two) times daily as needed. (Patient taking differently: Take 300 mg by mouth 2 (two) times daily. ) 180 capsule 3   glimepiride (AMARYL) 4 MG tablet Take 1 tablet (4 mg total) by mouth 2 (two) times daily. (Patient taking differently: Take 4 mg by mouth daily with breakfast. ) 180 tablet 0   guaiFENesin-dextromethorphan (ROBITUSSIN DM) 100-10 MG/5ML syrup Take 5 mLs by mouth [name] 4 (four) hours as needed for cough. 236 mL 0   ibuprofen (ADVIL,MOTRIN) 200 MG tablet Take 400 mg by mouth in the morning and at bedtime.      Lancets (ONETOUCH DELICA PLUS EQASTM19Q) MISC USE 1 TO CHECK GLUCOSE ONCE DAILY     liraglutide (VICTOZA) 18 MG/3ML SOPN Inject 0.3 mLs (1.8 mg total) into the skin  daily. I 3 mL 5   lisinopril (ZESTRIL) 20 MG tablet Take 1 tablet (20 mg total) by mouth daily. 90 tablet 3   metFORMIN (GLUCOPHAGE) 1000 MG tablet TAKE 1 TABLET BY MOUTH TWICE DAILY WITH A MEAL 180 tablet 0   methocarbamol (ROBAXIN) 500 MG tablet Take 1 tablet (500 mg total)  by mouth [name] 6 (six) hours as needed for muscle spasms. 60 tablet 1   Multiple Vitamin (MULTIVITAMIN WITH MINERALS) TABS tablet Take 1 tablet by mouth daily.     omeprazole (PRILOSEC) 20 MG capsule Take 1 capsule (20 mg total) by mouth daily. 90 capsule 3   ondansetron (ZOFRAN ODT) 4 MG disintegrating tablet Take 1 tablet (4 mg total) by mouth [name] 8 (eight) hours as needed for nausea or vomiting. 20 tablet 0   ONETOUCH ULTRA test strip USE 1 STRIP TO CHECK GLUCOSE ONCE DAILY 100 each 1   oxyCODONE (OXY IR/ROXICODONE) 5 MG immediate release tablet Take 1-2 tablets (5-10 mg total) by mouth [name] 6 (six) hours as needed for moderate pain (pain score 4-6). 30 tablet 0   pravastatin (PRAVACHOL) 80 MG tablet Take 1 tablet (80 mg total) by mouth daily. 90 tablet 1   sertraline (ZOLOFT) 100 MG tablet Take 2 tablets (200 mg total) by mouth daily. Start with one per day for initial 2 weeks, then increase to 2 per day. (Patient taking differently: Take 100-200 mg by mouth daily. ) 180 tablet 1   sodium chloride (OCEAN) 0.65 % SOLN nasal spray Place 1 spray into both nostrils as needed for congestion.     traMADol (ULTRAM) 50 MG tablet TAKE 1 TABLET BY MOUTH [name] 6 HOURS AS NEEDED 120 tablet 0   vitamin E 180 MG (400 UNITS) capsule Take 400 Units by mouth daily.     montelukast (SINGULAIR) 10 MG tablet Take 1 tablet (10 mg total) by mouth at bedtime. 90 tablet 1   No facility-administered medications prior to visit.    Allergies  Allergen Reactions   Actos [Pioglitazone] Other (See Comments), Shortness Of Breath and Swelling    Blood sugar went up and down and sweating profusely   Codeine Shortness Of Breath and  Palpitations    Review of Systems Per hpi      Objective:    Physical Exam Vitals and nursing note reviewed.  Constitutional:      General: He is not in acute distress.    Appearance: Normal appearance. He is obese. He is not ill-appearing, toxic-appearing or diaphoretic.  Cardiovascular:     Rate and Rhythm: Normal rate and regular rhythm.     Pulses: Normal pulses.     Heart sounds: Normal heart sounds. No murmur heard.  No friction rub. No gallop.   Pulmonary:     Effort: Pulmonary effort is normal. No respiratory distress.     Breath sounds: Normal breath sounds. No stridor. No wheezing, rhonchi or rales.  Chest:     Chest wall: No tenderness.  Musculoskeletal:        General: Normal range of motion.     Right lower leg: Edema (+1 pitting edema) present.     Left lower leg: Edema (+1 pitting edema) present.  Skin:    General: Skin is warm and dry.     Findings: Lesion (small wound medial R lower extremity. scabbed over. no evidence of infection.) present.     Comments: Mild hyperpigmentation bilat lower legs  Neurological:     General: No focal deficit present.     Mental Status: He is alert and oriented to person, place, and time. Mental status is at baseline.  Psychiatric:        Mood and Affect: Mood normal.        Behavior: Behavior normal.        Thought Content: Thought content normal.  Judgment: Judgment normal.     BP (!) 168/77    Pulse 90    Temp 97.7 F (36.5 C) (Temporal)    Resp 18    Ht '5\' 8"'  (1.727 m)    Wt 239 lb 12.8 oz (108.8 kg)    SpO2 96%    BMI 36.46 kg/m  Wt Readings from Last 3 Encounters:  05/30/20 239 lb 12.8 oz (108.8 kg)  04/25/20 231 lb (104.8 kg)  02/26/20 235 lb (106.6 kg)    There are no preventive care reminders to display for this patient.  There are no preventive care reminders to display for this patient.   Lab Results  Component Value Date   TSH 2.170 04/08/2017   Lab Results  Component Value Date   WBC 15.5  (H) 02/27/2020   HGB 8.3 (L) 02/27/2020   HCT 26.2 (L) 02/27/2020   MCV 96.3 02/27/2020   PLT 264 02/27/2020   Lab Results  Component Value Date   NA 139 04/25/2020   K 5.4 (H) 04/25/2020   CO2 24 04/25/2020   GLUCOSE 117 (H) 04/25/2020   BUN 16 04/25/2020   CREATININE 0.95 04/25/2020   BILITOT <0.2 01/25/2020   ALKPHOS 90 01/25/2020   AST 21 01/25/2020   ALT 21 01/25/2020   PROT 7.1 01/25/2020   ALBUMIN 4.8 (H) 01/25/2020   CALCIUM 9.3 04/25/2020   ANIONGAP 11 02/27/2020   Lab Results  Component Value Date   CHOL 196 01/25/2020   Lab Results  Component Value Date   HDL 58 01/25/2020   Lab Results  Component Value Date   LDLCALC 102 (H) 01/25/2020   Lab Results  Component Value Date   TRIG 214 (H) 01/25/2020   Lab Results  Component Value Date   CHOLHDL 3.4 01/25/2020   Lab Results  Component Value Date   HGBA1C 7.0 (A) 04/25/2020       Assessment & Plan:   Problem List Items Addressed This Visit      Cardiovascular and Mediastinum   Essential hypertension - Primary   Relevant Medications   furosemide (LASIX) 20 MG tablet    Other Visit Diagnoses    Wheezing       Relevant Medications   montelukast (SINGULAIR) 10 MG tablet   Dependent edema       Relevant Orders   Brain natriuretic peptide   CBC   Comprehensive metabolic panel       Meds ordered this encounter  Medications   montelukast (SINGULAIR) 10 MG tablet    Sig: Take 1 tablet (10 mg total) by mouth at bedtime.    Dispense:  90 tablet    Refill:  1   furosemide (LASIX) 20 MG tablet    Sig: Take 1 tablet (20 mg total) by mouth daily.    Dispense:  90 tablet    Refill:  0    Order Specific Question:   Supervising Provider    Answer:   Carlota Raspberry, JEFFREY R [[id number]]   PLAN  History and exam reassuring concerning CHF. Will draw cmp, cbc, and bnp for further reassurance  Suspect peripheral vascular disease as a result of htn and t2dm - will give lasix 25m PO qd - has had hx of  hyperkalemia, low risk for hypokalemia but will monitor with metabolic panels at follow ups with PCP for his t2dm  If labs are concerning or symptoms progress will consider referrals to cardiology and vascular  Continue at home remedies - watch salt  intake, elevated legs, compression stockings when possible, and continue to monitor bp  Patient encouraged to call clinic with any questions, comments, or concerns.   Maximiano Coss, NP

## 2020-05-31 LAB — COMPREHENSIVE METABOLIC PANEL
ALT: 12 IU/L (ref 0–44)
AST: 11 IU/L (ref 0–40)
Albumin/Globulin Ratio: 1.6 (ref 1.2–2.2)
Albumin: 4.5 g/dL (ref 3.7–4.7)
Alkaline Phosphatase: 108 IU/L (ref 44–121)
BUN/Creatinine Ratio: 16 (ref 10–24)
BUN: 23 mg/dL (ref 8–27)
Bilirubin Total: <0.2 mg/dL (ref 0.0–1.2)
CO2: 21 mmol/L (ref 20–29)
Calcium: 9.9 mg/dL (ref 8.6–10.2)
Chloride: 100 mmol/L (ref 96–106)
Creatinine, Ser: 1.4 mg/dL — ABNORMAL HIGH (ref 0.76–1.27)
GFR calc Af Amer: 58 mL/min/{1.73_m2} — ABNORMAL LOW (ref >59)
GFR calc non Af Amer: 50 mL/min/{1.73_m2} — ABNORMAL LOW (ref >59)
Globulin, Total: 2.8 g/dL (ref 1.5–4.5)
Glucose: 199 mg/dL — ABNORMAL HIGH (ref 65–99)
Potassium: 5 mmol/L (ref 3.5–5.2)
Sodium: 138 mmol/L (ref 134–144)
Total Protein: 7.3 g/dL (ref 6.0–8.5)

## 2020-05-31 LAB — CBC
Hematocrit: 35.3 % — ABNORMAL LOW (ref 37.5–51.0)
Hemoglobin: 11.5 g/dL — ABNORMAL LOW (ref 13.0–17.7)
MCH: 28.5 pg (ref 26.6–33.0)
MCHC: 32.6 g/dL (ref 31.5–35.7)
MCV: 87 fL (ref 79–97)
Platelets: 373 10*3/uL (ref 150–450)
RBC: 4.04 x10E6/uL — ABNORMAL LOW (ref 4.14–5.80)
RDW: 12.8 % (ref 11.6–15.4)
WBC: 8.6 10*3/uL (ref 3.4–10.8)

## 2020-05-31 LAB — BRAIN NATRIURETIC PEPTIDE: BNP: 21.9 pg/mL (ref 0.0–100.0)

## 2020-06-01 ENCOUNTER — Telehealth: Payer: Self-pay | Admitting: *Deleted

## 2020-06-01 NOTE — Telephone Encounter (Signed)
90 day Ortho bundle call attempted; no answer and left VM

## 2020-06-03 ENCOUNTER — Telehealth: Payer: Self-pay | Admitting: *Deleted

## 2020-06-03 NOTE — Telephone Encounter (Signed)
Ortho bundle 90 day call complete after attempting to reach patient x 2.

## 2020-06-10 ENCOUNTER — Other Ambulatory Visit: Payer: Self-pay | Admitting: Family Medicine

## 2020-06-10 ENCOUNTER — Other Ambulatory Visit: Payer: Self-pay | Admitting: Registered Nurse

## 2020-06-10 DIAGNOSIS — G8929 Other chronic pain: Secondary | ICD-10-CM

## 2020-06-10 DIAGNOSIS — M25562 Pain in left knee: Secondary | ICD-10-CM

## 2020-06-10 DIAGNOSIS — E1142 Type 2 diabetes mellitus with diabetic polyneuropathy: Secondary | ICD-10-CM

## 2020-06-10 NOTE — Telephone Encounter (Signed)
Controlled substance database (PDMP) reviewed. No concerns appreciated.  Last filled 05/10/20.  Discussed at appointment October 25th.

## 2020-06-10 NOTE — Telephone Encounter (Signed)
Requested medication (s) are due for refill today:   Provider to determine  Requested medication (s) are on the active medication list:   Yes  Future visit scheduled:   Yes   Last ordered: 05/10/2020 #120, 0 refills  Non delegated refill   Requested Prescriptions  Pending Prescriptions Disp Refills   traMADol (ULTRAM) 50 MG tablet [Pharmacy Med Name: traMADol HCl 50 MG Oral Tablet] 120 tablet 0    Sig: TAKE 1 TABLET BY MOUTH EVERY 6 HOURS AS NEEDED      Not Delegated - Analgesics:  Opioid Agonists Failed - 06/10/2020  1:18 PM      Failed - This refill cannot be delegated      Failed - Urine Drug Screen completed in last 360 days      Passed - Valid encounter within last 6 months    Recent Outpatient Visits           1 week ago Essential hypertension   [facility], [place], NP   1 month ago Type 2 diabetes mellitus with diabetic polyneuropathy, without long-term current use of insulin ([place])   [facility], Ranell Patrick, MD   2 months ago Lower respiratory infection   [facility], Delfino Lovett, NP   4 months ago Medicare annual wellness visit, subsequent   [facility], Ranell Patrick, MD   9 months ago Type 2 diabetes mellitus with diabetic polyneuropathy, without long-term current use of insulin [facility])   [facility], Ranell Patrick, MD       Future Appointments             In 1 month Carlota Raspberry Ranell Patrick, MD [facility] at [place], [facility]

## 2020-06-13 ENCOUNTER — Encounter: Payer: Self-pay | Admitting: Registered Nurse

## 2020-07-06 ENCOUNTER — Other Ambulatory Visit: Payer: Self-pay

## 2020-07-06 ENCOUNTER — Ambulatory Visit: Payer: Medicare Other

## 2020-07-06 DIAGNOSIS — E875 Hyperkalemia: Secondary | ICD-10-CM

## 2020-07-06 LAB — BASIC METABOLIC PANEL
BUN/Creatinine Ratio: 17 (ref 10–24)
BUN: 22 mg/dL (ref 8–27)
CO2: 23 mmol/L (ref 20–29)
Calcium: 9.6 mg/dL (ref 8.6–10.2)
Chloride: 98 mmol/L (ref 96–106)
Creatinine, Ser: 1.29 mg/dL — ABNORMAL HIGH (ref 0.76–1.27)
GFR calc Af Amer: 64 mL/min/{1.73_m2} (ref >59)
GFR calc non Af Amer: 55 mL/min/{1.73_m2} — ABNORMAL LOW (ref >59)
Glucose: 225 mg/dL — ABNORMAL HIGH (ref 65–99)
Potassium: 5.1 mmol/L (ref 3.5–5.2)
Sodium: 137 mmol/L (ref 134–144)

## 2020-07-11 ENCOUNTER — Other Ambulatory Visit: Payer: Self-pay | Admitting: Family Medicine

## 2020-07-11 DIAGNOSIS — M25562 Pain in left knee: Secondary | ICD-10-CM

## 2020-07-11 DIAGNOSIS — E1142 Type 2 diabetes mellitus with diabetic polyneuropathy: Secondary | ICD-10-CM

## 2020-07-11 DIAGNOSIS — G8929 Other chronic pain: Secondary | ICD-10-CM

## 2020-07-11 NOTE — Telephone Encounter (Signed)
Patient is requesting a refill of the following medications: Requested Prescriptions   Pending Prescriptions Disp Refills   traMADol (ULTRAM) 50 MG tablet [Pharmacy Med Name: traMADol HCl 50 MG Oral Tablet] 120 tablet 0    Sig: TAKE 1 TABLET BY MOUTH EVERY 6 HOURS AS NEEDED   Signed Prescriptions Disp Refills   metFORMIN (GLUCOPHAGE) 1000 MG tablet 180 tablet 0    Sig: TAKE 1 TABLET BY MOUTH TWICE DAILY WITH A MEAL    Authorizing Provider: Carlota Raspberry, JEFFREY R    Ordering User: HENDRICKS, CAYAREL L    Date of patient request: 07/11/20 Last office visit: 05/30/20 Date of last refill: 06/10/20 Last refill amount: 120 Follow up time period per chart: 07/27/20

## 2020-07-11 NOTE — Telephone Encounter (Signed)
Requested medication (s) are due for refill today: yes  Requested medication (s) are on the active medication list: yes  Last refill:  06/10/2020  Future visit scheduled: yes  Notes to clinic:  this refill cannot be delegated    Requested Prescriptions  Pending Prescriptions Disp Refills   traMADol (ULTRAM) 50 MG tablet [Pharmacy Med Name: traMADol HCl 50 MG Oral Tablet] 120 tablet 0    Sig: TAKE 1 TABLET BY MOUTH EVERY 6 HOURS AS NEEDED      Not Delegated - Analgesics:  Opioid Agonists Failed - 07/11/2020  1:25 PM      Failed - This refill cannot be delegated      Failed - Urine Drug Screen completed in last 360 days      Passed - Valid encounter within last 6 months    Recent Outpatient Visits           1 month ago Essential hypertension   [facility], [place], NP   2 months ago Type 2 diabetes mellitus with diabetic polyneuropathy, without long-term current use of insulin ([place])   [facility], Ranell Patrick, MD   3 months ago Lower respiratory infection   [facility], Delfino Lovett, NP   5 months ago Medicare annual wellness visit, subsequent   [facility], Ranell Patrick, MD   10 months ago Type 2 diabetes mellitus with diabetic polyneuropathy, without long-term current use of insulin [facility])   [facility], Ranell Patrick, MD       Future Appointments             In 2 weeks Wendie Agreste, MD [facility] at [place], [facility]              Signed Prescriptions Disp Refills   metFORMIN (GLUCOPHAGE) 1000 MG tablet 180 tablet 0    Sig: TAKE 1 TABLET BY MOUTH TWICE DAILY WITH A MEAL      Endocrinology:  Diabetes - Biguanides Failed - 07/11/2020  1:25 PM      Failed - Cr in normal range and within 360 days    Creat  Date Value Ref Range Status  02/28/2016 0.95 0.70 - 1.25 mg/dL Final    Comment:      For patients > or = 72 years of age: The upper reference limit for Creatinine is approximately  13% higher for people identified as African-American.      Creatinine, Ser  Date Value Ref Range Status  07/06/2020 1.29 (H) 0.76 - 1.27 mg/dL Final          Failed - eGFR in normal range and within 360 days    GFR calc Af Amer  Date Value Ref Range Status  07/06/2020 64 >59 mL/min/1.73 Final    Comment:    **In accordance with recommendations from the NKF-ASN Task force,**   [facility] is in the process of updating its eGFR calculation to the   2021 CKD-EPI creatinine equation that estimates kidney function   without a race variable.    GFR calc non Af Amer  Date Value Ref Range Status  07/06/2020 55 (L) >59 mL/min/1.73 Final          Passed - HBA1C is between 0 and 7.9 and within 180 days    Hemoglobin A1C  Date Value Ref Range Status  04/25/2020 7.0 (A) 4.0 - 5.6 % Final   Hgb A1c MFr Bld  Date Value Ref  Range Status  09/07/2019 8.3 (H) 4.8 - 5.6 % Final    Comment:             Prediabetes: 5.7 - 6.4          Diabetes: >6.4          Glycemic control for adults with diabetes: <7.0           Passed - Valid encounter within last 6 months    Recent Outpatient Visits           1 month ago Essential hypertension   [facility], [place], NP   2 months ago Type 2 diabetes mellitus with diabetic polyneuropathy, without long-term current use of insulin [facility])   [facility], Ranell Patrick, MD   3 months ago Lower respiratory infection   [facility], Delfino Lovett, NP   5 months ago Medicare annual wellness visit, subsequent   [facility], Ranell Patrick, MD   10 months ago Type 2 diabetes mellitus with diabetic polyneuropathy, without long-term current use of insulin [facility])   [facility], Ranell Patrick, MD       Future Appointments             In 2 weeks Carlota Raspberry Ranell Patrick, MD Primary Care at [place], [facility]

## 2020-07-11 NOTE — Telephone Encounter (Signed)
Last filled 06/10/20. Has upcoming appt. Controlled substance database (PDMP) reviewed. No concerns appreciated.  Refill ordered.

## 2020-07-12 ENCOUNTER — Encounter: Payer: Self-pay | Admitting: Family Medicine

## 2020-07-27 ENCOUNTER — Other Ambulatory Visit: Payer: Self-pay

## 2020-07-27 ENCOUNTER — Ambulatory Visit (INDEPENDENT_AMBULATORY_CARE_PROVIDER_SITE_OTHER): Payer: Medicare Other | Admitting: Family Medicine

## 2020-07-27 ENCOUNTER — Encounter: Payer: Self-pay | Admitting: Family Medicine

## 2020-07-27 VITALS — BP 146/86 | HR 79 | Temp 97.9°F | Ht 68.0 in | Wt 243.0 lb

## 2020-07-27 DIAGNOSIS — E785 Hyperlipidemia, unspecified: Secondary | ICD-10-CM

## 2020-07-27 DIAGNOSIS — I10 Essential (primary) hypertension: Secondary | ICD-10-CM

## 2020-07-27 DIAGNOSIS — R7989 Other specified abnormal findings of blood chemistry: Secondary | ICD-10-CM

## 2020-07-27 DIAGNOSIS — M25561 Pain in right knee: Secondary | ICD-10-CM | POA: Diagnosis not present

## 2020-07-27 DIAGNOSIS — E7800 Pure hypercholesterolemia, unspecified: Secondary | ICD-10-CM

## 2020-07-27 DIAGNOSIS — R609 Edema, unspecified: Secondary | ICD-10-CM | POA: Diagnosis not present

## 2020-07-27 DIAGNOSIS — E1142 Type 2 diabetes mellitus with diabetic polyneuropathy: Secondary | ICD-10-CM

## 2020-07-27 DIAGNOSIS — G8929 Other chronic pain: Secondary | ICD-10-CM

## 2020-07-27 MED ORDER — HYDROCHLOROTHIAZIDE 12.5 MG PO CAPS: 12.5000 mg | ORAL_CAPSULE | Freq: Every day | ORAL | 1 refills | Status: DC

## 2020-07-27 MED ORDER — LISINOPRIL 20 MG PO TABS: 20.0000 mg | ORAL_TABLET | Freq: Every day | ORAL | 3 refills | Status: DC

## 2020-07-27 MED ORDER — LIRAGLUTIDE 18 MG/3ML ~~LOC~~ SOPN: 1.8000 mg | PEN_INJECTOR | Freq: Every day | SUBCUTANEOUS | 5 refills | Status: DC

## 2020-07-27 MED ORDER — GLIMEPIRIDE 4 MG PO TABS: 4.0000 mg | ORAL_TABLET | Freq: Two times a day (BID) | ORAL | 1 refills | Status: DC

## 2020-07-27 MED ORDER — PRAVASTATIN SODIUM 80 MG PO TABS: 80.0000 mg | ORAL_TABLET | Freq: Every day | ORAL | 1 refills | Status: DC

## 2020-07-27 MED ORDER — METFORMIN HCL 1000 MG PO TABS: ORAL_TABLET | ORAL | 1 refills | Status: DC

## 2020-07-27 NOTE — Patient Instructions (Addendum)
Try adding hydrochlorothiazide once per day. Stop furosemide for now. Continue to avoid added salt, and use compression stockings to help prevent leg swelling. If swelling returns off the furosemide, follow up to discuss other changes.   No change in diabetes meds for now but if you continue to have lower blood sugars I would recommend decreasing glimepiride to half dose.  Recheck 1 month.   Return to the clinic or go to the nearest emergency room if any of your symptoms worsen or new symptoms occur.      If you have lab work done today you will be contacted with your lab results within the next 2 weeks.  If you have not heard from [place] then please contact us. The fastest way to get your results is to register for My Chart.   IF you received an x-ray today, you will receive an invoice from [facility]. Please contact [facility] at [phone] with questions or concerns regarding your invoice.   IF you received labwork today, you will receive an invoice from [facility]. Please contact [facility] at [phone] with questions or concerns regarding your invoice.   Our billing staff will not be able to assist you with questions regarding bills from these companies.  You will be contacted with the lab results as soon as they are available. The fastest way to get your results is to activate your My Chart account. Instructions are located on the last page of this paperwork. If you have not heard from [place] regarding the results in 2 weeks, please contact this office.

## 2020-07-27 NOTE — Progress Notes (Signed)
Subjective:  Patient ID: Daniel [name], male    DOB: 05/26/1951  Age: 70 y.o. MRN: [id number]  CC:  Chief Complaint  Patient presents with  . Follow-up    On type 2 diabetes. Pt reports no issues with BS since last OV. Pt states it has gone up over the holiday's, but that's from diet change. Pt states he is going to ask another provider about why he's getting the hick-ups a lot more offen now.    HPI KRISTOFOR MICHALOWSKI presents for   Diabetes: With polyneuropathy, microalbuminuria, hyperglycemia, and suspected CKD.  Last creatinine improved from 1.30 in August, to 0.95 in October. Increased in November, then lower few weeks ago.  Previous range 1.01-1.22. Improving A1c last visit. Gabapentin 300 mg every morning, 600 mg in the afternoon for neuropathy. has backed off to one at night d/t sedation - less sedation and neuropathy sx's stable.  Metformin 1000 mg twice daily, Victoza 1.8 mg daily, glimepiride 4 mg twice daily, he is on statin and ACE inhibitor.  Hypoglycemia precautions and recommendations to decrease glipizide if lows.  Some diet changes with birthdays and holidays.  victoza - changed to 1.2 for 10 days d/t cost of meds, back at 1.8mg  daily.  Home readings: fasting: lowest 70-71, no symptoms. Highest 166.  2hr PP 170's.  Microalbumin: Ratio 25 in July 2021. Optho, foot exam, pneumovax: Up-to-date  Lab Results  Component Value Date   HGBA1C 7.0 (A) 04/25/2020   HGBA1C 7.4 (A) 01/25/2020   HGBA1C 8.3 (H) 09/07/2019   Lab Results  Component Value Date   MICROALBUR 15.1 05/03/2015   LDLCALC 102 (H) 01/25/2020   CREATININE 1.29 (H) 07/06/2020   Chronic knee pain Total left knee replacement in August of last year.  Still has required tramadol 2/day for right knee pain with replacement sometime this year when discussed in October.  Plans on delaying surgery for another year. Concerned about time out for recovery.  No new side effects, tramadol treating pain. Taking tylenol  BID, less ibuprofen (had been taking 4 pills in am, few later in day, now no recent ibuprofen).   Hypertension: Lower readings reported at home at last visit.  Creatinine slightly higher in October.  Most recent creatinine stable at 1.29 Lisinopril 20 mg daily, Lasix 20 mg daily for edema.  Evaluated in November by my colleague.  Normal BNP, creatinine with higher at 1.40 on 05/30/2020.  Repeat January 5 improved.   Decreased salt intake, leg elevation, compression stockings discussed in November. Swelling better, wearing stockings sometimes. Rare take out/restaurant food. Min added salt.  Furosemide most days- 5 days per week - not in past 2 days. nno leg sweleing at this time.  Home readings: 140/75-88.  BP Readings from Last 3 Encounters:  07/27/20 (!) 146/86  05/30/20 (!) 168/77  05/03/20 (!) 157/82   Lab Results  Component Value Date   CREATININE 1.29 (H) 07/06/2020   Hyperlipidemia: Pravastatin 80mg  qd. Fasting. No new side effects.  Lab Results  Component Value Date   CHOL 196 01/25/2020   HDL 58 01/25/2020   LDLCALC 102 (H) 01/25/2020   TRIG 214 (H) 01/25/2020   CHOLHDL 3.4 01/25/2020   Lab Results  Component Value Date   ALT 12 05/30/2020   AST 11 05/30/2020   ALKPHOS 108 05/30/2020   BILITOT <0.2 05/30/2020     History Patient Active Problem List   Diagnosis Date Noted  . 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  .  Trigger finger, left little finger 02/04/2020  . Carpal tunnel syndrome, left upper limb 11/24/2019  . Unilateral primary osteoarthritis, left knee 11/02/2019  . Unilateral primary osteoarthritis, right knee 11/02/2019  . Carpal tunnel syndrome, right upper limb 02/18/2018  . Anxiety and depression 10/28/2016  . Gastroesophageal reflux disease without esophagitis 10/28/2016  . Seasonal allergic rhinitis due to pollen 10/28/2016  . Primary osteoarthritis of left knee 07/25/2015  . Essential hypertension 07/25/2015  . Hypercholesteremia  07/25/2015  . Type 2 diabetes mellitus with diabetic polyneuropathy, without long-term current use of insulin ([place]) 07/25/2015  . Pollen allergies 02/10/2014  . Allergic rhinitis due to animal (cat) (dog) hair and dander 09/15/2012  . Extrinsic asthma 09/13/2011  . Proteinuria 11/24/2010   Past Medical History:  Diagnosis Date  . Allergic rhinitis   . Allergy   . Anxiety   . Arthritis   . Benign hypertension   . Cataract   . Depression   . Diabetes mellitus ([place])    type 2   . GERD (gastroesophageal reflux disease)   . History of pneumonia   . HLD (hyperlipidemia)   . Neuromuscular disorder ([place])    pt denies at 8/19/visit   . Pneumonia    hx of several times per pt - last episode 4-5 years ago    Past Surgical History:  Procedure Laterality Date  . CARPAL TUNNEL RELEASE Bilateral 2020 Right, 2021 left  . CATARACT EXTRACTION     bilateral  . EYE SURGERY    . KNEE SURGERY     right  . 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   Procedure: LEFT TOTAL KNEE ARTHROPLASTY;  Surgeon: Mcarthur Rossetti, MD;  Location: [facility];  Service: Orthopedics;  Laterality: Left;   Allergies  Allergen Reactions  . Actos [Pioglitazone] Other (See Comments), Shortness Of Breath and Swelling    Blood sugar went up and down and sweating profusely  . Codeine Shortness Of Breath and Palpitations   Prior to Admission medications   Medication Sig Start Date End Date Taking? Authorizing Provider  aspirin EC 325 MG EC tablet Take 1 tablet (325 mg total) by mouth 2 (two) times daily after a meal. Patient taking differently: Take 81 mg by mouth 2 (two) times daily after a meal. 02/27/20  Yes Mcarthur Rossetti, MD  azithromycin (ZITHROMAX) 250 MG tablet Take 2 tabs on first day. Then take 1 tab daily. Finish entire supply. 03/21/20  Yes Maximiano Coss, NP  blood glucose meter kit and supplies 1 each by Other route as directed. Dispense based on patient and insurance preference. Check once per  day (variable times).  Accu check. 07/29/18  Yes Wendie Agreste, MD  cetirizine (ZYRTEC) 10 MG tablet Take 10 mg by mouth daily as needed for allergies.    Yes [provider]  Cholecalciferol (VITAMIN D) 125 MCG (5000 UT) CAPS Take 5,000 Units by mouth daily.   Yes [provider]  Cyanocobalamin (VITAMIN B 12 PO) Take 1,000 mg by mouth daily.    Yes [provider]  diclofenac Sodium (VOLTAREN) 1 % GEL Apply 2 g topically daily.   Yes [provider]  fluticasone (FLONASE) 50 MCG/ACT nasal spray Place 1 spray into both nostrils daily.   Yes [provider]  furosemide (LASIX) 20 MG tablet Take 1 tablet (20 mg total) by mouth daily. 05/30/20  Yes Maximiano Coss, NP  gabapentin (NEURONTIN) 300 MG capsule Take 1-2 capsules (300-600 mg total) by mouth 2 (two) times daily  as needed. Patient taking differently: Take 300 mg by mouth 2 (two) times daily. 01/25/20  Yes Wendie Agreste, MD  glimepiride (AMARYL) 4 MG tablet Take 1 tablet by mouth twice daily 06/10/20  Yes Wendie Agreste, MD  guaiFENesin-dextromethorphan [facility] DM) 100-10 MG/5ML syrup Take 5 mLs by mouth every 4 (four) hours as needed for cough. 03/21/20  Yes Maximiano Coss, NP  ibuprofen (ADVIL,MOTRIN) 200 MG tablet Take 400 mg by mouth in the morning and at bedtime.    Yes [provider]  Lancets (ONETOUCH DELICA PLUS [id number]) [place] USE 1 TO CHECK GLUCOSE ONCE DAILY 08/04/18  Yes [provider]  liraglutide (VICTOZA) 18 MG/3ML SOPN Inject 0.3 mLs (1.8 mg total) into the skin daily. I 01/25/20  Yes Wendie Agreste, MD  lisinopril (ZESTRIL) 20 MG tablet Take 1 tablet (20 mg total) by mouth daily. 09/07/19  Yes Wendie Agreste, MD  metFORMIN (GLUCOPHAGE) 1000 MG tablet TAKE 1 TABLET BY MOUTH TWICE DAILY WITH A MEAL 07/11/20  Yes Wendie Agreste, MD  methocarbamol (ROBAXIN) 500 MG tablet Take 1 tablet (500 mg total) by mouth every 6 (six) hours as needed for muscle  spasms. 02/27/20  Yes Mcarthur Rossetti, MD  montelukast (SINGULAIR) 10 MG tablet Take 1 tablet (10 mg total) by mouth at bedtime. 05/30/20  Yes Maximiano Coss, NP  Multiple Vitamin (MULTIVITAMIN WITH MINERALS) TABS tablet Take 1 tablet by mouth daily.   Yes [provider]  omeprazole (PRILOSEC) 20 MG capsule Take 1 capsule (20 mg total) by mouth daily. 09/07/19  Yes Wendie Agreste, MD  ondansetron (ZOFRAN ODT) 4 MG disintegrating tablet Take 1 tablet (4 mg total) by mouth every 8 (eight) hours as needed for nausea or vomiting. 02/27/20  Yes Mcarthur Rossetti, MD  [facility] ULTRA test strip USE 1 STRIP TO CHECK GLUCOSE ONCE DAILY 11/25/18  Yes Wendie Agreste, MD  oxyCODONE (OXY IR/ROXICODONE) 5 MG immediate release tablet Take 1-2 tablets (5-10 mg total) by mouth every 6 (six) hours as needed for moderate pain (pain score 4-6). 04/01/20  Yes Mcarthur Rossetti, MD  pravastatin (PRAVACHOL) 80 MG tablet Take 1 tablet (80 mg total) by mouth daily. 09/07/19  Yes Wendie Agreste, MD  sertraline (ZOLOFT) 100 MG tablet Take 2 tablets (200 mg total) by mouth daily. Start with one per day for initial 2 weeks, then increase to 2 per day. Patient taking differently: Take 100-200 mg by mouth daily. 09/07/19  Yes Wendie Agreste, MD  sodium chloride (OCEAN) 0.65 % SOLN nasal spray Place 1 spray into both nostrils as needed for congestion.   Yes [provider]  traMADol (ULTRAM) 50 MG tablet TAKE 1 TABLET BY MOUTH EVERY 6 HOURS AS NEEDED 07/11/20  Yes Wendie Agreste, MD  vitamin E 180 MG (400 UNITS) capsule Take 400 Units by mouth daily.   Yes [provider]   Social History   Socioeconomic History  . Marital status: Married    Spouse name: Not on file  . Number of children: 3  . Years of education: 22  . Highest education level: Not on file  Occupational History  . Occupation: Dealer, Company secretary  Tobacco Use  . Smoking status: Never Smoker  .  Smokeless tobacco: Never Used  Vaping Use  . Vaping Use: Never used  Substance and Sexual Activity  . Alcohol use: No  . Drug use: No  . Sexual activity: Not Currently  Other Topics Concern  .  Not on file  Social History Narrative  . Not on file   Social Determinants of Health   Financial Resource Strain: Not on file  Food Insecurity: Not on file  Transportation Needs: Not on file  Physical Activity: Not on file  Stress: Not on file  Social Connections: Not on file  Intimate Partner Violence: Not on file    Review of Systems  Constitutional: Negative for fatigue and unexpected weight change.  Eyes: Negative for visual disturbance.  Respiratory: Negative for cough, chest tightness and shortness of breath.   Cardiovascular: Negative for chest pain, palpitations and leg swelling.  Gastrointestinal: Negative for abdominal pain and blood in stool.  Musculoskeletal: Positive for arthralgias (R knee).  Neurological: Negative for dizziness, light-headedness and headaches.     Objective:   Vitals:   07/27/20 0858 07/27/20 0903  BP: (!) 160/83 (!) 146/86  Pulse: 79   Temp: 97.9 F (36.6 C)   TempSrc: Temporal   SpO2: 97%   Weight: 243 lb (110.2 kg)   Height: $Remove'5\' 8"'KZyCCoj$  (1.727 m)      Physical Exam Vitals reviewed.  Constitutional:      Appearance: He is well-developed and well-nourished.  HENT:     Head: Normocephalic and atraumatic.  Eyes:     Extraocular Movements: EOM normal.     Pupils: Pupils are equal, round, and reactive to light.  Neck:     Vascular: No carotid bruit or JVD.  Cardiovascular:     Rate and Rhythm: Normal rate and regular rhythm.     Heart sounds: Normal heart sounds. No murmur heard.   Pulmonary:     Effort: Pulmonary effort is normal.     Breath sounds: Normal breath sounds. No rales.  Musculoskeletal:     Right lower leg: Edema (1+ lower third right greater than left.  No wounds/erythema.) present.     Left lower leg: Edema present.   Skin:    General: Skin is warm and dry.  Neurological:     Mental Status: He is alert and oriented to person, place, and time.  Psychiatric:        Mood and Affect: Mood and affect normal.     32 minutes spent during visit, greater than 50% counseling and assimilation of information, chart review, and discussion of plan.   Assessment & Plan:  TERRIN MEDDAUGH is a 70 y.o. male . Essential hypertension - Plan: Comprehensive metabolic panel, hydrochlorothiazide (MICROZIDE) 12.5 MG capsule, lisinopril (ZESTRIL) 20 MG tablet Dependent edema Elevated serum creatinine  -Decreased control, along with increasing creatinine.  Will stop furosemide as minimal change in symptoms few days off medication.  Start more frequent use of compression stockings, avoid salt/added salt.  Add HCTZ 12.5 mg daily.  Continue lisinopril.  Potential R/B/SE discussed.  Recheck 1 month  Type 2 diabetes mellitus with diabetic polyneuropathy, without long-term current use of insulin (HCC) - Plan: Hemoglobin A1c, glimepiride (AMARYL) 4 MG tablet, metFORMIN (GLUCOPHAGE) 1000 MG tablet, liraglutide (VICTOZA) 18 MG/3ML SOPN  -Some dietary discretion of the holidays but minimal.  Did decrease Victoza temporarily.  Check A1c.  Hypoglycemia precautions with use of sulfonylurea and possible half dosing discussed.  Recheck 1 month.  Check A1c.  Chronic pain of right knee  -Stable with tramadol, consistent use.  No changes.  Agree with minimizing ibuprofen with elevated creatinine as above.  Hyperlipidemia, unspecified hyperlipidemia type - Plan: Lipid panel Hypercholesteremia - Plan: pravastatin (PRAVACHOL) 80 MG tablet  -Check lipids, tolerating pravastatin, continue  same  Meds ordered this encounter  Medications  . hydrochlorothiazide (MICROZIDE) 12.5 MG capsule    Sig: Take 1 capsule (12.5 mg total) by mouth daily.    Dispense:  30 capsule    Refill:  1  . glimepiride (AMARYL) 4 MG tablet    Sig: Take 1 tablet (4 mg  total) by mouth 2 (two) times daily.    Dispense:  180 tablet    Refill:  1  . metFORMIN (GLUCOPHAGE) 1000 MG tablet    Sig: TAKE 1 TABLET BY MOUTH TWICE DAILY WITH A MEAL    Dispense:  180 tablet    Refill:  1  . liraglutide (VICTOZA) 18 MG/3ML SOPN    Sig: Inject 1.8 mg into the skin daily. I    Dispense:  3 mL    Refill:  5  . lisinopril (ZESTRIL) 20 MG tablet    Sig: Take 1 tablet (20 mg total) by mouth daily.    Dispense:  90 tablet    Refill:  3  . pravastatin (PRAVACHOL) 80 MG tablet    Sig: Take 1 tablet (80 mg total) by mouth daily.    Dispense:  90 tablet    Refill:  1   Patient Instructions   Try adding hydrochlorothiazide once per day. Stop furosemide for now. Continue to avoid added salt, and use compression stockings to help prevent leg swelling. If swelling returns off the furosemide, follow up to discuss other changes.   No change in diabetes meds for now but if you continue to have lower blood sugars I would recommend decreasing glimepiride to half dose.  Recheck 1 month.   Return to the clinic or go to the nearest emergency room if any of your symptoms worsen or new symptoms occur.      If you have lab work done today you will be contacted with your lab results within the next 2 weeks.  If you have not heard from [place] then please contact us. The fastest way to get your results is to register for My Chart.   IF you received an x-ray today, you will receive an invoice from [facility]. Please contact [facility] at [phone] with questions or concerns regarding your invoice.   IF you received labwork today, you will receive an invoice from [address]. Please contact [facility] at [phone] with questions or concerns regarding your invoice.   Our billing staff will not be able to assist you with questions regarding bills from these companies.  You will be contacted with the lab results as soon as they are available. The fastest way to get  your results is to activate your My Chart account. Instructions are located on the last page of this paperwork. If you have not heard from [place] regarding the results in 2 weeks, please contact this office.       Signed, Merri Ray, MD [facility]

## 2020-07-28 LAB — COMPREHENSIVE METABOLIC PANEL
ALT: 14 IU/L (ref 0–44)
AST: 15 IU/L (ref 0–40)
Albumin/Globulin Ratio: 1.5 (ref 1.2–2.2)
Albumin: 4.1 g/dL (ref 3.7–4.7)
Alkaline Phosphatase: 92 IU/L (ref 44–121)
BUN/Creatinine Ratio: 19 (ref 10–24)
BUN: 23 mg/dL (ref 8–27)
Bilirubin Total: <0.2 mg/dL (ref 0.0–1.2)
CO2: 22 mmol/L (ref 20–29)
Calcium: 9.1 mg/dL (ref 8.6–10.2)
Chloride: 100 mmol/L (ref 96–106)
Creatinine, Ser: 1.23 mg/dL (ref 0.76–1.27)
GFR calc Af Amer: 68 mL/min/{1.73_m2} (ref >59)
GFR calc non Af Amer: 59 mL/min/{1.73_m2} — ABNORMAL LOW (ref >59)
Globulin, Total: 2.7 g/dL (ref 1.5–4.5)
Glucose: 121 mg/dL — ABNORMAL HIGH (ref 65–99)
Potassium: 5.1 mmol/L (ref 3.5–5.2)
Sodium: 139 mmol/L (ref 134–144)
Total Protein: 6.8 g/dL (ref 6.0–8.5)

## 2020-07-28 LAB — LIPID PANEL
Chol/HDL Ratio: 4.2 ratio (ref 0.0–5.0)
Cholesterol, Total: 241 mg/dL — ABNORMAL HIGH (ref 100–199)
HDL: 57 mg/dL (ref >39)
LDL Chol Calc (NIH): 153 mg/dL — ABNORMAL HIGH (ref 0–99)
Triglycerides: 174 mg/dL — ABNORMAL HIGH (ref 0–149)
VLDL Cholesterol Cal: 31 mg/dL (ref 5–40)

## 2020-07-28 LAB — HEMOGLOBIN A1C
Est. average glucose Bld gHb Est-mCnc: 203 mg/dL
Hgb A1c MFr Bld: 8.7 % — ABNORMAL HIGH (ref 4.8–5.6)

## 2020-08-06 ENCOUNTER — Encounter: Payer: Self-pay | Admitting: Family Medicine

## 2020-08-15 ENCOUNTER — Other Ambulatory Visit: Payer: Self-pay | Admitting: Family Medicine

## 2020-08-15 DIAGNOSIS — M25562 Pain in left knee: Secondary | ICD-10-CM

## 2020-08-15 DIAGNOSIS — G8929 Other chronic pain: Secondary | ICD-10-CM

## 2020-08-15 NOTE — Telephone Encounter (Signed)
Requested medications are due for refill today.  yes  Requested medications are on the active medications list.  yes  Last refill. 07/11/2020  Future visit scheduled.   yes  Notes to clinic.  Not delegated.

## 2020-08-16 NOTE — Telephone Encounter (Signed)
Last filled 07/12/20. Discussed recently. Refill placed.

## 2020-09-02 ENCOUNTER — Ambulatory Visit (INDEPENDENT_AMBULATORY_CARE_PROVIDER_SITE_OTHER): Payer: Medicare Other | Admitting: Family Medicine

## 2020-09-02 ENCOUNTER — Other Ambulatory Visit: Payer: Self-pay

## 2020-09-02 ENCOUNTER — Encounter: Payer: Self-pay | Admitting: Family Medicine

## 2020-09-02 VITALS — BP 140/78 | HR 82 | Temp 97.6°F | Ht 68.0 in | Wt 243.8 lb

## 2020-09-02 DIAGNOSIS — E1142 Type 2 diabetes mellitus with diabetic polyneuropathy: Secondary | ICD-10-CM

## 2020-09-02 DIAGNOSIS — I10 Essential (primary) hypertension: Secondary | ICD-10-CM | POA: Diagnosis not present

## 2020-09-02 LAB — BASIC METABOLIC PANEL
BUN/Creatinine Ratio: 27 — ABNORMAL HIGH (ref 10–24)
BUN: 32 mg/dL — ABNORMAL HIGH (ref 8–27)
CO2: 19 mmol/L — ABNORMAL LOW (ref 20–29)
Calcium: 9.4 mg/dL (ref 8.6–10.2)
Chloride: 100 mmol/L (ref 96–106)
Creatinine, Ser: 1.17 mg/dL (ref 0.76–1.27)
Glucose: 167 mg/dL — ABNORMAL HIGH (ref 65–99)
Potassium: 5.5 mmol/L — ABNORMAL HIGH (ref 3.5–5.2)
Sodium: 136 mmol/L (ref 134–144)
eGFR: 67 mL/min/{1.73_m2} (ref >59)

## 2020-09-02 NOTE — Patient Instructions (Addendum)
Keep up the good work with diet changes. Low intensity exercise/activity should also help blood sugar and blood pressure.  Goal blood pressure is less than 130/80.  If she consistently has readings up in the 140s 150s, let me know and we can try a higher dose of the hydrochlorothiazide.  No changes for now.  Recheck in 2 months for diabetes.  Let me know if there are questions sooner and take care.    Managing Your Hypertension Hypertension, also called high blood pressure, is when the force of the blood pressing against the walls of the arteries is too strong. Arteries are blood vessels that carry blood from your heart throughout your body. Hypertension forces the heart to work harder to pump blood and may cause the arteries to become narrow or stiff. Understanding blood pressure readings Your personal target blood pressure may vary depending on your medical conditions, your age, and other factors. A blood pressure reading includes a higher number over a lower number. Ideally, your blood pressure should be below 120/80. You should know that:  The first, or top, number is called the systolic pressure. It is a measure of the pressure in your arteries as your heart beats.  The second, or bottom number, is called the diastolic pressure. It is a measure of the pressure in your arteries as the heart relaxes. Blood pressure is classified into four stages. Based on your blood pressure reading, your health care provider may use the following stages to determine what type of treatment you need, if any. Systolic pressure and diastolic pressure are measured in a unit called mmHg. Normal  Systolic pressure: below 510.  Diastolic pressure: below 80. Elevated  Systolic pressure: 258-527.  Diastolic pressure: below 80. Hypertension stage 1  Systolic pressure: 782-423.  Diastolic pressure: 53-61. Hypertension stage 2  Systolic pressure: 443 or above.  Diastolic pressure: 90 or above. How can this  condition affect me? Managing your hypertension is an important responsibility. Over time, hypertension can damage the arteries and decrease blood flow to important parts of the body, including the brain, heart, and kidneys. Having untreated or uncontrolled hypertension can lead to:  A heart attack.  A stroke.  A weakened blood vessel (aneurysm).  Heart failure.  Kidney damage.  Eye damage.  Metabolic syndrome.  Memory and concentration problems.  Vascular dementia. What actions can I take to manage this condition? Hypertension can be managed by making lifestyle changes and possibly by taking medicines. Your health care provider will help you make a plan to bring your blood pressure within a normal range. Nutrition  Eat a diet that is high in fiber and potassium, and low in salt (sodium), added sugar, and fat. An example eating plan is called the Dietary Approaches to Stop Hypertension (DASH) diet. To eat this way: ? Eat plenty of fresh fruits and vegetables. Try to fill one-half of your plate at each meal with fruits and vegetables. ? Eat whole grains, such as whole-wheat pasta, brown rice, or whole-grain bread. Fill about one-fourth of your plate with whole grains. ? Eat low-fat dairy products. ? Avoid fatty cuts of meat, processed or cured meats, and poultry with skin. Fill about one-fourth of your plate with lean proteins such as fish, chicken without skin, beans, eggs, and tofu. ? Avoid pre-made and processed foods. These tend to be higher in sodium, added sugar, and fat.  Reduce your daily sodium intake. Most people with hypertension should eat less than 1,500 mg of sodium a day.  Lifestyle  Work with your health care provider to maintain a healthy body weight or to lose weight. Ask what an ideal weight is for you.  Get at least 30 minutes of exercise that causes your heart to beat faster (aerobic exercise) most days of the week. Activities may include walking, swimming, or  biking.  Include exercise to strengthen your muscles (resistance exercise), such as weight lifting, as part of your weekly exercise routine. Try to do these types of exercises for 30 minutes at least 3 days a week.  Do not use any products that contain nicotine or tobacco, such as cigarettes, e-cigarettes, and chewing tobacco. If you need help quitting, ask your health care provider.  Control any long-term (chronic) conditions you have, such as high cholesterol or diabetes.  Identify your sources of stress and find ways to manage stress. This may include meditation, deep breathing, or making time for fun activities.   Alcohol use  Do not drink alcohol if: ? Your health care provider tells you not to drink. ? You are pregnant, may be pregnant, or are planning to become pregnant.  If you drink alcohol: ? Limit how much you use to:  0-1 drink a day for women.  0-2 drinks a day for men. ? Be aware of how much alcohol is in your drink. In the [place] one drink equals one 12 oz bottle of beer (355 mL), one 5 oz glass of wine (148 mL), or one 1 oz glass of hard liquor (44 mL). Medicines Your health care provider may prescribe medicine if lifestyle changes are not enough to get your blood pressure under control and if:  Your systolic blood pressure is 130 or higher.  Your diastolic blood pressure is 80 or higher. Take medicines only as told by your health care provider. Follow the directions carefully. Blood pressure medicines must be taken as told by your health care provider. The medicine does not work as well when you skip doses. Skipping doses also puts you at risk for problems. Monitoring Before you monitor your blood pressure:  Do not smoke, drink caffeinated beverages, or exercise within 30 minutes before taking a measurement.  Use the bathroom and empty your bladder (urinate).  Sit quietly for at least 5 minutes before taking measurements. Monitor your blood pressure at home as  told by your health care provider. To do this:  Sit with your back straight and supported.  Place your feet flat on the floor. Do not cross your legs.  Support your arm on a flat surface, such as a table. Make sure your upper arm is at heart level.  Each time you measure, take two or three readings one minute apart and record the results. You may also need to have your blood pressure checked regularly by your health care provider.   General information  Talk with your health care provider about your diet, exercise habits, and other lifestyle factors that may be contributing to hypertension.  Review all the medicines you take with your health care provider because there may be side effects or interactions.  Keep all visits as told by your health care provider. Your health care provider can help you create and adjust your plan for managing your high blood pressure. Where to find more information  National Heart, Lung, and Blood Institute: https://wilson-eaton.com/  American Heart Association: www.heart.org Contact a health care provider if:  You think you are having a reaction to medicines you have taken.  You have repeated (recurrent)  headaches.  You feel dizzy.  You have swelling in your ankles.  You have trouble with your vision. Get help right away if:  You develop a severe headache or confusion.  You have unusual weakness or numbness, or you feel faint.  You have severe pain in your chest or abdomen.  You vomit repeatedly.  You have trouble breathing. These symptoms may represent a serious problem that is an emergency. Do not wait to see if the symptoms will go away. Get medical help right away. Call your local emergency services (911 in the [place] Do not drive yourself to the hospital. Summary  Hypertension is when the force of blood pumping through your arteries is too strong. If this condition is not controlled, it may put you at risk for serious complications.  Your  personal target blood pressure may vary depending on your medical conditions, your age, and other factors. For most people, a normal blood pressure is less than 120/80.  Hypertension is managed by lifestyle changes, medicines, or both.  Lifestyle changes to help manage hypertension include losing weight, eating a healthy, low-sodium diet, exercising more, stopping smoking, and limiting alcohol. This information is not intended to replace advice given to you by your health care provider. Make sure you discuss any questions you have with your health care provider. Document Revised: 07/24/2019 Document Reviewed: 05/19/2019 Elsevier Patient Education  2021 [employer].    If you have lab work done today you will be contacted with your lab results within the next 2 weeks.  If you have not heard from [place] then please contact us. The fastest way to get your results is to register for My Chart.   IF you received an x-ray today, you will receive an invoice from [facility]. Please contact [facility] at [phone] with questions or concerns regarding your invoice.   IF you received labwork today, you will receive an invoice from [facility]. Please contact [facility] at [phone] with questions or concerns regarding your invoice.   Our billing staff will not be able to assist you with questions regarding bills from these companies.  You will be contacted with the lab results as soon as they are available. The fastest way to get your results is to activate your My Chart account. Instructions are located on the last page of this paperwork. If you have not heard from [place] regarding the results in 2 weeks, please contact this office.      Managing Your Hypertension Hypertension, also called high blood pressure, is when the force of the blood pressing against the walls of the arteries is too strong. Arteries are blood vessels that carry blood from your heart throughout your body.  Hypertension forces the heart to work harder to pump blood and may cause the arteries to become narrow or stiff. Understanding blood pressure readings Your personal target blood pressure may vary depending on your medical conditions, your age, and other factors. A blood pressure reading includes a higher number over a lower number. Ideally, your blood pressure should be below 120/80. You should know that:  The first, or top, number is called the systolic pressure. It is a measure of the pressure in your arteries as your heart beats.  The second, or bottom number, is called the diastolic pressure. It is a measure of the pressure in your arteries as the heart relaxes. Blood pressure is classified into four stages. Based on your blood pressure reading, your health care provider may use the following stages  to determine what type of treatment you need, if any. Systolic pressure and diastolic pressure are measured in a unit called mmHg. Normal  Systolic pressure: below 938.  Diastolic pressure: below 80. Elevated  Systolic pressure: 182-993.  Diastolic pressure: below 80. Hypertension stage 1  Systolic pressure: 716-967.  Diastolic pressure: 89-38. Hypertension stage 2  Systolic pressure: 101 or above.  Diastolic pressure: 90 or above. How can this condition affect me? Managing your hypertension is an important responsibility. Over time, hypertension can damage the arteries and decrease blood flow to important parts of the body, including the brain, heart, and kidneys. Having untreated or uncontrolled hypertension can lead to:  A heart attack.  A stroke.  A weakened blood vessel (aneurysm).  Heart failure.  Kidney damage.  Eye damage.  Metabolic syndrome.  Memory and concentration problems.  Vascular dementia. What actions can I take to manage this condition? Hypertension can be managed by making lifestyle changes and possibly by taking medicines. Your health care provider  will help you make a plan to bring your blood pressure within a normal range. Nutrition  Eat a diet that is high in fiber and potassium, and low in salt (sodium), added sugar, and fat. An example eating plan is called the Dietary Approaches to Stop Hypertension (DASH) diet. To eat this way: ? Eat plenty of fresh fruits and vegetables. Try to fill one-half of your plate at each meal with fruits and vegetables. ? Eat whole grains, such as whole-wheat pasta, brown rice, or whole-grain bread. Fill about one-fourth of your plate with whole grains. ? Eat low-fat dairy products. ? Avoid fatty cuts of meat, processed or cured meats, and poultry with skin. Fill about one-fourth of your plate with lean proteins such as fish, chicken without skin, beans, eggs, and tofu. ? Avoid pre-made and processed foods. These tend to be higher in sodium, added sugar, and fat.  Reduce your daily sodium intake. Most people with hypertension should eat less than 1,500 mg of sodium a day.   Lifestyle  Work with your health care provider to maintain a healthy body weight or to lose weight. Ask what an ideal weight is for you.  Get at least 30 minutes of exercise that causes your heart to beat faster (aerobic exercise) most days of the week. Activities may include walking, swimming, or biking.  Include exercise to strengthen your muscles (resistance exercise), such as weight lifting, as part of your weekly exercise routine. Try to do these types of exercises for 30 minutes at least 3 days a week.  Do not use any products that contain nicotine or tobacco, such as cigarettes, e-cigarettes, and chewing tobacco. If you need help quitting, ask your health care provider.  Control any long-term (chronic) conditions you have, such as high cholesterol or diabetes.  Identify your sources of stress and find ways to manage stress. This may include meditation, deep breathing, or making time for fun activities.   Alcohol use  Do not  drink alcohol if: ? Your health care provider tells you not to drink. ? You are pregnant, may be pregnant, or are planning to become pregnant.  If you drink alcohol: ? Limit how much you use to:  0-1 drink a day for women.  0-2 drinks a day for men. ? Be aware of how much alcohol is in your drink. In the [place] one drink equals one 12 oz bottle of beer (355 mL), one 5 oz glass of wine (148 mL), or one  1 oz glass of hard liquor (44 mL). Medicines Your health care provider may prescribe medicine if lifestyle changes are not enough to get your blood pressure under control and if:  Your systolic blood pressure is 130 or higher.  Your diastolic blood pressure is 80 or higher. Take medicines only as told by your health care provider. Follow the directions carefully. Blood pressure medicines must be taken as told by your health care provider. The medicine does not work as well when you skip doses. Skipping doses also puts you at risk for problems. Monitoring Before you monitor your blood pressure:  Do not smoke, drink caffeinated beverages, or exercise within 30 minutes before taking a measurement.  Use the bathroom and empty your bladder (urinate).  Sit quietly for at least 5 minutes before taking measurements. Monitor your blood pressure at home as told by your health care provider. To do this:  Sit with your back straight and supported.  Place your feet flat on the floor. Do not cross your legs.  Support your arm on a flat surface, such as a table. Make sure your upper arm is at heart level.  Each time you measure, take two or three readings one minute apart and record the results. You may also need to have your blood pressure checked regularly by your health care provider.   General information  Talk with your health care provider about your diet, exercise habits, and other lifestyle factors that may be contributing to hypertension.  Review all the medicines you take with your  health care provider because there may be side effects or interactions.  Keep all visits as told by your health care provider. Your health care provider can help you create and adjust your plan for managing your high blood pressure. Where to find more information  National Heart, Lung, and Blood Institute: https://wilson-eaton.com/  American Heart Association: www.heart.org Contact a health care provider if:  You think you are having a reaction to medicines you have taken.  You have repeated (recurrent) headaches.  You feel dizzy.  You have swelling in your ankles.  You have trouble with your vision. Get help right away if:  You develop a severe headache or confusion.  You have unusual weakness or numbness, or you feel faint.  You have severe pain in your chest or abdomen.  You vomit repeatedly.  You have trouble breathing. These symptoms may represent a serious problem that is an emergency. Do not wait to see if the symptoms will go away. Get medical help right away. Call your local emergency services (911 in the [place] Do not drive yourself to the hospital. Summary  Hypertension is when the force of blood pumping through your arteries is too strong. If this condition is not controlled, it may put you at risk for serious complications.  Your personal target blood pressure may vary depending on your medical conditions, your age, and other factors. For most people, a normal blood pressure is less than 120/80.  Hypertension is managed by lifestyle changes, medicines, or both.  Lifestyle changes to help manage hypertension include losing weight, eating a healthy, low-sodium diet, exercising more, stopping smoking, and limiting alcohol. This information is not intended to replace advice given to you by your health care provider. Make sure you discuss any questions you have with your health care provider. Document Revised: 07/24/2019 Document Reviewed: 05/19/2019 Elsevier Patient  Education  2021 [employer].

## 2020-09-02 NOTE — Progress Notes (Signed)
Subjective:  Patient ID: Daniel [name], male    DOB: [birth year]  Age: 70 y.o. MRN: [id number]  CC:  Chief Complaint  Patient presents with  . Hypertension    1 m f/u   . Leg Swelling    better    HPI Daniel [name] presents for   Hypertension: With dependent edema.  Decreased control at January 26 visit.  Furosemide was discontinued as minimal change in symptoms few days off meds and had increasing creatinine late last year.  Recommended increased use of compression stockings and avoidance of salt/added salt to diet.  Added HCTZ 12.5 mg daily, continued lisinopril 20 mg daily. Swelling seems better. Only uses compression stockings sometimes.  Cut back on salt intake. No new side effects on meds. Rare missed dose lisinopril.  Home readings: 135-150/68-78.  BP Readings from Last 3 Encounters:  09/02/20 140/78  07/27/20 (!) 146/86  05/30/20 (!) 168/77   Lab Results  Component Value Date   CREATININE 1.23 07/27/2020    Diabetes: With polyneuropathy, microalbuminuria, hyperglycemia.  Decreased diet adherence prior to last visit.  A1c had increased.  He was continued on Victoza, glimepiride BID,Metformin.  He is on statin and ACE inhibitor.  Did report some readings in the 70-71 range last visit, no med changes.  Plans on improved diet. Has cut back bread significantly and sweets.  No sugar containing beverages.  No symptomatic lows.  Home readings - fasting: low 100 range - 90-130, rare 179. 2hr postprandial 160-180. No 200's.  Plans more activity/exercise with yard work soon.  Wt Readings from Last 3 Encounters:  09/02/20 243 lb 12.8 oz (110.6 kg)  07/27/20 243 lb (110.2 kg)  05/30/20 239 lb 12.8 oz (108.8 kg)   Lab Results  Component Value Date   HGBA1C 8.7 (H) 07/27/2020   HGBA1C 7.0 (A) 04/25/2020   HGBA1C 7.4 (A) 01/25/2020   Lab Results  Component Value Date   MICROALBUR 15.1 05/03/2015   LDLCALC 153 (H) 07/27/2020   CREATININE 1.23 07/27/2020   R knee  pain: Had left TKR. Still R knee pain with surgery. Spouse having surgery soon. Delaying surgery in R knee for about a year. Tramadol 1-2 in am, 1 at night work well.   History Patient Active Problem List   Diagnosis Date Noted  . 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  . Trigger finger, left little finger 02/04/2020  . Carpal tunnel syndrome, left upper limb 11/24/2019  . Unilateral primary osteoarthritis, left knee 11/02/2019  . Unilateral primary osteoarthritis, right knee 11/02/2019  . Carpal tunnel syndrome, right upper limb 02/18/2018  . Anxiety and depression 10/28/2016  . Gastroesophageal reflux disease without esophagitis 10/28/2016  . Seasonal allergic rhinitis due to pollen 10/28/2016  . Primary osteoarthritis of left knee 07/25/2015  . Essential hypertension 07/25/2015  . Hypercholesteremia 07/25/2015  . Type 2 diabetes mellitus with diabetic polyneuropathy, without long-term current use of insulin ([place]) 07/25/2015  . Pollen allergies 02/10/2014  . Allergic rhinitis due to animal (cat) (dog) hair and dander 09/15/2012  . Extrinsic asthma 09/13/2011  . Proteinuria 11/24/2010   Past Medical History:  Diagnosis Date  . Allergic rhinitis   . Allergy   . Anxiety   . Arthritis   . Benign hypertension   . Cataract   . Depression   . Diabetes mellitus ([place])    type 2   . GERD (gastroesophageal reflux disease)   . History of pneumonia   . HLD (hyperlipidemia)   .  Neuromuscular disorder ([place])    pt denies at 8/19/visit   . Pneumonia    hx of several times per pt - last episode 4-5 years ago    Past Surgical History:  Procedure Laterality Date  . CARPAL TUNNEL RELEASE Bilateral 2020 Right, 2021 left  . CATARACT EXTRACTION     bilateral  . EYE SURGERY    . KNEE SURGERY     right  . 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   Procedure: LEFT TOTAL KNEE ARTHROPLASTY;  Surgeon: Mcarthur Rossetti, MD;  Location: [facility];  Service: Orthopedics;   Laterality: Left;   Allergies  Allergen Reactions  . Actos [Pioglitazone] Other (See Comments), Shortness Of Breath and Swelling    Blood sugar went up and down and sweating profusely  . Codeine Shortness Of Breath and Palpitations   Prior to Admission medications   Medication Sig Start Date End Date Taking? Authorizing Provider  aspirin EC 325 MG EC tablet Take 1 tablet (325 mg total) by mouth 2 (two) times daily after a meal. Patient taking differently: Take 81 mg by mouth 2 (two) times daily after a meal. 02/27/20  Yes Mcarthur Rossetti, MD  blood glucose meter kit and supplies 1 each by Other route as directed. Dispense based on patient and insurance preference. Check once per day (variable times).  Accu check. 07/29/18  Yes Wendie Agreste, MD  cetirizine (ZYRTEC) 10 MG tablet Take 10 mg by mouth daily as needed for allergies.    Yes [provider]  Cholecalciferol (VITAMIN D) 125 MCG (5000 UT) CAPS Take 5,000 Units by mouth daily.   Yes [provider]  Cyanocobalamin (VITAMIN B 12 PO) Take 1,000 mg by mouth daily.    Yes [provider]  diclofenac Sodium (VOLTAREN) 1 % GEL Apply 2 g topically daily.   Yes [provider]  fluticasone (FLONASE) 50 MCG/ACT nasal spray Place 1 spray into both nostrils daily.   Yes [provider]  gabapentin (NEURONTIN) 300 MG capsule Take 1-2 capsules (300-600 mg total) by mouth 2 (two) times daily as needed. Patient taking differently: Take 300 mg by mouth 2 (two) times daily. 01/25/20  Yes Wendie Agreste, MD  glimepiride (AMARYL) 4 MG tablet Take 1 tablet (4 mg total) by mouth 2 (two) times daily. 07/27/20  Yes Wendie Agreste, MD  hydrochlorothiazide (MICROZIDE) 12.5 MG capsule Take 1 capsule (12.5 mg total) by mouth daily. 07/27/20  Yes Wendie Agreste, MD  ibuprofen (ADVIL,MOTRIN) 200 MG tablet Take 400 mg by mouth in the morning and at bedtime.    Yes [provider]  Lancets  (ONETOUCH DELICA PLUS CVUDTH43O) [place] USE 1 TO CHECK GLUCOSE ONCE DAILY 08/04/18  Yes [provider]  liraglutide (VICTOZA) 18 MG/3ML SOPN Inject 1.8 mg into the skin daily. I 07/27/20  Yes Wendie Agreste, MD  lisinopril (ZESTRIL) 20 MG tablet Take 1 tablet (20 mg total) by mouth daily. 07/27/20  Yes Wendie Agreste, MD  metFORMIN (GLUCOPHAGE) 1000 MG tablet TAKE 1 TABLET BY MOUTH TWICE DAILY WITH A MEAL 07/27/20  Yes Wendie Agreste, MD  methocarbamol (ROBAXIN) 500 MG tablet Take 1 tablet (500 mg total) by mouth every 6 (six) hours as needed for muscle spasms. 02/27/20  Yes Mcarthur Rossetti, MD  montelukast (SINGULAIR) 10 MG tablet Take 1 tablet (10 mg total) by mouth at bedtime. 05/30/20  Yes Maximiano Coss, NP  Multiple Vitamin (MULTIVITAMIN WITH MINERALS) TABS tablet Take 1  tablet by mouth daily.   Yes [provider]  omeprazole (PRILOSEC) 20 MG capsule Take 1 capsule (20 mg total) by mouth daily. 09/07/19  Yes Wendie Agreste, MD  ondansetron (ZOFRAN ODT) 4 MG disintegrating tablet Take 1 tablet (4 mg total) by mouth every 8 (eight) hours as needed for nausea or vomiting. 02/27/20  Yes Mcarthur Rossetti, MD  [facility] ULTRA test strip USE 1 STRIP TO CHECK GLUCOSE ONCE DAILY 11/25/18  Yes Wendie Agreste, MD  pravastatin (PRAVACHOL) 80 MG tablet Take 1 tablet (80 mg total) by mouth daily. 07/27/20  Yes Wendie Agreste, MD  sertraline (ZOLOFT) 100 MG tablet Take 2 tablets (200 mg total) by mouth daily. Start with one per day for initial 2 weeks, then increase to 2 per day. Patient taking differently: Take 100-200 mg by mouth daily. 09/07/19  Yes Wendie Agreste, MD  sodium chloride (OCEAN) 0.65 % SOLN nasal spray Place 1 spray into both nostrils as needed for congestion.   Yes [provider]  traMADol (ULTRAM) 50 MG tablet TAKE 1 TABLET BY MOUTH EVERY 6 HOURS AS NEEDED 08/16/20  Yes Wendie Agreste, MD  vitamin E 180 MG (400 UNITS) capsule Take 400  Units by mouth daily.   Yes [provider]   Social History   Socioeconomic History  . Marital status: Married    Spouse name: Not on file  . Number of children: 3  . Years of education: 48  . Highest education level: Not on file  Occupational History  . Occupation: Dealer, Company secretary  Tobacco Use  . Smoking status: Never Smoker  . Smokeless tobacco: Never Used  Vaping Use  . Vaping Use: Never used  Substance and Sexual Activity  . Alcohol use: No  . Drug use: No  . Sexual activity: Not Currently  Other Topics Concern  . Not on file  Social History Narrative  . Not on file   Social Determinants of Health   Financial Resource Strain: Not on file  Food Insecurity: Not on file  Transportation Needs: Not on file  Physical Activity: Not on file  Stress: Not on file  Social Connections: Not on file  Intimate Partner Violence: Not on file    Review of Systems  Constitutional: Negative for fatigue and unexpected weight change.  Eyes: Negative for visual disturbance.  Respiratory: Negative for cough, chest tightness and shortness of breath.   Cardiovascular: Negative for chest pain, palpitations and leg swelling (improved. ).  Gastrointestinal: Negative for abdominal pain and blood in stool.  Neurological: Negative for dizziness, light-headedness and headaches.     Objective:   Vitals:   09/02/20 0952 09/02/20 0956  BP: (!) 179/74 140/78  Pulse: 82   Temp: 97.6 F (36.4 C)   TempSrc: Temporal   SpO2: 98%   Weight: 243 lb 12.8 oz (110.6 kg)   Height: '5\' 8"'  (1.727 m)      Physical Exam Vitals reviewed.  Constitutional:      Appearance: He is well-developed and well-nourished.  HENT:     Head: Normocephalic and atraumatic.  Eyes:     Extraocular Movements: EOM normal.     Pupils: Pupils are equal, round, and reactive to light.  Neck:     Vascular: No carotid bruit or JVD.  Cardiovascular:     Rate and Rhythm: Normal rate and regular  rhythm.     Heart sounds: Normal heart sounds. No murmur heard.   Pulmonary:  Effort: Pulmonary effort is normal.     Breath sounds: Normal breath sounds. No rales.  Musculoskeletal:     Right lower leg: Edema (Trace to 1+ bilaterally, lower half tibia.) present.     Left lower leg: Edema present.  Skin:    General: Skin is warm and dry.  Neurological:     Mental Status: He is alert and oriented to person, place, and time.  Psychiatric:        Mood and Affect: Mood and affect normal.        Assessment & Plan:  Daniel [name] is a 70 y.o. male . Essential hypertension - Plan: Basic metabolic panel  Type 2 diabetes mellitus with diabetic polyneuropathy, without long-term current use of insulin ([place]) - Plan: Basic metabolic panel  Commended on dietary changes, anticipate improvement in not only blood sugar but blood pressure, and further improvement should occur as he starts to do more outside work, low intensity activity/exercise.  Tolerating new dose of HCTZ, consider higher dosing if persistent elevated systolics.  Handout given.  Continue to avoid sodium.  Recheck in 2 months.  Current management doing okay for knee pain, will continue same until he is in the position to consider surgery.  No orders of the defined types were placed in this encounter.  Patient Instructions   Keep up the good work with diet changes. Low intensity exercise/activity should also help blood sugar and blood pressure.  Goal blood pressure is less than 130/80.  If she consistently has readings up in the 140s 150s, let me know and we can try a higher dose of the hydrochlorothiazide.  No changes for now.  Recheck in 2 months for diabetes.  Let me know if there are questions sooner and take care.    Managing Your Hypertension Hypertension, also called high blood pressure, is when the force of the blood pressing against the walls of the arteries is too strong. Arteries are blood vessels that carry  blood from your heart throughout your body. Hypertension forces the heart to work harder to pump blood and may cause the arteries to become narrow or stiff. Understanding blood pressure readings Your personal target blood pressure may vary depending on your medical conditions, your age, and other factors. A blood pressure reading includes a higher number over a lower number. Ideally, your blood pressure should be below 120/80. You should know that:  The first, or top, number is called the systolic pressure. It is a measure of the pressure in your arteries as your heart beats.  The second, or bottom number, is called the diastolic pressure. It is a measure of the pressure in your arteries as the heart relaxes. Blood pressure is classified into four stages. Based on your blood pressure reading, your health care provider may use the following stages to determine what type of treatment you need, if any. Systolic pressure and diastolic pressure are measured in a unit called mmHg. Normal  Systolic pressure: below 478.  Diastolic pressure: below 80. Elevated  Systolic pressure: 295-621.  Diastolic pressure: below 80. Hypertension stage 1  Systolic pressure: 308-657.  Diastolic pressure: 84-69. Hypertension stage 2  Systolic pressure: 629 or above.  Diastolic pressure: 90 or above. How can this condition affect me? Managing your hypertension is an important responsibility. Over time, hypertension can damage the arteries and decrease blood flow to important parts of the body, including the brain, heart, and kidneys. Having untreated or uncontrolled hypertension can lead to:  A heart  attack.  A stroke.  A weakened blood vessel (aneurysm).  Heart failure.  Kidney damage.  Eye damage.  Metabolic syndrome.  Memory and concentration problems.  Vascular dementia. What actions can I take to manage this condition? Hypertension can be managed by making lifestyle changes and possibly by  taking medicines. Your health care provider will help you make a plan to bring your blood pressure within a normal range. Nutrition  Eat a diet that is high in fiber and potassium, and low in salt (sodium), added sugar, and fat. An example eating plan is called the Dietary Approaches to Stop Hypertension (DASH) diet. To eat this way: ? Eat plenty of fresh fruits and vegetables. Try to fill one-half of your plate at each meal with fruits and vegetables. ? Eat whole grains, such as whole-wheat pasta, brown rice, or whole-grain bread. Fill about one-fourth of your plate with whole grains. ? Eat low-fat dairy products. ? Avoid fatty cuts of meat, processed or cured meats, and poultry with skin. Fill about one-fourth of your plate with lean proteins such as fish, chicken without skin, beans, eggs, and tofu. ? Avoid pre-made and processed foods. These tend to be higher in sodium, added sugar, and fat.  Reduce your daily sodium intake. Most people with hypertension should eat less than 1,500 mg of sodium a day.   Lifestyle  Work with your health care provider to maintain a healthy body weight or to lose weight. Ask what an ideal weight is for you.  Get at least 30 minutes of exercise that causes your heart to beat faster (aerobic exercise) most days of the week. Activities may include walking, swimming, or biking.  Include exercise to strengthen your muscles (resistance exercise), such as weight lifting, as part of your weekly exercise routine. Try to do these types of exercises for 30 minutes at least 3 days a week.  Do not use any products that contain nicotine or tobacco, such as cigarettes, e-cigarettes, and chewing tobacco. If you need help quitting, ask your health care provider.  Control any long-term (chronic) conditions you have, such as high cholesterol or diabetes.  Identify your sources of stress and find ways to manage stress. This may include meditation, deep breathing, or making time  for fun activities.   Alcohol use  Do not drink alcohol if: ? Your health care provider tells you not to drink. ? You are pregnant, may be pregnant, or are planning to become pregnant.  If you drink alcohol: ? Limit how much you use to:  0-1 drink a day for women.  0-2 drinks a day for men. ? Be aware of how much alcohol is in your drink. In the [place] one drink equals one 12 oz bottle of beer (355 mL), one 5 oz glass of wine (148 mL), or one 1 oz glass of hard liquor (44 mL). Medicines Your health care provider may prescribe medicine if lifestyle changes are not enough to get your blood pressure under control and if:  Your systolic blood pressure is 130 or higher.  Your diastolic blood pressure is 80 or higher. Take medicines only as told by your health care provider. Follow the directions carefully. Blood pressure medicines must be taken as told by your health care provider. The medicine does not work as well when you skip doses. Skipping doses also puts you at risk for problems. Monitoring Before you monitor your blood pressure:  Do not smoke, drink caffeinated beverages, or exercise within 30 minutes before  taking a measurement.  Use the bathroom and empty your bladder (urinate).  Sit quietly for at least 5 minutes before taking measurements. Monitor your blood pressure at home as told by your health care provider. To do this:  Sit with your back straight and supported.  Place your feet flat on the floor. Do not cross your legs.  Support your arm on a flat surface, such as a table. Make sure your upper arm is at heart level.  Each time you measure, take two or three readings one minute apart and record the results. You may also need to have your blood pressure checked regularly by your health care provider.   General information  Talk with your health care provider about your diet, exercise habits, and other lifestyle factors that may be contributing to  hypertension.  Review all the medicines you take with your health care provider because there may be side effects or interactions.  Keep all visits as told by your health care provider. Your health care provider can help you create and adjust your plan for managing your high blood pressure. Where to find more information  National Heart, Lung, and Blood Institute: https://wilson-eaton.com/  American Heart Association: www.heart.org Contact a health care provider if:  You think you are having a reaction to medicines you have taken.  You have repeated (recurrent) headaches.  You feel dizzy.  You have swelling in your ankles.  You have trouble with your vision. Get help right away if:  You develop a severe headache or confusion.  You have unusual weakness or numbness, or you feel faint.  You have severe pain in your chest or abdomen.  You vomit repeatedly.  You have trouble breathing. These symptoms may represent a serious problem that is an emergency. Do not wait to see if the symptoms will go away. Get medical help right away. Call your local emergency services (911 in the [place] Do not drive yourself to the hospital. Summary  Hypertension is when the force of blood pumping through your arteries is too strong. If this condition is not controlled, it may put you at risk for serious complications.  Your personal target blood pressure may vary depending on your medical conditions, your age, and other factors. For most people, a normal blood pressure is less than 120/80.  Hypertension is managed by lifestyle changes, medicines, or both.  Lifestyle changes to help manage hypertension include losing weight, eating a healthy, low-sodium diet, exercising more, stopping smoking, and limiting alcohol. This information is not intended to replace advice given to you by your health care provider. Make sure you discuss any questions you have with your health care provider. Document Revised:  07/24/2019 Document Reviewed: 05/19/2019 Elsevier Patient Education  2021 [employer].    If you have lab work done today you will be contacted with your lab results within the next 2 weeks.  If you have not heard from [place] then please contact us. The fastest way to get your results is to register for My Chart.   IF you received an x-ray today, you will receive an invoice from [facility]. Please contact [facility] at [phone] with questions or concerns regarding your invoice.   IF you received labwork today, you will receive an invoice from [facility]. Please contact [facility] at [phone] with questions or concerns regarding your invoice.   Our billing staff will not be able to assist you with questions regarding bills from these companies.  You will be contacted with  the lab results as soon as they are available. The fastest way to get your results is to activate your My Chart account. Instructions are located on the last page of this paperwork. If you have not heard from [place] regarding the results in 2 weeks, please contact this office.      Managing Your Hypertension Hypertension, also called high blood pressure, is when the force of the blood pressing against the walls of the arteries is too strong. Arteries are blood vessels that carry blood from your heart throughout your body. Hypertension forces the heart to work harder to pump blood and may cause the arteries to become narrow or stiff. Understanding blood pressure readings Your personal target blood pressure may vary depending on your medical conditions, your age, and other factors. A blood pressure reading includes a higher number over a lower number. Ideally, your blood pressure should be below 120/80. You should know that:  The first, or top, number is called the systolic pressure. It is a measure of the pressure in your arteries as your heart beats.  The second, or bottom number, is called the diastolic  pressure. It is a measure of the pressure in your arteries as the heart relaxes. Blood pressure is classified into four stages. Based on your blood pressure reading, your health care provider may use the following stages to determine what type of treatment you need, if any. Systolic pressure and diastolic pressure are measured in a unit called mmHg. Normal  Systolic pressure: below 919.  Diastolic pressure: below 80. Elevated  Systolic pressure: 166-060.  Diastolic pressure: below 80. Hypertension stage 1  Systolic pressure: 045-997.  Diastolic pressure: 74-14. Hypertension stage 2  Systolic pressure: 239 or above.  Diastolic pressure: 90 or above. How can this condition affect me? Managing your hypertension is an important responsibility. Over time, hypertension can damage the arteries and decrease blood flow to important parts of the body, including the brain, heart, and kidneys. Having untreated or uncontrolled hypertension can lead to:  A heart attack.  A stroke.  A weakened blood vessel (aneurysm).  Heart failure.  Kidney damage.  Eye damage.  Metabolic syndrome.  Memory and concentration problems.  Vascular dementia. What actions can I take to manage this condition? Hypertension can be managed by making lifestyle changes and possibly by taking medicines. Your health care provider will help you make a plan to bring your blood pressure within a normal range. Nutrition  Eat a diet that is high in fiber and potassium, and low in salt (sodium), added sugar, and fat. An example eating plan is called the Dietary Approaches to Stop Hypertension (DASH) diet. To eat this way: ? Eat plenty of fresh fruits and vegetables. Try to fill one-half of your plate at each meal with fruits and vegetables. ? Eat whole grains, such as whole-wheat pasta, brown rice, or whole-grain bread. Fill about one-fourth of your plate with whole grains. ? Eat low-fat dairy products. ? Avoid fatty  cuts of meat, processed or cured meats, and poultry with skin. Fill about one-fourth of your plate with lean proteins such as fish, chicken without skin, beans, eggs, and tofu. ? Avoid pre-made and processed foods. These tend to be higher in sodium, added sugar, and fat.  Reduce your daily sodium intake. Most people with hypertension should eat less than 1,500 mg of sodium a day.   Lifestyle  Work with your health care provider to maintain a healthy body weight or to lose weight. Ask what  an ideal weight is for you.  Get at least 30 minutes of exercise that causes your heart to beat faster (aerobic exercise) most days of the week. Activities may include walking, swimming, or biking.  Include exercise to strengthen your muscles (resistance exercise), such as weight lifting, as part of your weekly exercise routine. Try to do these types of exercises for 30 minutes at least 3 days a week.  Do not use any products that contain nicotine or tobacco, such as cigarettes, e-cigarettes, and chewing tobacco. If you need help quitting, ask your health care provider.  Control any long-term (chronic) conditions you have, such as high cholesterol or diabetes.  Identify your sources of stress and find ways to manage stress. This may include meditation, deep breathing, or making time for fun activities.   Alcohol use  Do not drink alcohol if: ? Your health care provider tells you not to drink. ? You are pregnant, may be pregnant, or are planning to become pregnant.  If you drink alcohol: ? Limit how much you use to:  0-1 drink a day for women.  0-2 drinks a day for men. ? Be aware of how much alcohol is in your drink. In the [place] one drink equals one 12 oz bottle of beer (355 mL), one 5 oz glass of wine (148 mL), or one 1 oz glass of hard liquor (44 mL). Medicines Your health care provider may prescribe medicine if lifestyle changes are not enough to get your blood pressure under control and  if:  Your systolic blood pressure is 130 or higher.  Your diastolic blood pressure is 80 or higher. Take medicines only as told by your health care provider. Follow the directions carefully. Blood pressure medicines must be taken as told by your health care provider. The medicine does not work as well when you skip doses. Skipping doses also puts you at risk for problems. Monitoring Before you monitor your blood pressure:  Do not smoke, drink caffeinated beverages, or exercise within 30 minutes before taking a measurement.  Use the bathroom and empty your bladder (urinate).  Sit quietly for at least 5 minutes before taking measurements. Monitor your blood pressure at home as told by your health care provider. To do this:  Sit with your back straight and supported.  Place your feet flat on the floor. Do not cross your legs.  Support your arm on a flat surface, such as a table. Make sure your upper arm is at heart level.  Each time you measure, take two or three readings one minute apart and record the results. You may also need to have your blood pressure checked regularly by your health care provider.   General information  Talk with your health care provider about your diet, exercise habits, and other lifestyle factors that may be contributing to hypertension.  Review all the medicines you take with your health care provider because there may be side effects or interactions.  Keep all visits as told by your health care provider. Your health care provider can help you create and adjust your plan for managing your high blood pressure. Where to find more information  National Heart, Lung, and Blood Institute: https://wilson-eaton.com/  American Heart Association: www.heart.org Contact a health care provider if:  You think you are having a reaction to medicines you have taken.  You have repeated (recurrent) headaches.  You feel dizzy.  You have swelling in your ankles.  You have  trouble with your vision. Get  help right away if:  You develop a severe headache or confusion.  You have unusual weakness or numbness, or you feel faint.  You have severe pain in your chest or abdomen.  You vomit repeatedly.  You have trouble breathing. These symptoms may represent a serious problem that is an emergency. Do not wait to see if the symptoms will go away. Get medical help right away. Call your local emergency services (911 in the [place] Do not drive yourself to the hospital. Summary  Hypertension is when the force of blood pumping through your arteries is too strong. If this condition is not controlled, it may put you at risk for serious complications.  Your personal target blood pressure may vary depending on your medical conditions, your age, and other factors. For most people, a normal blood pressure is less than 120/80.  Hypertension is managed by lifestyle changes, medicines, or both.  Lifestyle changes to help manage hypertension include losing weight, eating a healthy, low-sodium diet, exercising more, stopping smoking, and limiting alcohol. This information is not intended to replace advice given to you by your health care provider. Make sure you discuss any questions you have with your health care provider. Document Revised: 07/24/2019 Document Reviewed: 05/19/2019 Elsevier Patient Education  2021 [place].      Signed, Merri Ray, MD [facility]

## 2020-09-15 ENCOUNTER — Other Ambulatory Visit: Payer: Self-pay | Admitting: Family Medicine

## 2020-09-15 DIAGNOSIS — K219 Gastro-esophageal reflux disease without esophagitis: Secondary | ICD-10-CM

## 2020-09-15 DIAGNOSIS — F32A Depression, unspecified: Secondary | ICD-10-CM

## 2020-09-15 NOTE — Telephone Encounter (Signed)
Requested Prescriptions  Pending Prescriptions Disp Refills  . sertraline (ZOLOFT) 100 MG tablet [Pharmacy Med Name: Sertraline HCl 100 MG Oral Tablet] 180 tablet 0    Sig: START WITH 1 TABLET BY MOUTH ONCE DAILY FOR 2 WEEKS, THEN INCREASE TO 2 TABLET DAILY.     Psychiatry:  Antidepressants - SSRI Passed - 09/15/2020  8:57 AM      Passed - Completed PHQ-2 or PHQ-9 in the last 360 days      Passed - Valid encounter within last 6 months    Recent Outpatient Visits          1 week ago Essential hypertension   [facility], Ranell Patrick, MD   1 month ago Essential hypertension   [facility], Ranell Patrick, MD   3 months ago Essential hypertension   [facility], [place], NP   4 months ago Type 2 diabetes mellitus with diabetic polyneuropathy, without long-term current use of insulin [facility])   [facility], Ranell Patrick, MD   5 months ago Lower respiratory infection   [facility], [place], NP             . omeprazole (PRILOSEC) 20 MG capsule [Pharmacy Med Name: Omeprazole 20 MG Oral Capsule Delayed Release] 90 capsule 0    Sig: Take 1 capsule by mouth once daily     Gastroenterology: Proton Pump Inhibitors Passed - 09/15/2020  8:57 AM      Passed - Valid encounter within last 12 months    Recent Outpatient Visits          1 week ago Essential hypertension   [facility], Ranell Patrick, MD   1 month ago Essential hypertension   [facility], Ranell Patrick, MD   3 months ago Essential hypertension   [facility], [place], NP   4 months ago Type 2 diabetes mellitus with diabetic polyneuropathy, without long-term current use of insulin [facility])   [facility], Ranell Patrick, MD   5 months ago Lower respiratory infection   [facility], [place], NP

## 2020-09-27 ENCOUNTER — Other Ambulatory Visit: Payer: Self-pay | Admitting: Family Medicine

## 2020-09-27 DIAGNOSIS — G8929 Other chronic pain: Secondary | ICD-10-CM

## 2020-09-28 ENCOUNTER — Other Ambulatory Visit: Payer: Self-pay

## 2020-09-28 DIAGNOSIS — G8929 Other chronic pain: Secondary | ICD-10-CM

## 2020-09-28 DIAGNOSIS — M25562 Pain in left knee: Secondary | ICD-10-CM

## 2020-09-28 MED ORDER — TRAMADOL HCL 50 MG PO TABS: 50.0000 mg | ORAL_TABLET | Freq: Four times a day (QID) | ORAL | 0 refills | Status: DC | PRN

## 2020-09-28 NOTE — Telephone Encounter (Signed)
Patient is requesting a refill of the following medications: Requested Prescriptions   Pending Prescriptions Disp Refills  . traMADol (ULTRAM) 50 MG tablet 120 tablet 0    Sig: Take 1 tablet (50 mg total) by mouth every 6 (six) hours as needed.    Date of patient request: 09/28/2020 Last office visit: 09/02/2020 Date of last refill: 08/16/2020 Last refill amount: 120 Follow up time period per chart: 2 months

## 2020-09-28 NOTE — Telephone Encounter (Signed)
Controlled substance database (PDMP) reviewed. No concerns appreciated.   Last filled 08/17/20. Refill ordered.

## 2020-10-09 ENCOUNTER — Other Ambulatory Visit: Payer: Self-pay | Admitting: Family Medicine

## 2020-10-09 DIAGNOSIS — I10 Essential (primary) hypertension: Secondary | ICD-10-CM

## 2020-10-18 ENCOUNTER — Encounter: Payer: Self-pay | Admitting: Registered Nurse

## 2020-10-18 ENCOUNTER — Ambulatory Visit (INDEPENDENT_AMBULATORY_CARE_PROVIDER_SITE_OTHER): Payer: Medicare Other | Admitting: Registered Nurse

## 2020-10-18 ENCOUNTER — Other Ambulatory Visit: Payer: Self-pay

## 2020-10-18 VITALS — BP 144/74 | HR 68 | Temp 98.1°F | Resp 16 | Ht 68.0 in | Wt 245.6 lb

## 2020-10-18 DIAGNOSIS — E1142 Type 2 diabetes mellitus with diabetic polyneuropathy: Secondary | ICD-10-CM

## 2020-10-18 DIAGNOSIS — S46812A Strain of other muscles, fascia and tendons at shoulder and upper arm level, left arm, initial encounter: Secondary | ICD-10-CM

## 2020-10-18 MED ORDER — GLIPIZIDE 5 MG PO TABS: 2.5000 mg | ORAL_TABLET | Freq: Every day | ORAL | 0 refills | Status: DC

## 2020-10-18 MED ORDER — PREDNISONE 10 MG PO TABS: 10.0000 mg | ORAL_TABLET | Freq: Every day | ORAL | 0 refills | Status: DC

## 2020-10-18 MED ORDER — METHOCARBAMOL 500 MG PO TABS: 500.0000 mg | ORAL_TABLET | Freq: Four times a day (QID) | ORAL | 1 refills | Status: DC | PRN

## 2020-10-18 NOTE — Progress Notes (Signed)
Acute Office Visit  Subjective:    Patient ID: Daniel [name], male    DOB: [birth year], 70 y.o.   MRN: [id number]  Chief Complaint  Patient presents with  . Neck Pain    Pt reports some shoulder neck pain for 3 days, notes both rotator cuffs are damaged at this time but this feels different     HPI Patient is in today for L neck/shoulder pain  Ongoing 3 days. No acute injury noted. Knows he has poor rotator cuffs but this feels different Aching, pulling pain between shoulder and neck on L side No pain distinctly in shoulder joint or along spine "can't find a comfortable position" Has been taking methocarbamol and oxycodone since pain has been severe  Admits he has been less active over past few weeks as he helps his wife recover from joint replacement surgery, otherwise would be doing more around the house and yard.  Otherwise notes that he unfortunately left his Victoza out for a week while away - discarded this as instructed per package. Sugars have been higher since. Awaiting next refill. Wants to know if there's anything to do to keep sugars down in the interim.  Past Medical History:  Diagnosis Date  . Allergic rhinitis   . Allergy   . Anxiety   . Arthritis   . Benign hypertension   . Cataract   . Depression   . Diabetes mellitus ([place])    type 2   . GERD (gastroesophageal reflux disease)   . History of pneumonia   . HLD (hyperlipidemia)   . Neuromuscular disorder ([place])    pt denies at 8/19/visit   . Pneumonia    hx of several times per pt - last episode 4-5 years ago     Past Surgical History:  Procedure Laterality Date  . CARPAL TUNNEL RELEASE Bilateral 2020 Right, 2021 left  . CATARACT EXTRACTION     bilateral  . EYE SURGERY    . KNEE SURGERY     right  . 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 Diabetes Father   . Colon polyps Father   . Atrial fibrillation Mother   . Prostate cancer Paternal Uncle   . Cancer Sister   . Esophageal cancer Neg Hx   . Rectal cancer Neg Hx   . Stomach cancer Neg Hx     Social History   Socioeconomic History  . Marital status: Married    Spouse name: Not on file  . Number of children: 3  . Years of education: 30  . Highest education level: Not on file  Occupational History  . Occupation: Dealer, Company secretary  Tobacco Use  . Smoking status: Never Smoker  . Smokeless tobacco: Never Used  Vaping Use  . Vaping Use: Never used  Substance and Sexual Activity  . Alcohol use: No  . Drug use: No  . Sexual activity: Not Currently  Other Topics Concern  . Not on file  Social History Narrative  . Not on file   Social Determinants of Health   Financial Resource Strain: Not on file  Food Insecurity: Not on file  Transportation Needs: Not on file  Physical Activity: Not on file  Stress: Not on file  Social Connections: Not on file  Intimate Partner Violence: Not  on file    Outpatient Medications Prior to Visit  Medication Sig Dispense Refill  . aspirin EC 325 MG EC tablet Take 1 tablet (325 mg total) by mouth 2 (two) times daily after a meal. (Patient taking differently: Take 81 mg by mouth 2 (two) times daily after a meal.) 30 tablet 0  . blood glucose meter kit and supplies 1 each by Other route as directed. Dispense based on patient and insurance preference. Check once per day (variable times).  Accu check. 1 each 0  . cetirizine (ZYRTEC) 10 MG tablet Take 10 mg by mouth daily as needed for allergies.     . Cholecalciferol (VITAMIN D) 125 MCG (5000 UT) CAPS Take 5,000 Units by mouth daily.    . Cyanocobalamin (VITAMIN B 12 PO) Take 1,000 mg by mouth daily.     . diclofenac Sodium (VOLTAREN) 1 % GEL Apply 2 g topically daily.    . fluticasone (FLONASE) 50 MCG/ACT nasal spray Place 1 spray  into both nostrils daily.    Marland Kitchen glimepiride (AMARYL) 4 MG tablet Take 1 tablet (4 mg total) by mouth 2 (two) times daily. 180 tablet 1  . hydrochlorothiazide (MICROZIDE) 12.5 MG capsule Take 1 capsule by mouth once daily 90 capsule 1  . ibuprofen (ADVIL,MOTRIN) 200 MG tablet Take 400 mg by mouth in the morning and at bedtime.     . Lancets (ONETOUCH DELICA PLUS [id number]) MISC USE 1 TO CHECK GLUCOSE ONCE DAILY    . liraglutide (VICTOZA) 18 MG/3ML SOPN Inject 1.8 mg into the skin daily. I 3 mL 5  . lisinopril (ZESTRIL) 20 MG tablet Take 1 tablet (20 mg total) by mouth daily. 90 tablet 3  . metFORMIN (GLUCOPHAGE) 1000 MG tablet TAKE 1 TABLET BY MOUTH TWICE DAILY WITH A MEAL 180 tablet 1  . montelukast (SINGULAIR) 10 MG tablet Take 1 tablet (10 mg total) by mouth at bedtime. 90 tablet 1  . Multiple Vitamin (MULTIVITAMIN WITH MINERALS) TABS tablet Take 1 tablet by mouth daily.    Marland Kitchen omeprazole (PRILOSEC) 20 MG capsule Take 1 capsule by mouth once daily 90 capsule 0  . ondansetron (ZOFRAN ODT) 4 MG disintegrating tablet Take 1 tablet (4 mg total) by mouth every 8 (eight) hours as needed for nausea or vomiting. 20 tablet 0  . ONETOUCH ULTRA test strip USE 1 STRIP TO CHECK GLUCOSE ONCE DAILY 100 each 1  . pravastatin (PRAVACHOL) 80 MG tablet Take 1 tablet (80 mg total) by mouth daily. 90 tablet 1  . sertraline (ZOLOFT) 100 MG tablet START WITH 1 TABLET BY MOUTH ONCE DAILY FOR 2 WEEKS, THEN INCREASE TO 2 TABLET DAILY. 180 tablet 0  . sodium chloride (OCEAN) 0.65 % SOLN nasal spray Place 1 spray into both nostrils as needed for congestion.    . traMADol (ULTRAM) 50 MG tablet Take 1 tablet (50 mg total) by mouth every 6 (six) hours as needed. 120 tablet 0  . vitamin E 180 MG (400 UNITS) capsule Take 400 Units by mouth daily.    . methocarbamol (ROBAXIN) 500 MG tablet Take 1 tablet (500 mg total) by mouth every 6 (six) hours as needed for muscle spasms. 60 tablet 1  . gabapentin (NEURONTIN) 300 MG capsule Take  1-2 capsules (300-600 mg total) by mouth 2 (two) times daily as needed. (Patient not taking: Reported on 10/18/2020) 180 capsule 3   No facility-administered medications prior to visit.    Allergies  Allergen Reactions  . Actos [Pioglitazone] Other (See  Comments), Shortness Of Breath and Swelling    Blood sugar went up and down and sweating profusely  . Codeine Shortness Of Breath and Palpitations    Review of Systems Per hpi      Objective:    Physical Exam Vitals and nursing note reviewed.  Constitutional:      Appearance: Normal appearance.  Cardiovascular:     Rate and Rhythm: Normal rate and regular rhythm.     Pulses: Normal pulses.     Heart sounds: Normal heart sounds. No murmur heard. No friction rub. No gallop.   Pulmonary:     Effort: Pulmonary effort is normal. No respiratory distress.     Breath sounds: Normal breath sounds. No stridor. No wheezing, rhonchi or rales.  Musculoskeletal:        General: Tenderness present. No swelling, deformity or signs of injury. Normal range of motion.       Back:     Right lower leg: No edema.     Left lower leg: No edema.  Neurological:     General: No focal deficit present.     Mental Status: He is alert. Mental status is at baseline.  Psychiatric:        Mood and Affect: Mood normal.        Behavior: Behavior normal.        Thought Content: Thought content normal.        Judgment: Judgment normal.     BP (!) 144/74   Pulse 68   Temp 98.1 F (36.7 C) (Temporal)   Resp 16   Ht '5\' 8"'  (1.727 m)   Wt 245 lb 9.6 oz (111.4 kg)   SpO2 96%   BMI 37.34 kg/m  Wt Readings from Last 3 Encounters:  10/18/20 245 lb 9.6 oz (111.4 kg)  09/02/20 243 lb 12.8 oz (110.6 kg)  07/27/20 243 lb (110.2 kg)    Health Maintenance Due  Topic Date Due  . COVID-19 Vaccine (3 - Pfizer risk 4-dose series) 10/19/2019    There are no preventive care reminders to display for this patient.   Lab Results  Component Value Date   TSH  2.170 04/08/2017   Lab Results  Component Value Date   WBC 8.6 05/30/2020   HGB 11.5 (L) 05/30/2020   HCT 35.3 (L) 05/30/2020   MCV 87 05/30/2020   PLT 373 05/30/2020   Lab Results  Component Value Date   NA 136 09/02/2020   K 5.5 (H) 09/02/2020   CO2 19 (L) 09/02/2020   GLUCOSE 167 (H) 09/02/2020   BUN 32 (H) 09/02/2020   CREATININE 1.17 09/02/2020   BILITOT <0.2 07/27/2020   ALKPHOS 92 07/27/2020   AST 15 07/27/2020   ALT 14 07/27/2020   PROT 6.8 07/27/2020   ALBUMIN 4.1 07/27/2020   CALCIUM 9.4 09/02/2020   ANIONGAP 11 02/27/2020   Lab Results  Component Value Date   CHOL 241 (H) 07/27/2020   Lab Results  Component Value Date   HDL 57 07/27/2020   Lab Results  Component Value Date   LDLCALC 153 (H) 07/27/2020   Lab Results  Component Value Date   TRIG 174 (H) 07/27/2020   Lab Results  Component Value Date   CHOLHDL 4.2 07/27/2020   Lab Results  Component Value Date   HGBA1C 8.7 (H) 07/27/2020       Assessment & Plan:   Problem List Items Addressed This Visit      Endocrine   Type 2  diabetes mellitus with diabetic polyneuropathy, without long-term current use of insulin (HCC)   Relevant Medications   glipiZIDE (GLUCOTROL) 5 MG tablet   methocarbamol (ROBAXIN) 500 MG tablet    Other Visit Diagnoses    Strain of left trapezius muscle, initial encounter    -  Primary   Relevant Medications   predniSONE (DELTASONE) 10 MG tablet   methocarbamol (ROBAXIN) 500 MG tablet       Meds ordered this encounter  Medications  . predniSONE (DELTASONE) 10 MG tablet    Sig: Take 1 tablet (10 mg total) by mouth daily with breakfast.    Dispense:  5 tablet    Refill:  0    Order Specific Question:   Supervising Provider    Answer:   Carlota Raspberry, JEFFREY R [[id number]]  . glipiZIDE (GLUCOTROL) 5 MG tablet    Sig: Take 0.5 tablets (2.5 mg total) by mouth daily before breakfast. Take an additional 0.5 tablets (2.5 mg total) by mouth before dinner if blood glucose is  above 200. Call clinic if blood glucose above 250.    Dispense:  30 tablet    Refill:  0    Order Specific Question:   Supervising Provider    Answer:   Carlota Raspberry, JEFFREY R [[id number]]  . methocarbamol (ROBAXIN) 500 MG tablet    Sig: Take 1 tablet (500 mg total) by mouth every 6 (six) hours as needed for muscle spasms.    Dispense:  60 tablet    Refill:  1    Order Specific Question:   Supervising Provider    Answer:   Carlota Raspberry, JEFFREY R [[id number]]   PLAN  Appears as msk etiology. Likely trapezius strain, could result from tight pectoral muscle given his recent inactivity.  Will have him start taking robaxin regularly. Short, low dose prednisone burst. Reviewed ROM stretching through neck, back, and chest.  Will start low does glipizide once daily. Continue to monitor sugars. Increase glipizide to twice daily if sugars elevated above 200. Discussed r/b/se of this medication with him  He will return to see Dr. Carlota Raspberry in 2-3 weeks.  Patient encouraged to call clinic with any questions, comments, or concerns.   Maximiano Coss, NP

## 2020-10-18 NOTE — Patient Instructions (Signed)
Mr. Swor -    Doristine Devoid to see you again.  Start taking the methocarbamol regularly 2-3 times daily. Range of motion stretching through the neck Stretch in the doorway by walking through it while catching your hands on the frame in push up position Prednisone 10mg  by mouth in the morning for 5 days Glipizide 2.5 mg in the mornings at least through course of prednisone. May increase this to twice daily if sugars are rising too much.  Check home blood pressure as well.   Let me know if blood pressure in the mornings is above 140/90 or if sugars rise above 250. Let me know if you have further concerns.   Thank you  Rich

## 2020-10-24 ENCOUNTER — Encounter: Payer: Self-pay | Admitting: Registered Nurse

## 2020-10-26 ENCOUNTER — Other Ambulatory Visit: Payer: Self-pay | Admitting: Registered Nurse

## 2020-10-26 DIAGNOSIS — M25562 Pain in left knee: Secondary | ICD-10-CM

## 2020-10-26 DIAGNOSIS — G8929 Other chronic pain: Secondary | ICD-10-CM

## 2020-10-26 DIAGNOSIS — J4520 Mild intermittent asthma, uncomplicated: Secondary | ICD-10-CM

## 2020-10-26 DIAGNOSIS — M542 Cervicalgia: Secondary | ICD-10-CM

## 2020-10-26 MED ORDER — TRAMADOL HCL 50 MG PO TABS: 50.0000 mg | ORAL_TABLET | Freq: Four times a day (QID) | ORAL | 0 refills | Status: DC | PRN

## 2020-10-26 MED ORDER — ALBUTEROL SULFATE HFA 108 (90 BASE) MCG/ACT IN AERS: 2.0000 | INHALATION_SPRAY | Freq: Four times a day (QID) | RESPIRATORY_TRACT | 0 refills | Status: DC | PRN

## 2020-10-26 MED ORDER — CYCLOBENZAPRINE HCL 10 MG PO TABS: 10.0000 mg | ORAL_TABLET | Freq: Three times a day (TID) | ORAL | 0 refills | Status: DC | PRN

## 2020-10-31 ENCOUNTER — Telehealth: Payer: Self-pay

## 2020-10-31 DIAGNOSIS — Z9189 Other specified personal risk factors, not elsewhere classified: Secondary | ICD-10-CM

## 2020-10-31 NOTE — Progress Notes (Signed)
[facility])                                            [place] Team                                        Statin Quality Measure Assessment    10/31/2020  GIOVONI BUNCH 02/17/1951 [id number]  Per review of chart and payor information, this patient has been flagged for non-adherence to the following CMS Quality Measure:   [x]  Statin Use in Persons with Diabetes  []  Statin Use in Persons with Cardiovascular Disease  The 10-year ASCVD risk score [name] DC Brooke Bonito., et al., 2013) is: 46.3%   Values used to calculate the score:     Age: 31 years     Sex: Male     Is Non-Hispanic African American: No     Diabetic: Yes     Tobacco smoker: No     Systolic Blood Pressure: 580 mmHg     Is BP treated: Yes     HDL Cholesterol: 57 mg/dL     Total Cholesterol: 241 mg/dL   Currently prescribed statin:  [x]  Yes []  No     Comments:Pravastatin 80 mg daily.    Per pharmacy claims, patient last filled pravastatin 80 mg  on 12/20/2019. I was unable to reach patient to discuss medication compliance/fill history. Please consider discussing with patient medication compliance at the next office visit.  Thank you for your time,  Kristeen Miss, [place] Cell: [phone]

## 2020-11-10 ENCOUNTER — Encounter: Payer: Self-pay | Admitting: Family Medicine

## 2020-11-10 ENCOUNTER — Other Ambulatory Visit: Payer: Self-pay

## 2020-11-10 ENCOUNTER — Ambulatory Visit (INDEPENDENT_AMBULATORY_CARE_PROVIDER_SITE_OTHER): Payer: Medicare Other | Admitting: Family Medicine

## 2020-11-10 VITALS — BP 134/66 | HR 83 | Temp 98.0°F | Resp 16 | Ht 68.0 in | Wt 241.6 lb

## 2020-11-10 DIAGNOSIS — M62838 Other muscle spasm: Secondary | ICD-10-CM

## 2020-11-10 DIAGNOSIS — M542 Cervicalgia: Secondary | ICD-10-CM

## 2020-11-10 DIAGNOSIS — E1142 Type 2 diabetes mellitus with diabetic polyneuropathy: Secondary | ICD-10-CM | POA: Diagnosis not present

## 2020-11-10 NOTE — Progress Notes (Signed)
Subjective:  Patient ID: CARLEE TESFAYE, male    DOB: 02-Jan-1951  Age: 70 y.o. MRN: [id number]  CC:  Chief Complaint  Patient presents with  . Neck Pain    Pt is still having the pain though it has improved some pt did not have X ray done as ordered but reports he was unaware until this morning     HPI LOMAN LOGAN presents for   Neck pain: Evaluated April 19 with Kathrin Ruddy.  Thought to be musculoskeletal cause, treated with prednisone, methocarbamol. C spine X-ray ordered, not yet performed.   Pain initially started 2 weeks prior to visit with [facility]. NKI, but may have had some soreness after using riding mower, went into hole, had to turn wheel quickly. May have turned neck. Sore few days later.  Since last visit - neck pain has improved, but still some soreness. Both sides - R side of the neck, left side of neck to shoulder, not down arm.  No arm weakness.  Neck not limiting function.  Prednisone completed - slight improvement.  Robaxin at night - helps. Still on chronic tramadol.  Tens unit to area has helped.   At end of visit - concern about blood sugar -  Left Victoza when went out of town. Used glipizide form [facility] in place of Victoza as not refrigerated. Back on Victoza now. Some erratic readings (168, 124, 135, 180). Starting to improve. Currently taking victoza again.  Lab Results  Component Value Date   HGBA1C 8.7 (H) 07/27/2020  taking metformin 1069m BID, amaryl 435mBID, victoza 1.8 mg QD for past 3 weeks. Was taking glipizide and glimepiride, now only glimepiride.  Recent readings - am 130-140, postprandial 170-180. Forgot meds last night - had 200 this am. Not usually having 200's.   History Patient Active Problem List   Diagnosis Date Noted  . 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  . Trigger finger, left little finger 02/04/2020  . Carpal tunnel syndrome, left upper limb 11/24/2019  . Unilateral primary osteoarthritis, left knee 11/02/2019   . Unilateral primary osteoarthritis, right knee 11/02/2019  . Carpal tunnel syndrome, right upper limb 02/18/2018  . Anxiety and depression 10/28/2016  . Gastroesophageal reflux disease without esophagitis 10/28/2016  . Seasonal allergic rhinitis due to pollen 10/28/2016  . Primary osteoarthritis of left knee 07/25/2015  . Essential hypertension 07/25/2015  . Hypercholesteremia 07/25/2015  . Type 2 diabetes mellitus with diabetic polyneuropathy, without long-term current use of insulin ([facility]01/23/2017  . Pollen allergies 02/10/2014  . Allergic rhinitis due to animal (cat) (dog) hair and dander 09/15/2012  . Extrinsic asthma 09/13/2011  . Proteinuria 11/24/2010   Past Medical History:  Diagnosis Date  . Allergic rhinitis   . Allergy   . Anxiety   . Arthritis   . Benign hypertension   . Cataract   . Depression   . Diabetes mellitus ([place]   type 2   . GERD (gastroesophageal reflux disease)   . History of pneumonia   . HLD (hyperlipidemia)   . Neuromuscular disorder ([facility]   pt denies at 8/19/visit   . Pneumonia    hx of several times per pt - last episode 4-5 years ago    Past Surgical History:  Procedure Laterality Date  . CARPAL TUNNEL RELEASE Bilateral 2020 Right, 2021 left  . CATARACT EXTRACTION     bilateral  . EYE SURGERY    . KNEE SURGERY     right  .  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   Procedure: LEFT TOTAL KNEE ARTHROPLASTY;  Surgeon: Mcarthur Rossetti, MD;  Location: [facility];  Service: Orthopedics;  Laterality: Left;   Allergies  Allergen Reactions  . Actos [Pioglitazone] Other (See Comments), Shortness Of Breath and Swelling    Blood sugar went up and down and sweating profusely  . Codeine Shortness Of Breath and Palpitations   Prior to Admission medications   Medication Sig Start Date End Date Taking? Authorizing Provider  albuterol (VENTOLIN HFA) 108 (90 Base) MCG/ACT inhaler Inhale 2 puffs into the lungs every 6 (six) hours as needed for  wheezing or shortness of breath. 10/26/20  Yes Maximiano Coss, NP  aspirin EC 325 MG EC tablet Take 1 tablet (325 mg total) by mouth 2 (two) times daily after a meal. Patient taking differently: Take 81 mg by mouth 2 (two) times daily after a meal. 02/27/20  Yes Mcarthur Rossetti, MD  blood glucose meter kit and supplies 1 each by Other route as directed. Dispense based on patient and insurance preference. Check once per day (variable times).  Accu check. 07/29/18  Yes Wendie Agreste, MD  cetirizine (ZYRTEC) 10 MG tablet Take 10 mg by mouth daily as needed for allergies.    Yes [provider]  Cholecalciferol (VITAMIN D) 125 MCG (5000 UT) CAPS Take 5,000 Units by mouth daily.   Yes [provider]  Cyanocobalamin (VITAMIN B 12 PO) Take 1,000 mg by mouth daily.    Yes [provider]  cyclobenzaprine (FLEXERIL) 10 MG tablet Take 1 tablet (10 mg total) by mouth 3 (three) times daily as needed for muscle spasms. 10/26/20  Yes Maximiano Coss, NP  diclofenac Sodium (VOLTAREN) 1 % GEL Apply 2 g topically daily.   Yes [provider]  fluticasone (FLONASE) 50 MCG/ACT nasal spray Place 1 spray into both nostrils daily.   Yes [provider]  gabapentin (NEURONTIN) 300 MG capsule Take 1-2 capsules (300-600 mg total) by mouth 2 (two) times daily as needed. 01/25/20  Yes Wendie Agreste, MD  glimepiride (AMARYL) 4 MG tablet Take 1 tablet (4 mg total) by mouth 2 (two) times daily. 07/27/20  Yes Wendie Agreste, MD  glipiZIDE (GLUCOTROL) 5 MG tablet Take 0.5 tablets (2.5 mg total) by mouth daily before breakfast. Take an additional 0.5 tablets (2.5 mg total) by mouth before dinner if blood glucose is above 200. Call clinic if blood glucose above 250. 10/18/20  Yes Maximiano Coss, NP  hydrochlorothiazide (MICROZIDE) 12.5 MG capsule Take 1 capsule by mouth once daily 10/12/20  Yes Wendie Agreste, MD  ibuprofen (ADVIL,MOTRIN) 200 MG tablet Take 400 mg by mouth  in the morning and at bedtime.    Yes [provider]  Lancets (ONETOUCH DELICA PLUS [id number]) [place] USE 1 TO CHECK GLUCOSE ONCE DAILY 08/04/18  Yes [provider]  liraglutide (VICTOZA) 18 MG/3ML SOPN Inject 1.8 mg into the skin daily. I 07/27/20  Yes Wendie Agreste, MD  lisinopril (ZESTRIL) 20 MG tablet Take 1 tablet (20 mg total) by mouth daily. 07/27/20  Yes Wendie Agreste, MD  metFORMIN (GLUCOPHAGE) 1000 MG tablet TAKE 1 TABLET BY MOUTH TWICE DAILY WITH A MEAL 07/27/20  Yes Wendie Agreste, MD  montelukast (SINGULAIR) 10 MG tablet Take 1 tablet (10 mg total) by mouth at bedtime. 05/30/20  Yes Maximiano Coss, NP  Multiple Vitamin (MULTIVITAMIN WITH MINERALS) TABS tablet Take 1 tablet by mouth daily.   Yes [provider]  omeprazole (PRILOSEC) 20 MG capsule Take 1 capsule by mouth once daily 09/15/20  Yes Wendie Agreste, MD  ondansetron (ZOFRAN ODT) 4 MG disintegrating tablet Take 1 tablet (4 mg total) by mouth every 8 (eight) hours as needed for nausea or vomiting. 02/27/20  Yes Mcarthur Rossetti, MD  [facility] ULTRA test strip USE 1 STRIP TO CHECK GLUCOSE ONCE DAILY 11/25/18  Yes Wendie Agreste, MD  pravastatin (PRAVACHOL) 80 MG tablet Take 1 tablet (80 mg total) by mouth daily. 07/27/20  Yes Wendie Agreste, MD  predniSONE (DELTASONE) 10 MG tablet Take 1 tablet (10 mg total) by mouth daily with breakfast. 10/18/20  Yes Maximiano Coss, NP  sertraline (ZOLOFT) 100 MG tablet START WITH 1 TABLET BY MOUTH ONCE DAILY FOR 2 WEEKS, THEN INCREASE TO 2 TABLET DAILY. 09/15/20  Yes Wendie Agreste, MD  sodium chloride (OCEAN) 0.65 % SOLN nasal spray Place 1 spray into both nostrils as needed for congestion.   Yes [provider]  traMADol (ULTRAM) 50 MG tablet Take 1 tablet (50 mg total) by mouth every 6 (six) hours as needed. 10/26/20  Yes Maximiano Coss, NP  vitamin E 180 MG (400 UNITS) capsule Take 400 Units by mouth daily.   Yes [provider]   Social History   Socioeconomic History  . Marital status: Married    Spouse name: Not on file  . Number of children: 3  . Years of education: 74  . Highest education level: Not on file  Occupational History  . Occupation: Dealer, Company secretary  Tobacco Use  . Smoking status: Never Smoker  . Smokeless tobacco: Never Used  Vaping Use  . Vaping Use: Never used  Substance and Sexual Activity  . Alcohol use: No  . Drug use: No  . Sexual activity: Not Currently  Other Topics Concern  . Not on file  Social History Narrative  . Not on file   Social Determinants of Health   Financial Resource Strain: Not on file  Food Insecurity: Not on file  Transportation Needs: Not on file  Physical Activity: Not on file  Stress: Not on file  Social Connections: Not on file  Intimate Partner Violence: Not on file    Review of Systems Per HPI.   Objective:   Vitals:   11/10/20 1357  BP: 134/66  Pulse: 83  Resp: 16  Temp: 98 F (36.7 C)  TempSrc: Temporal  SpO2: 95%  Weight: 241 lb 9.6 oz (109.6 kg)  Height: '5\' 8"'  (1.727 m)     Physical Exam Vitals reviewed.  Constitutional:      Appearance: He is well-developed.  HENT:     Head: Normocephalic and atraumatic.  Eyes:     Pupils: Pupils are equal, round, and reactive to light.  Neck:     Vascular: No carotid bruit or JVD.  Cardiovascular:     Rate and Rhythm: Normal rate and regular rhythm.     Heart sounds: Normal heart sounds. No murmur heard.   Pulmonary:     Effort: Pulmonary effort is normal.     Breath sounds: Normal breath sounds. No rales.  Musculoskeletal:     Comments: C-spine decreased range of motion diffusely, no midline bony tenderness.  Slight tender to palpation of paraspinals but primarily at levator scapulae, trapezius bilaterally.  Shoulder musculature nontender, AC/[place]/clavicle nontender.  Upper extremity strength equal bilaterally, reflexes 2+ at biceps, brachioradialis, triceps  bilaterally.  Skin:    General: Skin  is warm and dry.  Neurological:     Mental Status: He is alert and oriented to person, place, and time.     33 minutes spent during visit, greater than 50% counseling and assimilation of information, chart review, and discussion of plan.    Assessment & Plan:  FINIAN HELVEY is a 70 y.o. male . Neck pain - Plan: Ambulatory referral to Physical Therapy, DG Cervical Spine Complete, CANCELED: DG Cervical Spine Complete Muscle spasm - Plan: Ambulatory referral to Physical Therapy, DG Cervical Spine Complete, CANCELED: DG Cervical Spine Complete  -Suspect some component of degenerative disc disease, but primary area of discomfort seems to be more paraspinals, levator scapulae, trapezius.  Suspect physical therapy may be helpful.  Referral placed.  Check x-ray.  RTC precautions.  Type 2 diabetes mellitus with diabetic polyneuropathy, without long-term current use of insulin (HCC) - Plan: Basic metabolic panel, Hemoglobin A1c  -Variable readings as above, suspect some component of decreased control during use of prednisone.  Continue same regimen for now, off glipizide.  Check A1c, BMP, keep follow-up in July.  No orders of the defined types were placed in this encounter.  Patient Instructions  Xray of neck at [facility].  I will refer you to physical therapy.   continue metformin, glimepiride, and victoza same dose for now. I will check A1c to decide on other changes. Keep follow up in July.      Signed, Merri Ray, MD [facility]

## 2020-11-10 NOTE — Patient Instructions (Addendum)
Xray of neck at [facility].  I will refer you to physical therapy.   continue metformin, glimepiride, and victoza same dose for now. I will check A1c to decide on other changes. Keep follow up in July.

## 2020-11-11 LAB — BASIC METABOLIC PANEL
BUN: 28 mg/dL — ABNORMAL HIGH (ref 6–23)
CO2: 27 mEq/L (ref 19–32)
Calcium: 9.1 mg/dL (ref 8.4–10.5)
Chloride: 101 mEq/L (ref 96–112)
Creatinine, Ser: 1.28 mg/dL (ref 0.40–1.50)
GFR: 56.16 mL/min — ABNORMAL LOW (ref >60.00)
Glucose, Bld: 94 mg/dL (ref 70–99)
Potassium: 4.5 mEq/L (ref 3.5–5.1)
Sodium: 137 mEq/L (ref 135–145)

## 2020-11-11 LAB — HEMOGLOBIN A1C: Hgb A1c MFr Bld: 9.1 % — ABNORMAL HIGH (ref 4.6–6.5)

## 2020-11-18 ENCOUNTER — Encounter: Payer: Self-pay | Admitting: Family Medicine

## 2020-11-20 ENCOUNTER — Encounter: Payer: Self-pay | Admitting: Family Medicine

## 2020-11-21 ENCOUNTER — Other Ambulatory Visit: Payer: Medicare Other

## 2020-11-21 ENCOUNTER — Ambulatory Visit (INDEPENDENT_AMBULATORY_CARE_PROVIDER_SITE_OTHER): Payer: Medicare Other

## 2020-11-21 ENCOUNTER — Other Ambulatory Visit: Payer: Self-pay

## 2020-11-21 DIAGNOSIS — M62838 Other muscle spasm: Secondary | ICD-10-CM | POA: Diagnosis not present

## 2020-11-21 DIAGNOSIS — M47812 Spondylosis without myelopathy or radiculopathy, cervical region: Secondary | ICD-10-CM | POA: Diagnosis not present

## 2020-11-21 DIAGNOSIS — M4692 Unspecified inflammatory spondylopathy, cervical region: Secondary | ICD-10-CM | POA: Diagnosis not present

## 2020-11-21 DIAGNOSIS — M542 Cervicalgia: Secondary | ICD-10-CM

## 2020-12-01 ENCOUNTER — Telehealth: Payer: Self-pay

## 2020-12-01 DIAGNOSIS — Z9189 Other specified personal risk factors, not elsewhere classified: Secondary | ICD-10-CM

## 2020-12-01 NOTE — Progress Notes (Signed)
[facility])                                            [place] Team                                        Statin Quality Measure Assessment    12/01/2020  Daniel [name] December 31, 1950 [id number]  Per review of chart and payor information, this patient has been flagged for non-adherence to the following CMS Quality Measure:   [x]  Statin Use in Persons with Diabetes  []  Statin Use in Persons with Cardiovascular Disease  The 10-year ASCVD risk score [name] DC Brooke Bonito., et al., 2013) is: 42.1%   Values used to calculate the score:     Age: 70 years     Sex: Male     Is Non-Hispanic African American: No     Diabetic: Yes     Tobacco smoker: No     Systolic Blood Pressure: 191 mmHg     Is BP treated: Yes     HDL Cholesterol: 57 mg/dL     Total Cholesterol: 241 mg/dL   Currently prescribed statin:  [x]  Yes []  No     Comments:Pravastatin 80 mg daily.    Per pharmacy claims, patient last filled pravastatin 80 mg  on 12/20/2019. I called and spoke with the patient today. He stated that he had a stockpile of pravastatin for a 90 d/s, he is not sure how that happened, and finally has ~ 14 tablets left. He denied non-adherence to pravastatin. While I was on the phone with the patient, he called and had pravastatin refilled and plans to pick up the medication soon.   Thank you for your time,  Kristeen Miss, [place] Cell: [phone]

## 2020-12-16 ENCOUNTER — Other Ambulatory Visit: Payer: Self-pay | Admitting: Registered Nurse

## 2020-12-16 DIAGNOSIS — G8929 Other chronic pain: Secondary | ICD-10-CM

## 2020-12-16 NOTE — Telephone Encounter (Signed)
Controlled substance database (PDMP) reviewed. No concerns appreciated.  Last filled 10/31/20. Discussed meds and need at recent visits. Refill ordered.

## 2020-12-16 NOTE — Telephone Encounter (Signed)
Last refill: 10/26/20 #120, 0 Last OV: 11/10/20 dx. Neck pain

## 2020-12-26 ENCOUNTER — Ambulatory Visit (INDEPENDENT_AMBULATORY_CARE_PROVIDER_SITE_OTHER): Payer: Medicare Other | Admitting: *Deleted

## 2020-12-26 DIAGNOSIS — Z0000 Encounter for general adult medical examination without abnormal findings: Secondary | ICD-10-CM | POA: Diagnosis not present

## 2020-12-26 NOTE — Patient Instructions (Signed)
Daniel [name] , Thank you for taking time to come for your Medicare Wellness Visit. I appreciate your ongoing commitment to your health goals. Please review the following plan we discussed and let me know if I can assist you in the future.   Screening recommendations/referrals: Colonoscopy: patient will schedule Recommended yearly ophthalmology/optometry visit for glaucoma screening and checkup Recommended yearly dental visit for hygiene and checkup  Vaccinations: Influenza vaccine: up to date Pneumococcal vaccine: up to date Tdap vaccine: up to date Shingles vaccine: per patient up do date    Advanced directives: copy requested  Conditions/risks identified: na    Preventive Care 41 Years and Older, Male Preventive care refers to lifestyle choices and visits with your health care provider that can promote health and wellness. What does preventive care include? A yearly physical exam. This is also called an annual well check. Dental exams once or twice a year. Routine eye exams. Ask your health care provider how often you should have your eyes checked. Personal lifestyle choices, including: Daily care of your teeth and gums. Regular physical activity. Eating a healthy diet. Avoiding tobacco and drug use. Limiting alcohol use. Practicing safe sex. Taking low doses of aspirin every day. Taking vitamin and mineral supplements as recommended by your health care provider. What happens during an annual well check? The services and screenings done by your health care provider during your annual well check will depend on your age, overall health, lifestyle risk factors, and family history of disease. Counseling  Your health care provider may ask you questions about your: Alcohol use. Tobacco use. Drug use. Emotional well-being. Home and relationship well-being. Sexual activity. Eating habits. History of falls. Memory and ability to understand (cognition). Work and work  Statistician. Screening  You may have the following tests or measurements: Height, weight, and BMI. Blood pressure. Lipid and cholesterol levels. These may be checked every 5 years, or more frequently if you are over 44 years old. Skin check. Lung cancer screening. You may have this screening every year starting at age 34 if you have a 30-pack-year history of smoking and currently smoke or have quit within the past 15 years. Fecal occult blood test (FOBT) of the stool. You may have this test every year starting at age 48. Flexible sigmoidoscopy or colonoscopy. You may have a sigmoidoscopy every 5 years or a colonoscopy every 10 years starting at age 73. Prostate cancer screening. Recommendations will vary depending on your family history and other risks. Hepatitis C blood test. Hepatitis B blood test. Sexually transmitted disease (STD) testing. Diabetes screening. This is done by checking your blood sugar (glucose) after you have not eaten for a while (fasting). You may have this done every 1-3 years. Abdominal aortic aneurysm (AAA) screening. You may need this if you are a current or former smoker. Osteoporosis. You may be screened starting at age 74 if you are at high risk. Talk with your health care provider about your test results, treatment options, and if necessary, the need for more tests. Vaccines  Your health care provider may recommend certain vaccines, such as: Influenza vaccine. This is recommended every year. Tetanus, diphtheria, and acellular pertussis (Tdap, Td) vaccine. You may need a Td booster every 10 years. Zoster vaccine. You may need this after age 79. Pneumococcal 13-valent conjugate (PCV13) vaccine. One dose is recommended after age 32. Pneumococcal polysaccharide (PPSV23) vaccine. One dose is recommended after age 75. Talk to your health care provider about which screenings and vaccines you need  and how often you need them. This information is not intended to replace  advice given to you by your health care provider. Make sure you discuss any questions you have with your health care provider. Document Released: 07/15/2015 Document Revised: 03/07/2016 Document Reviewed: 04/19/2015 Elsevier Interactive Patient Education  2017 [place] Prevention in the Home Falls can cause injuries. They can happen to people of all ages. There are many things you can do to make your home safe and to help prevent falls. What can I do on the outside of my home? Regularly fix the edges of walkways and driveways and fix any cracks. Remove anything that might make you trip as you walk through a door, such as a raised step or threshold. Trim any bushes or trees on the path to your home. Use bright outdoor lighting. Clear any walking paths of anything that might make someone trip, such as rocks or tools. Regularly check to see if handrails are loose or broken. Make sure that both sides of any steps have handrails. Any raised decks and porches should have guardrails on the edges. Have any leaves, snow, or ice cleared regularly. Use sand or salt on walking paths during winter. Clean up any spills in your garage right away. This includes oil or grease spills. What can I do in the bathroom? Use night lights. Install grab bars by the toilet and in the tub and shower. Do not use towel bars as grab bars. Use non-skid mats or decals in the tub or shower. If you need to sit down in the shower, use a plastic, non-slip stool. Keep the floor dry. Clean up any water that spills on the floor as soon as it happens. Remove soap buildup in the tub or shower regularly. Attach bath mats securely with double-sided non-slip rug tape. Do not have throw rugs and other things on the floor that can make you trip. What can I do in the bedroom? Use night lights. Make sure that you have a light by your bed that is easy to reach. Do not use any sheets or blankets that are too big for your bed.  They should not hang down onto the floor. Have a firm chair that has side arms. You can use this for support while you get dressed. Do not have throw rugs and other things on the floor that can make you trip. What can I do in the kitchen? Clean up any spills right away. Avoid walking on wet floors. Keep items that you use a lot in easy-to-reach places. If you need to reach something above you, use a strong step stool that has a grab bar. Keep electrical cords out of the way. Do not use floor polish or wax that makes floors slippery. If you must use wax, use non-skid floor wax. Do not have throw rugs and other things on the floor that can make you trip. What can I do with my stairs? Do not leave any items on the stairs. Make sure that there are handrails on both sides of the stairs and use them. Fix handrails that are broken or loose. Make sure that handrails are as long as the stairways. Check any carpeting to make sure that it is firmly attached to the stairs. Fix any carpet that is loose or worn. Avoid having throw rugs at the top or bottom of the stairs. If you do have throw rugs, attach them to the floor with carpet tape. Make sure that you  have a light switch at the top of the stairs and the bottom of the stairs. If you do not have them, ask someone to add them for you. What else can I do to help prevent falls? Wear shoes that: Do not have high heels. Have rubber bottoms. Are comfortable and fit you well. Are closed at the toe. Do not wear sandals. If you use a stepladder: Make sure that it is fully opened. Do not climb a closed stepladder. Make sure that both sides of the stepladder are locked into place. Ask someone to hold it for you, if possible. Clearly mark and make sure that you can see: Any grab bars or handrails. First and last steps. Where the edge of each step is. Use tools that help you move around (mobility aids) if they are needed. These  include: Canes. Walkers. Scooters. Crutches. Turn on the lights when you go into a dark area. Replace any light bulbs as soon as they burn out. Set up your furniture so you have a clear path. Avoid moving your furniture around. If any of your floors are uneven, fix them. If there are any pets around you, be aware of where they are. Review your medicines with your doctor. Some medicines can make you feel dizzy. This can increase your chance of falling. Ask your doctor what other things that you can do to help prevent falls. This information is not intended to replace advice given to you by your health care provider. Make sure you discuss any questions you have with your health care provider. Document Released: 04/14/2009 Document Revised: 11/24/2015 Document Reviewed: 07/23/2014 Elsevier Interactive Patient Education  2017 [employer].

## 2020-12-26 NOTE — Progress Notes (Signed)
Subjective:   Daniel [name] is a 70 y.o. male who presents for Medicare Annual/Subsequent preventive examination.  Review of Systems    NA Cardiac Risk Factors include: advanced age (>65mn, >>83women);diabetes mellitus;dyslipidemia;male gender;hypertension     Objective:    Today's Vitals   There is no height or weight on file to calculate BMI.  Advanced Directives 12/26/2020 02/26/2020 02/18/2020 01/25/2020 03/04/2019 10/03/2016 02/28/2016  Does Patient Have a Medical Advance Directive? Yes Yes Yes Yes Yes Yes Yes  Type of Advance Directive Living will [place] Ranch[place]ela[place] - Living will;Healthcare Power of Attorney Living will Living will  Does patient want to make changes to medical advance directive? - No - Patient declined - No - Patient declined No - Patient declined - -  Copy of [place] Chart? - - - - No - copy requested - -    Current Medications (verified) Outpatient Encounter Medications as of 12/26/2020  Medication Sig   albuterol (VENTOLIN HFA) 108 (90 Base) MCG/ACT inhaler Inhale 2 puffs into the lungs every 6 (six) hours as needed for wheezing or shortness of breath.   aspirin EC 325 MG EC tablet Take 1 tablet (325 mg total) by mouth 2 (two) times daily after a meal. (Patient taking differently: Take 81 mg by mouth 2 (two) times daily after a meal.)   blood glucose meter kit and supplies 1 each by Other route as directed. Dispense based on patient and insurance preference. Check once per day (variable times).  Accu check.   cetirizine (ZYRTEC) 10 MG tablet Take 10 mg by mouth daily as needed for allergies.    Cholecalciferol (VITAMIN D) 125 MCG (5000 UT) CAPS Take 5,000 Units by mouth daily.   Cyanocobalamin (VITAMIN B 12 PO) Take 1,000 mg by mouth daily.    cyclobenzaprine (FLEXERIL) 10 MG tablet Take 1 tablet (10 mg total) by mouth 3 (three) times daily as needed for muscle spasms.    diclofenac Sodium (VOLTAREN) 1 % GEL Apply 2 g topically daily.   fluticasone (FLONASE) 50 MCG/ACT nasal spray Place 1 spray into both nostrils daily.   gabapentin (NEURONTIN) 300 MG capsule Take 1-2 capsules (300-600 mg total) by mouth 2 (two) times daily as needed.   glimepiride (AMARYL) 4 MG tablet Take 1 tablet (4 mg total) by mouth 2 (two) times daily.   hydrochlorothiazide (MICROZIDE) 12.5 MG capsule Take 1 capsule by mouth once daily   ibuprofen (ADVIL,MOTRIN) 200 MG tablet Take 400 mg by mouth in the morning and at bedtime.    Lancets (ONETOUCH DELICA PLUS [id number] MISC USE 1 TO CHECK GLUCOSE ONCE DAILY   liraglutide (VICTOZA) 18 MG/3ML SOPN Inject 1.8 mg into the skin daily. I   lisinopril (ZESTRIL) 20 MG tablet Take 1 tablet (20 mg total) by mouth daily.   metFORMIN (GLUCOPHAGE) 1000 MG tablet TAKE 1 TABLET BY MOUTH TWICE DAILY WITH A MEAL   montelukast (SINGULAIR) 10 MG tablet Take 1 tablet (10 mg total) by mouth at bedtime.   Multiple Vitamin (MULTIVITAMIN WITH MINERALS) TABS tablet Take 1 tablet by mouth daily.   omeprazole (PRILOSEC) 20 MG capsule Take 1 capsule by mouth once daily   ondansetron (ZOFRAN ODT) 4 MG disintegrating tablet Take 1 tablet (4 mg total) by mouth every 8 (eight) hours as needed for nausea or vomiting.   ONETOUCH ULTRA test strip USE 1 STRIP TO CHECK GLUCOSE ONCE DAILY   pravastatin (PRAVACHOL) 80  MG tablet Take 1 tablet (80 mg total) by mouth daily.   sodium chloride (OCEAN) 0.65 % SOLN nasal spray Place 1 spray into both nostrils as needed for congestion.   traMADol (ULTRAM) 50 MG tablet TAKE 1 TABLET BY MOUTH EVERY 6 HOURS AS NEEDED   vitamin E 180 MG (400 UNITS) capsule Take 400 Units by mouth daily.   predniSONE (DELTASONE) 10 MG tablet Take 1 tablet (10 mg total) by mouth daily with breakfast. (Patient not taking: Reported on 12/26/2020)   sertraline (ZOLOFT) 100 MG tablet START WITH 1 TABLET BY MOUTH ONCE DAILY FOR 2 WEEKS, THEN INCREASE TO 2 TABLET  DAILY.   No facility-administered encounter medications on file as of 12/26/2020.    Allergies (verified) Actos [pioglitazone] and Codeine   History: Past Medical History:  Diagnosis Date   Allergic rhinitis    Allergy    Anxiety    Arthritis    Benign hypertension    Cataract    Depression    Diabetes mellitus ([place])    type 2    GERD (gastroesophageal reflux disease)    History of pneumonia    HLD (hyperlipidemia)    Neuromuscular disorder ([place])    pt denies at 8/19/visit    Pneumonia    hx of several times per pt - last episode 4-5 years ago    Past Surgical History:  Procedure Laterality Date   CARPAL TUNNEL RELEASE Bilateral 2020 Right, 2021 left   CATARACT EXTRACTION     bilateral   EYE SURGERY     KNEE SURGERY     right   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   Procedure: LEFT TOTAL KNEE ARTHROPLASTY;  Surgeon: Mcarthur Rossetti, MD;  Location: [facility];  Service: Orthopedics;  Laterality: Left;   Family History  Problem Relation Age of Onset   Diabetes Father    Colon polyps Father    Atrial fibrillation Mother    Prostate cancer Paternal Uncle    Cancer Sister    Esophageal cancer Neg Hx    Rectal cancer Neg Hx    Stomach cancer Neg Hx    Social History   Socioeconomic History   Marital status: Married    Spouse name: Not on file   Number of children: 3   Years of education: 16   Highest education level: Not on file  Occupational History   Occupation: Dealer, Company secretary  Tobacco Use   Smoking status: Never   Smokeless tobacco: Never  Vaping Use   Vaping Use: Never used  Substance and Sexual Activity   Alcohol use: No   Drug use: No   Sexual activity: Not Currently  Other Topics Concern   Not on file  Social History Narrative   Not on file   Social Determinants of Health   Financial Resource Strain: Low Risk    Difficulty of Paying Living Expenses: Not hard at all  Food Insecurity: No Food Insecurity   Worried About  Charity fundraiser in the Last Year: Never true   [place] in the Last Year: Never true  Transportation Needs: No Transportation Needs   Lack of Transportation (Medical): No   Lack of Transportation (Non-Medical): No  Physical Activity: Sufficiently Active   Days of Exercise per Week: 5 days   Minutes of Exercise per Session: 40 min  Stress: No Stress Concern Present   Feeling of Stress : Not at all  Social Connections: Socially Integrated   Frequency  of Communication with Friends and Family: More than three times a week   Frequency of Social Gatherings with Friends and Family: More than three times a week   Attends Religious Services: More than 4 times per year   Active Member of [employer] or Organizations: Yes   Attends Archivist Meetings: 1 to 4 times per year   Marital Status: Married    Tobacco Counseling Counseling given: Not Answered   Clinical Intake:  Pre-visit preparation completed: Yes  Pain : No/denies pain     Diabetes: Yes CBG done?: No Did pt. bring in CBG monitor from home?: No  How often do you need to have someone help you when you read instructions, pamphlets, or other written materials from your doctor or pharmacy?: 1 - Never  Diabetic?  YES  Nutrition Risk Assessment:  Has the patient had any N/V/D within the last 2 months?  No  Does the patient have any non-healing wounds?  No  Has the patient had any unintentional weight loss or weight gain?  No   Diabetes:  Is the patient diabetic?  Yes  If diabetic, was a CBG obtained today?  No  Did the patient bring in their glucometer from home?  No  How often do you monitor your CBG's? 2 X day  Financial Strains and Diabetes Management:  Are you having any financial strains with the device, your supplies or your medication? No .  Does the patient want to be seen by Chronic Care Management for management of their diabetes?  No  Would the patient like to be referred to a Nutritionist or  for Diabetic Management?  No   Diabetic Exams:  Diabetic Eye Exam: Completed 2022. Overdue for diabetic eye exam. Pt has been advised about the importance in completing this exam. A referral has been placed today. Message sent to referral coordinator for scheduling purposes. Advised pt to expect a call from office referred to regarding appt.  Diabetic Foot Exam: Completed. Pt has been advised about the importance in completing this exam.   Interpreter Needed?: No  Information entered by :: Leroy Kennedy LPN   Activities of Daily Living In your present state of health, do you have any difficulty performing the following activities: 12/26/2020 02/26/2020  Hearing? N Y  Comment - -  Vision? - N  Difficulty concentrating or making decisions? N N  Walking or climbing stairs? N Y  Dressing or bathing? N N  Doing errands, shopping? N N  Preparing Food and eating ? N -  Using the Toilet? N -  In the past six months, have you accidently leaked urine? N -  Do you have problems with loss of bowel control? N -  Managing your Medications? N -  Managing your Finances? N -  Housekeeping or managing your Housekeeping? N -  Some recent data might be hidden    Patient Care Team: Wendie Agreste, MD as PCP - General (Family Medicine)  Indicate any recent Medical Services you may have received from other than [facility] providers in the past year (date may be approximate).     Assessment:   This is a routine wellness examination for [place].  Hearing/Vision screen Hearing Screening - Comments:: No trouble hearing Vision Screening - Comments:: Up to Date Sees [place]  Dietary issues and exercise activities discussed: Current Exercise Habits: Home exercise routine, Type of exercise: walking;Other - see comments (stationary bike at home every other day), Time (Minutes): 40,  Frequency (Times/Week): 5, Weekly Exercise (Minutes/Week): 200, Intensity: Moderate   Goals Addressed              This Visit's Progress    Patient Stated   Not on track    Loose more weight      Patient Stated       To get A1C down, loose 10lbs        Depression Screen PHQ 2/9 Scores 12/26/2020 11/10/2020 09/02/2020 07/27/2020 05/30/2020 04/25/2020 01/25/2020  PHQ - 2 Score 0 0 0 0 0 0 0    Fall Risk Fall Risk  12/26/2020 11/10/2020 09/02/2020 07/27/2020 05/30/2020  Falls in the past year? 1 0 0 1 0  Number falls in past yr: 0 - 0 0 0  Injury with Fall? 0 - 0 0 0  Comment - - - - -  Risk for fall due to : - - - - -  Follow up Falls evaluation completed;Falls prevention discussed Falls evaluation completed Falls evaluation completed Falls evaluation completed Falls evaluation completed    FALL RISK PREVENTION PERTAINING TO THE HOME:  Any stairs in or around the home? Yes  If so, are there any without handrails? Yes  Home free of loose throw rugs in walkways, pet beds, electrical cords, etc? Yes  Adequate lighting in your home to reduce risk of falls? Yes   ASSISTIVE DEVICES UTILIZED TO PREVENT FALLS:  Life alert? No  Use of a cane, walker or w/c? Yes  Grab bars in the bathroom? Yes  Shower chair or bench in shower? No  Elevated toilet seat or a handicapped toilet? No   TIMED UP AND GO:  Was the test performed? No .  Tele-health visit    Cognitive Function:  Normal cognitive status assessed by direct observation by this Nurse Health Advisor. No abnormalities found.       6CIT Screen 01/25/2020 03/04/2019  What Year? 0 points 0 points  What month? - 0 points  What time? 0 points 0 points  Count back from 20 0 points 0 points  Months in reverse 0 points 0 points  Repeat phrase 0 points 0 points  Total Score - 0    Immunizations Immunization History  Administered Date(s) Administered   Fluad Quad(high Dose 65+) 02/11/2019, 04/06/2020   Influenza, High Dose Seasonal PF 07/14/2018, 07/14/2018   Influenza,inj,Quad PF,6+ Mos 05/03/2015, 02/28/2016, 04/08/2017    PFIZER(Purple Top)SARS-COV-2 Vaccination 08/31/2019, 09/21/2019   Pneumococcal Conjugate-13 09/13/2016   Pneumococcal Polysaccharide-23 07/19/2010, 02/11/2019   Tdap 03/14/2006, 12/31/2017    TDAP status: Up to date  Flu Vaccine status: Up to date  Pneumococcal vaccine status: Up to date  Covid-19 vaccine status: Completed vaccines  Qualifies for Shingles Vaccine? No   Zostavax completed No   Shingrix Completed?: Yes  Screening Tests Health Maintenance  Topic Date Due   Zoster Vaccines- Shingrix (1 of 2) Never done   COVID-19 Vaccine (3 - Pfizer risk series) 10/19/2019   COLONOSCOPY (Pts 45-33yr Insurance coverage will need to be confirmed)  01/24/2021 (Originally 10/12/2019)   INFLUENZA VACCINE  01/30/2021   FOOT EXAM  04/25/2021   OPHTHALMOLOGY EXAM  05/09/2021   HEMOGLOBIN A1C  05/13/2021   TETANUS/TDAP  01/01/2028   Hepatitis C Screening  Completed   PNA vac Low Risk Adult  Completed   HPV VACCINES  Aged Out    Health Maintenance  Health Maintenance Due  Topic Date Due   Zoster Vaccines- Shingrix (1 of 2) Never done   COVID-19  Vaccine (3 - Pfizer risk series) 10/19/2019    Colonoscopy Patient just received a call to schedule.   Lung Cancer Screening: (Low Dose CT Chest recommended if Age 1-80 years, 30 pack-year currently smoking OR have quit w/in 15years.) does not qualify.   Lung Cancer Screening Referral: na  Additional Screening:  Hepatitis C Screening: does qualify  Vision Screening: Recommended annual ophthalmology exams for early detection of glaucoma and other disorders of the eye. Is the patient up to date with their annual eye exam?  Yes  Who is the provider or what is the name of the office in which the patient attends annual eye exams? [facility]  If pt is not established with a provider, would they like to be referred to a provider to establish care?  Established .   Dental Screening: Recommended annual dental exams for proper oral  hygiene  Community Resource Referral / Chronic Care Management: CRR required this visit?  No   CCM required this visit?  No      Plan:     I have personally reviewed and noted the following in the patient's chart:   Medical and social history Use of alcohol, tobacco or illicit drugs  Current medications and supplements including opioid prescriptions. Patient is not currently taking opioid prescriptions. Functional ability and status Nutritional status Physical activity Advanced directives List of other physicians Hospitalizations, surgeries, and ER visits in previous 12 months Vitals Screenings to include cognitive, depression, and falls Referrals and appointments  In addition, I have reviewed and discussed with patient certain preventive protocols, quality metrics, and best practice recommendations. A written personalized care plan for preventive services as well as general preventive health recommendations were provided to patient.     Leroy Kennedy, LPN   [id number]   Nurse Notes:       Subjective:   Daniel [name] is a 70 y.o. male who presents for Medicare Annual/Subsequent preventive examination.  Review of Systems     Cardiac Risk Factors include: advanced age (>63mn, >>35women);diabetes mellitus;dyslipidemia;male gender;hypertension     Objective:    Today's Vitals   There is no height or weight on file to calculate BMI.  Advanced Directives 12/26/2020 02/26/2020 02/18/2020 01/25/2020 03/04/2019 10/03/2016 02/28/2016  Does Patient Have a Medical Advance Directive? Yes Yes Yes Yes Yes Yes Yes  Type of Advance Directive Living will [place] Springs[place] will [place] ArribaLiving will - Living will;Healthcare Power of Attorney Living will Living will  Does patient want to make changes to medical advance directive? - No - Patient declined - No - Patient declined No - Patient declined - -  Copy of [place] Chart? - -  - - No - copy requested - -    Current Medications (verified) Outpatient Encounter Medications as of 12/26/2020  Medication Sig   albuterol (VENTOLIN HFA) 108 (90 Base) MCG/ACT inhaler Inhale 2 puffs into the lungs every 6 (six) hours as needed for wheezing or shortness of breath.   aspirin EC 325 MG EC tablet Take 1 tablet (325 mg total) by mouth 2 (two) times daily after a meal. (Patient taking differently: Take 81 mg by mouth 2 (two) times daily after a meal.)   blood glucose meter kit and supplies 1 each by Other route as directed. Dispense based on patient and insurance preference. Check once per day (variable times).  Accu check.   cetirizine (ZYRTEC) 10 MG tablet Take 10 mg by mouth daily  as needed for allergies.    Cholecalciferol (VITAMIN D) 125 MCG (5000 UT) CAPS Take 5,000 Units by mouth daily.   Cyanocobalamin (VITAMIN B 12 PO) Take 1,000 mg by mouth daily.    cyclobenzaprine (FLEXERIL) 10 MG tablet Take 1 tablet (10 mg total) by mouth 3 (three) times daily as needed for muscle spasms.   diclofenac Sodium (VOLTAREN) 1 % GEL Apply 2 g topically daily.   fluticasone (FLONASE) 50 MCG/ACT nasal spray Place 1 spray into both nostrils daily.   gabapentin (NEURONTIN) 300 MG capsule Take 1-2 capsules (300-600 mg total) by mouth 2 (two) times daily as needed.   glimepiride (AMARYL) 4 MG tablet Take 1 tablet (4 mg total) by mouth 2 (two) times daily.   hydrochlorothiazide (MICROZIDE) 12.5 MG capsule Take 1 capsule by mouth once daily   ibuprofen (ADVIL,MOTRIN) 200 MG tablet Take 400 mg by mouth in the morning and at bedtime.    Lancets (ONETOUCH DELICA PLUS [id number]) MISC USE 1 TO CHECK GLUCOSE ONCE DAILY   liraglutide (VICTOZA) 18 MG/3ML SOPN Inject 1.8 mg into the skin daily. I   lisinopril (ZESTRIL) 20 MG tablet Take 1 tablet (20 mg total) by mouth daily.   metFORMIN (GLUCOPHAGE) 1000 MG tablet TAKE 1 TABLET BY MOUTH TWICE DAILY WITH A MEAL   montelukast (SINGULAIR) 10 MG tablet Take 1  tablet (10 mg total) by mouth at bedtime.   Multiple Vitamin (MULTIVITAMIN WITH MINERALS) TABS tablet Take 1 tablet by mouth daily.   omeprazole (PRILOSEC) 20 MG capsule Take 1 capsule by mouth once daily   ondansetron (ZOFRAN ODT) 4 MG disintegrating tablet Take 1 tablet (4 mg total) by mouth every 8 (eight) hours as needed for nausea or vomiting.   ONETOUCH ULTRA test strip USE 1 STRIP TO CHECK GLUCOSE ONCE DAILY   pravastatin (PRAVACHOL) 80 MG tablet Take 1 tablet (80 mg total) by mouth daily.   sodium chloride (OCEAN) 0.65 % SOLN nasal spray Place 1 spray into both nostrils as needed for congestion.   traMADol (ULTRAM) 50 MG tablet TAKE 1 TABLET BY MOUTH EVERY 6 HOURS AS NEEDED   vitamin E 180 MG (400 UNITS) capsule Take 400 Units by mouth daily.   predniSONE (DELTASONE) 10 MG tablet Take 1 tablet (10 mg total) by mouth daily with breakfast. (Patient not taking: Reported on 12/26/2020)   sertraline (ZOLOFT) 100 MG tablet START WITH 1 TABLET BY MOUTH ONCE DAILY FOR 2 WEEKS, THEN INCREASE TO 2 TABLET DAILY.   No facility-administered encounter medications on file as of 12/26/2020.    Allergies (verified) Actos [pioglitazone] and Codeine   History: Past Medical History:  Diagnosis Date   Allergic rhinitis    Allergy    Anxiety    Arthritis    Benign hypertension    Cataract    Depression    Diabetes mellitus ([facility])    type 2    GERD (gastroesophageal reflux disease)    History of pneumonia    HLD (hyperlipidemia)    Neuromuscular disorder ([place])    pt denies at 8/19/visit    Pneumonia    hx of several times per pt - last episode 4-5 years ago    Past Surgical History:  Procedure Laterality Date   CARPAL TUNNEL RELEASE Bilateral 2020 Right, 2021 left   CATARACT EXTRACTION     bilateral   EYE SURGERY     KNEE SURGERY     right   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   Procedure:  LEFT TOTAL KNEE ARTHROPLASTY;  Surgeon: Mcarthur Rossetti, MD;  Location: [facility];   Service: Orthopedics;  Laterality: Left;   Family History  Problem Relation Age of Onset   Diabetes Father    Colon polyps Father    Atrial fibrillation Mother    Prostate cancer Paternal Uncle    Cancer Sister    Esophageal cancer Neg Hx    Rectal cancer Neg Hx    Stomach cancer Neg Hx    Social History   Socioeconomic History   Marital status: Married    Spouse name: Not on file   Number of children: 3   Years of education: 16   Highest education level: Not on file  Occupational History   Occupation: Dealer, Company secretary  Tobacco Use   Smoking status: Never   Smokeless tobacco: Never  Vaping Use   Vaping Use: Never used  Substance and Sexual Activity   Alcohol use: No   Drug use: No   Sexual activity: Not Currently  Other Topics Concern   Not on file  Social History Narrative   Not on file   Social Determinants of Health   Financial Resource Strain: Low Risk    Difficulty of Paying Living Expenses: Not hard at all  Food Insecurity: No Food Insecurity   Worried About Charity fundraiser in the Last Year: Never true   [place] in the Last Year: Never true  Transportation Needs: No Transportation Needs   Lack of Transportation (Medical): No   Lack of Transportation (Non-Medical): No  Physical Activity: Sufficiently Active   Days of Exercise per Week: 5 days   Minutes of Exercise per Session: 40 min  Stress: No Stress Concern Present   Feeling of Stress : Not at all  Social Connections: Socially Integrated   Frequency of Communication with Friends and Family: More than three times a week   Frequency of Social Gatherings with Friends and Family: More than three times a week   Attends Religious Services: More than 4 times per year   Active Member of [employer] or Organizations: Yes   Attends Archivist Meetings: 1 to 4 times per year   Marital Status: Married    Tobacco Counseling Counseling given: Not Answered   Clinical  Intake:  Pre-visit preparation completed: Yes  Pain : No/denies pain     Diabetes: Yes CBG done?: No Did pt. bring in CBG monitor from home?: No  How often do you need to have someone help you when you read instructions, pamphlets, or other written materials from your doctor or pharmacy?: 1 - Never  Diabetic?YES  Nutrition Risk Assessment:  Has the patient had any N/V/D within the last 2 months?  No  Does the patient have any non-healing wounds?  No  Has the patient had any unintentional weight loss or weight gain?  No   Diabetes:  Is the patient diabetic?  Yes  If diabetic, was a CBG obtained today?  No  Did the patient bring in their glucometer from home?  No  How often do you monitor your CBG's? 2 X a day.   Financial Strains and Diabetes Management:  Are you having any financial strains with the device, your supplies or your medication? No .  Does the patient want to be seen by Chronic Care Management for management of their diabetes?  No  Would the patient like to be referred to a Nutritionist or for Diabetic Management?  No  Diabetic Exams:  Diabetic Eye Exam: Completed 2022. Overdue for diabetic eye exam. Pt has been advised about the importance in completing this exam. A referral has been placed today. Message sent to referral coordinator for scheduling purposes. Advised pt to expect a call from office referred to regarding appt.  Diabetic Foot Exam: Completed  . Pt has been advised about the importance in completing this exam   Interpreter Needed?: No  Information entered by :: Leroy Kennedy LPN   Activities of Daily Living In your present state of health, do you have any difficulty performing the following activities: 12/26/2020 02/26/2020  Hearing? N Y  Comment - -  Vision? - N  Difficulty concentrating or making decisions? N N  Walking or climbing stairs? N Y  Dressing or bathing? N N  Doing errands, shopping? N N  Preparing Food and eating ? N -   Using the Toilet? N -  In the past six months, have you accidently leaked urine? N -  Do you have problems with loss of bowel control? N -  Managing your Medications? N -  Managing your Finances? N -  Housekeeping or managing your Housekeeping? N -  Some recent data might be hidden    Patient Care Team: Wendie Agreste, MD as PCP - General (Family Medicine)  Indicate any recent Medical Services you may have received from other than [facility] providers in the past year (date may be approximate).     Assessment:   This is a routine wellness examination for [place].  Hearing/Vision screen Hearing Screening - Comments:: No trouble hearing Vision Screening - Comments:: Up to Date Sees [place]  Dietary issues and exercise activities discussed: Current Exercise Habits: Home exercise routine, Type of exercise: walking;Other - see comments (stationary bike at home every other day), Time (Minutes): 40, Frequency (Times/Week): 5, Weekly Exercise (Minutes/Week): 200, Intensity: Moderate   Goals Addressed             This Visit's Progress    Patient Stated   Not on track    Loose more weight      Patient Stated       To get A1C down, loose 10lbs       Depression Screen PHQ 2/9 Scores 12/26/2020 11/10/2020 09/02/2020 07/27/2020 05/30/2020 04/25/2020 01/25/2020  PHQ - 2 Score 0 0 0 0 0 0 0    Fall Risk Fall Risk  12/26/2020 11/10/2020 09/02/2020 07/27/2020 05/30/2020  Falls in the past year? 1 0 0 1 0  Number falls in past yr: 0 - 0 0 0  Injury with Fall? 0 - 0 0 0  Comment - - - - -  Risk for fall due to : - - - - -  Follow up Falls evaluation completed;Falls prevention discussed Falls evaluation completed Falls evaluation completed Falls evaluation completed Falls evaluation completed    FALL RISK PREVENTION PERTAINING TO THE HOME:  Any stairs in or around the home? Yes  If so, are there any without handrails? Yes  Home free of loose throw rugs in walkways, pet  beds, electrical cords, etc? Yes  Adequate lighting in your home to reduce risk of falls? Yes   ASSISTIVE DEVICES UTILIZED TO PREVENT FALLS:  Life alert? No  Use of a cane, walker or w/c? No  Grab bars in the bathroom? Yes  Shower chair or bench in shower? No  Elevated toilet seat or a handicapped toilet? No   TIMED UP AND GO:  Was the test performed? No .    Tele heatlh visit   Cognitive Function:     6CIT Screen 01/25/2020 03/04/2019  What Year? 0 points 0 points  What month? - 0 points  What time? 0 points 0 points  Count back from 20 0 points 0 points  Months in reverse 0 points 0 points  Repeat phrase 0 points 0 points  Total Score - 0    Immunizations Immunization History  Administered Date(s) Administered   Fluad Quad(high Dose 65+) 02/11/2019, 04/06/2020   Influenza, High Dose Seasonal PF 07/14/2018, 07/14/2018   Influenza,inj,Quad PF,6+ Mos 05/03/2015, 02/28/2016, 04/08/2017   PFIZER(Purple Top)SARS-COV-2 Vaccination 08/31/2019, 09/21/2019   Pneumococcal Conjugate-13 09/13/2016   Pneumococcal Polysaccharide-23 07/19/2010, 02/11/2019   Tdap 03/14/2006, 12/31/2017    TDAP status: Up to date  Flu Vaccine status: Up to date  Pneumococcal vaccine status: Up to date  Covid-19 vaccine status: Completed vaccines  Qualifies for Shingles Vaccine? No   Zostavax completed No   Shingrix Completed?: Yes  Screening Tests Health Maintenance  Topic Date Due   Zoster Vaccines- Shingrix (1 of 2) Never done   COVID-19 Vaccine (3 - Pfizer risk series) 10/19/2019   COLONOSCOPY (Pts 45-30yr Insurance coverage will need to be confirmed)  01/24/2021 (Originally 10/12/2019)   INFLUENZA VACCINE  01/30/2021   FOOT EXAM  04/25/2021   OPHTHALMOLOGY EXAM  05/09/2021   HEMOGLOBIN A1C  05/13/2021   TETANUS/TDAP  01/01/2028   Hepatitis C Screening  Completed   PNA vac Low Risk Adult  Completed   HPV VACCINES  Aged Out    Health Maintenance  Health Maintenance Due  Topic  Date Due   Zoster Vaccines- Shingrix (1 of 2) Never done   COVID-19 Vaccine (3 - Pfizer risk series) 10/19/2019    Colonoscopy  patient will schedule been contacted by GI to schedule  Lung Cancer Screening: (Low Dose CT Chest recommended if Age 245-80years, 30 pack-year currently smoking OR have quit w/in 15years.) does not qualify.   Lung Cancer Screening Referral: na  Additional Screening:  Hepatitis C Screening: does qualify;  Vision Screening: Recommended annual ophthalmology exams for early detection of glaucoma and other disorders of the eye. Is the patient up to date with their annual eye exam?  Yes  Who is the provider or what is the name of the office in which the patient attends annual eye exams? [facility] If pt is not established with a provider, would they like to be referred to a provider to establish care?  established .   Dental Screening: Recommended annual dental exams for proper oral hygiene  Community Resource Referral / Chronic Care Management: CRR required this visit?  No   CCM required this visit?  No        Plan:     I have personally reviewed and noted the following in the patient's chart:   Medical and social history Use of alcohol, tobacco or illicit drugs  Current medications and supplements including opioid prescriptions. Patient is not currently taking opioid prescriptions. Functional ability and status Nutritional status Physical activity Advanced directives List of other physicians Hospitalizations, surgeries, and ER visits in previous 12 months Vitals Screenings to include cognitive, depression, and falls Referrals and appointments  In addition, I have reviewed and discussed with patient certain preventive protocols, quality metrics, and best practice recommendations. A written personalized care plan for preventive services as well as general preventive health recommendations were provided to patient.  Leroy Kennedy,  LPN   [id number]   Nurse Notes: NA

## 2021-01-03 ENCOUNTER — Encounter (HOSPITAL_BASED_OUTPATIENT_CLINIC_OR_DEPARTMENT_OTHER): Payer: Self-pay

## 2021-01-12 ENCOUNTER — Other Ambulatory Visit: Payer: Self-pay

## 2021-01-12 ENCOUNTER — Ambulatory Visit (HOSPITAL_BASED_OUTPATIENT_CLINIC_OR_DEPARTMENT_OTHER)
Admission: RE | Admit: 2021-01-12 | Discharge: 2021-01-12 | Disposition: A | Discharge status: Home / Self Care | Payer: Medicare Other | Source: Ambulatory Visit | Attending: Registered Nurse | Admitting: Registered Nurse

## 2021-01-12 DIAGNOSIS — G8929 Other chronic pain: Secondary | ICD-10-CM | POA: Insufficient documentation

## 2021-01-12 DIAGNOSIS — M542 Cervicalgia: Secondary | ICD-10-CM | POA: Diagnosis present

## 2021-01-23 ENCOUNTER — Other Ambulatory Visit: Payer: Self-pay | Admitting: Registered Nurse

## 2021-01-23 ENCOUNTER — Other Ambulatory Visit: Payer: Self-pay | Admitting: Family Medicine

## 2021-01-23 DIAGNOSIS — K219 Gastro-esophageal reflux disease without esophagitis: Secondary | ICD-10-CM

## 2021-01-23 DIAGNOSIS — G8929 Other chronic pain: Secondary | ICD-10-CM

## 2021-01-23 DIAGNOSIS — R062 Wheezing: Secondary | ICD-10-CM

## 2021-01-23 NOTE — Telephone Encounter (Signed)
LFD 12/16/20 #120 with no refills LOV 11/10/20 NOV none

## 2021-01-24 NOTE — Telephone Encounter (Signed)
Controlled substance database (PDMP) reviewed. No concerns appreciated.  An office visit in May.  Last filled 12/16/2020, refill granted.

## 2021-03-03 ENCOUNTER — Other Ambulatory Visit: Payer: Self-pay | Admitting: Family Medicine

## 2021-03-03 DIAGNOSIS — G8929 Other chronic pain: Secondary | ICD-10-CM

## 2021-03-03 DIAGNOSIS — M25562 Pain in left knee: Secondary | ICD-10-CM

## 2021-03-03 NOTE — Telephone Encounter (Signed)
Providing temporary refill and forwarding to PCP for further consideration

## 2021-03-03 NOTE — Telephone Encounter (Signed)
LFD 01/24/21 #120 with no refills LOV 11/10/20 NOV none

## 2021-03-20 ENCOUNTER — Other Ambulatory Visit: Payer: Self-pay | Admitting: Family Medicine

## 2021-03-20 DIAGNOSIS — E1142 Type 2 diabetes mellitus with diabetic polyneuropathy: Secondary | ICD-10-CM

## 2021-03-22 ENCOUNTER — Other Ambulatory Visit: Payer: Self-pay | Admitting: Registered Nurse

## 2021-03-22 DIAGNOSIS — G8929 Other chronic pain: Secondary | ICD-10-CM

## 2021-03-22 NOTE — Telephone Encounter (Signed)
Patient is requesting a refill of the following medications: Requested Prescriptions          Pending Prescriptions Disp Refills   traMADol (ULTRAM) 50 MG tablet [Pharmacy Med Name: traMADol HCl 50 MG Oral Tablet] 30 tablet 0      Sig: TAKE 1 TABLET BY MOUTH EVERY 6 HOURS AS NEEDED      Date of patient request: 03/22/2021 Last office visit: 11/10/2020 Date of last refill: 03/03/2021 Last refill amount: 30 Follow up time period per chart: none

## 2021-03-22 NOTE — Telephone Encounter (Signed)
Controlled substance database (PDMP) reviewed. No concerns appreciated.  Last filled for 8 day supply on 9/2 - prior #120 in July - refill ordered.

## 2021-03-22 NOTE — Telephone Encounter (Signed)
Patient is requesting a refill of the following medications: Requested Prescriptions   Pending Prescriptions Disp Refills   traMADol (ULTRAM) 50 MG tablet [Pharmacy Med Name: traMADol HCl 50 MG Oral Tablet] 30 tablet 0    Sig: TAKE 1 TABLET BY MOUTH EVERY 6 HOURS AS NEEDED    Date of patient request: 03/22/2021 Last office visit: 11/10/2020 Date of last refill: 03/03/2021 Last refill amount: 30 Follow up time period per chart: none

## 2021-04-18 ENCOUNTER — Telehealth: Payer: Self-pay | Admitting: *Deleted

## 2021-04-18 NOTE — Telephone Encounter (Signed)
Ortho bundle 1 year call attempted x 2. Unable to reach patient.

## 2021-04-27 ENCOUNTER — Other Ambulatory Visit: Payer: Self-pay | Admitting: Family Medicine

## 2021-04-27 DIAGNOSIS — G8929 Other chronic pain: Secondary | ICD-10-CM

## 2021-04-27 DIAGNOSIS — M25562 Pain in left knee: Secondary | ICD-10-CM

## 2021-04-28 NOTE — Telephone Encounter (Signed)
Patient is requesting a refill of the following medications: Requested Prescriptions   Pending Prescriptions Disp Refills   traMADol (ULTRAM) 50 MG tablet [Pharmacy Med Name: traMADol HCl 50 MG Oral Tablet] 120 tablet 0    Sig: TAKE 1 TABLET BY MOUTH EVERY 6 HOURS AS NEEDED    Date of patient request: 04/28/2021 Last office visit: 11/10/20 Date of last refill: 03/22/2021 Last refill amount: 120 Follow up time period per chart: 6 months

## 2021-04-29 NOTE — Telephone Encounter (Signed)
Controlled substance database reviewed, last filled September 22.  Tramadol refill ordered.

## 2021-05-02 ENCOUNTER — Other Ambulatory Visit: Payer: Self-pay | Admitting: Family Medicine

## 2021-05-02 DIAGNOSIS — K219 Gastro-esophageal reflux disease without esophagitis: Secondary | ICD-10-CM

## 2021-05-17 ENCOUNTER — Other Ambulatory Visit: Payer: Self-pay | Admitting: Family Medicine

## 2021-05-17 DIAGNOSIS — E1142 Type 2 diabetes mellitus with diabetic polyneuropathy: Secondary | ICD-10-CM

## 2021-05-30 ENCOUNTER — Other Ambulatory Visit: Payer: Self-pay

## 2021-05-30 ENCOUNTER — Encounter: Payer: Self-pay | Admitting: Registered Nurse

## 2021-05-30 ENCOUNTER — Ambulatory Visit (INDEPENDENT_AMBULATORY_CARE_PROVIDER_SITE_OTHER): Payer: Medicare Other | Admitting: Registered Nurse

## 2021-05-30 VITALS — BP 152/73 | HR 72 | Temp 98.3°F | Resp 18 | Ht 68.0 in | Wt 232.8 lb

## 2021-05-30 DIAGNOSIS — J988 Other specified respiratory disorders: Secondary | ICD-10-CM | POA: Diagnosis not present

## 2021-05-30 MED ORDER — PREDNISONE 10 MG PO TABS: ORAL_TABLET | ORAL | 0 refills | Status: AC

## 2021-05-30 MED ORDER — BENZONATATE 200 MG PO CAPS: 200.0000 mg | ORAL_CAPSULE | Freq: Three times a day (TID) | ORAL | 0 refills | Status: DC | PRN

## 2021-05-30 MED ORDER — AZITHROMYCIN 250 MG PO TABS
ORAL_TABLET | ORAL | 0 refills | Status: AC
Start: 2021-05-30 — End: 2021-06-04

## 2021-05-30 NOTE — Progress Notes (Signed)
Established Patient Office Visit  Subjective:  Patient ID: Daniel [name], male    DOB: 1951/04/26  Age: 70 y.o. MRN: [id number]  CC:  Chief Complaint  Patient presents with   Nasal Congestion    Patient states he has been having a sore throat coughing and congestion since Sunday morning he had loss his voice that day.he has took some Mucinex.     HPI Daniel [name] presents for nasal congestion  Onset Sunday.  Congestion in lungs, coughing up mucus, sore throat  Home covid test on Monday was negative.   Denies sinus pressure, shob, doe, chest pain, myalgias, fevers, chills, sweats, fatigue, dysphagia.   Mucinex - helping somewhat. OTC cold and flu, somewhat helpful.  Past Medical History:  Diagnosis Date   Allergic rhinitis    Allergy    Anxiety    Arthritis    Benign hypertension    Cataract    Depression    Diabetes mellitus ([place])    type 2    GERD (gastroesophageal reflux disease)    History of pneumonia    HLD (hyperlipidemia)    Neuromuscular disorder ([place])    pt denies at 8/19/visit    Pneumonia    hx of several times per pt - last episode 4-5 years ago     Past Surgical History:  Procedure Laterality Date   CARPAL TUNNEL RELEASE Bilateral 2020 Right, 2021 left   CATARACT EXTRACTION     bilateral   EYE SURGERY     KNEE SURGERY     right   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   Procedure: LEFT TOTAL KNEE ARTHROPLASTY;  Surgeon: Mcarthur Rossetti, MD;  Location: [facility];  Service: Orthopedics;  Laterality: Left;    Family History  Problem Relation Age of Onset   Diabetes Father    Colon polyps Father    Atrial fibrillation Mother    Prostate cancer Paternal Uncle    Cancer Sister    Esophageal cancer Neg Hx    Rectal cancer Neg Hx    Stomach cancer Neg Hx     Social History   Socioeconomic History   Marital status: Married    Spouse name: Not on file   Number of children: 3   Years of education: 16   Highest education level: Not  on file  Occupational History   Occupation: Dealer, Company secretary  Tobacco Use   Smoking status: Never   Smokeless tobacco: Never  Vaping Use   Vaping Use: Never used  Substance and Sexual Activity   Alcohol use: No   Drug use: No   Sexual activity: Not Currently  Other Topics Concern   Not on file  Social History Narrative   Not on file   Social Determinants of Health   Financial Resource Strain: Low Risk    Difficulty of Paying Living Expenses: Not hard at all  Food Insecurity: No Food Insecurity   Worried About Charity fundraiser in the Last Year: Never true   [place] in the Last Year: Never true  Transportation Needs: No Transportation Needs   Lack of Transportation (Medical): No   Lack of Transportation (Non-Medical): No  Physical Activity: Sufficiently Active   Days of Exercise per Week: 5 days   Minutes of Exercise per Session: 40 min  Stress: No Stress Concern Present   Feeling of Stress : Not at all  Social Connections: Socially Integrated   Frequency of Communication with Friends and  Family: More than three times a week   Frequency of Social Gatherings with Friends and Family: More than three times a week   Attends Religious Services: More than 4 times per year   Active Member of [employer] or Organizations: Yes   Attends Archivist Meetings: 1 to 4 times per year   Marital Status: Married  Human resources officer Violence: Not At Risk   Fear of Current or Ex-Partner: No   Emotionally Abused: No   Physically Abused: No   Sexually Abused: No    Outpatient Medications Prior to Visit  Medication Sig Dispense Refill   albuterol (VENTOLIN HFA) 108 (90 Base) MCG/ACT inhaler Inhale 2 puffs into the lungs every 6 (six) hours as needed for wheezing or shortness of breath. 8 g 0   aspirin EC 325 MG EC tablet Take 1 tablet (325 mg total) by mouth 2 (two) times daily after a meal. (Patient taking differently: Take 81 mg by mouth 2 (two) times daily after a  meal.) 30 tablet 0   blood glucose meter kit and supplies 1 each by Other route as directed. Dispense based on patient and insurance preference. Check once per day (variable times).  Accu check. 1 each 0   cetirizine (ZYRTEC) 10 MG tablet Take 10 mg by mouth daily as needed for allergies.      Cholecalciferol (VITAMIN D) 125 MCG (5000 UT) CAPS Take 5,000 Units by mouth daily.     Cyanocobalamin (VITAMIN B 12 PO) Take 1,000 mg by mouth daily.      cyclobenzaprine (FLEXERIL) 10 MG tablet Take 1 tablet (10 mg total) by mouth 3 (three) times daily as needed for muscle spasms. 30 tablet 0   diclofenac Sodium (VOLTAREN) 1 % GEL Apply 2 g topically daily.     fluticasone (FLONASE) 50 MCG/ACT nasal spray Place 1 spray into both nostrils daily.     gabapentin (NEURONTIN) 300 MG capsule Take 1-2 capsules (300-600 mg total) by mouth 2 (two) times daily as needed. 180 capsule 3   glimepiride (AMARYL) 4 MG tablet Take 1 tablet by mouth twice daily 180 tablet 0   hydrochlorothiazide (MICROZIDE) 12.5 MG capsule Take 1 capsule by mouth once daily 90 capsule 1   ibuprofen (ADVIL,MOTRIN) 200 MG tablet Take 400 mg by mouth in the morning and at bedtime.      Lancets (ONETOUCH DELICA PLUS [id number]) MISC USE 1 TO CHECK GLUCOSE ONCE DAILY     liraglutide (VICTOZA) 18 MG/3ML SOPN Inject 1.8 mg into the skin daily. I 3 mL 5   lisinopril (ZESTRIL) 20 MG tablet Take 1 tablet (20 mg total) by mouth daily. 90 tablet 3   metFORMIN (GLUCOPHAGE) 1000 MG tablet TAKE 1 TABLET BY MOUTH TWICE DAILY WITH A MEAL 180 tablet 1   montelukast (SINGULAIR) 10 MG tablet TAKE 1 TABLET BY MOUTH AT BEDTIME 90 tablet 0   Multiple Vitamin (MULTIVITAMIN WITH MINERALS) TABS tablet Take 1 tablet by mouth daily.     omeprazole (PRILOSEC) 20 MG capsule Take 1 capsule by mouth once daily 90 capsule 0   ondansetron (ZOFRAN ODT) 4 MG disintegrating tablet Take 1 tablet (4 mg total) by mouth every 8 (eight) hours as needed for nausea or vomiting. 20  tablet 0   ONETOUCH ULTRA test strip USE 1 STRIP TO CHECK GLUCOSE ONCE DAILY 100 each 1   pravastatin (PRAVACHOL) 80 MG tablet Take 1 tablet (80 mg total) by mouth daily. 90 tablet 1   sertraline (ZOLOFT) 100  MG tablet START WITH 1 TABLET BY MOUTH ONCE DAILY FOR 2 WEEKS, THEN INCREASE TO 2 TABLET DAILY. 180 tablet 0   sodium chloride (OCEAN) 0.65 % SOLN nasal spray Place 1 spray into both nostrils as needed for congestion.     traMADol (ULTRAM) 50 MG tablet TAKE 1 TABLET BY MOUTH EVERY 6 HOURS AS NEEDED 120 tablet 0   vitamin E 180 MG (400 UNITS) capsule Take 400 Units by mouth daily.     predniSONE (DELTASONE) 10 MG tablet Take 1 tablet (10 mg total) by mouth daily with breakfast. 5 tablet 0   No facility-administered medications prior to visit.    Allergies  Allergen Reactions   Actos [Pioglitazone] Other (See Comments), Shortness Of Breath and Swelling    Blood sugar went up and down and sweating profusely   Codeine Shortness Of Breath and Palpitations    ROS Review of Systems  Constitutional: Negative.   HENT:  Positive for congestion and sore throat.   Eyes: Negative.   Respiratory:  Positive for cough.   Cardiovascular: Negative.   Gastrointestinal: Negative.   Genitourinary: Negative.   Musculoskeletal: Negative.   Skin: Negative.   Neurological: Negative.   Psychiatric/Behavioral: Negative.    All other systems reviewed and are negative.    Objective:    Physical Exam Constitutional:      General: He is not in acute distress.    Appearance: Normal appearance. He is normal weight. He is not ill-appearing, toxic-appearing or diaphoretic.  Cardiovascular:     Rate and Rhythm: Normal rate and regular rhythm.     Heart sounds: Normal heart sounds. No murmur heard.   No friction rub. No gallop.  Pulmonary:     Effort: Pulmonary effort is normal. No respiratory distress.     Breath sounds: No stridor. Wheezing (throughout) present. No rhonchi or rales.  Chest:      Chest wall: No tenderness.  Neurological:     General: No focal deficit present.     Mental Status: He is alert and oriented to person, place, and time. Mental status is at baseline.  Psychiatric:        Mood and Affect: Mood normal.        Behavior: Behavior normal.        Thought Content: Thought content normal.        Judgment: Judgment normal.    BP (!) 152/73   Pulse 72   Temp 98.3 F (36.8 C) (Temporal)   Resp 18   Ht _0  (1.727 m)   Wt 232 lb 12.8 oz (105.6 kg)   SpO2 99%   BMI 35.40 kg/m  Wt Readings from Last 3 Encounters:  05/30/21 232 lb 12.8 oz (105.6 kg)  11/10/20 241 lb 9.6 oz (109.6 kg)  10/18/20 245 lb 9.6 oz (111.4 kg)     Health Maintenance Due  Topic Date Due   Zoster Vaccines- Shingrix (1 of 2) Never done   COLONOSCOPY (Pts 45-64yr Insurance coverage will need to be confirmed)  10/12/2019   FOOT EXAM  04/25/2021   OPHTHALMOLOGY EXAM  05/09/2021   HEMOGLOBIN A1C  05/13/2021    There are no preventive care reminders to display for this patient.  Lab Results  Component Value Date   TSH 2.170 04/08/2017   Lab Results  Component Value Date   WBC 8.6 05/30/2020   HGB 11.5 (L) 05/30/2020   HCT 35.3 (L) 05/30/2020   MCV 87 05/30/2020   PLT 373 05/30/2020  Lab Results  Component Value Date   NA 137 11/10/2020   K 4.5 11/10/2020   CO2 27 11/10/2020   GLUCOSE 94 11/10/2020   BUN 28 (H) 11/10/2020   CREATININE 1.28 11/10/2020   BILITOT <0.2 07/27/2020   ALKPHOS 92 07/27/2020   AST 15 07/27/2020   ALT 14 07/27/2020   PROT 6.8 07/27/2020   ALBUMIN 4.1 07/27/2020   CALCIUM 9.1 11/10/2020   ANIONGAP 11 02/27/2020   EGFR 67 09/02/2020   GFR 56.16 (L) 11/10/2020   Lab Results  Component Value Date   CHOL 241 (H) 07/27/2020   Lab Results  Component Value Date   HDL 57 07/27/2020   Lab Results  Component Value Date   LDLCALC 153 (H) 07/27/2020   Lab Results  Component Value Date   TRIG 174 (H) 07/27/2020   Lab Results   Component Value Date   CHOLHDL 4.2 07/27/2020   Lab Results  Component Value Date   HGBA1C 9.1 Repeated and verified X2. (H) 11/10/2020      Assessment & Plan:   Problem List Items Addressed This Visit   None Visit Diagnoses     Respiratory infection    -  Primary   Relevant Medications   benzonatate (TESSALON) 200 MG capsule   predniSONE (DELTASONE) 10 MG tablet   azithromycin (ZITHROMAX) 250 MG tablet       Meds ordered this encounter  Medications   benzonatate (TESSALON) 200 MG capsule    Sig: Take 1 capsule (200 mg total) by mouth 3 (three) times daily as needed for cough.    Dispense:  20 capsule    Refill:  0    Order Specific Question:   Supervising Provider    Answer:   Carlota Raspberry, JEFFREY R [[id number]]   predniSONE (DELTASONE) 10 MG tablet    Sig: Take 2 tablets (20 mg total) by mouth daily with breakfast for 2 days, THEN 1 tablet (10 mg total) daily with breakfast for 2 days, THEN 0.5 tablets (5 mg total) daily with breakfast for 2 days.    Dispense:  7 tablet    Refill:  0    Order Specific Question:   Supervising Provider    Answer:   Carlota Raspberry, JEFFREY R [[id number]]   azithromycin (ZITHROMAX) 250 MG tablet    Sig: Take 2 tablets on day 1, then 1 tablet daily on days 2 through 5    Dispense:  6 tablet    Refill:  0    Order Specific Question:   Supervising Provider    Answer:   Carlota Raspberry, JEFFREY R [[id number]]    Follow-up: Return if symptoms worsen or fail to improve.   PLAN Continue mucinex and albuterol inhaler.  Start tessalon and prednisone. If no improve by 72 hours, can start z pack.  Recommend repeat home covid test within 48 hours. Can consider antivirals if positive. ER precautions reviewed. Pt voices understanding. Patient encouraged to call clinic with any questions, comments, or concerns.  Maximiano Coss, NP

## 2021-05-30 NOTE — Patient Instructions (Addendum)
[name] -   Doristine Devoid to see you  I think this is a virus - aggressive symptom control should resolve this.   I have sent prednisone and tessalon. Keep close eye on sugars with the prednisone. Can stop taking it if sugars too high.   Continue albuterol and mucinex.   Repeat home covid test within 48 hours if symptoms persist, or especially if anyone you were with on Thanksgiving tests positive.  Call me if no improvement in 72 hours.  If anything takes a quick turn for the worse, seek ER or Urgent Care.  Thank you  [name]     If you have lab work done today you will be contacted with your lab results within the next 2 weeks.  If you have not heard from [place] then please contact us. The fastest way to get your results is to register for My Chart.   IF you received an x-ray today, you will receive an invoice from [facility]. Please contact [facility] at [phone] with questions or concerns regarding your invoice.   IF you received labwork today, you will receive an invoice from [facility]. Please contact [facility] at [phone] with questions or concerns regarding your invoice.   Our billing staff will not be able to assist you with questions regarding bills from these companies.  You will be contacted with the lab results as soon as they are available. The fastest way to get your results is to activate your My Chart account. Instructions are located on the last page of this paperwork. If you have not heard from [place] regarding the results in 2 weeks, please contact this office.

## 2021-06-04 ENCOUNTER — Encounter: Payer: Self-pay | Admitting: Registered Nurse

## 2021-06-05 NOTE — Telephone Encounter (Signed)
I think he should take the z pack  Thanks,  Rich

## 2021-06-07 ENCOUNTER — Other Ambulatory Visit: Payer: Self-pay | Admitting: Registered Nurse

## 2021-06-07 DIAGNOSIS — J4520 Mild intermittent asthma, uncomplicated: Secondary | ICD-10-CM

## 2021-06-21 ENCOUNTER — Other Ambulatory Visit: Payer: Self-pay | Admitting: Family Medicine

## 2021-06-21 DIAGNOSIS — R062 Wheezing: Secondary | ICD-10-CM

## 2021-06-21 DIAGNOSIS — G8929 Other chronic pain: Secondary | ICD-10-CM

## 2021-06-21 DIAGNOSIS — E1142 Type 2 diabetes mellitus with diabetic polyneuropathy: Secondary | ICD-10-CM

## 2021-06-21 NOTE — Telephone Encounter (Signed)
Controlled substance database (PDMP) reviewed. No concerns appreciated.  Last filled tramadol 1029.  He is due for follow-up.  I will refill medications temporarily, but please schedule follow-up in the next 4 to 6 weeks if possible.  Due for repeat A1c and discussion of other medications.

## 2021-07-25 ENCOUNTER — Other Ambulatory Visit: Payer: Self-pay | Admitting: Family Medicine

## 2021-07-25 DIAGNOSIS — M25562 Pain in left knee: Secondary | ICD-10-CM

## 2021-07-25 NOTE — Telephone Encounter (Signed)
Pt is scheduled for an appt on 08/30/21

## 2021-07-25 NOTE — Telephone Encounter (Signed)
Controlled substance database (PDMP) reviewed. No concerns appreciated.  Last filled 06/21/2021.  Refill ordered, but he is overdue for follow-up for labs and can discuss medication at that time.  Please schedule visit in the next 4 to 6 weeks if possible.

## 2021-08-30 ENCOUNTER — Ambulatory Visit (INDEPENDENT_AMBULATORY_CARE_PROVIDER_SITE_OTHER): Payer: Medicare Other | Admitting: Family Medicine

## 2021-08-30 VITALS — BP 138/76 | HR 72 | Temp 97.6°F | Resp 17 | Ht 68.0 in | Wt 232.6 lb

## 2021-08-30 DIAGNOSIS — M25561 Pain in right knee: Secondary | ICD-10-CM | POA: Diagnosis not present

## 2021-08-30 DIAGNOSIS — K219 Gastro-esophageal reflux disease without esophagitis: Secondary | ICD-10-CM

## 2021-08-30 DIAGNOSIS — E7800 Pure hypercholesterolemia, unspecified: Secondary | ICD-10-CM

## 2021-08-30 DIAGNOSIS — E1165 Type 2 diabetes mellitus with hyperglycemia: Secondary | ICD-10-CM | POA: Diagnosis not present

## 2021-08-30 DIAGNOSIS — F32A Depression, unspecified: Secondary | ICD-10-CM

## 2021-08-30 DIAGNOSIS — G8929 Other chronic pain: Secondary | ICD-10-CM

## 2021-08-30 DIAGNOSIS — E1142 Type 2 diabetes mellitus with diabetic polyneuropathy: Secondary | ICD-10-CM

## 2021-08-30 DIAGNOSIS — J4520 Mild intermittent asthma, uncomplicated: Secondary | ICD-10-CM

## 2021-08-30 DIAGNOSIS — M25562 Pain in left knee: Secondary | ICD-10-CM

## 2021-08-30 DIAGNOSIS — I10 Essential (primary) hypertension: Secondary | ICD-10-CM

## 2021-08-30 LAB — COMPREHENSIVE METABOLIC PANEL
ALT: 9 U/L (ref 0–53)
AST: 12 U/L (ref 0–37)
Albumin: 4.2 g/dL (ref 3.5–5.2)
Alkaline Phosphatase: 80 U/L (ref 39–117)
BUN: 24 mg/dL — ABNORMAL HIGH (ref 6–23)
CO2: 29 mEq/L (ref 19–32)
Calcium: 9.6 mg/dL (ref 8.4–10.5)
Chloride: 99 mEq/L (ref 96–112)
Creatinine, Ser: 1.34 mg/dL (ref 0.40–1.50)
GFR: 52.85 mL/min — ABNORMAL LOW (ref >60.00)
Glucose, Bld: 168 mg/dL — ABNORMAL HIGH (ref 70–99)
Potassium: 5.3 mEq/L — ABNORMAL HIGH (ref 3.5–5.1)
Sodium: 135 mEq/L (ref 135–145)
Total Bilirubin: 0.4 mg/dL (ref 0.2–1.2)
Total Protein: 7 g/dL (ref 6.0–8.3)

## 2021-08-30 LAB — LIPID PANEL
Cholesterol: 233 mg/dL — ABNORMAL HIGH (ref 0–200)
HDL: 54.7 mg/dL (ref >39.00)
LDL Cholesterol: 148 mg/dL — ABNORMAL HIGH (ref 0–99)
NonHDL: 178.16
Total CHOL/HDL Ratio: 4
Triglycerides: 152 mg/dL — ABNORMAL HIGH (ref 0.0–149.0)
VLDL: 30.4 mg/dL (ref 0.0–40.0)

## 2021-08-30 LAB — HEMOGLOBIN A1C: Hgb A1c MFr Bld: 9.4 % — ABNORMAL HIGH (ref 4.6–6.5)

## 2021-08-30 MED ORDER — LISINOPRIL 20 MG PO TABS: 20.0000 mg | ORAL_TABLET | Freq: Every day | ORAL | 3 refills | Status: DC

## 2021-08-30 MED ORDER — PRAVASTATIN SODIUM 80 MG PO TABS: 80.0000 mg | ORAL_TABLET | Freq: Every day | ORAL | 1 refills | Status: DC

## 2021-08-30 MED ORDER — OMEPRAZOLE 20 MG PO CPDR: 20.0000 mg | DELAYED_RELEASE_CAPSULE | Freq: Every day | ORAL | 1 refills | Status: DC

## 2021-08-30 MED ORDER — LIRAGLUTIDE 18 MG/3ML ~~LOC~~ SOPN: 1.8000 mg | PEN_INJECTOR | Freq: Every day | SUBCUTANEOUS | 5 refills | Status: DC

## 2021-08-30 MED ORDER — TRAMADOL HCL 50 MG PO TABS: 50.0000 mg | ORAL_TABLET | Freq: Four times a day (QID) | ORAL | 1 refills | Status: DC | PRN

## 2021-08-30 MED ORDER — MONTELUKAST SODIUM 10 MG PO TABS: 10.0000 mg | ORAL_TABLET | Freq: Every day | ORAL | 2 refills | Status: DC

## 2021-08-30 MED ORDER — SERTRALINE HCL 100 MG PO TABS: ORAL_TABLET | ORAL | 1 refills | Status: DC

## 2021-08-30 MED ORDER — METFORMIN HCL 1000 MG PO TABS: 1000.0000 mg | ORAL_TABLET | Freq: Two times a day (BID) | ORAL | 1 refills | Status: DC

## 2021-08-30 MED ORDER — GLIMEPIRIDE 4 MG PO TABS: 4.0000 mg | ORAL_TABLET | Freq: Two times a day (BID) | ORAL | 1 refills | Status: DC

## 2021-08-30 MED ORDER — HYDROCHLOROTHIAZIDE 12.5 MG PO CAPS: 12.5000 mg | ORAL_CAPSULE | Freq: Every day | ORAL | 1 refills | Status: DC

## 2021-08-30 MED ORDER — GABAPENTIN 300 MG PO CAPS: ORAL_CAPSULE | ORAL | 1 refills | Status: DC

## 2021-08-30 NOTE — Patient Instructions (Addendum)
You are overdue for colonoscopy, please discuss with Dr. Havery Moros to schedule. ?Call eye specialist for appointment and make sure they send me a report.  ?Restart victoza. Start at 0.6mg  per day for 1 week, then 1.2mg  per day for 1 week, then up to your prior dose of 1.8mg  per day. Send me updated readings (fasting and 2 hours after a meal) in next 3-4 weeks to decide on changes at that time. No change in other meds at this time.  ?Thanks for coming in today.  ?Return to the clinic or go to the nearest emergency room if any of your symptoms worsen or new symptoms occur. ? ? ? ? ? ? ? ? ? ? ?

## 2021-08-30 NOTE — Progress Notes (Signed)
Subjective:  Patient ID: Daniel [name], male    DOB: [birth year]  Age: 71 y.o. MRN: [id number]  CC:  Chief Complaint  Patient presents with   Hyperlipidemia    Pt due for lab check    Diabetes    Pt has been monitoring bg has not been taking victoza in apt 3 months morning fasting BG 140-190   Knee Pain    Pt had chronic knee pain and uses tramadol for relief, no concerns recent refill given    Allergies    Does need refill montelukast no concrns with this med     HPI Daniel [name] presents for   Diabetes: With hyperglycemia, diabetic polyneuropathy.  Uncontrolled on labs in May 2022 when seen for other acute issues at that time.  He had been using his glipizide in place of Victoza as it had not been refrigerated when he was out of town.  Some erratic readings at that time.  He had to restart his Victoza, continued on metformin Amaryl, plan for close follow-up within a month or 2 at that time.  Has not been seen for diabetes since May 2022. Denies barriers to care. Off victoza for 3 months - was not refilled as due for follow up. Cost issue at end of year, ok now. No side effects on meds.  Gabapentin 600 mg daily, rare extra dose.  He does take pravastatin for statin. lisinopril for ACE inhibitor. Home readings 140-180 fasting.  Postprandial: rare - 20 points higher.  Microalbumin: Normal ratio 01/25/2020 No symptomatic lows.  Optho, foot exam, pneumovax:  Ophthalmology: over a year. [employer].  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  Decreased ibuprofen - less constipation. Diabetic Foot Exam - Simple   Simple Foot Form Visual Inspection No deformities, no ulcerations, no other skin breakdown bilaterally: Yes Sensation Testing See comments: Yes Pulse Check Posterior Tibialis and Dorsalis pulse intact bilaterally: Yes Comments Decreased sensation 2-4th toes bilat, metatarsal area medially bilaterally.    Also due for colonoscopy.  Last one in April 2018.  Multiple  polyps.  3-year recheck recommended due to adenomatous polyps.  Dr. Havery [name]. Has appt in few weeks.   Lab Results  Component Value Date   HGBA1C 9.1 Repeated and verified X2. (H) 11/10/2020   HGBA1C 8.7 (H) 07/27/2020   HGBA1C 7.0 (A) 04/25/2020   Lab Results  Component Value Date   MICROALBUR 15.1 05/03/2015   LDLCALC 153 (H) 07/27/2020   CREATININE 1.28 11/10/2020   Mild intermittent asthma Needs refills of Singulair for allergies, mild intermittent asthma which has been working well.  Has albuterol if needed - none needed recently.   Hypertension: HCTZ 12.5 mg daily, lisinopril 20 mg daily. Home readings: BP Readings from Last 3 Encounters:  08/30/21 138/76  05/30/21 (!) 152/73  11/10/20 134/66   Lab Results  Component Value Date   CREATININE 1.28 11/10/2020   Hyperlipidemia: Pravastatin 80 mg daily.  Due for updated labs.  No new side effects on medication.  Taking about 6 days per week, rare missed dose.  Lab Results  Component Value Date   CHOL 241 (H) 07/27/2020   HDL 57 07/27/2020   LDLCALC 153 (H) 07/27/2020   TRIG 174 (H) 07/27/2020   CHOLHDL 4.2 07/27/2020   Lab Results  Component Value Date   ALT 14 07/27/2020   AST 15 07/27/2020   ALKPHOS 92 07/27/2020   BILITOT <0.2 07/27/2020   Chronic knee pain History of left total  knee replacement, still right knee pain.  Eventually plan for surgery but when last discussed in March was delaying surgery for about a year as his spouse was having surgery soon at that time.  Takes tramadol to manage pain 1-2 morning, 1 at night. Usually 2-3 per day works well. Sometimes just 1 per day. R knee overall doing well. Plans to defer surgery for now.  Controlled substance database (PDMP) reviewed. No concerns appreciated.  Last filled for number 120 on 07/25/2021, previous prescription 06/21/2021, previously 04/29/2021.  Depression: Well controlled on Zoloft once daily.   Depression screen [facility] 2/9 08/30/2021 12/26/2020  11/10/2020 09/02/2020 07/27/2020  Decreased Interest 0 0 0 0 0  Down, Depressed, Hopeless 0 0 0 0 0  PHQ - 2 Score 0 0 0 0 0    History 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   Trigger finger, left little finger 02/04/2020   Carpal tunnel syndrome, left upper limb 11/24/2019   Unilateral primary osteoarthritis, left knee 11/02/2019   Unilateral primary osteoarthritis, right knee 11/02/2019   Carpal tunnel syndrome, right upper limb 02/18/2018   Anxiety and depression 10/28/2016   Gastroesophageal reflux disease without esophagitis 10/28/2016   Seasonal allergic rhinitis due to pollen 10/28/2016   Primary osteoarthritis of left knee 07/25/2015   Essential hypertension 07/25/2015   Hypercholesteremia 07/25/2015   Type 2 diabetes mellitus with diabetic polyneuropathy, without long-term current use of insulin ([place]) 07/25/2015   Pollen allergies 02/10/2014   Allergic rhinitis due to animal (cat) (dog) hair and dander 09/15/2012   Extrinsic asthma 09/13/2011   Proteinuria 11/24/2010   Past Medical History:  Diagnosis Date   Allergic rhinitis    Allergy    Anxiety    Arthritis    Benign hypertension    Cataract    Depression    Diabetes mellitus ([place])    type 2    GERD (gastroesophageal reflux disease)    History of pneumonia    HLD (hyperlipidemia)    Neuromuscular disorder ([place])    pt denies at 8/19/visit    Pneumonia    hx of several times per pt - last episode 4-5 years ago    Past Surgical History:  Procedure Laterality Date   CARPAL TUNNEL RELEASE Bilateral 2020 Right, 2021 left   CATARACT EXTRACTION     bilateral   EYE SURGERY     KNEE SURGERY     right   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   Procedure: LEFT TOTAL KNEE ARTHROPLASTY;  Surgeon: Mcarthur Rossetti, MD;  Location: [facility];  Service: Orthopedics;  Laterality: Left;   Allergies  Allergen Reactions   Actos [Pioglitazone] Other (See  Comments), Shortness Of Breath and Swelling    Blood sugar went up and down and sweating profusely   Codeine Shortness Of Breath and Palpitations   Prior to Admission medications   Medication Sig Start Date End Date Taking? Authorizing Provider  albuterol (VENTOLIN HFA) 108 (90 Base) MCG/ACT inhaler INHALE 2 PUFFS BY MOUTH EVERY 6 HOURS AS NEEDED FOR WHEEZING OR SHORTNESS OF BREATH 06/08/21   Maximiano Coss, NP  aspirin EC 325 MG EC tablet Take 1 tablet (325 mg total) by mouth 2 (two) times daily after a meal. Patient taking differently: Take 81 mg by mouth 2 (two) times daily after a meal. 02/27/20   Mcarthur Rossetti, MD  blood glucose meter kit and supplies 1 each by Other route as  directed. Dispense based on patient and insurance preference. Check once per day (variable times).  Accu check. 07/29/18   Wendie Agreste, MD  cetirizine (ZYRTEC) 10 MG tablet Take 10 mg by mouth daily as needed for allergies.     [provider]  Cholecalciferol (VITAMIN D) 125 MCG (5000 UT) CAPS Take 5,000 Units by mouth daily.    [provider]  Cyanocobalamin (VITAMIN B 12 PO) Take 1,000 mg by mouth daily.     [provider]  cyclobenzaprine (FLEXERIL) 10 MG tablet Take 1 tablet (10 mg total) by mouth 3 (three) times daily as needed for muscle spasms. 10/26/20   Maximiano Coss, NP  diclofenac Sodium (VOLTAREN) 1 % GEL Apply 2 g topically daily.    [provider]  fluticasone (FLONASE) 50 MCG/ACT nasal spray Place 1 spray into both nostrils daily.    [provider]  gabapentin (NEURONTIN) 300 MG capsule TAKE 1 TO 2 CAPSULES BY MOUTH TWICE DAILY AS NEEDED 06/21/21   Wendie Agreste, MD  glimepiride (AMARYL) 4 MG tablet Take 1 tablet by mouth twice daily 06/21/21   Wendie Agreste, MD  hydrochlorothiazide (MICROZIDE) 12.5 MG capsule Take 1 capsule by mouth once daily 10/12/20   Wendie Agreste, MD  ibuprofen (ADVIL,MOTRIN) 200 MG tablet Take 400 mg by  mouth in the morning and at bedtime.     [provider]  Lancets (ONETOUCH DELICA PLUS XLKGMW10U) [place] USE 1 TO CHECK GLUCOSE ONCE DAILY 08/04/18   [provider]  liraglutide (VICTOZA) 18 MG/3ML SOPN Inject 1.8 mg into the skin daily. I 07/27/20   Wendie Agreste, MD  lisinopril (ZESTRIL) 20 MG tablet Take 1 tablet (20 mg total) by mouth daily. 07/27/20   Wendie Agreste, MD  metFORMIN (GLUCOPHAGE) 1000 MG tablet TAKE 1 TABLET BY MOUTH TWICE DAILY WITH A MEAL 06/21/21   Wendie Agreste, MD  montelukast (SINGULAIR) 10 MG tablet TAKE 1 TABLET BY MOUTH AT BEDTIME 06/21/21   Wendie Agreste, MD  Multiple Vitamin (MULTIVITAMIN WITH MINERALS) TABS tablet Take 1 tablet by mouth daily.    [provider]  omeprazole (PRILOSEC) 20 MG capsule Take 1 capsule by mouth once daily 05/04/21   Wendie Agreste, MD  ondansetron (ZOFRAN ODT) 4 MG disintegrating tablet Take 1 tablet (4 mg total) by mouth every 8 (eight) hours as needed for nausea or vomiting. 02/27/20   Mcarthur Rossetti, MD  [facility] ULTRA test strip USE 1 STRIP TO CHECK GLUCOSE ONCE DAILY 11/25/18   Wendie Agreste, MD  pravastatin (PRAVACHOL) 80 MG tablet Take 1 tablet (80 mg total) by mouth daily. 07/27/20   Wendie Agreste, MD  sertraline (ZOLOFT) 100 MG tablet START WITH 1 TABLET BY MOUTH ONCE DAILY FOR 2 WEEKS, THEN INCREASE TO 2 TABLET DAILY. 09/15/20   Wendie Agreste, MD  sodium chloride (OCEAN) 0.65 % SOLN nasal spray Place 1 spray into both nostrils as needed for congestion.    [provider]  traMADol (ULTRAM) 50 MG tablet TAKE 1 TABLET BY MOUTH EVERY 6 HOURS AS NEEDED 07/25/21   Wendie Agreste, MD  vitamin E 180 MG (400 UNITS) capsule Take 400 Units by mouth daily.    [provider]   Social History   Socioeconomic History   Marital status: Married    Spouse name: Not on file   Number of children: 3   Years of education: 16   Highest education level:  Not on file   Occupational History   Occupation: Dealer, Company secretary  Tobacco Use   Smoking status: Never   Smokeless tobacco: Never  Vaping Use   Vaping Use: Never used  Substance and Sexual Activity   Alcohol use: No   Drug use: No   Sexual activity: Not Currently  Other Topics Concern   Not on file  Social History Narrative   Not on file   Social Determinants of Health   Financial Resource Strain: Low Risk    Difficulty of Paying Living Expenses: Not hard at all  Food Insecurity: No Food Insecurity   Worried About Charity fundraiser in the Last Year: Never true   [place] in the Last Year: Never true  Transportation Needs: No Transportation Needs   Lack of Transportation (Medical): No   Lack of Transportation (Non-Medical): No  Physical Activity: Sufficiently Active   Days of Exercise per Week: 5 days   Minutes of Exercise per Session: 40 min  Stress: No Stress Concern Present   Feeling of Stress : Not at all  Social Connections: Socially Integrated   Frequency of Communication with Friends and Family: More than three times a week   Frequency of Social Gatherings with Friends and Family: More than three times a week   Attends Religious Services: More than 4 times per year   Active Member of [employer] or Organizations: Yes   Attends Archivist Meetings: 1 to 4 times per year   Marital Status: Married  Human resources officer Violence: Not At Risk   Fear of Current or Ex-Partner: No   Emotionally Abused: No   Physically Abused: No   Sexually Abused: No    Review of Systems  Constitutional:  Negative for fatigue and unexpected weight change.  Eyes:  Negative for visual disturbance.  Respiratory:  Negative for cough, chest tightness and shortness of breath.   Cardiovascular:  Negative for chest pain, palpitations and leg swelling.  Gastrointestinal:  Negative for abdominal pain and blood in stool.  Neurological:  Negative for dizziness, light-headedness and  headaches.    Objective:   Vitals:   08/30/21 0852  BP: 138/76  Pulse: 72  Resp: 17  Temp: 97.6 F (36.4 C)  TempSrc: Temporal  SpO2: 96%  Weight: 232 lb 9.6 oz (105.5 kg)  Height: '5\' 8"'  (1.727 m)     Physical Exam Vitals reviewed.  Constitutional:      Appearance: He is well-developed.  HENT:     Head: Normocephalic and atraumatic.  Neck:     Vascular: No carotid bruit or JVD.  Cardiovascular:     Rate and Rhythm: Normal rate and regular rhythm.     Heart sounds: Normal heart sounds. No murmur heard. Pulmonary:     Effort: Pulmonary effort is normal.     Breath sounds: Normal breath sounds. No rales.  Musculoskeletal:     Right lower leg: No edema.     Left lower leg: No edema.     Comments: Pain free knee ROM on R, no focal bony ttp. Well healed scar L knee.   Skin:    General: Skin is warm and dry.  Neurological:     Mental Status: He is alert and oriented to person, place, and time.  Psychiatric:        Mood and Affect: Mood normal.        Behavior: Behavior normal.    62 minutes spent during visit, including chart review,  counseling and assimilation of information, exam, discussion of plan for multiple issues and overdue health maintenance, foot exam, med reconciliation and chart completion.     Assessment & Plan:  KIMON LOEWEN is a 71 y.o. male . Type 2 diabetes mellitus with diabetic polyneuropathy, without long-term current use of insulin (HCC) - Plan: metFORMIN (GLUCOPHAGE) 1000 MG tablet, glimepiride (AMARYL) 4 MG tablet, gabapentin (NEURONTIN) 300 MG capsule, Hemoglobin A1c, Comprehensive metabolic panel, liraglutide (VICTOZA) 18 MG/3ML SOPN Uncontrolled type 2 diabetes mellitus with hyperglycemia (HCC)  -Check labs.  Anticipate elevated A1c as off Victoza.  Restart Victoza with titration up towards previous dose.  Updated home readings requested in the next 3 to 4 weeks.  Continue other meds same dose for now.  Hypercholesteremia - Plan: Lipid  panel, Comprehensive metabolic panel, pravastatin (PRAVACHOL) 80 MG tablet  -Continue same dose pravastatin, check labs.  Chronic pain of both knees - Plan: traMADol (ULTRAM) 50 MG tablet  -Stable control with tramadol as above, defers surgical treatment at this time.  We will continue same regimen with tramadol, 17-monthfollow-up intervals.  Mild intermittent asthma without complication - Plan: montelukast (SINGULAIR) 10 MG tablet  -Stable, continue Singulair.  Essential hypertension - Plan: hydrochlorothiazide (MICROZIDE) 12.5 MG capsule, lisinopril (ZESTRIL) 20 MG tablet  -Stable, continue same regimen.  Check labs.  Depression, unspecified depression type - Plan: sertraline (ZOLOFT) 100 MG tablet  -Well-controlled current dose, continue same.  Gastroesophageal reflux disease, unspecified whether esophagitis present - Plan: omeprazole (PRILOSEC) 20 MG capsule  -Stable with dosing as above.  Continue omeprazole.  Health maintenance discussed with updated retinopathy screening and colonoscopy planned.  Meds ordered this encounter  Medications   traMADol (ULTRAM) 50 MG tablet    Sig: Take 1 tablet (50 mg total) by mouth every 6 (six) hours as needed.    Dispense:  120 tablet    Refill:  1   montelukast (SINGULAIR) 10 MG tablet    Sig: Take 1 tablet (10 mg total) by mouth at bedtime.    Dispense:  90 tablet    Refill:  2   metFORMIN (GLUCOPHAGE) 1000 MG tablet    Sig: Take 1 tablet (1,000 mg total) by mouth 2 (two) times daily with a meal.    Dispense:  180 tablet    Refill:  1   glimepiride (AMARYL) 4 MG tablet    Sig: Take 1 tablet (4 mg total) by mouth 2 (two) times daily.    Dispense:  180 tablet    Refill:  1   gabapentin (NEURONTIN) 300 MG capsule    Sig: TAKE 1 TO 2 CAPSULES BY MOUTH TWICE DAILY AS NEEDED    Dispense:  180 capsule    Refill:  1   hydrochlorothiazide (MICROZIDE) 12.5 MG capsule    Sig: Take 1 capsule (12.5 mg total) by mouth daily.    Dispense:  90  capsule    Refill:  1   sertraline (ZOLOFT) 100 MG tablet    Sig: 1 tablet daily.    Dispense:  90 tablet    Refill:  1   liraglutide (VICTOZA) 18 MG/3ML SOPN    Sig: Inject 1.8 mg into the skin daily. Start 0.681mqd for 1 week, then 1.45m58mD for 1 week, then 1.8mg5m.    Dispense:  3 mL    Refill:  5   omeprazole (PRILOSEC) 20 MG capsule    Sig: Take 1 capsule (20 mg total) by mouth daily.    Dispense:  90 capsule    Refill:  1   lisinopril (ZESTRIL) 20 MG tablet    Sig: Take 1 tablet (20 mg total) by mouth daily.    Dispense:  90 tablet    Refill:  3   pravastatin (PRAVACHOL) 80 MG tablet    Sig: Take 1 tablet (80 mg total) by mouth daily.    Dispense:  90 tablet    Refill:  1   Patient Instructions  You are overdue for colonoscopy, please discuss with Dr. Havery [name] to schedule. Call eye specialist for appointment and make sure they send me a report.  Restart victoza. Start at 0.19m per day for 1 week, then 1.230mper day for 1 week, then up to your prior dose of 1.19m31mer day. Send me updated readings (fasting and 2 hours after a meal) in next 3-4 weeks to decide on changes at that time. No change in other meds at this time.  Thanks for coming in today.  Return to the clinic or go to the nearest emergency room if any of your symptoms worsen or new symptoms occur.             Signed,   JefMerri RayD LeBBartowumBelfairoup 08/30/21 9:57 AM

## 2021-08-31 ENCOUNTER — Encounter: Payer: Self-pay | Admitting: Gastroenterology

## 2021-08-31 ENCOUNTER — Encounter: Payer: Self-pay | Admitting: Family Medicine

## 2021-10-03 ENCOUNTER — Encounter: Payer: Self-pay | Admitting: Gastroenterology

## 2021-10-03 ENCOUNTER — Ambulatory Visit: Payer: Medicare Other | Admitting: Gastroenterology

## 2021-10-03 ENCOUNTER — Other Ambulatory Visit (INDEPENDENT_AMBULATORY_CARE_PROVIDER_SITE_OTHER): Payer: Medicare Other

## 2021-10-03 VITALS — BP 144/78 | HR 65 | Ht 68.0 in | Wt 234.0 lb

## 2021-10-03 DIAGNOSIS — K5900 Constipation, unspecified: Secondary | ICD-10-CM

## 2021-10-03 DIAGNOSIS — Z79899 Other long term (current) drug therapy: Secondary | ICD-10-CM

## 2021-10-03 DIAGNOSIS — D649 Anemia, unspecified: Secondary | ICD-10-CM

## 2021-10-03 DIAGNOSIS — Z86010 Personal history of colonic polyps: Secondary | ICD-10-CM

## 2021-10-03 DIAGNOSIS — K219 Gastro-esophageal reflux disease without esophagitis: Secondary | ICD-10-CM

## 2021-10-03 LAB — CBC WITH DIFFERENTIAL/PLATELET
Basophils Absolute: 0 10*3/uL (ref 0.0–0.1)
Basophils Relative: 0.4 % (ref 0.0–3.0)
Eosinophils Absolute: 0.2 10*3/uL (ref 0.0–0.7)
Eosinophils Relative: 2.3 % (ref 0.0–5.0)
HCT: 33.2 % — ABNORMAL LOW (ref 39.0–52.0)
Hemoglobin: 11 g/dL — ABNORMAL LOW (ref 13.0–17.0)
Lymphocytes Relative: 21.8 % (ref 12.0–46.0)
Lymphs Abs: 2.2 10*3/uL (ref 0.7–4.0)
MCHC: 33 g/dL (ref 30.0–36.0)
MCV: 90.1 fl (ref 78.0–100.0)
Monocytes Absolute: 1 10*3/uL (ref 0.1–1.0)
Monocytes Relative: 9.5 % (ref 3.0–12.0)
Neutro Abs: 6.7 10*3/uL (ref 1.4–7.7)
Neutrophils Relative %: 66 % (ref 43.0–77.0)
Platelets: 374 10*3/uL (ref 150.0–400.0)
RBC: 3.68 Mil/uL — ABNORMAL LOW (ref 4.22–5.81)
RDW: 13.7 % (ref 11.5–15.5)
WBC: 10.1 10*3/uL (ref 4.0–10.5)

## 2021-10-03 LAB — IBC + FERRITIN
Ferritin: 27.4 ng/mL (ref 22.0–322.0)
Iron: 58 ug/dL (ref 42–165)
Saturation Ratios: 16.1 % — ABNORMAL LOW (ref 20.0–50.0)
TIBC: 361.2 ug/dL (ref 250.0–450.0)
Transferrin: 258 mg/dL (ref 212.0–360.0)

## 2021-10-03 MED ORDER — SUTAB 1479-225-188 MG PO TABS: 1.0000 | ORAL_TABLET | Freq: Once | ORAL | 0 refills | Status: AC

## 2021-10-03 NOTE — Patient Instructions (Addendum)
If you are age 73 or older, your body mass index should be between 23-30. Your Body mass index is 35.58 kg/m?Marland Kitchen If this is out of the aforementioned range listed, please consider follow up with your Primary Care Provider. ? ?If you are age 57 or younger, your body mass index should be between 19-25. Your Body mass index is 35.58 kg/m?Marland Kitchen If this is out of the aformentioned range listed, please consider follow up with your Primary Care Provider.  ? ?________________________________________________________ ? ?The [facility] providers would like to encourage you to use [facility] to communicate with providers for non-urgent requests or questions.  Due to long hold times on the telephone, sending your provider a message by [facility] may be a faster and more efficient way to get a response.  Please allow 48 business hours for a response.  Please remember that this is for non-urgent requests.  ?_______________________________________________________ ? ?You have been scheduled for an Endoscopy/Colonoscopy. Please follow written instructions given to you at your visit today. ?If you use inhalers (even only as needed), please bring them with you on the day of your procedure. ? ?Continue omeprazole  ? ?Please go to the lab in the basement of our building to have lab work done as you leave today. Hit "B" for basement when you get on the elevator.  When the doors open the lab is on your left.  We will call you with the results. Thank you. ? ?Thank you for entrusting me with your care and for choosing [facility], ?Dr. Cleaton Cellar ? ?

## 2021-10-03 NOTE — Progress Notes (Signed)
? ?HPI :  ?71 year old male with a history of GERD, history of colon polyps, diabetes, here to reestablish care for consideration of endoscopy and colonoscopy, management of GERD.  He was last seen in our office in April 2018. ? ?He has had reflux symptoms for what he thinks is many years, has been on PPI for more than 5 years.  If he does not take his medication he would have some of the more typical symptoms of reflux to include pyrosis and regurgitation.  He has been taking omeprazole 20 mg once daily and that works well to control his symptoms at baseline.  He denies any dysphagia.  He has developed over time hiccups when he eats.  He states about 40% of the time when he is eating he will have significant hiccups that really bother him.  He has never had a prior EGD, he inquires about this and is very interested in having an endoscopy done to screen for Barrett's esophagus.  He denies any nausea vomiting otherwise.  Denies any postprandial abdominal pain.  No family history of esophageal cancer cancer. ? ?He takes tramadol as needed for pain, states that can lead to some constipation, uses MiraLAX as needed, sometimes this can make stools loose.  Denies any blood in the stools.  His last colonoscopy with me was in April 2018, he had 5 polyps removed, most adenomas. ? ?He denies any cardiopulmonary symptoms.  Takes an aspirin daily.  His. last A1c 9.4. ? ?I see on review of his labs that since 2018 he has had a normocytic anemia, last hemoglobin in November 21 on file was 11.5.  Do not see that iron studies have been sent.   ? ? ?Colonoscopy 10/11/2016 - The perianal and digital rectal examinations were normal. ?- A 8 mm polyp was found in the ascending colon. The polyp was sessile. The polyp was ?removed with a cold snare. Resection and retrieval were complete. ?- A 4 mm polyp was found in the ascending colon. The polyp was sessile. The polyp was ?removed with a cold snare. Resection and retrieval were  complete. ?- Two sessile polyps were found in the transverse colon. The polyps were 3 to 6 mm in size. ?These polyps were removed with a cold snare. Resection and retrieval were complete. ?- A 5 mm polyp was found in the recto-sigmoid colon. The polyp was sessile. The polyp was ?removed with a cold snare. Resection and retrieval were complete. ?- A few small-mouthed diverticula were found in the left colon and right colon. ?- Internal hemorrhoids were found during retroflexion. The hemorrhoids were small. ?- The colon was spastic which prolonged the exam. The exam was otherwise without ?abnormality. ? ?Diagnosis ?Surgical [P], ascending, transverse, rectal sigmoid, polyp (5) ?- TUBULAR ADENOMA (FIVE FRAGMENTS), HYPERPLASTIC POLYP (ONE FRAGMENT) AND BENIGN LYMPHOID POLYPS ?(FIVE FRAGMENTS). NO HIGH GRADE DYSPLASIA OR MALIGNANCY IDENTIFIED. ? ?Had recommended a repeat exam in 3 years ? ? ?Past Medical History:  ?Diagnosis Date  ? Allergic rhinitis   ? Allergy   ? Anxiety   ? Arthritis   ? Benign hypertension   ? Cataract   ? Depression   ? Diabetes mellitus ([place])   ? type 2   ? GERD (gastroesophageal reflux disease)   ? History of pneumonia   ? HLD (hyperlipidemia)   ? Neuromuscular disorder ([place])   ? pt denies at 8/19/visit   ? Pneumonia   ? hx of several times per pt - last episode 4-5 years  ago   ? ? ? ?Past Surgical History:  ?Procedure Laterality Date  ? CARPAL TUNNEL RELEASE Bilateral 2020 Right, 2021 left  ? CATARACT EXTRACTION    ? bilateral  ? EYE SURGERY    ? KNEE SURGERY    ? right  ? 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  ? Procedure: LEFT TOTAL KNEE ARTHROPLASTY;  Surgeon: Mcarthur Rossetti, MD;  Location: [facility];  Service: Orthopedics;  Laterality: Left;  ? ?Family History  ?Problem Relation Age of Onset  ? Diabetes Father   ? Colon polyps Father   ? Atrial fibrillation Mother   ? Prostate cancer Paternal Uncle   ? Cancer Sister   ? Esophageal cancer Neg Hx   ? Rectal cancer Neg Hx   ? Stomach  cancer Neg Hx   ? ?Social History  ? ?Tobacco Use  ? Smoking status: Never  ? Smokeless tobacco: Never  ?Vaping Use  ? Vaping Use: Never used  ?Substance Use Topics  ? Alcohol use: No  ? Drug use: No  ? ?Current Outpatient Medications  ?Medication Sig Dispense Refill  ? albuterol (VENTOLIN HFA) 108 (90 Base) MCG/ACT inhaler INHALE 2 PUFFS BY MOUTH EVERY 6 HOURS AS NEEDED FOR WHEEZING OR SHORTNESS OF BREATH 9 g 0  ? aspirin EC 325 MG EC tablet Take 1 tablet (325 mg total) by mouth 2 (two) times daily after a meal. (Patient taking differently: Take 81 mg by mouth 2 (two) times daily after a meal.) 30 tablet 0  ? blood glucose meter kit and supplies 1 each by Other route as directed. Dispense based on patient and insurance preference. Check once per day (variable times).  ?Accu check. 1 each 0  ? cetirizine (ZYRTEC) 10 MG tablet Take 10 mg by mouth daily as needed for allergies.     ? Cholecalciferol (VITAMIN D) 125 MCG (5000 UT) CAPS Take 5,000 Units by mouth daily.    ? Cyanocobalamin (VITAMIN B 12 PO) Take 1,000 mg by mouth daily.     ? cyclobenzaprine (FLEXERIL) 10 MG tablet Take 1 tablet (10 mg total) by mouth 3 (three) times daily as needed for muscle spasms. 30 tablet 0  ? diclofenac Sodium (VOLTAREN) 1 % GEL Apply 2 g topically daily.    ? fluticasone (FLONASE) 50 MCG/ACT nasal spray Place 1 spray into both nostrils daily.    ? gabapentin (NEURONTIN) 300 MG capsule TAKE 1 TO 2 CAPSULES BY MOUTH TWICE DAILY AS NEEDED 180 capsule 1  ? glimepiride (AMARYL) 4 MG tablet Take 1 tablet (4 mg total) by mouth 2 (two) times daily. 180 tablet 1  ? hydrochlorothiazide (MICROZIDE) 12.5 MG capsule Take 1 capsule (12.5 mg total) by mouth daily. 90 capsule 1  ? Lancets (ONETOUCH DELICA PLUS ZOXWRU04V) MISC USE 1 TO CHECK GLUCOSE ONCE DAILY    ? liraglutide (VICTOZA) 18 MG/3ML SOPN Inject 1.8 mg into the skin daily. Start 0.43m qd for 1 week, then 1.2643mQD for 1 week, then 1.43m60mD. 3 mL 5  ? lisinopril (ZESTRIL) 20 MG tablet  Take 1 tablet (20 mg total) by mouth daily. 90 tablet 3  ? metFORMIN (GLUCOPHAGE) 1000 MG tablet Take 1 tablet (1,000 mg total) by mouth 2 (two) times daily with a meal. 180 tablet 1  ? montelukast (SINGULAIR) 10 MG tablet Take 1 tablet (10 mg total) by mouth at bedtime. 90 tablet 2  ? Multiple Vitamin (MULTIVITAMIN WITH MINERALS) TABS tablet Take 1 tablet by mouth daily.    ?  omeprazole (PRILOSEC) 20 MG capsule Take 1 capsule (20 mg total) by mouth daily. 90 capsule 1  ? ondansetron (ZOFRAN ODT) 4 MG disintegrating tablet Take 1 tablet (4 mg total) by mouth every 8 (eight) hours as needed for nausea or vomiting. 20 tablet 0  ? ONETOUCH ULTRA test strip USE 1 STRIP TO CHECK GLUCOSE ONCE DAILY 100 each 1  ? pravastatin (PRAVACHOL) 80 MG tablet Take 1 tablet (80 mg total) by mouth daily. 90 tablet 1  ? sertraline (ZOLOFT) 100 MG tablet 1 tablet daily. 90 tablet 1  ? sodium chloride (OCEAN) 0.65 % SOLN nasal spray Place 1 spray into both nostrils as needed for congestion.    ? Sodium Sulfate-Mag Sulfate-KCl (SUTAB) [phone] MG TABS Take 1 kit by mouth once for 1 dose. 24 tablet 0  ? traMADol (ULTRAM) 50 MG tablet Take 1 tablet (50 mg total) by mouth every 6 (six) hours as needed. 120 tablet 1  ? vitamin E 180 MG (400 UNITS) capsule Take 400 Units by mouth daily.    ? ?No current facility-administered medications for this visit.  ? ?Allergies  ?Allergen Reactions  ? Actos [Pioglitazone] Other (See Comments), Shortness Of Breath and Swelling  ?  Blood sugar went up and down and sweating profusely  ? Codeine Shortness Of Breath and Palpitations  ? ? ? ?Review of Systems: ?All systems reviewed and negative except where noted in HPI.  ? ?Lab Results  ?Component Value Date  ? WBC 8.6 05/30/2020  ? HGB 11.5 (L) 05/30/2020  ? HCT 35.3 (L) 05/30/2020  ? MCV 87 05/30/2020  ? PLT 373 05/30/2020  ? ? ?Lab Results  ?Component Value Date  ? IRON 92 07/25/2015  ? TIBC 358 07/25/2015  ? ? ?  Latest Ref Rng & Units 05/30/2020  ?  11:49 AM 02/27/2020  ?  3:30 AM 02/18/2020  ?  8:41 AM  ?CBC  ?WBC 3.4 - 10.8 x10E3/uL 8.6   15.5   9.5    ?Hemoglobin 13.0 - 17.7 g/dL 11.5   8.3   10.8    ?Hematocrit 37.5 - 51.0 % 35.3   26.2   34.1    ?Platelets 1

## 2021-10-05 ENCOUNTER — Other Ambulatory Visit: Payer: Self-pay

## 2021-10-05 DIAGNOSIS — Z86010 Personal history of colonic polyps: Secondary | ICD-10-CM

## 2021-10-05 DIAGNOSIS — Z79899 Other long term (current) drug therapy: Secondary | ICD-10-CM

## 2021-10-05 DIAGNOSIS — K219 Gastro-esophageal reflux disease without esophagitis: Secondary | ICD-10-CM

## 2021-10-05 DIAGNOSIS — K5900 Constipation, unspecified: Secondary | ICD-10-CM

## 2021-10-05 DIAGNOSIS — D649 Anemia, unspecified: Secondary | ICD-10-CM

## 2021-10-05 MED ORDER — FERROUS SULFATE 325 (65 FE) MG PO TBEC: 325.0000 mg | DELAYED_RELEASE_TABLET | Freq: Every day | ORAL | 0 refills | Status: DC

## 2021-10-05 NOTE — Addendum Note (Signed)
Addended by: Yevette Edwards on: 10/05/2021 09:13 AM ? ? Modules accepted: Orders ? ?

## 2021-11-14 ENCOUNTER — Encounter: Payer: Self-pay | Admitting: Gastroenterology

## 2021-11-16 ENCOUNTER — Telehealth: Payer: Self-pay

## 2021-11-16 NOTE — Telephone Encounter (Signed)
-----   Message from Yevette Edwards, RN sent at 10/05/2021  8:58 AM EDT ----- Regarding: Labs CBC/diff, B12, and TSH - the orders are in epic

## 2021-11-16 NOTE — Telephone Encounter (Signed)
Spoke with patient to remind him that he is due for repeat labs at this time. No appointment is necessary. Patient is aware that he can stop by the lab in the basement at his/her convenience between 7:30 AM - 5 PM, Monday through Friday. Patient verbalized understanding and had no concerns at the end of the call.

## 2021-11-20 ENCOUNTER — Other Ambulatory Visit (INDEPENDENT_AMBULATORY_CARE_PROVIDER_SITE_OTHER): Payer: Medicare Other

## 2021-11-20 DIAGNOSIS — Z79899 Other long term (current) drug therapy: Secondary | ICD-10-CM | POA: Diagnosis not present

## 2021-11-20 DIAGNOSIS — Z86010 Personal history of colonic polyps: Secondary | ICD-10-CM

## 2021-11-20 DIAGNOSIS — K219 Gastro-esophageal reflux disease without esophagitis: Secondary | ICD-10-CM

## 2021-11-20 DIAGNOSIS — K5900 Constipation, unspecified: Secondary | ICD-10-CM | POA: Diagnosis not present

## 2021-11-20 DIAGNOSIS — D649 Anemia, unspecified: Secondary | ICD-10-CM

## 2021-11-20 LAB — CBC WITH DIFFERENTIAL/PLATELET
Basophils Absolute: 0.1 10*3/uL (ref 0.0–0.1)
Basophils Relative: 0.9 % (ref 0.0–3.0)
Eosinophils Absolute: 0.2 10*3/uL (ref 0.0–0.7)
Eosinophils Relative: 2.1 % (ref 0.0–5.0)
HCT: 31.6 % — ABNORMAL LOW (ref 39.0–52.0)
Hemoglobin: 10.4 g/dL — ABNORMAL LOW (ref 13.0–17.0)
Lymphocytes Relative: 24.2 % (ref 12.0–46.0)
Lymphs Abs: 2.8 10*3/uL (ref 0.7–4.0)
MCHC: 32.8 g/dL (ref 30.0–36.0)
MCV: 90.1 fl (ref 78.0–100.0)
Monocytes Absolute: 1 10*3/uL (ref 0.1–1.0)
Monocytes Relative: 8.5 % (ref 3.0–12.0)
Neutro Abs: 7.4 10*3/uL (ref 1.4–7.7)
Neutrophils Relative %: 64.3 % (ref 43.0–77.0)
Platelets: 347 10*3/uL (ref 150.0–400.0)
RBC: 3.51 Mil/uL — ABNORMAL LOW (ref 4.22–5.81)
RDW: 13.6 % (ref 11.5–15.5)
WBC: 11.5 10*3/uL — ABNORMAL HIGH (ref 4.0–10.5)

## 2021-11-20 LAB — TSH: TSH: 2.65 u[IU]/mL (ref 0.35–5.50)

## 2021-11-20 LAB — VITAMIN B12: Vitamin B-12: 424 pg/mL (ref 211–911)

## 2021-11-21 ENCOUNTER — Encounter: Payer: Self-pay | Admitting: Gastroenterology

## 2021-11-21 ENCOUNTER — Ambulatory Visit (AMBULATORY_SURGERY_CENTER): Payer: Medicare Other | Admitting: Gastroenterology

## 2021-11-21 VITALS — BP 127/75 | HR 56 | Temp 96.2°F | Resp 12 | Ht 68.0 in | Wt 234.0 lb

## 2021-11-21 DIAGNOSIS — Z86010 Personal history of colonic polyps: Secondary | ICD-10-CM | POA: Diagnosis not present

## 2021-11-21 DIAGNOSIS — D124 Benign neoplasm of descending colon: Secondary | ICD-10-CM

## 2021-11-21 DIAGNOSIS — D123 Benign neoplasm of transverse colon: Secondary | ICD-10-CM

## 2021-11-21 DIAGNOSIS — K219 Gastro-esophageal reflux disease without esophagitis: Secondary | ICD-10-CM

## 2021-11-21 MED ORDER — SODIUM CHLORIDE 0.9 % IV SOLN: 500.0000 mL | Freq: Once | INTRAVENOUS | Status: DC

## 2021-11-21 NOTE — Progress Notes (Signed)
VS completed by DT.  Pt's states no medical or surgical changes since previsit or office visit.  

## 2021-11-21 NOTE — Op Note (Signed)
[place] Patient Name: Doctor Sheahan Procedure Date: 11/21/2021 3:00 PM MRN: [id number] Endoscopist: Remo Lipps P. Havery Moros , MD Age: 71 Referring MD:  Date of Birth: [birth year] Gender: Male Account #: [id number] Procedure:                Colonoscopy Indications:              High risk colon cancer surveillance: Personal                            history of colonic polyps - 5 polyps removed 09/2016 Medicines:                Monitored Anesthesia Care Procedure:                Pre-Anesthesia Assessment:                           - Prior to the procedure, a History and Physical                            was performed, and patient medications and                            allergies were reviewed. The patient's tolerance of                            previous anesthesia was also reviewed. The risks                            and benefits of the procedure and the sedation                            options and risks were discussed with the patient.                            All questions were answered, and informed consent                            was obtained. Prior Anticoagulants: The patient has                            taken no previous anticoagulant or antiplatelet                            agents. ASA Grade Assessment: II - A patient with                            mild systemic disease. After reviewing the risks                            and benefits, the patient was deemed in                            satisfactory condition to undergo the procedure.  After obtaining informed consent, the colonoscope                            was passed under direct vision. Throughout the                            procedure, the patient's blood pressure, pulse, and                            oxygen saturations were monitored continuously. The                            CF [id number] #[id number] was introduced through the anus                            and  advanced to the the cecum, identified by                            appendiceal orifice and ileocecal valve. The                            colonoscopy was performed without difficulty. The                            patient tolerated the procedure well. The quality                            of the bowel preparation was good. The ileocecal                            valve, appendiceal orifice, and rectum were                            photographed. Scope In: 3:17:48 PM Scope Out: 3:37:36 PM Scope Withdrawal Time: 0 hours 15 minutes 22 seconds  Total Procedure Duration: 0 hours 19 minutes 48 seconds  Findings:                 The perianal and digital rectal examinations were                            normal.                           A 3 mm polyp was found in the transverse colon. The                            polyp was sessile. The polyp was removed with a                            cold snare. Resection and retrieval were complete.                           A 2 mm polyp was found in the descending colon. The  polyp was sessile. The polyp was removed with a                            cold snare. Resection and retrieval were complete.                           Multiple small-mouthed diverticula were found in                            the sigmoid colon.                           The exam was otherwise without abnormality. Complications:            No immediate complications. Estimated blood loss:                            Minimal. Estimated Blood Loss:     Estimated blood loss was minimal. Impression:               - One 3 mm polyp in the transverse colon, removed                            with a cold snare. Resected and retrieved.                           - One 2 mm polyp in the descending colon, removed                            with a cold snare. Resected and retrieved.                           - Diverticulosis in the sigmoid colon.                            - The examination was otherwise normal. Recommendation:           - Patient has a contact number available for                            emergencies. The signs and symptoms of potential                            delayed complications were discussed with the                            patient. Return to normal activities tomorrow.                            Written discharge instructions were provided to the                            patient.                           - Resume previous diet.                           -  Continue present medications.                           - Await pathology results. Remo Lipps P. Braelee Herrle, MD 11/21/2021 3:45:50 PM This report has been signed electronically.

## 2021-11-21 NOTE — Op Note (Signed)
[name] Procedure Date: 11/21/2021 3:00 PM MRN: [id number] Endoscopist: Remo Lipps P. Havery Moros , MD Age: 71 Referring MD:  Date of Birth: 31-Aug-1950 Gender: Male Account #: [id number] Procedure:                Upper GI endoscopy Indications:              Screening for Barrett's esophagus, Follow-up of                            gastro-esophageal reflux disease - on omeprazole                            '20mg'$  / daily Medicines:                Monitored Anesthesia Care Procedure:                Pre-Anesthesia Assessment:                           - Prior to the procedure, a History and Physical                            was performed, and patient medications and                            allergies were reviewed. The patient's tolerance of                            previous anesthesia was also reviewed. The risks                            and benefits of the procedure and the sedation                            options and risks were discussed with the patient.                            All questions were answered, and informed consent                            was obtained. Prior Anticoagulants: The patient has                            taken no previous anticoagulant or antiplatelet                            agents. ASA Grade Assessment: II - A patient with                            mild systemic disease. After reviewing the risks                            and benefits, the patient was deemed in  satisfactory condition to undergo the procedure.                           After obtaining informed consent, the endoscope was                            passed under direct vision. Throughout the                            procedure, the patient's blood pressure, pulse, and                            oxygen saturations were monitored continuously. The                            Endoscope was introduced through the  mouth, and                            advanced to the second part of duodenum. The upper                            GI endoscopy was accomplished without difficulty.                            The patient tolerated the procedure well. Scope In: Scope Out: Findings:                 Esophagogastric landmarks were identified: the                            Z-line was found at 39 cm, the gastroesophageal                            junction was found at 39 cm and the upper extent of                            the gastric folds was found at 39 cm from the                            incisors.                           The exam of the esophagus was otherwise normal. No                            Barrett's.                           The entire examined stomach was normal.                           The duodenal bulb and second portion of the                            duodenum were normal. Complications:  No immediate complications. Estimated blood loss:                            None. Estimated Blood Loss:     Estimated blood loss: none. Impression:               - Esophagogastric landmarks identified.                           - Normal esophagus - no Barrett's                           - Normal stomach.                           - Normal duodenal bulb and second portion of the                            duodenum. Recommendation:           - Patient has a contact number available for                            emergencies. The signs and symptoms of potential                            delayed complications were discussed with the                            patient. Return to normal activities tomorrow.                            Written discharge instructions were provided to the                            patient.                           - Resume previous diet.                           - Continue present medications. Remo Lipps P. Etheleen Valtierra, MD 11/21/2021 3:41:42 PM This report  has been signed electronically.

## 2021-11-21 NOTE — Progress Notes (Signed)
Report to PACU, RN, vss, BBS= Clear.  

## 2021-11-21 NOTE — Progress Notes (Signed)
Called to room to assist during endoscopic procedure.  Patient ID and intended procedure confirmed with present staff. Received instructions for my participation in the procedure from the performing physician.  

## 2021-11-21 NOTE — Progress Notes (Signed)
[facility] History and Physical   Primary Care Physician:  Wendie Agreste, MD   Reason for Procedure:   GERD - screening for BE, history of colon polyps  Plan:    EGD and colonoscopy     HPI: Daniel [name] is a 71 y.o. male  here for colonoscopy surveillance - 5 polyps removed in 2018. Also history of GERD on omeprazole, screening for GERD.  Otherwise feels well without any cardiopulmonary symptoms. Have discussed risks / benefits and he wants to proceed.   Past Medical History:  Diagnosis Date   Allergic rhinitis    Allergy    Anxiety    Arthritis    Benign hypertension    Cataract    Depression    Diabetes mellitus ([place])    type 2    GERD (gastroesophageal reflux disease)    History of pneumonia    HLD (hyperlipidemia)    Neuromuscular disorder ([place])    pt denies at 8/19/visit    Pneumonia    hx of several times per pt - last episode 4-5 years ago     Past Surgical History:  Procedure Laterality Date   CARPAL TUNNEL RELEASE Bilateral 2020 Right, 2021 left   CATARACT EXTRACTION     bilateral   EYE SURGERY     KNEE SURGERY     right   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 Authorizing Provider  aspirin EC 325 MG EC tablet Take 1 tablet (325 mg total) by mouth 2 (two) times daily after a meal. Patient taking differently: Take 81 mg by mouth 2 (two) times daily after a meal. 02/27/20  Yes Mcarthur Rossetti, MD  blood glucose meter kit and supplies 1 each by Other route as directed. Dispense based on patient and insurance preference. Check once per day (variable times).  Accu check. 07/29/18  Yes Wendie Agreste, MD  cetirizine (ZYRTEC) 10 MG tablet Take 10 mg by mouth daily as needed for allergies.    Yes [provider]  Cholecalciferol (VITAMIN D) 125 MCG (5000 UT) CAPS Take 5,000 Units by mouth daily.   Yes [provider]  Cyanocobalamin (VITAMIN B 12 PO) Take 1,000 mg by mouth daily.    Yes [provider]  diclofenac Sodium (VOLTAREN) 1 % GEL Apply 2 g topically daily.   Yes [provider]  ferrous sulfate 325 (65 FE) MG EC tablet Take 1 tablet (325 mg total) by mouth daily with breakfast. 10/05/21  Yes Lyndle Pang, Carlota Raspberry, MD  fluticasone (FLONASE) 50 MCG/ACT nasal spray Place 1 spray into both nostrils daily.   Yes [provider]  gabapentin (NEURONTIN) 300 MG capsule TAKE 1 TO 2 CAPSULES BY MOUTH TWICE DAILY AS NEEDED 08/30/21  Yes Wendie Agreste, MD  glimepiride (AMARYL) 4 MG tablet Take 1 tablet (4 mg total) by mouth 2 (two) times daily. 08/30/21  Yes Wendie Agreste, MD  hydrochlorothiazide (MICROZIDE) 12.5 MG capsule Take 1 capsule (12.5 mg total) by mouth daily. 08/30/21  Yes Wendie Agreste, MD  Lancets [facility] DELICA PLUS [id number]) MISC USE 1 TO CHECK GLUCOSE ONCE DAILY 08/04/18  Yes [provider]  liraglutide (VICTOZA) 18 MG/3ML SOPN Inject 1.8 mg into the skin daily. Start 0.26m qd for 1  week, then 1.52m QD for 1 week, then 1.831mQD. 08/30/21  Yes GrWendie AgresteMD  lisinopril (ZESTRIL) 20 MG tablet Take 1 tablet (20 mg total) by mouth daily. 08/30/21  Yes GrWendie AgresteMD  metFORMIN (GLUCOPHAGE) 1000 MG tablet Take 1 tablet (1,000 mg total) by mouth 2 (two) times daily with a meal. 08/30/21  Yes GrWendie AgresteMD  montelukast (SINGULAIR) 10 MG tablet Take 1 tablet (10 mg total) by mouth at bedtime. 08/30/21  Yes GrWendie AgresteMD  Multiple Vitamin (MULTIVITAMIN WITH MINERALS) TABS tablet Take 1 tablet by mouth daily.   Yes [provider]  omeprazole (PRILOSEC) 20 MG capsule Take 1 capsule (20 mg total) by mouth daily. 08/30/21  Yes GrWendie AgresteMD  ON[facility] LLCLTRA test strip USE 1 STRIP TO CHECK GLUCOSE ONCE  DAILY 11/25/18  Yes GrWendie AgresteMD  pravastatin (PRAVACHOL) 80 MG tablet Take 1 tablet (80 mg total) by mouth daily. 08/30/21  Yes GrWendie AgresteMD  sertraline (ZOLOFT) 100 MG tablet 1 tablet daily. 08/30/21  Yes GrWendie AgresteMD  traMADol (ULTRAM) 50 MG tablet Take 1 tablet (50 mg total) by mouth every 6 (six) hours as needed. 08/30/21  Yes GrWendie AgresteMD  vitamin E 180 MG (400 UNITS) capsule Take 400 Units by mouth daily.   Yes [provider]  albuterol (VENTOLIN HFA) 108 (90 Base) MCG/ACT inhaler INHALE 2 PUFFS BY MOUTH EVERY 6 HOURS AS NEEDED FOR WHEEZING OR SHORTNESS OF BREATH 06/08/21   MoMaximiano CossNP  cyclobenzaprine (FLEXERIL) 10 MG tablet Take 1 tablet (10 mg total) by mouth 3 (three) times daily as needed for muscle spasms. 10/26/20   MoMaximiano CossNP  ondansetron (ZOFRAN ODT) 4 MG disintegrating tablet Take 1 tablet (4 mg total) by mouth every 8 (eight) hours as needed for nausea or vomiting. 02/27/20   BlMcarthur RossettiMD  sodium chloride (OCEAN) 0.65 % SOLN nasal spray Place 1 spray into both nostrils as needed for congestion.    [provider]    Current Outpatient Medications  Medication Sig Dispense Refill   aspirin EC 325 MG EC tablet Take 1 tablet (325 mg total) by mouth 2 (two) times daily after a meal. (Patient taking differently: Take 81 mg by mouth 2 (two) times daily after a meal.) 30 tablet 0   blood glucose meter kit and supplies 1 each by Other route as directed. Dispense based on patient and insurance preference. Check once per day (variable times).  Accu check. 1 each 0   cetirizine (ZYRTEC) 10 MG tablet Take 10 mg by mouth daily as needed for allergies.      Cholecalciferol (VITAMIN D) 125 MCG (5000 UT) CAPS Take 5,000 Units by mouth daily.     Cyanocobalamin (VITAMIN B 12 PO) Take 1,000 mg by mouth daily.      diclofenac Sodium (VOLTAREN) 1 % GEL Apply 2 g topically daily.     ferrous sulfate 325 (65 FE) MG EC tablet  Take 1 tablet (325 mg total) by mouth daily with breakfast. 30 tablet 0   fluticasone (FLONASE) 50 MCG/ACT nasal spray Place 1 spray into both nostrils daily.     gabapentin (NEURONTIN) 300 MG capsule TAKE 1 TO 2 CAPSULES BY MOUTH TWICE DAILY AS NEEDED 180 capsule 1   glimepiride (AMARYL) 4 MG tablet Take 1 tablet (4 mg total) by mouth 2 (two) times daily. 180 tablet 1   hydrochlorothiazide (MICROZIDE)  12.5 MG capsule Take 1 capsule (12.5 mg total) by mouth daily. 90 capsule 1   Lancets (ONETOUCH DELICA PLUS [id number]) MISC USE 1 TO CHECK GLUCOSE ONCE DAILY     liraglutide (VICTOZA) 18 MG/3ML SOPN Inject 1.8 mg into the skin daily. Start 0.47m qd for 1 week, then 1.293mQD for 1 week, then 1.58m44mD. 3 mL 5   lisinopril (ZESTRIL) 20 MG tablet Take 1 tablet (20 mg total) by mouth daily. 90 tablet 3   metFORMIN (GLUCOPHAGE) 1000 MG tablet Take 1 tablet (1,000 mg total) by mouth 2 (two) times daily with a meal. 180 tablet 1   montelukast (SINGULAIR) 10 MG tablet Take 1 tablet (10 mg total) by mouth at bedtime. 90 tablet 2   Multiple Vitamin (MULTIVITAMIN WITH MINERALS) TABS tablet Take 1 tablet by mouth daily.     omeprazole (PRILOSEC) 20 MG capsule Take 1 capsule (20 mg total) by mouth daily. 90 capsule 1   ONETOUCH ULTRA test strip USE 1 STRIP TO CHECK GLUCOSE ONCE DAILY 100 each 1   pravastatin (PRAVACHOL) 80 MG tablet Take 1 tablet (80 mg total) by mouth daily. 90 tablet 1   sertraline (ZOLOFT) 100 MG tablet 1 tablet daily. 90 tablet 1   traMADol (ULTRAM) 50 MG tablet Take 1 tablet (50 mg total) by mouth every 6 (six) hours as needed. 120 tablet 1   vitamin E 180 MG (400 UNITS) capsule Take 400 Units by mouth daily.     albuterol (VENTOLIN HFA) 108 (90 Base) MCG/ACT inhaler INHALE 2 PUFFS BY MOUTH EVERY 6 HOURS AS NEEDED FOR WHEEZING OR SHORTNESS OF BREATH 9 g 0   cyclobenzaprine (FLEXERIL) 10 MG tablet Take 1 tablet (10 mg total) by mouth 3 (three) times daily as needed for muscle spasms. 30 tablet  0   ondansetron (ZOFRAN ODT) 4 MG disintegrating tablet Take 1 tablet (4 mg total) by mouth every 8 (eight) hours as needed for nausea or vomiting. 20 tablet 0   sodium chloride (OCEAN) 0.65 % SOLN nasal spray Place 1 spray into both nostrils as needed for congestion.     Current Facility-Administered Medications  Medication Dose Route Frequency Provider Last Rate Last Admin   0.9 %  sodium chloride infusion  500 mL Intravenous Once Avyn Coate, SteCarlota RaspberryD        Allergies as of 11/21/2021 - Review Complete 11/21/2021  Allergen Reaction Noted   Actos [pioglitazone] Other (See Comments), Shortness Of Breath, and Swelling 05/03/2015   Codeine Shortness Of Breath and Palpitations 02/29/2012    Family History  Problem Relation Age of Onset   Diabetes Father    Colon polyps Father    Atrial fibrillation Mother    Prostate cancer Paternal Uncle    Cancer Sister    Esophageal cancer Neg Hx    Rectal cancer Neg Hx    Stomach cancer Neg Hx     Social History   Socioeconomic History   Marital status: Married    Spouse name: Not on file   Number of children: 3   Years of education: 16   Highest education level: Not on file  Occupational History   Occupation: tilDealerinCompany secretaryobacco Use   Smoking status: Never   Smokeless tobacco: Never  Vaping Use   Vaping Use: Never used  Substance and Sexual Activity   Alcohol use: No   Drug use: No   Sexual activity: Not Currently  Other Topics Concern   Not on file  Social History Narrative   Not on file   Social Determinants of Health   Financial Resource Strain: Low Risk    Difficulty of Paying Living Expenses: Not hard at all  Food Insecurity: No Food Insecurity   Worried About Charity fundraiser in the Last Year: Never true   Arboriculturist in the Last Year: Never true  Transportation Needs: No Transportation Needs   Lack of Transportation (Medical): No   Lack of Transportation (Non-Medical): No  Physical  Activity: Sufficiently Active   Days of Exercise per Week: 5 days   Minutes of Exercise per Session: 40 min  Stress: No Stress Concern Present   Feeling of Stress : Not at all  Social Connections: Socially Integrated   Frequency of Communication with Friends and Family: More than three times a week   Frequency of Social Gatherings with Friends and Family: More than three times a week   Attends Religious Services: More than 4 times per year   Active Member of [employer] or Organizations: Yes   Attends Archivist Meetings: 1 to 4 times per year   Marital Status: Married  Human resources officer Violence: Not At Risk   Fear of Current or Ex-Partner: No   Emotionally Abused: No   Physically Abused: No   Sexually Abused: No    Review of Systems: All other review of systems negative except as mentioned in the HPI.  Physical Exam: Vital signs BP (!) 145/78   Pulse 66   Temp (!) 96.2 F (35.7 C) (Temporal)   Resp (!) 8   Ht _0  (1.727 m)   Wt 234 lb (106.1 kg)   SpO2 98%   BMI 35.58 kg/m   General:   Alert,  Well-developed, pleasant and cooperative in NAD Lungs:  Clear throughout to auscultation.   Heart:  Regular rate and rhythm Abdomen:  Soft, nontender and nondistended.   Neuro/Psych:  Alert and cooperative. Normal mood and affect. A and O x 3  Jolly Mango, MD [facility]

## 2021-11-21 NOTE — Patient Instructions (Signed)
Please read handouts provided. Continue present medications. Await pathology results.     YOU HAD AN ENDOSCOPIC PROCEDURE TODAY AT THE [facility]:   Refer to the procedure report that was given to you for any specific questions about what was found during the examination.  If the procedure report does not answer your questions, please call your gastroenterologist to clarify.  If you requested that your care partner not be given the details of your procedure findings, then the procedure report has been included in a sealed envelope for you to review at your convenience later.  YOU SHOULD EXPECT: Some feelings of bloating in the abdomen. Passage of more gas than usual.  Walking can help get rid of the air that was put into your GI tract during the procedure and reduce the bloating. If you had a lower endoscopy (such as a colonoscopy or flexible sigmoidoscopy) you may notice spotting of blood in your stool or on the toilet paper. If you underwent a bowel prep for your procedure, you may not have a normal bowel movement for a few days.  Please Note:  You might notice some irritation and congestion in your nose or some drainage.  This is from the oxygen used during your procedure.  There is no need for concern and it should clear up in a day or so.  SYMPTOMS TO REPORT IMMEDIATELY:   Following lower endoscopy (colonoscopy or flexible sigmoidoscopy):  Excessive amounts of blood in the stool  Significant tenderness or worsening of abdominal pains  Swelling of the abdomen that is new, acute  Fever of 100F or higher   Following upper endoscopy (EGD)  Vomiting of blood or coffee ground material  New chest pain or pain under the shoulder blades  Painful or persistently difficult swallowing  New shortness of breath  Fever of 100F or higher  Black, tarry-looking stools  For urgent or emergent issues, a gastroenterologist can be reached at any hour by calling [phone]. Do not  use MyChart messaging for urgent concerns.    DIET:  We do recommend a small meal at first, but then you may proceed to your regular diet.  Drink plenty of fluids but you should avoid alcoholic beverages for 24 hours.  ACTIVITY:  You should plan to take it easy for the rest of today and you should NOT DRIVE or use heavy machinery until tomorrow (because of the sedation medicines used during the test).    FOLLOW UP: Our staff will call the number listed on your records 48-72 hours following your procedure to check on you and address any questions or concerns that you may have regarding the information given to you following your procedure. If we do not reach you, we will leave a message.  We will attempt to reach you two times.  During this call, we will ask if you have developed any symptoms of COVID 19. If you develop any symptoms (ie: fever, flu-like symptoms, shortness of breath, cough etc.) before then, please call [phone].  If you test positive for Covid 19 in the 2 weeks post procedure, please call and report this information to us.    If any biopsies were taken you will be contacted by phone or by letter within the next 1-3 weeks.  Please call us at [phone] if you have not heard about the biopsies in 3 weeks.    SIGNATURES/CONFIDENTIALITY: You and/or your care partner have signed paperwork which will be entered into your electronic medical   record.  These signatures attest to the fact that that the information above on your After Visit Summary has been reviewed and is understood.  Full responsibility of the confidentiality of this discharge information lies with you and/or your care-partner. 

## 2021-11-22 ENCOUNTER — Telehealth: Payer: Self-pay

## 2021-11-22 NOTE — Telephone Encounter (Signed)
  Follow up Call-     11/21/2021    2:16 PM  Call back number  Post procedure Call Back phone  # [phone]  Permission to leave phone message Yes     Patient questions:  Do you have a fever, pain , or abdominal swelling? No. Pain Score  0 *  Have you tolerated food without any problems? Yes.    Have you been able to return to your normal activities? Yes.    Do you have any questions about your discharge instructions: Diet   No. Medications  No. Follow up visit  No.  Do you have questions or concerns about your Care? No.  Actions: * If pain score is 4 or above: No action needed, pain <4.

## 2021-11-28 ENCOUNTER — Other Ambulatory Visit: Payer: Self-pay | Admitting: Family Medicine

## 2021-11-28 DIAGNOSIS — G8929 Other chronic pain: Secondary | ICD-10-CM

## 2021-11-29 NOTE — Telephone Encounter (Signed)
Patient is requesting a refill of the following medications: Requested Prescriptions   Pending Prescriptions Disp Refills   traMADol (ULTRAM) 50 MG tablet [Pharmacy Med Name: traMADol HCl 50 MG Oral Tablet] 120 tablet 0    Sig: TAKE 1 TABLET BY MOUTH EVERY 6 HOURS AS NEEDED    Date of patient request: 11/28/2021 Last office visit: 08/30/2021 Date of last refill:08/30/2021 Last refill amount: 120 tablets 1 tablets  Follow up time period per chart: 11/30/2021

## 2021-11-29 NOTE — Telephone Encounter (Signed)
Controlled substance database reviewed.  Tramadol last filled for #120, 30-day supply on 10/16/2021.  Previously August 30, 2021.  Discussed at recent visit, refill ordered.

## 2021-11-30 ENCOUNTER — Ambulatory Visit (INDEPENDENT_AMBULATORY_CARE_PROVIDER_SITE_OTHER): Payer: Medicare Other | Admitting: Family Medicine

## 2021-11-30 ENCOUNTER — Encounter: Payer: Self-pay | Admitting: Family Medicine

## 2021-11-30 VITALS — BP 130/84 | HR 73 | Temp 98.4°F | Resp 16 | Ht 68.0 in | Wt 227.8 lb

## 2021-11-30 DIAGNOSIS — F32A Depression, unspecified: Secondary | ICD-10-CM | POA: Diagnosis not present

## 2021-11-30 DIAGNOSIS — G4700 Insomnia, unspecified: Secondary | ICD-10-CM | POA: Diagnosis not present

## 2021-11-30 DIAGNOSIS — E1142 Type 2 diabetes mellitus with diabetic polyneuropathy: Secondary | ICD-10-CM | POA: Diagnosis not present

## 2021-11-30 LAB — POCT GLYCOSYLATED HEMOGLOBIN (HGB A1C): Hemoglobin A1C: 8.4 % — AB (ref 4.0–5.6)

## 2021-11-30 MED ORDER — SERTRALINE HCL 50 MG PO TABS: 50.0000 mg | ORAL_TABLET | Freq: Every day | ORAL | 3 refills | Status: DC

## 2021-11-30 NOTE — Progress Notes (Signed)
Subjective:  Patient ID: Daniel [name], male    DOB: [birth year]  Age: 71 y.o. MRN: [id number]  CC:  Chief Complaint  Patient presents with   Diabetes    Pt presents for a 3 month follow up. Pt has no concerns. Pt did make a chart of readings.    Insomnia    Pt states he is having trouble sleeping for 2 to 3 months and its gotten worse in the last month.    HPI Daniel [name] presents for   Diabetes: With hyperglycemia, polyneuropathy, uncontrolled, prior off Victoza, and then restarted at March visit.  continued metformin and Amaryl. Plan to titrate up Victoza, with ultimate dose of 1.8 mg daily Currently taking 1.62m per day. Only missed 2 days, compliant with meds.  Metformin 1000 mg twice daily, glimepiride 4 mg twice daily, and on statin, ACE inhibitor.  Gabapentin 300 mg 2 at night only for neuropathy.  Tramadol for chronic knee pain up to 4 times daily. Home readings fasting 84-200's. Some sweets at times, carbs at times. Better with less carbs. Numbers are improving past 3 weeks with better diet.  Home readings postprandial 122, 154,186. 129, 139 Symptomatic lows - none.  Microalbumin: Due Optho, foot exam, pneumovax: Plans to make appointment with Optho.  Lab Results  Component Value Date   HGBA1C 8.4 (A) 11/30/2021   HGBA1C 9.4 (H) 08/30/2021   HGBA1C 9.1 Repeated and verified X2. (H) 11/10/2020   Lab Results  Component Value Date   MICROALBUR 15.1 05/03/2015   LDLCALC 148 (H) 08/30/2021   CREATININE 1.34 08/30/2021   Insomnia Past few months. Difficulty getting to sleep usually.  Nocturia 2-4 per night. Same for awhile.  No hx of OSA. Unknown if snoring.  Sleeping 5 hrs/night.  Wife up early 5:30 sometimes he goes back to sleep. Bed at 11:30, sleep 1 hour later.  No  Does have chronic knee pain, neuropathy, treated with gabapentin, tramadol as above (2-3 per day).  Sertraline for depression/anxiety.  More anxious or down at times. Neuropathy is bothering at  night - taking 2 gapapentin now.  Some fatigue, on iron from GI.     11/30/2021    9:03 AM 08/30/2021    8:59 AM 12/26/2020   10:11 AM 11/10/2020    2:00 PM 09/02/2020    9:55 AM  Depression screen PHQ 2/9  Decreased Interest 1 0 0 0 0  Down, Depressed, Hopeless 0 0 0 0 0  PHQ - 2 Score 1 0 0 0 0  Altered sleeping 2      Tired, decreased energy 2      Change in appetite 1      Feeling bad or failure about yourself  1      Trouble concentrating 0      Moving slowly or fidgety/restless 0      Suicidal thoughts 0      PHQ-9 Score 7      Difficult doing work/chores Somewhat difficult         History 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   Trigger finger, left little finger 02/04/2020   Carpal tunnel syndrome, left upper limb 11/24/2019   Unilateral primary osteoarthritis, left knee 11/02/2019   Unilateral primary osteoarthritis, right knee 11/02/2019   Carpal tunnel syndrome, right upper limb 02/18/2018   Anxiety and depression 10/28/2016   Gastroesophageal reflux disease without esophagitis 10/28/2016   Seasonal  allergic rhinitis due to pollen 10/28/2016   Primary osteoarthritis of left knee 07/25/2015   Essential hypertension 07/25/2015   Hypercholesteremia 07/25/2015   Type 2 diabetes mellitus with diabetic polyneuropathy, without long-term current use of insulin ([place]) 07/25/2015   Pollen allergies 02/10/2014   Allergic rhinitis due to animal (cat) (dog) hair and dander 09/15/2012   Extrinsic asthma 09/13/2011   Proteinuria 11/24/2010   Past Medical History:  Diagnosis Date   Allergic rhinitis    Allergy    Anxiety    Arthritis    Benign hypertension    Cataract    Depression    Diabetes mellitus (HCC)    type 2    GERD (gastroesophageal reflux disease)    History of pneumonia    HLD (hyperlipidemia)    Neuromuscular disorder ([place])    pt denies at 8/19/visit    Pneumonia    hx of several times per  pt - last episode 4-5 years ago    Past Surgical History:  Procedure Laterality Date   CARPAL TUNNEL RELEASE Bilateral 2020 Right, 2021 left   CATARACT EXTRACTION     bilateral   EYE SURGERY     KNEE SURGERY     right   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   Procedure: LEFT TOTAL KNEE ARTHROPLASTY;  Surgeon: Mcarthur Rossetti, MD;  Location: [facility];  Service: Orthopedics;  Laterality: Left;   Allergies  Allergen Reactions   Actos [Pioglitazone] Other (See Comments), Shortness Of Breath and Swelling    Blood sugar went up and down and sweating profusely   Codeine Shortness Of Breath and Palpitations   Prior to Admission medications   Medication Sig Start Date End Date Taking? Authorizing Provider  blood glucose meter kit and supplies 1 each by Other route as directed. Dispense based on patient and insurance preference. Check once per day (variable times).  Accu check. 07/29/18  Yes Wendie Agreste, MD  cetirizine (ZYRTEC) 10 MG tablet Take 10 mg by mouth daily as needed for allergies.    Yes [provider]  Cholecalciferol (VITAMIN D) 125 MCG (5000 UT) CAPS Take 5,000 Units by mouth daily.   Yes [provider]  Cyanocobalamin (VITAMIN B 12 PO) Take 1,000 mg by mouth daily.    Yes [provider]  cyclobenzaprine (FLEXERIL) 10 MG tablet Take 1 tablet (10 mg total) by mouth 3 (three) times daily as needed for muscle spasms. 10/26/20  Yes Maximiano Coss, NP  diclofenac Sodium (VOLTAREN) 1 % GEL Apply 2 g topically daily.   Yes [provider]  ferrous sulfate 325 (65 FE) MG EC tablet Take 1 tablet (325 mg total) by mouth daily with breakfast. 10/05/21  Yes Armbruster, Carlota Raspberry, MD  fluticasone (FLONASE) 50 MCG/ACT nasal spray Place 1 spray into both nostrils daily.   Yes [provider]  gabapentin (NEURONTIN) 300 MG capsule TAKE 1 TO 2 CAPSULES BY MOUTH TWICE DAILY AS NEEDED 08/30/21  Yes Wendie Agreste, MD  glimepiride (AMARYL) 4 MG  tablet Take 1 tablet (4 mg total) by mouth 2 (two) times daily. 08/30/21  Yes Wendie Agreste, MD  hydrochlorothiazide (MICROZIDE) 12.5 MG capsule Take 1 capsule (12.5 mg total) by mouth daily. 08/30/21  Yes Wendie Agreste, MD  Lancets [facility] DELICA PLUS [id number]) MISC USE 1 TO CHECK GLUCOSE ONCE DAILY 08/04/18  Yes [provider]  liraglutide (VICTOZA) 18 MG/3ML SOPN Inject 1.8 mg into the skin daily. Start 0.101m qd for 1 week,  then 1.86m QD for 1 week, then 1.816mQD. 08/30/21  Yes GrWendie AgresteMD  lisinopril (ZESTRIL) 20 MG tablet Take 1 tablet (20 mg total) by mouth daily. 08/30/21  Yes GrWendie AgresteMD  metFORMIN (GLUCOPHAGE) 1000 MG tablet Take 1 tablet (1,000 mg total) by mouth 2 (two) times daily with a meal. 08/30/21  Yes GrWendie AgresteMD  montelukast (SINGULAIR) 10 MG tablet Take 1 tablet (10 mg total) by mouth at bedtime. 08/30/21  Yes GrWendie AgresteMD  Multiple Vitamin (MULTIVITAMIN WITH MINERALS) TABS tablet Take 1 tablet by mouth daily.   Yes [provider]  omeprazole (PRILOSEC) 20 MG capsule Take 1 capsule (20 mg total) by mouth daily. 08/30/21  Yes GrWendie AgresteMD  ondansetron (ZOFRAN ODT) 4 MG disintegrating tablet Take 1 tablet (4 mg total) by mouth every 8 (eight) hours as needed for nausea or vomiting. 02/27/20  Yes BlMcarthur RossettiMD  [facility] test strip USE 1 STRIP TO CHECK GLUCOSE ONCE DAILY 11/25/18  Yes GrWendie AgresteMD  pravastatin (PRAVACHOL) 80 MG tablet Take 1 tablet (80 mg total) by mouth daily. 08/30/21  Yes GrWendie AgresteMD  sertraline (ZOLOFT) 100 MG tablet 1 tablet daily. 08/30/21  Yes GrWendie AgresteMD  sodium chloride (OCEAN) 0.65 % SOLN nasal spray Place 1 spray into both nostrils as needed for congestion.   Yes [provider]  traMADol (ULTRAM) 50 MG tablet TAKE 1 TABLET BY MOUTH EVERY 6 HOURS AS NEEDED 11/29/21  Yes GrWendie AgresteMD  vitamin E 180 MG (400 UNITS) capsule Take 400 Units by  mouth daily.   Yes [provider]  albuterol (VENTOLIN HFA) 108 (90 Base) MCG/ACT inhaler INHALE 2 PUFFS BY MOUTH EVERY 6 HOURS AS NEEDED FOR WHEEZING OR SHORTNESS OF BREATH Patient not taking: Reported on 11/30/2021 06/08/21   MoMaximiano CossNP  aspirin EC 325 MG EC tablet Take 1 tablet (325 mg total) by mouth 2 (two) times daily after a meal. Patient taking differently: Take 81 mg by mouth 2 (two) times daily after a meal. 02/27/20   BlMcarthur RossettiMD   Social History   Socioeconomic History   Marital status: Married    Spouse name: Not on file   Number of children: 3   Years of education: 16   Highest education level: Not on file  Occupational History   Occupation: tiDealerminister  Tobacco Use   Smoking status: Never   Smokeless tobacco: Never  Vaping Use   Vaping Use: Never used  Substance and Sexual Activity   Alcohol use: No   Drug use: No   Sexual activity: Not Currently  Other Topics Concern   Not on file  Social History Narrative   Not on file   Social Determinants of Health   Financial Resource Strain: Low Risk    Difficulty of Paying Living Expenses: Not hard at all  Food Insecurity: No Food Insecurity   Worried About RuCharity fundraisern the Last Year: Never true   [place] the Last Year: Never true  Transportation Needs: No Transportation Needs   Lack of Transportation (Medical): No   Lack of Transportation (Non-Medical): No  Physical Activity: Sufficiently Active   Days of Exercise per Week: 5 days   Minutes of Exercise per Session: 40 min  Stress: No Stress Concern Present   Feeling of Stress : Not at all  Social  Connections: Socially Integrated   Frequency of Communication with Friends and Family: More than three times a week   Frequency of Social Gatherings with Friends and Family: More than three times a week   Attends Religious Services: More than 4 times per year   Active Member of [employer] or Organizations: Yes    Attends Archivist Meetings: 1 to 4 times per year   Marital Status: Married  Human resources officer Violence: Not At Risk   Fear of Current or Ex-Partner: No   Emotionally Abused: No   Physically Abused: No   Sexually Abused: No    Review of Systems Per HPI.   Objective:   Vitals:   11/30/21 0856  BP: 130/84  Pulse: 73  Resp: 16  Temp: 98.4 F (36.9 C)  TempSrc: Temporal  SpO2: 96%  Weight: 227 lb 12.8 oz (103.3 kg)  Height: '5\' 8"'  (1.727 m)     Physical Exam Vitals reviewed.  Constitutional:      Appearance: He is well-developed.  HENT:     Head: Normocephalic and atraumatic.  Neck:     Vascular: No carotid bruit or JVD.  Cardiovascular:     Rate and Rhythm: Normal rate and regular rhythm.     Heart sounds: Normal heart sounds. No murmur heard. Pulmonary:     Effort: Pulmonary effort is normal.     Breath sounds: Normal breath sounds. No rales.  Musculoskeletal:     Right lower leg: No edema.     Left lower leg: No edema.  Skin:    General: Skin is warm and dry.  Neurological:     Mental Status: He is alert and oriented to person, place, and time.  Psychiatric:        Mood and Affect: Mood normal.       Assessment & Plan:  Daniel [name] is a 71 y.o. male . Type 2 diabetes mellitus with diabetic polyneuropathy, without long-term current use of insulin (HCC) - Plan: POCT HgB A1C, Microalbumin / creatinine urine ratio  -Uncontrolled but improving A1c and improved readings recently.  Most of A1c elevation may be due to dietary indiscretion 2 to 3 months ago.  Continue same regimen for now, recheck with home readings in 1 month to decide on additional medications or changes.  Insomnia, unspecified type  -Multifactorial.  Restrict fluids at bedtime, sufficient fluids throughout the day still recommended.  -Melatonin okay if needed.  Try additional dose of gabapentin if neuropathic symptoms at bedtime.  -Sleep hygiene discussed, handout given.   Earlier bedtime discussed.  -Anxiety/depression may also be contributing, higher dose of Zoloft below.  Option to dose in the morning if that is causing his insomnia.  Depression, unspecified depression type - Plan: sertraline (ZOLOFT) 50 MG tablet  -Decreased control, add additional 50 mg Zoloft for total dose 150 mg daily, recheck 1 month.  Meds ordered this encounter  Medications   sertraline (ZOLOFT) 50 MG tablet    Sig: Take 1 tablet (50 mg total) by mouth daily. Total dose 166m.    Dispense:  30 tablet    Refill:  3   Patient Instructions  See info below on sleep.  Try earlier bedtime. Increase zoloft to total doe 1561mper day - option to take in am instead if that is keeping you awake.  Try 3 gabapentin in the evening for now, restrict fluids 1 hour before bedtime to see if less frequent wakening to urinate. Recheck in 1 month.  Continue  to watch carbs for diabetes - see diet info below. We  can discuss possible added med in 1 month depending on readings then.    Diabetes Mellitus and Nutrition, Adult When you have diabetes, or diabetes mellitus, it is very important to have healthy eating habits because your blood sugar (glucose) levels are greatly affected by what you eat and drink. Eating healthy foods in the right amounts, at about the same times every day, can help you: Manage your blood glucose. Lower your risk of heart disease. Improve your blood pressure. Reach or maintain a healthy weight. What can affect my meal plan? Every person with diabetes is different, and each person has different needs for a meal plan. Your health care provider may recommend that you work with a dietitian to make a meal plan that is best for you. Your meal plan may vary depending on factors such as: The calories you need. The medicines you take. Your weight. Your blood glucose, blood pressure, and cholesterol levels. Your activity level. Other health conditions you have, such as heart or  kidney disease. How do carbohydrates affect me? Carbohydrates, also called carbs, affect your blood glucose level more than any other type of food. Eating carbs raises the amount of glucose in your blood. It is important to know how many carbs you can safely have in each meal. This is different for every person. Your dietitian can help you calculate how many carbs you should have at each meal and for each snack. How does alcohol affect me? Alcohol can cause a decrease in blood glucose (hypoglycemia), especially if you use insulin or take certain diabetes medicines by mouth. Hypoglycemia can be a life-threatening condition. Symptoms of hypoglycemia, such as sleepiness, dizziness, and confusion, are similar to symptoms of having too much alcohol. Do not drink alcohol if: Your health care provider tells you not to drink. You are pregnant, may be pregnant, or are planning to become pregnant. If you drink alcohol: Limit how much you have to: 0-1 drink a day for women. 0-2 drinks a day for men. Know how much alcohol is in your drink. In the [place] one drink equals one 12 oz bottle of beer (355 mL), one 5 oz glass of wine (148 mL), or one 1 oz glass of hard liquor (44 mL). Keep yourself hydrated with water, diet soda, or unsweetened iced tea. Keep in mind that regular soda, juice, and other mixers may contain a lot of sugar and must be counted as carbs. What are tips for following this plan?  Reading food labels Start by checking the serving size on the Nutrition Facts label of packaged foods and drinks. The number of calories and the amount of carbs, fats, and other nutrients listed on the label are based on one serving of the item. Many items contain more than one serving per package. Check the total grams (g) of carbs in one serving. Check the number of grams of saturated fats and trans fats in one serving. Choose foods that have a low amount or none of these fats. Check the number of milligrams  (mg) of salt (sodium) in one serving. Most people should limit total sodium intake to less than 2,300 mg per day. Always check the nutrition information of foods labeled as "low-fat" or "nonfat." These foods may be higher in added sugar or refined carbs and should be avoided. Talk to your dietitian to identify your daily goals for nutrients listed on the label. Shopping Avoid buying canned, pre-made,  or processed foods. These foods tend to be high in fat, sodium, and added sugar. Shop around the outside edge of the grocery store. This is where you will most often find fresh fruits and vegetables, bulk grains, fresh meats, and fresh dairy products. Cooking Use low-heat cooking methods, such as baking, instead of high-heat cooking methods, such as deep frying. Cook using healthy oils, such as olive, canola, or sunflower oil. Avoid cooking with butter, cream, or high-fat meats. Meal planning Eat meals and snacks regularly, preferably at the same times every day. Avoid going long periods of time without eating. Eat foods that are high in fiber, such as fresh fruits, vegetables, beans, and whole grains. Eat 4-6 oz (112-168 g) of lean protein each day, such as lean meat, chicken, fish, eggs, or tofu. One ounce (oz) (28 g) of lean protein is equal to: 1 oz (28 g) of meat, chicken, or fish. 1 egg.  cup (62 g) of tofu. Eat some foods each day that contain healthy fats, such as avocado, nuts, seeds, and fish. What foods should I eat? Fruits Berries. Apples. Oranges. Peaches. Apricots. Plums. Grapes. Mangoes. Papayas. Pomegranates. Kiwi. Cherries. Vegetables Leafy greens, including lettuce, spinach, kale, chard, collard greens, mustard greens, and cabbage. Beets. Cauliflower. Broccoli. Carrots. Green beans. Tomatoes. Peppers. Onions. Cucumbers. Brussels sprouts. Grains Whole grains, such as whole-wheat or whole-grain bread, crackers, tortillas, cereal, and pasta. Unsweetened oatmeal. Quinoa. Brown or  wild rice. Meats and other proteins Seafood. Poultry without skin. Lean cuts of poultry and beef. Tofu. Nuts. Seeds. Dairy Low-fat or fat-free dairy products such as milk, yogurt, and cheese. The items listed above may not be a complete list of foods and beverages you can eat and drink. Contact a dietitian for more information. What foods should I avoid? Fruits Fruits canned with syrup. Vegetables Canned vegetables. Frozen vegetables with butter or cream sauce. Grains Refined white flour and flour products such as bread, pasta, snack foods, and cereals. Avoid all processed foods. Meats and other proteins Fatty cuts of meat. Poultry with skin. Breaded or fried meats. Processed meat. Avoid saturated fats. Dairy Full-fat yogurt, cheese, or milk. Beverages Sweetened drinks, such as soda or iced tea. The items listed above may not be a complete list of foods and beverages you should avoid. Contact a dietitian for more information. Questions to ask a health care provider Do I need to meet with a certified diabetes care and education specialist? Do I need to meet with a dietitian? What number can I call if I have questions? When are the best times to check my blood glucose? Where to find more information: American Diabetes Association: diabetes.org Academy of Nutrition and Dietetics: eatright.[employer] of Diabetes and Digestive and Kidney Diseases: [employer] Association of Diabetes Care & Education Specialists: diabeteseducator.org Summary It is important to have healthy eating habits because your blood sugar (glucose) levels are greatly affected by what you eat and drink. It is important to use alcohol carefully. A healthy meal plan will help you manage your blood glucose and lower your risk of heart disease. Your health care provider may recommend that you work with a dietitian to make a meal plan that is best for you. This information is not intended to replace advice  given to you by your health care provider. Make sure you discuss any questions you have with your health care provider. Document Revised: 01/20/2020 Document Reviewed: 01/20/2020 Elsevier Patient Education  [place].    Insomnia Insomnia is a sleep  disorder that makes it difficult to fall asleep or stay asleep. Insomnia can cause fatigue, low energy, difficulty concentrating, mood swings, and poor performance at work or school. There are three different ways to classify insomnia: Difficulty falling asleep. Difficulty staying asleep. Waking up too early in the morning. Any type of insomnia can be long-term (chronic) or short-term (acute). Both are common. Short-term insomnia usually lasts for 3 months or less. Chronic insomnia occurs at least three times a week for longer than 3 months. What are the causes? Insomnia may be caused by another condition, situation, or substance, such as: Having certain mental health conditions, such as anxiety and depression. Using caffeine, alcohol, tobacco, or drugs. Having gastrointestinal conditions, such as gastroesophageal reflux disease (GERD). Having certain medical conditions. These include: Asthma. Alzheimer's disease. Stroke. Chronic pain. An overactive thyroid gland (hyperthyroidism). Other sleep disorders, such as restless legs syndrome and sleep apnea. Menopause. Sometimes, the cause of insomnia may not be known. What increases the risk? Risk factors for insomnia include: Gender. Females are affected more often than males. Age. Insomnia is more common as people get older. Stress and certain medical and mental health conditions. Lack of exercise. Having an irregular work schedule. This may include working night shifts and traveling between different time zones. What are the signs or symptoms? If you have insomnia, the main symptom is having trouble falling asleep or having trouble staying asleep. This may lead to other symptoms,  such as: Feeling tired or having low energy. Feeling nervous about going to sleep. Not feeling rested in the morning. Having trouble concentrating. Feeling irritable, anxious, or depressed. How is this diagnosed? This condition may be diagnosed based on: Your symptoms and medical history. Your health care provider may ask about: Your sleep habits. Any medical conditions you have. Your mental health. A physical exam. How is this treated? Treatment for insomnia depends on the cause. Treatment may focus on treating an underlying condition that is causing the insomnia. Treatment may also include: Medicines to help you sleep. Counseling or therapy. Lifestyle adjustments to help you sleep better. Follow these instructions at home: Eating and drinking  Limit or avoid alcohol, caffeinated beverages, and products that contain nicotine and tobacco, especially close to bedtime. These can disrupt your sleep. Do not eat a large meal or eat spicy foods right before bedtime. This can lead to digestive discomfort that can make it hard for you to sleep. Sleep habits  Keep a sleep diary to help you and your health care provider figure out what could be causing your insomnia. Write down: When you sleep. When you wake up during the night. How well you sleep and how rested you feel the next day. Any side effects of medicines you are taking. What you eat and drink. Make your bedroom a dark, comfortable place where it is easy to fall asleep. Put up shades or blackout curtains to block light from outside. Use a white noise machine to block noise. Keep the temperature cool. Limit screen use before bedtime. This includes: Not watching TV. Not using your smartphone, tablet, or computer. Stick to a routine that includes going to bed and waking up at the same times every day and night. This can help you fall asleep faster. Consider making a quiet activity, such as reading, part of your nighttime  routine. Try to avoid taking naps during the day so that you sleep better at night. Get out of bed if you are still awake after 15 minutes of trying  to sleep. Keep the lights down, but try reading or doing a quiet activity. When you feel sleepy, go back to bed. General instructions Take over-the-counter and prescription medicines only as told by your health care provider. Exercise regularly as told by your health care provider. However, avoid exercising in the hours right before bedtime. Use relaxation techniques to manage stress. Ask your health care provider to suggest some techniques that may work well for you. These may include: Breathing exercises. Routines to release muscle tension. Visualizing peaceful scenes. Make sure that you drive carefully. Do not drive if you feel very sleepy. Keep all follow-up visits. This is important. Contact a health care provider if: You are tired throughout the day. You have trouble in your daily routine due to sleepiness. You continue to have sleep problems, or your sleep problems get worse. Get help right away if: You have thoughts about hurting yourself or someone else. Get help right away if you feel like you may hurt yourself or others, or have thoughts about taking your own life. Go to your nearest emergency room or: Call 911. Call the [facility] at [phone] or 988. This is open 24 hours a day. Text the Crisis Text Line at [phone]. Summary Insomnia is a sleep disorder that makes it difficult to fall asleep or stay asleep. Insomnia can be long-term (chronic) or short-term (acute). Treatment for insomnia depends on the cause. Treatment may focus on treating an underlying condition that is causing the insomnia. Keep a sleep diary to help you and your health care provider figure out what could be causing your insomnia. This information is not intended to replace advice given to you by your health care provider. Make sure  you discuss any questions you have with your health care provider. Document Revised: 05/29/2021 Document Reviewed: 05/29/2021 Elsevier Patient Education  2023 [place],   Merri Ray, MD [place] 11/30/21 9:57 AM

## 2021-11-30 NOTE — Patient Instructions (Addendum)
See info below on sleep.  Try earlier bedtime. Increase zoloft to total doe '150mg'$  per day - option to take in am instead if that is keeping you awake.  Try 3 gabapentin in the evening for now, restrict fluids 1 hour before bedtime to see if less frequent wakening to urinate. Recheck in 1 month.  Continue to watch carbs for diabetes - see diet info below. We  can discuss possible added med in 1 month depending on readings then.    Diabetes Mellitus and Nutrition, Adult When you have diabetes, or diabetes mellitus, it is very important to have healthy eating habits because your blood sugar (glucose) levels are greatly affected by what you eat and drink. Eating healthy foods in the right amounts, at about the same times every day, can help you: Manage your blood glucose. Lower your risk of heart disease. Improve your blood pressure. Reach or maintain a healthy weight. What can affect my meal plan? Every person with diabetes is different, and each person has different needs for a meal plan. Your health care provider may recommend that you work with a dietitian to make a meal plan that is best for you. Your meal plan may vary depending on factors such as: The calories you need. The medicines you take. Your weight. Your blood glucose, blood pressure, and cholesterol levels. Your activity level. Other health conditions you have, such as heart or kidney disease. How do carbohydrates affect me? Carbohydrates, also called carbs, affect your blood glucose level more than any other type of food. Eating carbs raises the amount of glucose in your blood. It is important to know how many carbs you can safely have in each meal. This is different for every person. Your dietitian can help you calculate how many carbs you should have at each meal and for each snack. How does alcohol affect me? Alcohol can cause a decrease in blood glucose (hypoglycemia), especially if you use insulin or take certain diabetes  medicines by mouth. Hypoglycemia can be a life-threatening condition. Symptoms of hypoglycemia, such as sleepiness, dizziness, and confusion, are similar to symptoms of having too much alcohol. Do not drink alcohol if: Your health care provider tells you not to drink. You are pregnant, may be pregnant, or are planning to become pregnant. If you drink alcohol: Limit how much you have to: 0-1 drink a day for women. 0-2 drinks a day for men. Know how much alcohol is in your drink. In the [place] one drink equals one 12 oz bottle of beer (355 mL), one 5 oz glass of wine (148 mL), or one 1 oz glass of hard liquor (44 mL). Keep yourself hydrated with water, diet soda, or unsweetened iced tea. Keep in mind that regular soda, juice, and other mixers may contain a lot of sugar and must be counted as carbs. What are tips for following this plan?  Reading food labels Start by checking the serving size on the Nutrition Facts label of packaged foods and drinks. The number of calories and the amount of carbs, fats, and other nutrients listed on the label are based on one serving of the item. Many items contain more than one serving per package. Check the total grams (g) of carbs in one serving. Check the number of grams of saturated fats and trans fats in one serving. Choose foods that have a low amount or none of these fats. Check the number of milligrams (mg) of salt (sodium) in one serving. Most people should  limit total sodium intake to less than 2,300 mg per day. Always check the nutrition information of foods labeled as "low-fat" or "nonfat." These foods may be higher in added sugar or refined carbs and should be avoided. Talk to your dietitian to identify your daily goals for nutrients listed on the label. Shopping Avoid buying canned, pre-made, or processed foods. These foods tend to be high in fat, sodium, and added sugar. Shop around the outside edge of the grocery store. This is where you will most  often find fresh fruits and vegetables, bulk grains, fresh meats, and fresh dairy products. Cooking Use low-heat cooking methods, such as baking, instead of high-heat cooking methods, such as deep frying. Cook using healthy oils, such as olive, canola, or sunflower oil. Avoid cooking with butter, cream, or high-fat meats. Meal planning Eat meals and snacks regularly, preferably at the same times every day. Avoid going long periods of time without eating. Eat foods that are high in fiber, such as fresh fruits, vegetables, beans, and whole grains. Eat 4-6 oz (112-168 g) of lean protein each day, such as lean meat, chicken, fish, eggs, or tofu. One ounce (oz) (28 g) of lean protein is equal to: 1 oz (28 g) of meat, chicken, or fish. 1 egg.  cup (62 g) of tofu. Eat some foods each day that contain healthy fats, such as avocado, nuts, seeds, and fish. What foods should I eat? Fruits Berries. Apples. Oranges. Peaches. Apricots. Plums. Grapes. Mangoes. Papayas. Pomegranates. Kiwi. Cherries. Vegetables Leafy greens, including lettuce, spinach, kale, chard, collard greens, mustard greens, and cabbage. Beets. Cauliflower. Broccoli. Carrots. Green beans. Tomatoes. Peppers. Onions. Cucumbers. Brussels sprouts. Grains Whole grains, such as whole-wheat or whole-grain bread, crackers, tortillas, cereal, and pasta. Unsweetened oatmeal. Quinoa. Brown or wild rice. Meats and other proteins Seafood. Poultry without skin. Lean cuts of poultry and beef. Tofu. Nuts. Seeds. Dairy Low-fat or fat-free dairy products such as milk, yogurt, and cheese. The items listed above may not be a complete list of foods and beverages you can eat and drink. Contact a dietitian for more information. What foods should I avoid? Fruits Fruits canned with syrup. Vegetables Canned vegetables. Frozen vegetables with butter or cream sauce. Grains Refined white flour and flour products such as bread, pasta, snack foods, and  cereals. Avoid all processed foods. Meats and other proteins Fatty cuts of meat. Poultry with skin. Breaded or fried meats. Processed meat. Avoid saturated fats. Dairy Full-fat yogurt, cheese, or milk. Beverages Sweetened drinks, such as soda or iced tea. The items listed above may not be a complete list of foods and beverages you should avoid. Contact a dietitian for more information. Questions to ask a health care provider Do I need to meet with a certified diabetes care and education specialist? Do I need to meet with a dietitian? What number can I call if I have questions? When are the best times to check my blood glucose? Where to find more information: American Diabetes Association: diabetes.org Academy of Nutrition and Dietetics: eatright.[employer] of Diabetes and Digestive and Kidney Diseases: [employer] Association of Diabetes Care & Education Specialists: diabeteseducator.org Summary It is important to have healthy eating habits because your blood sugar (glucose) levels are greatly affected by what you eat and drink. It is important to use alcohol carefully. A healthy meal plan will help you manage your blood glucose and lower your risk of heart disease. Your health care provider may recommend that you work with a dietitian to make  a meal plan that is best for you. This information is not intended to replace advice given to you by your health care provider. Make sure you discuss any questions you have with your health care provider. Document Revised: 01/20/2020 Document Reviewed: 01/20/2020 Elsevier Patient Education  [place].    Insomnia Insomnia is a sleep disorder that makes it difficult to fall asleep or stay asleep. Insomnia can cause fatigue, low energy, difficulty concentrating, mood swings, and poor performance at work or school. There are three different ways to classify insomnia: Difficulty falling asleep. Difficulty staying  asleep. Waking up too early in the morning. Any type of insomnia can be long-term (chronic) or short-term (acute). Both are common. Short-term insomnia usually lasts for 3 months or less. Chronic insomnia occurs at least three times a week for longer than 3 months. What are the causes? Insomnia may be caused by another condition, situation, or substance, such as: Having certain mental health conditions, such as anxiety and depression. Using caffeine, alcohol, tobacco, or drugs. Having gastrointestinal conditions, such as gastroesophageal reflux disease (GERD). Having certain medical conditions. These include: Asthma. Alzheimer's disease. Stroke. Chronic pain. An overactive thyroid gland (hyperthyroidism). Other sleep disorders, such as restless legs syndrome and sleep apnea. Menopause. Sometimes, the cause of insomnia may not be known. What increases the risk? Risk factors for insomnia include: Gender. Females are affected more often than males. Age. Insomnia is more common as people get older. Stress and certain medical and mental health conditions. Lack of exercise. Having an irregular work schedule. This may include working night shifts and traveling between different time zones. What are the signs or symptoms? If you have insomnia, the main symptom is having trouble falling asleep or having trouble staying asleep. This may lead to other symptoms, such as: Feeling tired or having low energy. Feeling nervous about going to sleep. Not feeling rested in the morning. Having trouble concentrating. Feeling irritable, anxious, or depressed. How is this diagnosed? This condition may be diagnosed based on: Your symptoms and medical history. Your health care provider may ask about: Your sleep habits. Any medical conditions you have. Your mental health. A physical exam. How is this treated? Treatment for insomnia depends on the cause. Treatment may focus on treating an underlying  condition that is causing the insomnia. Treatment may also include: Medicines to help you sleep. Counseling or therapy. Lifestyle adjustments to help you sleep better. Follow these instructions at home: Eating and drinking  Limit or avoid alcohol, caffeinated beverages, and products that contain nicotine and tobacco, especially close to bedtime. These can disrupt your sleep. Do not eat a large meal or eat spicy foods right before bedtime. This can lead to digestive discomfort that can make it hard for you to sleep. Sleep habits  Keep a sleep diary to help you and your health care provider figure out what could be causing your insomnia. Write down: When you sleep. When you wake up during the night. How well you sleep and how rested you feel the next day. Any side effects of medicines you are taking. What you eat and drink. Make your bedroom a dark, comfortable place where it is easy to fall asleep. Put up shades or blackout curtains to block light from outside. Use a white noise machine to block noise. Keep the temperature cool. Limit screen use before bedtime. This includes: Not watching TV. Not using your smartphone, tablet, or computer. Stick to a routine that includes going to bed and waking  up at the same times every day and night. This can help you fall asleep faster. Consider making a quiet activity, such as reading, part of your nighttime routine. Try to avoid taking naps during the day so that you sleep better at night. Get out of bed if you are still awake after 15 minutes of trying to sleep. Keep the lights down, but try reading or doing a quiet activity. When you feel sleepy, go back to bed. General instructions Take over-the-counter and prescription medicines only as told by your health care provider. Exercise regularly as told by your health care provider. However, avoid exercising in the hours right before bedtime. Use relaxation techniques to manage stress. Ask your  health care provider to suggest some techniques that may work well for you. These may include: Breathing exercises. Routines to release muscle tension. Visualizing peaceful scenes. Make sure that you drive carefully. Do not drive if you feel very sleepy. Keep all follow-up visits. This is important. Contact a health care provider if: You are tired throughout the day. You have trouble in your daily routine due to sleepiness. You continue to have sleep problems, or your sleep problems get worse. Get help right away if: You have thoughts about hurting yourself or someone else. Get help right away if you feel like you may hurt yourself or others, or have thoughts about taking your own life. Go to your nearest emergency room or: Call 911. Call the [facility] at ([phone] or 988. This is open 24 hours a day. Text the Crisis Text Line at 914-327-3369. Summary Insomnia is a sleep disorder that makes it difficult to fall asleep or stay asleep. Insomnia can be long-term (chronic) or short-term (acute). Treatment for insomnia depends on the cause. Treatment may focus on treating an underlying condition that is causing the insomnia. Keep a sleep diary to help you and your health care provider figure out what could be causing your insomnia. This information is not intended to replace advice given to you by your health care provider. Make sure you discuss any questions you have with your health care provider. Document Revised: 05/29/2021 Document Reviewed: 05/29/2021 Elsevier Patient Education  [place].

## 2021-12-01 LAB — MICROALBUMIN / CREATININE URINE RATIO
Creatinine,U: 122.8 mg/dL
Microalb Creat Ratio: 1.2 mg/g (ref 0.0–30.0)
Microalb, Ur: 1.4 mg/dL (ref 0.0–1.9)

## 2021-12-27 ENCOUNTER — Encounter: Payer: Self-pay | Admitting: Gastroenterology

## 2022-01-09 ENCOUNTER — Telehealth: Payer: Self-pay | Admitting: Family Medicine

## 2022-01-09 NOTE — Telephone Encounter (Signed)
Left message for patient to call back and schedule Medicare Annual Wellness Visit (AWV).   Please offer to do virtually or by telephone.  Left office number and my jabber [phone].  Last AWV:12/26/2020  Please schedule at anytime with Nurse Health Advisor.

## 2022-01-12 ENCOUNTER — Ambulatory Visit (INDEPENDENT_AMBULATORY_CARE_PROVIDER_SITE_OTHER): Payer: Medicare Other | Admitting: Family Medicine

## 2022-01-12 ENCOUNTER — Encounter: Payer: Self-pay | Admitting: Family Medicine

## 2022-01-12 VITALS — BP 138/78 | HR 73 | Temp 98.1°F | Resp 17

## 2022-01-12 DIAGNOSIS — F32A Depression, unspecified: Secondary | ICD-10-CM

## 2022-01-12 DIAGNOSIS — G4700 Insomnia, unspecified: Secondary | ICD-10-CM | POA: Diagnosis not present

## 2022-01-12 DIAGNOSIS — E1142 Type 2 diabetes mellitus with diabetic polyneuropathy: Secondary | ICD-10-CM | POA: Diagnosis not present

## 2022-01-12 NOTE — Patient Instructions (Addendum)
No med changes for now. Continue to watch diet and be careful with foods that increase your blood sugar. I would not recommend any med changes at this time with some of the lower readings. Make sure not to miss meals.  Have healthier foods available if needed for snacking/stress eating.   Continue to be careful with fluids prior to bedtime.   Follow up in 2 months, but let me know if questions sooner.

## 2022-01-12 NOTE — Progress Notes (Signed)
Subjective:  Patient ID: Daniel [name], male    DOB: 07-05-1950  Age: 71 y.o. MRN: [id number]  CC:  Chief Complaint  Patient presents with   Diabetes   Insomnia    Notes much improved    Depression    Pt notes also improved mood, notes tendency to overeat or anxious eating     HPI Daniel [name] presents for   Diabetes: Complicated by hyperglycemia, polyneuropathy.  Treated with Victoza, metformin, glimepiride, and on statin/ACE inhibitor.  Gabapentin 300 mg 2 at night for neuropathy symptoms.  On tramadol for chronic knee pain.  Last visit in June, glycemic control improving.  Uncontrolled by prior A1c, but thought to be in part due to some missed medications prior and then dietary indiscretion.  Handout given on diabetes and nutrition last visit.  Weight has improved by 7 pounds since last visit. Home readings fasting:73-202. Usually low to mid 100's. 130, 117.158 past few days.  Home readings postprandial: 160 Symptomatic lows - low of 64 on 6/2. Possible light meal night prior, no symptoms, few 70's, no symptomatic lows.  Victoza has been costly. May need to look at alternatives. Refilled yesterday.  Microalbumin: Normal 11/30/2021 Optho, foot exam, pneumovax:  Plan to make ophtho appointment last visit. Lab Results  Component Value Date   HGBA1C 8.4 (A) 11/30/2021   HGBA1C 9.4 (H) 08/30/2021   HGBA1C 9.1 Repeated and verified X2. (H) 11/10/2020   Lab Results  Component Value Date   MICROALBUR 1.4 11/30/2021   LDLCALC 148 (H) 08/30/2021   CREATININE 1.34 08/30/2021   Wt Readings from Last 3 Encounters:  11/30/21 227 lb 12.8 oz (103.3 kg)  11/21/21 234 lb (106.1 kg)  10/03/21 234 lb (106.1 kg)    Depression with insomnia Discussed at June 1 visit.  Thought to be multifactorial with insomnia.  Recommended fluid restriction before bedtime to lessen nocturia, gabapentin increased to 3 pills in the evening for possible neuropathic symptoms/pain cause, and is Zoloft  increased to a total dose of 150 mg daily with option of a.m. dosing.  Earlier bedtime discussed as well as other sleep hygiene. Sleeping better. Nocturia 1-3 per night depending on fluid intake.  3 gabapentin at night helps as well. No new side effects.  Mood better on higher dose zoloft.  Some stress eating. Dark chocolate at times.   Dry skin left ear canal, outer part - plans to try aveeno and follow up if persists.   History 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   Trigger finger, left little finger 02/04/2020   Carpal tunnel syndrome, left upper limb 11/24/2019   Unilateral primary osteoarthritis, left knee 11/02/2019   Unilateral primary osteoarthritis, right knee 11/02/2019   Carpal tunnel syndrome, right upper limb 02/18/2018   Anxiety and depression 10/28/2016   Gastroesophageal reflux disease without esophagitis 10/28/2016   Seasonal allergic rhinitis due to pollen 10/28/2016   Primary osteoarthritis of left knee 07/25/2015   Essential hypertension 07/25/2015   Hypercholesteremia 07/25/2015   Type 2 diabetes mellitus with diabetic polyneuropathy, without long-term current use of insulin ([place]) 07/25/2015   Pollen allergies 02/10/2014   Allergic rhinitis due to animal (cat) (dog) hair and dander 09/15/2012   Extrinsic asthma 09/13/2011   Proteinuria 11/24/2010   Past Medical History:  Diagnosis Date   Allergic rhinitis    Allergy    Anxiety    Arthritis    Benign hypertension  Cataract    Depression    Diabetes mellitus ([place])    type 2    GERD (gastroesophageal reflux disease)    History of pneumonia    HLD (hyperlipidemia)    Neuromuscular disorder ([place])    pt denies at 8/19/visit    Pneumonia    hx of several times per pt - last episode 4-5 years ago    Past Surgical History:  Procedure Laterality Date   CARPAL TUNNEL RELEASE Bilateral 2020 Right, 2021 left   CATARACT EXTRACTION      bilateral   EYE SURGERY     KNEE SURGERY     right   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   Procedure: LEFT TOTAL KNEE ARTHROPLASTY;  Surgeon: Mcarthur Rossetti, MD;  Location: [facility];  Service: Orthopedics;  Laterality: Left;   Allergies  Allergen Reactions   Actos [Pioglitazone] Other (See Comments), Shortness Of Breath and Swelling    Blood sugar went up and down and sweating profusely   Codeine Shortness Of Breath and Palpitations   Prior to Admission medications   Medication Sig Start Date End Date Taking? Authorizing Provider  aspirin EC 325 MG EC tablet Take 1 tablet (325 mg total) by mouth 2 (two) times daily after a meal. Patient taking differently: Take 81 mg by mouth 2 (two) times daily after a meal. 02/27/20  Yes Mcarthur Rossetti, MD  blood glucose meter kit and supplies 1 each by Other route as directed. Dispense based on patient and insurance preference. Check once per day (variable times).  Accu check. 07/29/18  Yes Wendie Agreste, MD  cetirizine (ZYRTEC) 10 MG tablet Take 10 mg by mouth daily as needed for allergies.    Yes [provider]  Cholecalciferol (VITAMIN D) 125 MCG (5000 UT) CAPS Take 5,000 Units by mouth daily.   Yes [provider]  Cyanocobalamin (VITAMIN B 12 PO) Take 1,000 mg by mouth daily.    Yes [provider]  cyclobenzaprine (FLEXERIL) 10 MG tablet Take 1 tablet (10 mg total) by mouth 3 (three) times daily as needed for muscle spasms. 10/26/20  Yes Maximiano Coss, NP  diclofenac Sodium (VOLTAREN) 1 % GEL Apply 2 g topically daily.   Yes [provider]  ferrous sulfate 325 (65 FE) MG EC tablet Take 1 tablet (325 mg total) by mouth daily with breakfast. 10/05/21  Yes Armbruster, Carlota Raspberry, MD  fluticasone (FLONASE) 50 MCG/ACT nasal spray Place 1 spray into both nostrils daily.   Yes [provider]  gabapentin (NEURONTIN) 300 MG capsule TAKE 1 TO 2 CAPSULES BY MOUTH TWICE DAILY AS NEEDED 08/30/21   Yes Wendie Agreste, MD  glimepiride (AMARYL) 4 MG tablet Take 1 tablet (4 mg total) by mouth 2 (two) times daily. 08/30/21  Yes Wendie Agreste, MD  hydrochlorothiazide (MICROZIDE) 12.5 MG capsule Take 1 capsule (12.5 mg total) by mouth daily. 08/30/21  Yes Wendie Agreste, MD  Lancets [facility] DELICA PLUS [id number]) MISC USE 1 TO CHECK GLUCOSE ONCE DAILY 08/04/18  Yes [provider]  liraglutide (VICTOZA) 18 MG/3ML SOPN Inject 1.8 mg into the skin daily. Start 0.$RemoveBefore'6mg'PueVrBDEZLCAh$  qd for 1 week, then 1.$RemoveBefo'2mg'fjjgsOlCxNC$  QD for 1 week, then 1.$RemoveBefo'8mg'lcFhDGafLTA$  QD. 08/30/21  Yes Wendie Agreste, MD  lisinopril (ZESTRIL) 20 MG tablet Take 1 tablet (20 mg total) by mouth daily. 08/30/21  Yes Wendie Agreste, MD  metFORMIN (GLUCOPHAGE) 1000 MG tablet Take 1 tablet (1,000 mg total) by mouth 2 (two) times  daily with a meal. 08/30/21  Yes Wendie Agreste, MD  montelukast (SINGULAIR) 10 MG tablet Take 1 tablet (10 mg total) by mouth at bedtime. 08/30/21  Yes Wendie Agreste, MD  Multiple Vitamin (MULTIVITAMIN WITH MINERALS) TABS tablet Take 1 tablet by mouth daily.   Yes [provider]  omeprazole (PRILOSEC) 20 MG capsule Take 1 capsule (20 mg total) by mouth daily. 08/30/21  Yes Wendie Agreste, MD  ondansetron (ZOFRAN ODT) 4 MG disintegrating tablet Take 1 tablet (4 mg total) by mouth every 8 (eight) hours as needed for nausea or vomiting. 02/27/20  Yes Mcarthur Rossetti, MD  [facility] ULTRA test strip USE 1 STRIP TO CHECK GLUCOSE ONCE DAILY 11/25/18  Yes Wendie Agreste, MD  pravastatin (PRAVACHOL) 80 MG tablet Take 1 tablet (80 mg total) by mouth daily. 08/30/21  Yes Wendie Agreste, MD  sertraline (ZOLOFT) 100 MG tablet 1 tablet daily. 08/30/21  Yes Wendie Agreste, MD  sertraline (ZOLOFT) 50 MG tablet Take 1 tablet (50 mg total) by mouth daily. Total dose 165m. 11/30/21  Yes GWendie Agreste MD  sodium chloride (OCEAN) 0.65 % SOLN nasal spray Place 1 spray into both nostrils as needed for congestion.   Yes [provider]  traMADol (ULTRAM) 50 MG tablet TAKE 1 TABLET BY MOUTH EVERY 6 HOURS AS NEEDED 11/29/21  Yes GWendie Agreste MD  vitamin E 180 MG (400 UNITS) capsule Take 400 Units by mouth daily.   Yes [provider]  albuterol (VENTOLIN HFA) 108 (90 Base) MCG/ACT inhaler INHALE 2 PUFFS BY MOUTH EVERY 6 HOURS AS NEEDED FOR WHEEZING OR SHORTNESS OF BREATH Patient not taking: Reported on 11/30/2021 06/08/21   MMaximiano Coss NP   Social History   Socioeconomic History   Marital status: Married    Spouse name: Not on file   Number of children: 3   Years of education: 16   Highest education level: Not on file  Occupational History   Occupation: tDealer mCompany secretary Tobacco Use   Smoking status: Never   Smokeless tobacco: Never  Vaping Use   Vaping Use: Never used  Substance and Sexual Activity   Alcohol use: No   Drug use: No   Sexual activity: Not Currently  Other Topics Concern   Not on file  Social History Narrative   Not on file   Social Determinants of Health   Financial Resource Strain: Low Risk  (12/26/2020)   Overall Financial Resource Strain (CARDIA)    Difficulty of Paying Living Expenses: Not hard at all  Food Insecurity: No Food Insecurity (12/26/2020)   Hunger Vital Sign    Worried About Running Out of Food in the Last Year: Never true    RFultonin the Last Year: Never true  Transportation Needs: No Transportation Needs (12/26/2020)   PRAPARE - THydrologist(Medical): No    Lack of Transportation (Non-Medical): No  Physical Activity: Sufficiently Active (12/26/2020)   Exercise Vital Sign    Days of Exercise per Week: 5 days    Minutes of Exercise per Session: 40 min  Stress: No Stress Concern Present (12/26/2020)   [place]   Feeling of Stress : Not at all  Social Connections: [place](12/26/2020)   Social Connection and Isolation  Panel [NHANES]    Frequency of Communication with Friends and Family: More than three times a  week    Frequency of Social Gatherings with Friends and Family: More than three times a week    Attends Religious Services: More than 4 times per year    Active Member of [employer] or Organizations: Yes    Attends Archivist Meetings: 1 to 4 times per year    Marital Status: Married  Human resources officer Violence: Not At Risk (12/26/2020)   Humiliation, Afraid, Rape, and Kick questionnaire    Fear of Current or Ex-Partner: No    Emotionally Abused: No    Physically Abused: No    Sexually Abused: No    Review of Systems   Objective:   Vitals:   01/12/22 0821  BP: 138/78  Pulse: 73  Resp: 17  Temp: 98.1 F (36.7 C)  TempSrc: Oral  SpO2: 97%     Physical Exam Vitals reviewed.  Constitutional:      Appearance: He is well-developed.  HENT:     Head: Normocephalic and atraumatic.     Ears:     Comments: Dry skin at entrance to canal, no significant cerumen. No wounds, or erythema.  Neck:     Vascular: No carotid bruit or JVD.  Cardiovascular:     Rate and Rhythm: Normal rate and regular rhythm.     Heart sounds: Normal heart sounds. No murmur heard. Pulmonary:     Effort: Pulmonary effort is normal.     Breath sounds: Normal breath sounds. No rales.  Musculoskeletal:     Right lower leg: No edema.     Left lower leg: No edema.  Skin:    General: Skin is warm and dry.  Neurological:     Mental Status: He is alert and oriented to person, place, and time.  Psychiatric:        Mood and Affect: Mood normal.        Assessment & Plan:  Daniel [name] is a 71 y.o. male . Type 2 diabetes mellitus with diabetic polyneuropathy, without long-term current use of insulin (HCC)  -Variable control but suspect related to diet.  With some lower readings will avoid any new meds for now.  Victoza potentially may be cost prohibitive, we will look into meeting with pharmacist to look  at options.  No med changes for now.  Continue watch diet and healthier snack options discussed.  Insomnia, unspecified type Depression, unspecified depression type  -Improved, continue same dose Zoloft.  Continue higher dose gabapentin, fluid restrictions prior to bedtime, recheck 2 months.  No orders of the defined types were placed in this encounter.  Patient Instructions  No med changes for now. Continue to watch diet and be careful with foods that increase your blood sugar. I would not recommend any med changes at this time with some of the lower readings. Make sure not to miss meals.  Have healthier foods available if needed for snacking/stress eating.   Continue to be careful with fluids prior to bedtime.   Follow up in 2 months, but let me know if questions sooner.     Signed,   Merri Ray, MD [place] 01/12/22 9:06 AM

## 2022-01-17 ENCOUNTER — Other Ambulatory Visit: Payer: Self-pay | Admitting: Family Medicine

## 2022-01-17 DIAGNOSIS — G8929 Other chronic pain: Secondary | ICD-10-CM

## 2022-01-18 NOTE — Telephone Encounter (Signed)
Controlled substance database reviewed, last filled June 1st, #120.  Chronic knee pain discussed in March.  Refill ordered.

## 2022-02-15 ENCOUNTER — Ambulatory Visit (INDEPENDENT_AMBULATORY_CARE_PROVIDER_SITE_OTHER): Payer: Medicare Other

## 2022-02-15 DIAGNOSIS — Z0000 Encounter for general adult medical examination without abnormal findings: Secondary | ICD-10-CM | POA: Diagnosis not present

## 2022-02-15 NOTE — Patient Instructions (Signed)
Daniel [name] , Thank you for taking time to come for your Medicare Wellness Visit. I appreciate your ongoing commitment to your health goals. Please review the following plan we discussed and let me know if I can assist you in the future.   Screening recommendations/referrals: Colonoscopy: 11/21/2021 Recommended yearly ophthalmology/optometry visit for glaucoma screening and checkup Recommended yearly dental visit for hygiene and checkup  Vaccinations: Influenza vaccine: completed  Pneumococcal vaccine: completed  Tdap vaccine: 12/31/2017 Shingles vaccine: completed per patient at [facility]     Advanced directives: yes   Conditions/risks identified: none   Next appointment: none   Preventive Care 51 Years and Older, Male Preventive care refers to lifestyle choices and visits with your health care provider that can promote health and wellness. What does preventive care include? A yearly physical exam. This is also called an annual well check. Dental exams once or twice a year. Routine eye exams. Ask your health care provider how often you should have your eyes checked. Personal lifestyle choices, including: Daily care of your teeth and gums. Regular physical activity. Eating a healthy diet. Avoiding tobacco and drug use. Limiting alcohol use. Practicing safe sex. Taking low doses of aspirin every day. Taking vitamin and mineral supplements as recommended by your health care provider. What happens during an annual well check? The services and screenings done by your health care provider during your annual well check will depend on your age, overall health, lifestyle risk factors, and family history of disease. Counseling  Your health care provider may ask you questions about your: Alcohol use. Tobacco use. Drug use. Emotional well-being. Home and relationship well-being. Sexual activity. Eating habits. History of falls. Memory and ability to understand (cognition). Work and  work Statistician. Screening  You may have the following tests or measurements: Height, weight, and BMI. Blood pressure. Lipid and cholesterol levels. These may be checked every 5 years, or more frequently if you are over 58 years old. Skin check. Lung cancer screening. You may have this screening every year starting at age 45 if you have a 30-pack-year history of smoking and currently smoke or have quit within the past 15 years. Fecal occult blood test (FOBT) of the stool. You may have this test every year starting at age 68. Flexible sigmoidoscopy or colonoscopy. You may have a sigmoidoscopy every 5 years or a colonoscopy every 10 years starting at age 26. Prostate cancer screening. Recommendations will vary depending on your family history and other risks. Hepatitis C blood test. Hepatitis B blood test. Sexually transmitted disease (STD) testing. Diabetes screening. This is done by checking your blood sugar (glucose) after you have not eaten for a while (fasting). You may have this done every 1-3 years. Abdominal aortic aneurysm (AAA) screening. You may need this if you are a current or former smoker. Osteoporosis. You may be screened starting at age 82 if you are at high risk. Talk with your health care provider about your test results, treatment options, and if necessary, the need for more tests. Vaccines  Your health care provider may recommend certain vaccines, such as: Influenza vaccine. This is recommended every year. Tetanus, diphtheria, and acellular pertussis (Tdap, Td) vaccine. You may need a Td booster every 10 years. Zoster vaccine. You may need this after age 66. Pneumococcal 13-valent conjugate (PCV13) vaccine. One dose is recommended after age 63. Pneumococcal polysaccharide (PPSV23) vaccine. One dose is recommended after age 60. Talk to your health care provider about which screenings and vaccines you need and  how often you need them. This information is not intended to  replace advice given to you by your health care provider. Make sure you discuss any questions you have with your health care provider. Document Released: 07/15/2015 Document Revised: 03/07/2016 Document Reviewed: 04/19/2015 Elsevier Interactive Patient Education  2017 [place] Prevention in the Home Falls can cause injuries. They can happen to people of all ages. There are many things you can do to make your home safe and to help prevent falls. What can I do on the outside of my home? Regularly fix the edges of walkways and driveways and fix any cracks. Remove anything that might make you trip as you walk through a door, such as a raised step or threshold. Trim any bushes or trees on the path to your home. Use bright outdoor lighting. Clear any walking paths of anything that might make someone trip, such as rocks or tools. Regularly check to see if handrails are loose or broken. Make sure that both sides of any steps have handrails. Any raised decks and porches should have guardrails on the edges. Have any leaves, snow, or ice cleared regularly. Use sand or salt on walking paths during winter. Clean up any spills in your garage right away. This includes oil or grease spills. What can I do in the bathroom? Use night lights. Install grab bars by the toilet and in the tub and shower. Do not use towel bars as grab bars. Use non-skid mats or decals in the tub or shower. If you need to sit down in the shower, use a plastic, non-slip stool. Keep the floor dry. Clean up any water that spills on the floor as soon as it happens. Remove soap buildup in the tub or shower regularly. Attach bath mats securely with double-sided non-slip rug tape. Do not have throw rugs and other things on the floor that can make you trip. What can I do in the bedroom? Use night lights. Make sure that you have a light by your bed that is easy to reach. Do not use any sheets or blankets that are too big for  your bed. They should not hang down onto the floor. Have a firm chair that has side arms. You can use this for support while you get dressed. Do not have throw rugs and other things on the floor that can make you trip. What can I do in the kitchen? Clean up any spills right away. Avoid walking on wet floors. Keep items that you use a lot in easy-to-reach places. If you need to reach something above you, use a strong step stool that has a grab bar. Keep electrical cords out of the way. Do not use floor polish or wax that makes floors slippery. If you must use wax, use non-skid floor wax. Do not have throw rugs and other things on the floor that can make you trip. What can I do with my stairs? Do not leave any items on the stairs. Make sure that there are handrails on both sides of the stairs and use them. Fix handrails that are broken or loose. Make sure that handrails are as long as the stairways. Check any carpeting to make sure that it is firmly attached to the stairs. Fix any carpet that is loose or worn. Avoid having throw rugs at the top or bottom of the stairs. If you do have throw rugs, attach them to the floor with carpet tape. Make sure that you have  a light switch at the top of the stairs and the bottom of the stairs. If you do not have them, ask someone to add them for you. What else can I do to help prevent falls? Wear shoes that: Do not have high heels. Have rubber bottoms. Are comfortable and fit you well. Are closed at the toe. Do not wear sandals. If you use a stepladder: Make sure that it is fully opened. Do not climb a closed stepladder. Make sure that both sides of the stepladder are locked into place. Ask someone to hold it for you, if possible. Clearly mark and make sure that you can see: Any grab bars or handrails. First and last steps. Where the edge of each step is. Use tools that help you move around (mobility aids) if they are needed. These  include: Canes. Walkers. Scooters. Crutches. Turn on the lights when you go into a dark area. Replace any light bulbs as soon as they burn out. Set up your furniture so you have a clear path. Avoid moving your furniture around. If any of your floors are uneven, fix them. If there are any pets around you, be aware of where they are. Review your medicines with your doctor. Some medicines can make you feel dizzy. This can increase your chance of falling. Ask your doctor what other things that you can do to help prevent falls. This information is not intended to replace advice given to you by your health care provider. Make sure you discuss any questions you have with your health care provider. Document Released: 04/14/2009 Document Revised: 11/24/2015 Document Reviewed: 07/23/2014 Elsevier Interactive Patient Education  2017 [employer].

## 2022-02-15 NOTE — Progress Notes (Signed)
Subjective:   Daniel [name] is a 71 y.o. male who presents for an Subsequent  Medicare Annual Wellness Visit.   I connected with Daniel [name]  today by telephone and verified that I am speaking with the correct person using two identifiers. Location patient: home Location provider: work Persons participating in the virtual visit: patient, provider.   I discussed the limitations, risks, security and privacy concerns of performing an evaluation and management service by telephone and the availability of in person appointments. I also discussed with the patient that there may be a patient responsible charge related to this service. The patient expressed understanding and verbally consented to this telephonic visit.    Interactive audio and video telecommunications were attempted between this provider and patient, however failed, due to patient having technical difficulties OR patient did not have access to video capability.  We continued and completed visit with audio only.    Review of Systems     Cardiac Risk Factors include: advanced age (>51men, >48 women);male gender;diabetes mellitus     Objective:    Today's Vitals   There is no height or weight on file to calculate BMI.     02/15/2022    1:34 PM 12/26/2020   10:12 AM 02/26/2020    3:39 PM 02/18/2020    8:05 AM 01/25/2020   10:24 AM 03/04/2019    9:04 AM 10/03/2016    7:46 AM  Advanced Directives  Does Patient Have a Medical Advance Directive? Yes Yes Yes Yes Yes Yes Yes  Type of Advance Directive Living will;Healthcare Power of Attorney Living will Healthcare Power of [place];Living will Healthcare Power of [place];Living will  Living will;Healthcare Power of Attorney Living will  Does patient want to make changes to medical advance directive?   No - Patient declined  No - Patient declined No - Patient declined   Copy of Healthcare Power of Attorney in Chart? No - copy requested     No - copy requested     Current Medications  (verified) Outpatient Encounter Medications as of 02/15/2022  Medication Sig   albuterol (VENTOLIN HFA) 108 (90 Base) MCG/ACT inhaler INHALE 2 PUFFS BY MOUTH EVERY 6 HOURS AS NEEDED FOR WHEEZING OR SHORTNESS OF BREATH   aspirin EC 325 MG EC tablet Take 1 tablet (325 mg total) by mouth 2 (two) times daily after a meal. (Patient taking differently: Take 81 mg by mouth 2 (two) times daily after a meal.)   blood glucose meter kit and supplies 1 each by Other route as directed. Dispense based on patient and insurance preference. Check once per day (variable times).  Accu check.   cetirizine (ZYRTEC) 10 MG tablet Take 10 mg by mouth daily as needed for allergies.    Cholecalciferol (VITAMIN D) 125 MCG (5000 UT) CAPS Take 5,000 Units by mouth daily.   Cyanocobalamin (VITAMIN B 12 PO) Take 1,000 mg by mouth daily.    cyclobenzaprine (FLEXERIL) 10 MG tablet Take 1 tablet (10 mg total) by mouth 3 (three) times daily as needed for muscle spasms.   diclofenac Sodium (VOLTAREN) 1 % GEL Apply 2 g topically daily.   ferrous sulfate 325 (65 FE) MG EC tablet Take 1 tablet (325 mg total) by mouth daily with breakfast.   fluticasone (FLONASE) 50 MCG/ACT nasal spray Place 1 spray into both nostrils daily.   gabapentin (NEURONTIN) 300 MG capsule TAKE 1 TO 2 CAPSULES BY MOUTH TWICE DAILY AS NEEDED   glimepiride (AMARYL) 4 MG tablet Take  1 tablet (4 mg total) by mouth 2 (two) times daily.   hydrochlorothiazide (MICROZIDE) 12.5 MG capsule Take 1 capsule (12.5 mg total) by mouth daily.   Lancets (ONETOUCH DELICA PLUS PPIRJJ88C) MISC USE 1 TO CHECK GLUCOSE ONCE DAILY   liraglutide (VICTOZA) 18 MG/3ML SOPN Inject 1.8 mg into the skin daily. Start 0.$RemoveBefore'6mg'iAUzaEtXIaJhn$  qd for 1 week, then 1.$RemoveBefo'2mg'iKskaBrhMce$  QD for 1 week, then 1.$RemoveBefo'8mg'WczEypeMbgf$  QD.   lisinopril (ZESTRIL) 20 MG tablet Take 1 tablet (20 mg total) by mouth daily.   metFORMIN (GLUCOPHAGE) 1000 MG tablet Take 1 tablet (1,000 mg total) by mouth 2 (two) times daily with a meal.   montelukast (SINGULAIR)  10 MG tablet Take 1 tablet (10 mg total) by mouth at bedtime.   Multiple Vitamin (MULTIVITAMIN WITH MINERALS) TABS tablet Take 1 tablet by mouth daily.   omeprazole (PRILOSEC) 20 MG capsule Take 1 capsule (20 mg total) by mouth daily.   ondansetron (ZOFRAN ODT) 4 MG disintegrating tablet Take 1 tablet (4 mg total) by mouth every 8 (eight) hours as needed for nausea or vomiting.   ONETOUCH ULTRA test strip USE 1 STRIP TO CHECK GLUCOSE ONCE DAILY   pravastatin (PRAVACHOL) 80 MG tablet Take 1 tablet (80 mg total) by mouth daily.   sertraline (ZOLOFT) 100 MG tablet 1 tablet daily.   sertraline (ZOLOFT) 50 MG tablet Take 1 tablet (50 mg total) by mouth daily. Total dose $RemoveBe'150mg'HzlFtqXBY$ .   sodium chloride (OCEAN) 0.65 % SOLN nasal spray Place 1 spray into both nostrils as needed for congestion.   traMADol (ULTRAM) 50 MG tablet TAKE 1 TABLET BY MOUTH EVERY 6 HOURS AS NEEDED   vitamin E 180 MG (400 UNITS) capsule Take 400 Units by mouth daily.   No facility-administered encounter medications on file as of 02/15/2022.    Allergies (verified) Actos [pioglitazone] and Codeine   History: Past Medical History:  Diagnosis Date   Allergic rhinitis    Allergy    Anxiety    Arthritis    Benign hypertension    Cataract    Depression    Diabetes mellitus ([place])    type 2    GERD (gastroesophageal reflux disease)    History of pneumonia    HLD (hyperlipidemia)    Neuromuscular disorder ([place])    pt denies at 8/19/visit    Pneumonia    hx of several times per pt - last episode 4-5 years ago    Past Surgical History:  Procedure Laterality Date   CARPAL TUNNEL RELEASE Bilateral 2020 Right, 2021 left   CATARACT EXTRACTION     bilateral   EYE SURGERY     KNEE SURGERY     right   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   Procedure: LEFT TOTAL KNEE ARTHROPLASTY;  Surgeon: Mcarthur Rossetti, MD;  Location: [facility];  Service: Orthopedics;  Laterality: Left;   Family History  Problem Relation Age of Onset    Diabetes Father    Colon polyps Father    Atrial fibrillation Mother    Prostate cancer Paternal Uncle    Cancer Sister    Esophageal cancer Neg Hx    Rectal cancer Neg Hx    Stomach cancer Neg Hx    Social History   Socioeconomic History   Marital status: Married    Spouse name: Not on file   Number of children: 3   Years of education: 16   Highest education level: Not on file  Occupational History   Occupation: Dealer, Company secretary  Tobacco Use   Smoking status: Never   Smokeless tobacco: Never  Vaping Use   Vaping Use: Never used  Substance and Sexual Activity   Alcohol use: No   Drug use: No   Sexual activity: Not Currently  Other Topics Concern   Not on file  Social History Narrative   Not on file   Social Determinants of Health   Financial Resource Strain: Medium Risk (02/15/2022)   Overall Financial Resource Strain (CARDIA)    Difficulty of Paying Living Expenses: Somewhat hard  Food Insecurity: No Food Insecurity (02/15/2022)   Hunger Vital Sign    Worried About Running Out of Food in the Last Year: Never true    Ran Out of Food in the Last Year: Never true  Transportation Needs: No Transportation Needs (02/15/2022)   PRAPARE - Hydrologist (Medical): No    Lack of Transportation (Non-Medical): No  Physical Activity: Insufficiently Active (02/15/2022)   Exercise Vital Sign    Days of Exercise per Week: 3 days    Minutes of Exercise per Session: 20 min  Stress: No Stress Concern Present (02/15/2022)   [place]    Feeling of Stress : Not at all  Social Connections: Moderately Integrated (02/15/2022)   Social Connection and Isolation Panel [NHANES]    Frequency of Communication with Friends and Family: Three times a week    Frequency of Social Gatherings with Friends and Family: Three times a week    Attends Religious Services: More than 4 times per year     Active Member of Clubs or Organizations: No    Attends Archivist Meetings: Never    Marital Status: Married    Tobacco Counseling Counseling given: Not Answered   Clinical Intake:  Pre-visit preparation completed: Yes  Pain : No/denies pain     Nutritional Risks: None Diabetes: Yes CBG done?: No Did pt. bring in CBG monitor from home?: No  How often do you need to have someone help you when you read instructions, pamphlets, or other written materials from your doctor or pharmacy?: 1 - Never What is the last grade level you completed in school?: college  Diabetic?yes  Nutrition Risk Assessment:  Has the patient had any N/V/D within the last 2 months?  No  Does the patient have any non-healing wounds?  No  Has the patient had any unintentional weight loss or weight gain?  No   Diabetes:  Is the patient diabetic?  Yes  If diabetic, was a CBG obtained today?  No  Did the patient bring in their glucometer from home?  No  How often do you monitor your CBG's? 1-2 times a day .   Financial Strains and Diabetes Management:  Are you having any financial strains with the device, your supplies or your medication? No .  Does the patient want to be seen by Chronic Care Management for management of their diabetes?  No  Would the patient like to be referred to a Nutritionist or for Diabetic Management?  No   Diabetic Exams:  Diabetic Eye Exam: Completed 08/2020 Diabetic Foot Exam: Overdue, Pt has been advised about the importance in completing this exam. Pt is scheduled for diabetic foot exam on next office visit .   Interpreter Needed?: No  Information entered by :: L.Wilson,LPN   Activities of Daily Living    02/15/2022    1:39 PM  In your present state of  health, do you have any difficulty performing the following activities:  Hearing? 0  Vision? 0  Difficulty concentrating or making decisions? 0  Walking or climbing stairs? 0  Dressing or bathing? 0   Doing errands, shopping? 0  Preparing Food and eating ? N  Using the Toilet? N  In the past six months, have you accidently leaked urine? N  Do you have problems with loss of bowel control? N  Managing your Medications? N  Managing your Finances? N  Housekeeping or managing your Housekeeping? N    Patient Care Team: Wendie Agreste, MD as PCP - General (Family Medicine)  Indicate any recent Medical Services you may have received from other than [facility] providers in the past year (date may be approximate).     Assessment:   This is a routine wellness examination for Rodriques.  Hearing/Vision screen Vision Screening - Comments:: Annual eye exams wear glasses   Dietary issues and exercise activities discussed: Current Exercise Habits: Home exercise routine, Type of exercise: walking, Time (Minutes): 20, Frequency (Times/Week): 3, Weekly Exercise (Minutes/Week): 60, Intensity: Mild, Exercise limited by: None identified   Goals Addressed   None    Depression Screen    02/15/2022    1:36 PM 02/15/2022    1:33 PM 01/12/2022    8:26 AM 11/30/2021    9:03 AM 08/30/2021    8:59 AM 12/26/2020   10:11 AM 11/10/2020    2:00 PM  PHQ 2/9 Scores  PHQ - 2 Score 0 0 0 1 0 0 0  PHQ- 9 Score   3 7       Fall Risk    02/15/2022    1:35 PM 01/12/2022    8:26 AM 11/30/2021    9:02 AM 08/30/2021    8:59 AM 05/30/2021    1:50 PM  Fall Risk   Falls in the past year? 1 1 0 0 1  Number falls in past yr: 0 0 0 0 0  Injury with Fall? 0 0 0 0 0  Risk for fall due to :  History of fall(s) No Fall Risks No Fall Risks History of fall(s)  Follow up Falls evaluation completed;Education provided Falls evaluation completed Falls evaluation completed Falls evaluation completed Falls evaluation completed    [place]:  Any stairs in or around the home? Yes  If so, are there any without handrails? No  Home free of loose throw rugs in walkways, pet beds, electrical cords, etc?  Yes  Adequate lighting in your home to reduce risk of falls? Yes   ASSISTIVE DEVICES UTILIZED TO PREVENT FALLS:  Life alert? No  Use of a cane, walker or w/c? No  Grab bars in the bathroom? Yes  Shower chair or bench in shower? Yes  Elevated toilet seat or a handicapped toilet? Yes    Cognitive Function:    Normal cognitive status assessed by teelphone conversation  by this Nurse Health Advisor. No abnormalities found.      02/15/2022    1:40 PM 01/25/2020   10:19 AM 03/04/2019    9:04 AM  6CIT Screen  What Year? 0 points 0 points 0 points  What month? 0 points  0 points  What time? 0 points 0 points 0 points  Count back from 20 0 points 0 points 0 points  Months in reverse 0 points 0 points 0 points  Repeat phrase 0 points 0 points 0 points  Total Score 0 points  0 points    Immunizations Immunization History  Administered Date(s) Administered   Fluad Quad(high Dose 65+) 02/11/2019, 04/06/2020   Influenza, High Dose Seasonal PF 07/14/2018, 07/14/2018   Influenza,inj,Quad PF,6+ Mos 05/03/2015, 02/28/2016, 04/08/2017   Influenza-Unspecified 04/01/2021   PFIZER(Purple Top)SARS-COV-2 Vaccination 08/31/2019, 09/21/2019, 05/07/2021   Pneumococcal Conjugate-13 09/13/2016   Pneumococcal Polysaccharide-23 07/19/2010, 02/11/2019   Tdap 03/14/2006, 12/31/2017    TDAP status: Up to date  Flu Vaccine status: Up to date  Pneumococcal vaccine status: Up to date  Covid-19 vaccine status: Completed vaccines  Qualifies for Shingles Vaccine? Yes   Zostavax completed No   Shingrix Completed?: No.    Education has been provided regarding the importance of this vaccine. Patient has been advised to call insurance company to determine out of pocket expense if they have not yet received this vaccine. Advised may also receive vaccine at local pharmacy or Health Dept. Verbalized acceptance and understanding.  Screening Tests Health Maintenance  Topic Date Due   OPHTHALMOLOGY EXAM   05/09/2021   COVID-19 Vaccine (4 - Pfizer risk series) 07/02/2021   INFLUENZA VACCINE  01/30/2022   Zoster Vaccines- Shingrix (1 of 2) 04/14/2022 (Originally 12/21/1969)   HEMOGLOBIN A1C  06/01/2022   FOOT EXAM  01/13/2023   TETANUS/TDAP  01/01/2028   COLONOSCOPY (Pts 45-32yrs Insurance coverage will need to be confirmed)  11/21/2028   Pneumonia Vaccine 35+ Years old  Completed   Hepatitis C Screening  Completed   HPV VACCINES  Aged Out    Health Maintenance  Health Maintenance Due  Topic Date Due   OPHTHALMOLOGY EXAM  05/09/2021   COVID-19 Vaccine (4 - Pfizer risk series) 07/02/2021   INFLUENZA VACCINE  01/30/2022    Colorectal cancer screening: Type of screening: Colonoscopy. Completed 11/21/2021. Repeat every 7 years  Lung Cancer Screening: (Low Dose CT Chest recommended if Age 39-80 years, 30 pack-year currently smoking OR have quit w/in 15years.) does not qualify.   Lung Cancer Screening Referral: n/a  Additional Screening:  Hepatitis C Screening: does not qualify;   Vision Screening: Recommended annual ophthalmology exams for early detection of glaucoma and other disorders of the eye. Is the patient up to date with their annual eye exam?  Yes  Who is the provider or what is the name of the office in which the patient attends annual eye exams? [facility]  If pt is not established with a provider, would they like to be referred to a provider to establish care? No .   Dental Screening: Recommended annual dental exams for proper oral hygiene  Community Resource Referral / Chronic Care Management: CRR required this visit?  No   CCM required this visit?  No      Plan:     I have personally reviewed and noted the following in the patient's chart:   Medical and social history Use of alcohol, tobacco or illicit drugs  Current medications and supplements including opioid prescriptions. Patient is not currently taking opioid prescriptions. Functional ability and  status Nutritional status Physical activity Advanced directives List of other physicians Hospitalizations, surgeries, and ER visits in previous 12 months Vitals Screenings to include cognitive, depression, and falls Referrals and appointments  In addition, I have reviewed and discussed with patient certain preventive protocols, quality metrics, and best practice recommendations. A written personalized care plan for preventive services as well as general preventive health recommendations were provided to patient.     Daphane Shepherd, LPN   0/25/4270   Nurse Notes:  none

## 2022-02-21 ENCOUNTER — Other Ambulatory Visit: Payer: Self-pay | Admitting: Family Medicine

## 2022-02-21 DIAGNOSIS — G8929 Other chronic pain: Secondary | ICD-10-CM

## 2022-02-21 DIAGNOSIS — E1142 Type 2 diabetes mellitus with diabetic polyneuropathy: Secondary | ICD-10-CM

## 2022-02-21 NOTE — Telephone Encounter (Signed)
Controlled substance database reviewed.  Last filled 01/18/2022 for tramadol.  Gabapentin discussed last visit, and knee pain in March.  Refills ordered

## 2022-02-21 NOTE — Telephone Encounter (Signed)
Patient is requesting a refill of the following medications: Requested Prescriptions   Pending Prescriptions Disp Refills   traMADol (ULTRAM) 50 MG tablet [Pharmacy Med Name: traMADol HCl 50 MG Oral Tablet] 120 tablet 0    Sig: TAKE 1 TABLET BY MOUTH EVERY 6 HOURS AS NEEDED   gabapentin (NEURONTIN) 300 MG capsule [Pharmacy Med Name: Gabapentin 300 MG Oral Capsule] 180 capsule 0    Sig: TAKE 1 TO 2 CAPSULES BY MOUTH TWICE DAILY AS NEEDED    Date of patient request: 02/21/22 Last office visit: 01/12/22 Date of last refill: 01/18/22 Last refill amount: 01/18/22, 08/30/21 Follow up time period per chart: 2 months

## 2022-03-15 ENCOUNTER — Ambulatory Visit (INDEPENDENT_AMBULATORY_CARE_PROVIDER_SITE_OTHER): Payer: Medicare Other | Admitting: Family Medicine

## 2022-03-15 ENCOUNTER — Encounter: Payer: Self-pay | Admitting: Family Medicine

## 2022-03-15 VITALS — BP 136/78 | HR 64 | Temp 98.5°F | Ht 68.0 in | Wt 232.2 lb

## 2022-03-15 DIAGNOSIS — E1142 Type 2 diabetes mellitus with diabetic polyneuropathy: Secondary | ICD-10-CM | POA: Diagnosis not present

## 2022-03-15 DIAGNOSIS — G8929 Other chronic pain: Secondary | ICD-10-CM

## 2022-03-15 DIAGNOSIS — K219 Gastro-esophageal reflux disease without esophagitis: Secondary | ICD-10-CM

## 2022-03-15 DIAGNOSIS — E7800 Pure hypercholesterolemia, unspecified: Secondary | ICD-10-CM

## 2022-03-15 DIAGNOSIS — J4520 Mild intermittent asthma, uncomplicated: Secondary | ICD-10-CM

## 2022-03-15 DIAGNOSIS — I10 Essential (primary) hypertension: Secondary | ICD-10-CM

## 2022-03-15 DIAGNOSIS — M25561 Pain in right knee: Secondary | ICD-10-CM

## 2022-03-15 DIAGNOSIS — M25562 Pain in left knee: Secondary | ICD-10-CM

## 2022-03-15 DIAGNOSIS — F32A Depression, unspecified: Secondary | ICD-10-CM

## 2022-03-15 LAB — HEMOGLOBIN A1C: Hgb A1c MFr Bld: 8.3 % — ABNORMAL HIGH (ref 4.6–6.5)

## 2022-03-15 LAB — COMPREHENSIVE METABOLIC PANEL
ALT: 15 U/L (ref 0–53)
AST: 14 U/L (ref 0–37)
Albumin: 3.9 g/dL (ref 3.5–5.2)
Alkaline Phosphatase: 78 U/L (ref 39–117)
BUN: 22 mg/dL (ref 6–23)
CO2: 29 mEq/L (ref 19–32)
Calcium: 9.2 mg/dL (ref 8.4–10.5)
Chloride: 101 mEq/L (ref 96–112)
Creatinine, Ser: 1.29 mg/dL (ref 0.40–1.50)
GFR: 55.11 mL/min — ABNORMAL LOW (ref >60.00)
Glucose, Bld: 148 mg/dL — ABNORMAL HIGH (ref 70–99)
Potassium: 4.8 mEq/L (ref 3.5–5.1)
Sodium: 138 mEq/L (ref 135–145)
Total Bilirubin: 0.3 mg/dL (ref 0.2–1.2)
Total Protein: 6.9 g/dL (ref 6.0–8.3)

## 2022-03-15 LAB — LIPID PANEL
Cholesterol: 149 mg/dL (ref 0–200)
HDL: 55 mg/dL (ref >39.00)
LDL Cholesterol: 72 mg/dL (ref 0–99)
NonHDL: 94.3
Total CHOL/HDL Ratio: 3
Triglycerides: 114 mg/dL (ref 0.0–149.0)
VLDL: 22.8 mg/dL (ref 0.0–40.0)

## 2022-03-15 MED ORDER — HYDROCHLOROTHIAZIDE 12.5 MG PO CAPS: 12.5000 mg | ORAL_CAPSULE | Freq: Every day | ORAL | 1 refills | Status: DC

## 2022-03-15 MED ORDER — GLIMEPIRIDE 4 MG PO TABS
4.0000 mg | ORAL_TABLET | Freq: Two times a day (BID) | ORAL | 1 refills | Status: DC
Start: 2022-03-15 — End: 2022-06-01

## 2022-03-15 MED ORDER — MONTELUKAST SODIUM 10 MG PO TABS: 10.0000 mg | ORAL_TABLET | Freq: Every day | ORAL | 2 refills | Status: DC

## 2022-03-15 MED ORDER — OMEPRAZOLE 20 MG PO CPDR: 20.0000 mg | DELAYED_RELEASE_CAPSULE | Freq: Every day | ORAL | 1 refills | Status: DC

## 2022-03-15 MED ORDER — LISINOPRIL 20 MG PO TABS: 20.0000 mg | ORAL_TABLET | Freq: Every day | ORAL | 3 refills | Status: DC

## 2022-03-15 MED ORDER — ALBUTEROL SULFATE HFA 108 (90 BASE) MCG/ACT IN AERS: INHALATION_SPRAY | RESPIRATORY_TRACT | 0 refills | Status: DC

## 2022-03-15 MED ORDER — SERTRALINE HCL 50 MG PO TABS: 50.0000 mg | ORAL_TABLET | Freq: Every day | ORAL | 3 refills | Status: DC

## 2022-03-15 MED ORDER — SERTRALINE HCL 100 MG PO TABS: ORAL_TABLET | ORAL | 1 refills | Status: DC

## 2022-03-15 MED ORDER — METFORMIN HCL 1000 MG PO TABS: 1000.0000 mg | ORAL_TABLET | Freq: Two times a day (BID) | ORAL | 1 refills | Status: DC

## 2022-03-15 NOTE — Patient Instructions (Addendum)
Depending on readings we can discuss alternatives to victoza. No change in other meds for now.

## 2022-03-15 NOTE — Progress Notes (Unsigned)
Subjective:  Patient ID: Daniel [name], male    DOB: August 05, 1950  Age: 71 y.o. MRN: [id number]  CC:  Chief Complaint  Patient presents with   Diabetes    Pt wants other options for meds due to cost, pt is fasting   Depression    Pt states all is well   Insomnia    Pt states all is well    HPI Daniel [name] presents for  Diabetes: Complicated by hyperglycemia, polyneuropathy.  Has been treated previously with Victoza, metformin, glimepiride and statin/ACE inhibitor.  Gabapentin for neuropathy symptoms.  Victoza has been cost prohibitive.  When we last discussed in July he had just refilled it. Almost out of it. Taking daily  - 5 missed doses in past 2 months. Costs $345 for 30 day supply.  Microalbumin: Normal ratio 11/30/2021 Optho, foot exam, pneumovax: Plan to schedule Optho appointment when discussed at his last visit.  Home readings fasting: 80-120 Home readings postprandial: 140-160. Rare 200 - once in few months.  Symptomatic lows: none.   Lab Results  Component Value Date   HGBA1C 8.4 (A) 11/30/2021   HGBA1C 9.4 (H) 08/30/2021   HGBA1C 9.1 Repeated and verified X2. (H) 11/10/2020   Lab Results  Component Value Date   MICROALBUR 1.4 11/30/2021   LDLCALC 148 (H) 08/30/2021   CREATININE 1.34 08/30/2021   On omeprazole for GERD, working well.  Depression/insomnia  discussed at his July visit.  Fluid restriction at bedtime recommended to lessen small contribution to insomnia.  Continued on same dose of Zoloft 150 mg daily.still doing well. Sleep is better with fluid restriction at bedtime.   Allergies/asthma flonase, Zyrtec with albuterol as needed but not needed recently. Min allergy sx's this time of yr.   Hypertension, hyperlipidemia Takes HCTZ, lisinopril, pravastatin, no new side effects or myalgias.consistent use of meds.  Fasting today.  Lab Results  Component Value Date   CHOL 233 (H) 08/30/2021   HDL 54.70 08/30/2021   LDLCALC 148 (H) 08/30/2021   TRIG  152.0 (H) 08/30/2021   CHOLHDL 4 08/30/2021   BP Readings from Last 3 Encounters:  03/15/22 136/78  01/12/22 138/78  11/30/21 130/84    Chronic knee pain History of left total knee replacement, persistent right knee pain, last discussed in March.  Considering surgery but delayed due to health needs of spouse.  Treated with tramadol, 1:59 in the morning, 1 at night, typically 2 to 3 pills/day has worked well.  Sometimes able to just take 1/day.  Consistent refills, controlled substance database reviewed, tramadol No. 1 20 on 02/22/2022.  Previously 01/18/2022, previously 11/30/2021.  Taking tramadol 2-3 pills per day. Still considering surgery, but not in near future as knee doing ok for now.    History 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   Trigger finger, left little finger 02/04/2020   Carpal tunnel syndrome, left upper limb 11/24/2019   Unilateral primary osteoarthritis, left knee 11/02/2019   Unilateral primary osteoarthritis, right knee 11/02/2019   Carpal tunnel syndrome, right upper limb 02/18/2018   Anxiety and depression 10/28/2016   Gastroesophageal reflux disease without esophagitis 10/28/2016   Seasonal allergic rhinitis due to pollen 10/28/2016   Primary osteoarthritis of left knee 07/25/2015   Essential hypertension 07/25/2015   Hypercholesteremia 07/25/2015   Type 2 diabetes mellitus with diabetic polyneuropathy, without long-term current use of insulin ([place]) 07/25/2015   Pollen allergies 02/10/2014   Allergic  rhinitis due to animal (cat) (dog) hair and dander 09/15/2012   Extrinsic asthma 09/13/2011   Proteinuria 11/24/2010   Past Medical History:  Diagnosis Date   Allergic rhinitis    Allergy    Anxiety    Arthritis    Benign hypertension    Cataract    Depression    Diabetes mellitus ([place])    type 2    GERD (gastroesophageal reflux disease)    History of pneumonia    HLD (hyperlipidemia)     Neuromuscular disorder ([place])    pt denies at 8/19/visit    Pneumonia    hx of several times per pt - last episode 4-5 years ago    Past Surgical History:  Procedure Laterality Date   CARPAL TUNNEL RELEASE Bilateral 2020 Right, 2021 left   CATARACT EXTRACTION     bilateral   EYE SURGERY     KNEE SURGERY     right   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   Procedure: LEFT TOTAL KNEE ARTHROPLASTY;  Surgeon: Mcarthur Rossetti, MD;  Location: [facility];  Service: Orthopedics;  Laterality: Left;   Allergies  Allergen Reactions   Actos [Pioglitazone] Other (See Comments), Shortness Of Breath and Swelling    Blood sugar went up and down and sweating profusely   Codeine Shortness Of Breath and Palpitations   Prior to Admission medications   Medication Sig Start Date End Date Taking? Authorizing Provider  albuterol (VENTOLIN HFA) 108 (90 Base) MCG/ACT inhaler INHALE 2 PUFFS BY MOUTH EVERY 6 HOURS AS NEEDED FOR WHEEZING OR SHORTNESS OF BREATH 06/08/21  Yes Maximiano Coss, NP  aspirin EC 325 MG EC tablet Take 1 tablet (325 mg total) by mouth 2 (two) times daily after a meal. Patient taking differently: Take 81 mg by mouth 2 (two) times daily after a meal. 02/27/20  Yes Mcarthur Rossetti, MD  blood glucose meter kit and supplies 1 each by Other route as directed. Dispense based on patient and insurance preference. Check once per day (variable times).  Accu check. 07/29/18  Yes Wendie Agreste, MD  cetirizine (ZYRTEC) 10 MG tablet Take 10 mg by mouth daily as needed for allergies.    Yes [provider]  Cholecalciferol (VITAMIN D) 125 MCG (5000 UT) CAPS Take 5,000 Units by mouth daily.   Yes [provider]  Cyanocobalamin (VITAMIN B 12 PO) Take 1,000 mg by mouth daily.    Yes [provider]  diclofenac Sodium (VOLTAREN) 1 % GEL Apply 2 g topically daily.   Yes [provider]  ferrous sulfate 325 (65 FE) MG EC tablet Take 1 tablet (325 mg total)  by mouth daily with breakfast. 10/05/21  Yes Armbruster, Carlota Raspberry, MD  fluticasone (FLONASE) 50 MCG/ACT nasal spray Place 1 spray into both nostrils daily.   Yes [provider]  gabapentin (NEURONTIN) 300 MG capsule TAKE 1 TO 2 CAPSULES BY MOUTH TWICE DAILY AS NEEDED 02/21/22  Yes Wendie Agreste, MD  glimepiride (AMARYL) 4 MG tablet Take 1 tablet (4 mg total) by mouth 2 (two) times daily. 08/30/21  Yes Wendie Agreste, MD  hydrochlorothiazide (MICROZIDE) 12.5 MG capsule Take 1 capsule (12.5 mg total) by mouth daily. 08/30/21  Yes Wendie Agreste, MD  Lancets [facility] DELICA PLUS [id number]) MISC USE 1 TO CHECK GLUCOSE ONCE DAILY 08/04/18  Yes [provider]  liraglutide (VICTOZA) 18 MG/3ML SOPN Inject 1.8 mg into the skin daily. Start 0.64m qd for 1 week, then 1.28m  QD for 1 week, then 1.71m QD. 08/30/21  Yes GWendie Agreste MD  lisinopril (ZESTRIL) 20 MG tablet Take 1 tablet (20 mg total) by mouth daily. 08/30/21  Yes GWendie Agreste MD  metFORMIN (GLUCOPHAGE) 1000 MG tablet Take 1 tablet (1,000 mg total) by mouth 2 (two) times daily with a meal. 08/30/21  Yes GWendie Agreste MD  montelukast (SINGULAIR) 10 MG tablet Take 1 tablet (10 mg total) by mouth at bedtime. 08/30/21  Yes GWendie Agreste MD  Multiple Vitamin (MULTIVITAMIN WITH MINERALS) TABS tablet Take 1 tablet by mouth daily.   Yes [provider]  omeprazole (PRILOSEC) 20 MG capsule Take 1 capsule (20 mg total) by mouth daily. 08/30/21  Yes GWendie Agreste MD  [facility] test strip USE 1 STRIP TO CHECK GLUCOSE ONCE DAILY 11/25/18  Yes GWendie Agreste MD  pravastatin (PRAVACHOL) 80 MG tablet Take 1 tablet (80 mg total) by mouth daily. 08/30/21  Yes GWendie Agreste MD  sertraline (ZOLOFT) 100 MG tablet 1 tablet daily. 08/30/21  Yes GWendie Agreste MD  sertraline (ZOLOFT) 50 MG tablet Take 1 tablet (50 mg total) by mouth daily. Total dose 1536m 11/30/21  Yes GrWendie AgresteMD  traMADol (ULTRAM) 50 MG  tablet TAKE 1 TABLET BY MOUTH EVERY 6 HOURS AS NEEDED 02/21/22  Yes GrWendie AgresteMD  vitamin E 180 MG (400 UNITS) capsule Take 400 Units by mouth daily.   Yes [provider]  cyclobenzaprine (FLEXERIL) 10 MG tablet Take 1 tablet (10 mg total) by mouth 3 (three) times daily as needed for muscle spasms. Patient not taking: Reported on 03/15/2022 10/26/20   MoMaximiano CossNP  ondansetron (ZOFRAN ODT) 4 MG disintegrating tablet Take 1 tablet (4 mg total) by mouth every 8 (eight) hours as needed for nausea or vomiting. Patient not taking: Reported on 03/15/2022 02/27/20   BlMcarthur RossettiMD  sodium chloride (OCEAN) 0.65 % SOLN nasal spray Place 1 spray into both nostrils as needed for congestion. Patient not taking: Reported on 03/15/2022    [provider]   Social History   Socioeconomic History   Marital status: Married    Spouse name: Not on file   Number of children: 3   Years of education: 16   Highest education level: Not on file  Occupational History   Occupation: tiDealermiCompany secretaryTobacco Use   Smoking status: Never   Smokeless tobacco: Never  Vaping Use   Vaping Use: Never used  Substance and Sexual Activity   Alcohol use: No   Drug use: No   Sexual activity: Not Currently  Other Topics Concern   Not on file  Social History Narrative   Not on file   Social Determinants of Health   Financial Resource Strain: Medium Risk (02/15/2022)   Overall Financial Resource Strain (CARDIA)    Difficulty of Paying Living Expenses: Somewhat hard  Food Insecurity: No Food Insecurity (02/15/2022)   Hunger Vital Sign    Worried About Running Out of Food in the Last Year: Never true    Ran Out of Food in the Last Year: Never true  Transportation Needs: No Transportation Needs (02/15/2022)   PRAPARE - TrHydrologistMedical): No    Lack of Transportation (Non-Medical): No  Physical Activity: Insufficiently Active (02/15/2022)    Exercise Vital Sign    Days of Exercise per Week: 3 days    Minutes of Exercise per  Session: 20 min  Stress: No Stress Concern Present (02/15/2022)   [place]    Feeling of Stress : Not at all  Social Connections: Moderately Integrated (02/15/2022)   Social Connection and Isolation Panel [NHANES]    Frequency of Communication with Friends and Family: Three times a week    Frequency of Social Gatherings with Friends and Family: Three times a week    Attends Religious Services: More than 4 times per year    Active Member of Clubs or Organizations: No    Attends Archivist Meetings: Never    Marital Status: Married  Human resources officer Violence: Not At Risk (02/15/2022)   Humiliation, Afraid, Rape, and Kick questionnaire    Fear of Current or Ex-Partner: No    Emotionally Abused: No    Physically Abused: No    Sexually Abused: No    Review of Systems  Constitutional:  Negative for fatigue and unexpected weight change.  Eyes:  Negative for visual disturbance.  Respiratory:  Negative for cough, chest tightness and shortness of breath.   Cardiovascular:  Negative for chest pain, palpitations and leg swelling.  Gastrointestinal:  Negative for abdominal pain and blood in stool.  Neurological:  Negative for dizziness, light-headedness and headaches.     Objective:   Vitals:   03/15/22 0909  BP: 136/78  Pulse: 64  Temp: 98.5 F (36.9 C)  SpO2: 98%  Weight: 232 lb 3.2 oz (105.3 kg)  Height: 5' 8" (1.727 m)     Physical Exam Vitals reviewed.  Constitutional:      Appearance: He is well-developed.  HENT:     Head: Normocephalic and atraumatic.  Neck:     Vascular: No carotid bruit or JVD.  Cardiovascular:     Rate and Rhythm: Normal rate and regular rhythm.     Heart sounds: Normal heart sounds. No murmur heard. Pulmonary:     Effort: Pulmonary effort is normal.     Breath sounds: Normal breath  sounds. No rales.  Musculoskeletal:     Right lower leg: No edema.     Left lower leg: No edema.  Skin:    General: Skin is warm and dry.  Neurological:     Mental Status: He is alert and oriented to person, place, and time.  Psychiatric:        Mood and Affect: Mood normal.        Assessment & Plan:  Daniel [name] is a 71 y.o. male . Mild intermittent asthma without complication  Type 2 diabetes mellitus with diabetic polyneuropathy, without long-term current use of insulin (HCC)  Essential hypertension  Depression, unspecified depression type  Chronic pain of both knees   No orders of the defined types were placed in this encounter.  Patient Instructions  Depending on readings we can discuss alternatives to victoza. No change in other meds for now.     Signed,   Merri Ray, MD [place] 03/15/22 9:46 AM

## 2022-03-17 ENCOUNTER — Encounter: Payer: Self-pay | Admitting: Family Medicine

## 2022-03-20 ENCOUNTER — Encounter: Payer: Self-pay | Admitting: Family Medicine

## 2022-03-21 ENCOUNTER — Other Ambulatory Visit: Payer: Self-pay | Admitting: Family Medicine

## 2022-03-21 DIAGNOSIS — E1142 Type 2 diabetes mellitus with diabetic polyneuropathy: Secondary | ICD-10-CM

## 2022-03-21 MED ORDER — DAPAGLIFLOZIN PROPANEDIOL 10 MG PO TABS: 10.0000 mg | ORAL_TABLET | Freq: Every day | ORAL | 3 refills | Status: DC

## 2022-04-04 ENCOUNTER — Other Ambulatory Visit: Payer: Self-pay | Admitting: Family Medicine

## 2022-04-04 DIAGNOSIS — G8929 Other chronic pain: Secondary | ICD-10-CM

## 2022-04-04 NOTE — Telephone Encounter (Signed)
Tramadol 50 mg LOV: 03/15/22 Last Refill:02/21/22 Upcoming appt: 06/20/22

## 2022-04-05 NOTE — Telephone Encounter (Signed)
Chronic knee pain discussed at his September 14 visit.  Agreed to continue tramadol at that time.  Controlled substance database reviewed, last filled for #120 on 02/22/2022.  Refill ordered.

## 2022-04-06 NOTE — Telephone Encounter (Signed)
Pt informed that refill has been sent

## 2022-04-20 ENCOUNTER — Other Ambulatory Visit: Payer: Self-pay | Admitting: Family Medicine

## 2022-04-20 DIAGNOSIS — E1142 Type 2 diabetes mellitus with diabetic polyneuropathy: Secondary | ICD-10-CM

## 2022-05-07 ENCOUNTER — Ambulatory Visit: Payer: Medicare Other | Admitting: Family Medicine

## 2022-05-16 ENCOUNTER — Encounter: Payer: Self-pay | Admitting: Family Medicine

## 2022-05-16 ENCOUNTER — Ambulatory Visit (INDEPENDENT_AMBULATORY_CARE_PROVIDER_SITE_OTHER): Payer: Medicare Other | Admitting: Family Medicine

## 2022-05-16 VITALS — BP 138/60 | HR 66 | Temp 98.3°F | Ht 68.0 in | Wt 232.8 lb

## 2022-05-16 DIAGNOSIS — R197 Diarrhea, unspecified: Secondary | ICD-10-CM | POA: Diagnosis not present

## 2022-05-16 DIAGNOSIS — R5383 Other fatigue: Secondary | ICD-10-CM | POA: Diagnosis not present

## 2022-05-16 DIAGNOSIS — R400 Somnolence: Secondary | ICD-10-CM

## 2022-05-16 DIAGNOSIS — D649 Anemia, unspecified: Secondary | ICD-10-CM | POA: Diagnosis not present

## 2022-05-16 DIAGNOSIS — E1142 Type 2 diabetes mellitus with diabetic polyneuropathy: Secondary | ICD-10-CM

## 2022-05-16 LAB — CBC WITH DIFFERENTIAL/PLATELET
Basophils Absolute: 0 10*3/uL (ref 0.0–0.1)
Basophils Relative: 0.4 % (ref 0.0–3.0)
Eosinophils Absolute: 0.3 10*3/uL (ref 0.0–0.7)
Eosinophils Relative: 2.6 % (ref 0.0–5.0)
HCT: 33.9 % — ABNORMAL LOW (ref 39.0–52.0)
Hemoglobin: 11.3 g/dL — ABNORMAL LOW (ref 13.0–17.0)
Lymphocytes Relative: 23.7 % (ref 12.0–46.0)
Lymphs Abs: 2.4 10*3/uL (ref 0.7–4.0)
MCHC: 33.3 g/dL (ref 30.0–36.0)
MCV: 90.8 fl (ref 78.0–100.0)
Monocytes Absolute: 1 10*3/uL (ref 0.1–1.0)
Monocytes Relative: 10.1 % (ref 3.0–12.0)
Neutro Abs: 6.3 10*3/uL (ref 1.4–7.7)
Neutrophils Relative %: 63.2 % (ref 43.0–77.0)
Platelets: 347 10*3/uL (ref 150.0–400.0)
RBC: 3.74 Mil/uL — ABNORMAL LOW (ref 4.22–5.81)
RDW: 13.9 % (ref 11.5–15.5)
WBC: 10 10*3/uL (ref 4.0–10.5)

## 2022-05-16 LAB — BASIC METABOLIC PANEL
BUN: 27 mg/dL — ABNORMAL HIGH (ref 6–23)
CO2: 31 mEq/L (ref 19–32)
Calcium: 8.9 mg/dL (ref 8.4–10.5)
Chloride: 99 mEq/L (ref 96–112)
Creatinine, Ser: 1.76 mg/dL — ABNORMAL HIGH (ref 0.40–1.50)
GFR: 37.92 mL/min — ABNORMAL LOW (ref >60.00)
Glucose, Bld: 179 mg/dL — ABNORMAL HIGH (ref 70–99)
Potassium: 5.5 mEq/L — ABNORMAL HIGH (ref 3.5–5.1)
Sodium: 135 mEq/L (ref 135–145)

## 2022-05-16 NOTE — Patient Instructions (Addendum)
Do not take any additional metformin - that may be causing some of the diarrhea.  Call Dr. Havery Moros about diarrhea and plan if continues after stopping additional metformin. Immodium for now short term ok. We may need to decrease metformin dose if diarrhea persists.  See info below on diet with diabetes. I am referring you to nutritionist as well. Recheck 1 month for A1c and diabetes med options.   If any return of slurred speech, or any weakness be seen in ER.  Return to the clinic or go to the nearest emergency room if any of your symptoms worsen or new symptoms occur.    Diabetes Mellitus and Nutrition, Adult When you have diabetes, or diabetes mellitus, it is very important to have healthy eating habits because your blood sugar (glucose) levels are greatly affected by what you eat and drink. Eating healthy foods in the right amounts, at about the same times every day, can help you: Manage your blood glucose. Lower your risk of heart disease. Improve your blood pressure. Reach or maintain a healthy weight. What can affect my meal plan? Every person with diabetes is different, and each person has different needs for a meal plan. Your health care provider may recommend that you work with a dietitian to make a meal plan that is best for you. Your meal plan may vary depending on factors such as: The calories you need. The medicines you take. Your weight. Your blood glucose, blood pressure, and cholesterol levels. Your activity level. Other health conditions you have, such as heart or kidney disease. How do carbohydrates affect me? Carbohydrates, also called carbs, affect your blood glucose level more than any other type of food. Eating carbs raises the amount of glucose in your blood. It is important to know how many carbs you can safely have in each meal. This is different for every person. Your dietitian can help you calculate how many carbs you should have at each meal and for each  snack. How does alcohol affect me? Alcohol can cause a decrease in blood glucose (hypoglycemia), especially if you use insulin or take certain diabetes medicines by mouth. Hypoglycemia can be a life-threatening condition. Symptoms of hypoglycemia, such as sleepiness, dizziness, and confusion, are similar to symptoms of having too much alcohol. Do not drink alcohol if: Your health care provider tells you not to drink. You are pregnant, may be pregnant, or are planning to become pregnant. If you drink alcohol: Limit how much you have to: 0-1 drink a day for women. 0-2 drinks a day for men. Know how much alcohol is in your drink. In the [place] one drink equals one 12 oz bottle of beer (355 mL), one 5 oz glass of wine (148 mL), or one 1 oz glass of hard liquor (44 mL). Keep yourself hydrated with water, diet soda, or unsweetened iced tea. Keep in mind that regular soda, juice, and other mixers may contain a lot of sugar and must be counted as carbs. What are tips for following this plan?  Reading food labels Start by checking the serving size on the Nutrition Facts label of packaged foods and drinks. The number of calories and the amount of carbs, fats, and other nutrients listed on the label are based on one serving of the item. Many items contain more than one serving per package. Check the total grams (g) of carbs in one serving. Check the number of grams of saturated fats and trans fats in one serving. Choose foods that  have a low amount or none of these fats. Check the number of milligrams (mg) of salt (sodium) in one serving. Most people should limit total sodium intake to less than 2,300 mg per day. Always check the nutrition information of foods labeled as "low-fat" or "nonfat." These foods may be higher in added sugar or refined carbs and should be avoided. Talk to your dietitian to identify your daily goals for nutrients listed on the label. Shopping Avoid buying canned, pre-made, or  processed foods. These foods tend to be high in fat, sodium, and added sugar. Shop around the outside edge of the grocery store. This is where you will most often find fresh fruits and vegetables, bulk grains, fresh meats, and fresh dairy products. Cooking Use low-heat cooking methods, such as baking, instead of high-heat cooking methods, such as deep frying. Cook using healthy oils, such as olive, canola, or sunflower oil. Avoid cooking with butter, cream, or high-fat meats. Meal planning Eat meals and snacks regularly, preferably at the same times every day. Avoid going long periods of time without eating. Eat foods that are high in fiber, such as fresh fruits, vegetables, beans, and whole grains. Eat 4-6 oz (112-168 g) of lean protein each day, such as lean meat, chicken, fish, eggs, or tofu. One ounce (oz) (28 g) of lean protein is equal to: 1 oz (28 g) of meat, chicken, or fish. 1 egg.  cup (62 g) of tofu. Eat some foods each day that contain healthy fats, such as avocado, nuts, seeds, and fish. What foods should I eat? Fruits Berries. Apples. Oranges. Peaches. Apricots. Plums. Grapes. Mangoes. Papayas. Pomegranates. Kiwi. Cherries. Vegetables Leafy greens, including lettuce, spinach, kale, chard, collard greens, mustard greens, and cabbage. Beets. Cauliflower. Broccoli. Carrots. Green beans. Tomatoes. Peppers. Onions. Cucumbers. Brussels sprouts. Grains Whole grains, such as whole-wheat or whole-grain bread, crackers, tortillas, cereal, and pasta. Unsweetened oatmeal. Quinoa. Brown or wild rice. Meats and other proteins Seafood. Poultry without skin. Lean cuts of poultry and beef. Tofu. Nuts. Seeds. Dairy Low-fat or fat-free dairy products such as milk, yogurt, and cheese. The items listed above may not be a complete list of foods and beverages you can eat and drink. Contact a dietitian for more information. What foods should I avoid? Fruits Fruits canned with  syrup. Vegetables Canned vegetables. Frozen vegetables with butter or cream sauce. Grains Refined white flour and flour products such as bread, pasta, snack foods, and cereals. Avoid all processed foods. Meats and other proteins Fatty cuts of meat. Poultry with skin. Breaded or fried meats. Processed meat. Avoid saturated fats. Dairy Full-fat yogurt, cheese, or milk. Beverages Sweetened drinks, such as soda or iced tea. The items listed above may not be a complete list of foods and beverages you should avoid. Contact a dietitian for more information. Questions to ask a health care provider Do I need to meet with a certified diabetes care and education specialist? Do I need to meet with a dietitian? What number can I call if I have questions? When are the best times to check my blood glucose? Where to find more information: American Diabetes Association: diabetes.org Academy of Nutrition and Dietetics: eatright.[employer] of Diabetes and Digestive and Kidney Diseases: [employer].is Association of Diabetes Care & Education Specialists: diabeteseducator.org Summary It is important to have healthy eating habits because your blood sugar (glucose) levels are greatly affected by what you eat and drink. It is important to use alcohol carefully. A healthy meal plan will help you manage  your blood glucose and lower your risk of heart disease. Your health care provider may recommend that you work with a dietitian to make a meal plan that is best for you. This information is not intended to replace advice given to you by your health care provider. Make sure you discuss any questions you have with your health care provider. Document Revised: 01/20/2020 Document Reviewed: 01/20/2020 Elsevier Patient Education  [place].

## 2022-05-16 NOTE — Progress Notes (Signed)
Subjective:  Patient ID: Daniel [name], male    DOB: 07-29-1950  Age: 71 y.o. MRN: [id number]  CC:  Chief Complaint  Patient presents with   GI Problem    Pt states he has had stomach issues for the last 8 weeks off and on went one week with diarrhea pt has been taking imodium and Pepto bismol    Depression    PHQ9 - 8     HPI [name] presents for   Diarrhea Noted since starting farxiga, past 8 weeks. Treats with immodium or pepto. Improves. Usually one dose. Most days. Still taking metformin 1063m BID. Has taken additional 1/2-1 metformin few days per week to help with blood sugars - variation 80-202. Some room for improvement in diet, some junk food.  One episode vomiting 6 weeks ago - no recurrence. Episodic nausea.  Episodic abd pain - improves after BM, cramping.  No melena, hematochezia.  Usual 2 bm's per day.  Colonoscopy, endoscopy in May. Few polyps, diverticulosis.  Diarrhea after studies in June, improved then returned.  Omeprazole controlling heartburn, but hiccups when eating at times. No dysphagia. No fever, some weight loss with 8 days of diarrhea. No night sweats.   Has not discussed with GI.   [facility]   Depression Treated with Zoloft 150 mg daily.  Last discussed in September, improved insomnia with fluid restriction at bedtime.  No SI. Feeling more tired recently. Past 6 weeks.  Sleeping 6-6.5 hrs per night - improved.  Waking up with nocturia 2 per night average . 0-4.  Still trying to avoid fluids before bedtime.  Daytime somnolence - CBG 120-130 at the time. Lowest 74-80. No sx hypoglycemia.  Dozing off easier past 6 weeks.  Anemia with HGB 10.4 in May.  Speech slurred when talking to dtr after waking up few days ago. Lasted 2-3  minutes, no weakness, no facial droop. Had just been in a deep sleep.  No recurrence, not other speech changes. No recent falls.        05/16/2022    9:31 AM 03/15/2022    9:04  AM 02/15/2022    1:36 PM 02/15/2022    1:33 PM 01/12/2022    8:26 AM  Depression screen PHQ 2/9  Decreased Interest 1 0 0 0 0  Down, Depressed, Hopeless 1 0 0 0 0  PHQ - 2 Score 2 0 0 0 0  Altered sleeping 2 0   1  Tired, decreased energy 2 0   1  Change in appetite 1 0   1  Feeling bad or failure about yourself  0 0   0  Trouble concentrating 0 0   0  Moving slowly or fidgety/restless 1 0   0  Suicidal thoughts 0 0   0  PHQ-9 Score 8 0   3  Difficult doing work/chores     Not difficult at all     History 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   Trigger finger, left little finger 02/04/2020   Carpal tunnel syndrome, left upper limb 11/24/2019   Unilateral primary osteoarthritis, left knee 11/02/2019   Unilateral primary osteoarthritis, right knee 11/02/2019   Carpal tunnel syndrome, right upper limb 02/18/2018   Anxiety and depression 10/28/2016   Gastroesophageal reflux disease without esophagitis 10/28/2016   Seasonal allergic rhinitis due to pollen 10/28/2016   Primary osteoarthritis of  left knee 07/25/2015   Essential hypertension 07/25/2015   Hypercholesteremia 07/25/2015   Type 2 diabetes mellitus with diabetic polyneuropathy, without long-term current use of insulin ([place]) 07/25/2015   Pollen allergies 02/10/2014   Allergic rhinitis due to animal (cat) (dog) hair and dander 09/15/2012   Extrinsic asthma 09/13/2011   Proteinuria 11/24/2010   Past Medical History:  Diagnosis Date   Allergic rhinitis    Allergy    Anxiety    Arthritis    Benign hypertension    Cataract    Depression    Diabetes mellitus ([place])    type 2    GERD (gastroesophageal reflux disease)    History of pneumonia    HLD (hyperlipidemia)    Neuromuscular disorder ([place])    pt denies at 8/19/visit    Pneumonia    hx of several times per pt - last episode 4-5 years ago    Past Surgical History:  Procedure Laterality Date    CARPAL TUNNEL RELEASE Bilateral 2020 Right, 2021 left   CATARACT EXTRACTION     bilateral   EYE SURGERY     KNEE SURGERY     right   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   Procedure: LEFT TOTAL KNEE ARTHROPLASTY;  Surgeon: Mcarthur Rossetti, MD;  Location: [facility];  Service: Orthopedics;  Laterality: Left;   Allergies  Allergen Reactions   Actos [Pioglitazone] Other (See Comments), Shortness Of Breath and Swelling    Blood sugar went up and down and sweating profusely   Codeine Shortness Of Breath and Palpitations   Prior to Admission medications   Medication Sig Start Date End Date Taking? Authorizing Provider  albuterol (VENTOLIN HFA) 108 (90 Base) MCG/ACT inhaler INHALE 2 PUFFS BY MOUTH EVERY 6 HOURS AS NEEDED FOR WHEEZING OR SHORTNESS OF BREATH 03/15/22  Yes Wendie Agreste, MD  aspirin EC 325 MG EC tablet Take 1 tablet (325 mg total) by mouth 2 (two) times daily after a meal. Patient taking differently: Take 81 mg by mouth 2 (two) times daily after a meal. 02/27/20  Yes Mcarthur Rossetti, MD  blood glucose meter kit and supplies 1 each by Other route as directed. Dispense based on patient and insurance preference. Check once per day (variable times).  Accu check. 07/29/18  Yes Wendie Agreste, MD  cetirizine (ZYRTEC) 10 MG tablet Take 10 mg by mouth daily as needed for allergies.    Yes [provider]  Cholecalciferol (VITAMIN D) 125 MCG (5000 UT) CAPS Take 5,000 Units by mouth daily.   Yes [provider]  Cyanocobalamin (VITAMIN B 12 PO) Take 1,000 mg by mouth daily.    Yes [provider]  dapagliflozin propanediol (FARXIGA) 10 MG TABS tablet Take 1 tablet (10 mg total) by mouth daily before breakfast. 03/21/22  Yes Wendie Agreste, MD  diclofenac Sodium (VOLTAREN) 1 % GEL Apply 2 g topically daily.   Yes [provider]  ferrous sulfate 325 (65 FE) MG EC tablet Take 1 tablet (325 mg total) by mouth daily with breakfast. 10/05/21   Yes Armbruster, Carlota Raspberry, MD  fluticasone (FLONASE) 50 MCG/ACT nasal spray Place 1 spray into both nostrils daily.   Yes [provider]  gabapentin (NEURONTIN) 300 MG capsule TAKE 1 TO 2 CAPSULES BY MOUTH TWICE DAILY AS NEEDED 04/20/22  Yes Wendie Agreste, MD  glimepiride (AMARYL) 4 MG tablet Take 1 tablet (4 mg total) by mouth 2 (two) times daily. 03/15/22  Yes Wendie Agreste,  MD  hydrochlorothiazide (MICROZIDE) 12.5 MG capsule Take 1 capsule (12.5 mg total) by mouth daily. 03/15/22  Yes Wendie Agreste, MD  Lancets [facility] DELICA PLUS [id number]) MISC USE 1 TO CHECK GLUCOSE ONCE DAILY 08/04/18  Yes [provider]  lisinopril (ZESTRIL) 20 MG tablet Take 1 tablet (20 mg total) by mouth daily. 03/15/22  Yes Wendie Agreste, MD  metFORMIN (GLUCOPHAGE) 1000 MG tablet Take 1 tablet (1,000 mg total) by mouth 2 (two) times daily with a meal. 03/15/22  Yes Wendie Agreste, MD  montelukast (SINGULAIR) 10 MG tablet Take 1 tablet (10 mg total) by mouth at bedtime. 03/15/22  Yes Wendie Agreste, MD  Multiple Vitamin (MULTIVITAMIN WITH MINERALS) TABS tablet Take 1 tablet by mouth daily.   Yes [provider]  omeprazole (PRILOSEC) 20 MG capsule Take 1 capsule (20 mg total) by mouth daily. 03/15/22  Yes Wendie Agreste, MD  [facility] ULTRA test strip USE 1 STRIP TO CHECK GLUCOSE ONCE DAILY 11/25/18  Yes Wendie Agreste, MD  pravastatin (PRAVACHOL) 80 MG tablet Take 1 tablet (80 mg total) by mouth daily. 08/30/21  Yes Wendie Agreste, MD  sertraline (ZOLOFT) 100 MG tablet 1 tablet daily. 03/15/22  Yes Wendie Agreste, MD  sertraline (ZOLOFT) 50 MG tablet Take 1 tablet (50 mg total) by mouth daily. Total dose 171m. 03/15/22  Yes GWendie Agreste MD  traMADol (ULTRAM) 50 MG tablet TAKE 1 TABLET BY MOUTH EVERY 6 HOURS AS NEEDED 04/05/22  Yes GWendie Agreste MD  vitamin E 180 MG (400 UNITS) capsule Take 400 Units by mouth daily.   Yes [provider]   cyclobenzaprine (FLEXERIL) 10 MG tablet Take 1 tablet (10 mg total) by mouth 3 (three) times daily as needed for muscle spasms. Patient not taking: Reported on 03/15/2022 10/26/20   MMaximiano Coss NP  liraglutide (VICTOZA) 18 MG/3ML SOPN Inject 1.8 mg into the skin daily. Start 0.655mqd for 1 week, then 1.51m50mD for 1 week, then 1.8mg59m. Patient not taking: Reported on 05/16/2022 08/30/21   GreeWendie Agreste  ondansetron (ZOFRAN ODT) 4 MG disintegrating tablet Take 1 tablet (4 mg total) by mouth every 8 (eight) hours as needed for nausea or vomiting. Patient not taking: Reported on 03/15/2022 02/27/20   BlacMcarthur Rossetti  sodium chloride (OCEAN) 0.65 % SOLN nasal spray Place 1 spray into both nostrils as needed for congestion. 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   Smoking status: Never   Smokeless tobacco: Never  Vaping Use   Vaping Use: Never used  Substance and Sexual Activity   Alcohol use: No   Drug use: No   Sexual activity: Not Currently  Other Topics Concern   Not on file  Social History Narrative   Not on file   Social Determinants of Health   Financial Resource Strain: Medium Risk (02/15/2022)   Overall Financial Resource Strain (CARDIA)    Difficulty of Paying Living Expenses: Somewhat hard  Food Insecurity: No Food Insecurity (02/15/2022)   Hunger Vital Sign    Worried About Running Out of Food in the Last Year: Never true  Ran Out of Food in the Last Year: Never true  Transportation Needs: No Transportation Needs (02/15/2022)   PRAPARE - Hydrologist (Medical): No    Lack of Transportation (Non-Medical): No  Physical Activity:  Insufficiently Active (02/15/2022)   Exercise Vital Sign    Days of Exercise per Week: 3 days    Minutes of Exercise per Session: 20 min  Stress: No Stress Concern Present (02/15/2022)   [place]    Feeling of Stress : Not at all  Social Connections: Moderately Integrated (02/15/2022)   Social Connection and Isolation Panel [NHANES]    Frequency of Communication with Friends and Family: Three times a week    Frequency of Social Gatherings with Friends and Family: Three times a week    Attends Religious Services: More than 4 times per year    Active Member of Clubs or Organizations: No    Attends Archivist Meetings: Never    Marital Status: Married  Human resources officer Violence: Not At Risk (02/15/2022)   Humiliation, Afraid, Rape, and Kick questionnaire    Fear of Current or Ex-Partner: No    Emotionally Abused: No    Physically Abused: No    Sexually Abused: No    Review of Systems  Per HPI.  Objective:   Vitals:   05/16/22 0934  BP: 138/60  Pulse: 66  Temp: 98.3 F (36.8 C)  SpO2: 99%  Weight: 232 lb 12.8 oz (105.6 kg)  Height: _0  (1.727 m)     Physical Exam Vitals reviewed.  Constitutional:      Appearance: He is well-developed.  HENT:     Head: Normocephalic and atraumatic.  Neck:     Vascular: No carotid bruit or JVD.  Cardiovascular:     Rate and Rhythm: Normal rate and regular rhythm.     Heart sounds: Normal heart sounds. No murmur heard. Pulmonary:     Effort: Pulmonary effort is normal.     Breath sounds: Normal breath sounds. No rales.  Abdominal:     General: Abdomen is flat. Bowel sounds are normal. There is no distension.     Tenderness: There is no abdominal tenderness. There is no guarding.  Musculoskeletal:     Right lower leg: No edema.     Left lower leg: No edema.  Skin:    General: Skin is warm and dry.  Neurological:     General: No focal deficit present.      Mental Status: He is alert and oriented to person, place, and time.     GCS: GCS eye subscore is 4. GCS verbal subscore is 5. GCS motor subscore is 6.     Cranial Nerves: No cranial nerve deficit, dysarthria or facial asymmetry.     Motor: Motor function is intact.     Coordination: Coordination is intact. Coordination normal. Finger-Nose-Finger Test and Heel to [facility] Test normal. Rapid alternating movements normal.     Gait: Gait is intact.  Psychiatric:        Mood and Affect: Mood normal.    45 minutes spent during visit, including chart review, review of multiple concerns above including diarrhea, fatigue, diabetes readings and plan, counseling and assimilation of information, exam, discussion of plan, and chart completion.   Assessment & Plan:  ADAEL CULBREATH is a 71 y.o. male . Diarrhea, unspecified type Type 2 diabetes mellitus with diabetic polyneuropathy, without long-term current use of insulin (  [place]) - Plan: Amb ref to Medical Nutrition Therapy-MNT  -Question whether the additional metformin he is taking may be contributing to diarrhea.  Recommend against any additional metformin and if persistent diarrhea may need to change from metformin to other agent.  If persistent diarrhea recommended follow-up with his gastroenterologist.  Short-term Imodium if needed.  ER/RTC precautions given, including if recurrence of vomiting or worsening symptoms.  -Will refer to nutritionist for advice on diet with diabetes as appears to be significant variability in readings, likely diet component.  Fatigue, unspecified type - Plan: Ambulatory referral to Sleep Studies, Basic metabolic panel, CBC with Differential/Platelet Daytime somnolence - Plan: Ambulatory referral to Sleep Studies  -Symptoms above concerning for sleep apnea, refer to sleep specialist.  Do not drive or operate machinery if feeling sedated.  Single episode of slurred speech after being woke up with phone call, no recurrence, unlikely  TIA/CVA, suspected fatigue and symptoms just after waking up only.  Reassuring neurologic exam.  ER precautions given.  Check labs as above including BMP and CBC for fatigue.  Anemia, unspecified type - Plan: CBC with Differential/Platelet  -With fatigue as above, repeat CBC  No orders of the defined types were placed in this encounter.  Patient Instructions  Do not take any additional metformin - that may be causing some of the diarrhea.  Call Dr. Havery Moros about diarrhea and plan if continues after stopping additional metformin. Immodium for now short term ok. We may need to decrease metformin dose if diarrhea persists.  See info below on diet with diabetes. I am referring you to nutritionist as well. Recheck 1 month for A1c and diabetes med options.   If any return of slurred speech, or any weakness be seen in ER.  Return to the clinic or go to the nearest emergency room if any of your symptoms worsen or new symptoms occur.    Diabetes Mellitus and Nutrition, Adult When you have diabetes, or diabetes mellitus, it is very important to have healthy eating habits because your blood sugar (glucose) levels are greatly affected by what you eat and drink. Eating healthy foods in the right amounts, at about the same times every day, can help you: Manage your blood glucose. Lower your risk of heart disease. Improve your blood pressure. Reach or maintain a healthy weight. What can affect my meal plan? Every person with diabetes is different, and each person has different needs for a meal plan. Your health care provider may recommend that you work with a dietitian to make a meal plan that is best for you. Your meal plan may vary depending on factors such as: The calories you need. The medicines you take. Your weight. Your blood glucose, blood pressure, and cholesterol levels. Your activity level. Other health conditions you have, such as heart or kidney disease. How do carbohydrates affect  me? Carbohydrates, also called carbs, affect your blood glucose level more than any other type of food. Eating carbs raises the amount of glucose in your blood. It is important to know how many carbs you can safely have in each meal. This is different for every person. Your dietitian can help you calculate how many carbs you should have at each meal and for each snack. How does alcohol affect me? Alcohol can cause a decrease in blood glucose (hypoglycemia), especially if you use insulin or take certain diabetes medicines by mouth. Hypoglycemia can be a life-threatening condition. Symptoms of hypoglycemia, such as sleepiness, dizziness, and confusion, are similar to  symptoms of having too much alcohol. Do not drink alcohol if: Your health care provider tells you not to drink. You are pregnant, may be pregnant, or are planning to become pregnant. If you drink alcohol: Limit how much you have to: 0-1 drink a day for women. 0-2 drinks a day for men. Know how much alcohol is in your drink. In the [place] one drink equals one 12 oz bottle of beer (355 mL), one 5 oz glass of wine (148 mL), or one 1 oz glass of hard liquor (44 mL). Keep yourself hydrated with water, diet soda, or unsweetened iced tea. Keep in mind that regular soda, juice, and other mixers may contain a lot of sugar and must be counted as carbs. What are tips for following this plan?  Reading food labels Start by checking the serving size on the Nutrition Facts label of packaged foods and drinks. The number of calories and the amount of carbs, fats, and other nutrients listed on the label are based on one serving of the item. Many items contain more than one serving per package. Check the total grams (g) of carbs in one serving. Check the number of grams of saturated fats and trans fats in one serving. Choose foods that have a low amount or none of these fats. Check the number of milligrams (mg) of salt (sodium) in one serving. Most  people should limit total sodium intake to less than 2,300 mg per day. Always check the nutrition information of foods labeled as "low-fat" or "nonfat." These foods may be higher in added sugar or refined carbs and should be avoided. Talk to your dietitian to identify your daily goals for nutrients listed on the label. Shopping Avoid buying canned, pre-made, or processed foods. These foods tend to be high in fat, sodium, and added sugar. Shop around the outside edge of the grocery store. This is where you will most often find fresh fruits and vegetables, bulk grains, fresh meats, and fresh dairy products. Cooking Use low-heat cooking methods, such as baking, instead of high-heat cooking methods, such as deep frying. Cook using healthy oils, such as olive, canola, or sunflower oil. Avoid cooking with butter, cream, or high-fat meats. Meal planning Eat meals and snacks regularly, preferably at the same times every day. Avoid going long periods of time without eating. Eat foods that are high in fiber, such as fresh fruits, vegetables, beans, and whole grains. Eat 4-6 oz (112-168 g) of lean protein each day, such as lean meat, chicken, fish, eggs, or tofu. One ounce (oz) (28 g) of lean protein is equal to: 1 oz (28 g) of meat, chicken, or fish. 1 egg.  cup (62 g) of tofu. Eat some foods each day that contain healthy fats, such as avocado, nuts, seeds, and fish. What foods should I eat? Fruits Berries. Apples. Oranges. Peaches. Apricots. Plums. Grapes. Mangoes. Papayas. Pomegranates. Kiwi. Cherries. Vegetables Leafy greens, including lettuce, spinach, kale, chard, collard greens, mustard greens, and cabbage. Beets. Cauliflower. Broccoli. Carrots. Green beans. Tomatoes. Peppers. Onions. Cucumbers. Brussels sprouts. Grains Whole grains, such as whole-wheat or whole-grain bread, crackers, tortillas, cereal, and pasta. Unsweetened oatmeal. Quinoa. Brown or wild rice. Meats and other  proteins Seafood. Poultry without skin. Lean cuts of poultry and beef. Tofu. Nuts. Seeds. Dairy Low-fat or fat-free dairy products such as milk, yogurt, and cheese. The items listed above may not be a complete list of foods and beverages you can eat and drink. Contact a dietitian for more information. What  foods should I avoid? Fruits Fruits canned with syrup. Vegetables Canned vegetables. Frozen vegetables with butter or cream sauce. Grains Refined white flour and flour products such as bread, pasta, snack foods, and cereals. Avoid all processed foods. Meats and other proteins Fatty cuts of meat. Poultry with skin. Breaded or fried meats. Processed meat. Avoid saturated fats. Dairy Full-fat yogurt, cheese, or milk. Beverages Sweetened drinks, such as soda or iced tea. The items listed above may not be a complete list of foods and beverages you should avoid. Contact a dietitian for more information. Questions to ask a health care provider Do I need to meet with a certified diabetes care and education specialist? Do I need to meet with a dietitian? What number can I call if I have questions? When are the best times to check my blood glucose? Where to find more information: American Diabetes Association: diabetes.org Academy of Nutrition and Dietetics: eatright.[employer] of Diabetes and Digestive and Kidney Diseases: AmenCredit.is Association of Diabetes Care & Education Specialists: diabeteseducator.org Summary It is important to have healthy eating habits because your blood sugar (glucose) levels are greatly affected by what you eat and drink. It is important to use alcohol carefully. A healthy meal plan will help you manage your blood glucose and lower your risk of heart disease. Your health care provider may recommend that you work with a dietitian to make a meal plan that is best for you. This information is not intended to replace advice given to you by your health  care provider. Make sure you discuss any questions you have with your health care provider. Document Revised: 01/20/2020 Document Reviewed: 01/20/2020 Elsevier Patient Education  [place],   Merri Ray, MD [facility] 05/16/22 10:06 AM

## 2022-05-22 ENCOUNTER — Other Ambulatory Visit: Payer: Self-pay | Admitting: Family Medicine

## 2022-05-22 DIAGNOSIS — R7989 Other specified abnormal findings of blood chemistry: Secondary | ICD-10-CM

## 2022-05-22 DIAGNOSIS — E875 Hyperkalemia: Secondary | ICD-10-CM

## 2022-05-22 NOTE — Progress Notes (Signed)
See lab note.  

## 2022-05-22 NOTE — Progress Notes (Signed)
Pt states he is in [place] and he has an appointment next Wednesday

## 2022-05-30 ENCOUNTER — Other Ambulatory Visit (INDEPENDENT_AMBULATORY_CARE_PROVIDER_SITE_OTHER): Payer: Medicare Other

## 2022-05-30 DIAGNOSIS — R7989 Other specified abnormal findings of blood chemistry: Secondary | ICD-10-CM | POA: Diagnosis not present

## 2022-05-30 DIAGNOSIS — E875 Hyperkalemia: Secondary | ICD-10-CM | POA: Diagnosis not present

## 2022-05-30 LAB — BASIC METABOLIC PANEL
BUN: 47 mg/dL — ABNORMAL HIGH (ref 6–23)
CO2: 25 mEq/L (ref 19–32)
Calcium: 9.3 mg/dL (ref 8.4–10.5)
Chloride: 98 mEq/L (ref 96–112)
Creatinine, Ser: 1.97 mg/dL — ABNORMAL HIGH (ref 0.40–1.50)
GFR: 33.11 mL/min — ABNORMAL LOW (ref >60.00)
Glucose, Bld: 157 mg/dL — ABNORMAL HIGH (ref 70–99)
Potassium: 4 mEq/L (ref 3.5–5.1)
Sodium: 133 mEq/L — ABNORMAL LOW (ref 135–145)

## 2022-05-31 NOTE — Progress Notes (Signed)
Pt is scheduled for tomorrow at 11am

## 2022-06-01 ENCOUNTER — Encounter: Payer: Self-pay | Admitting: Family Medicine

## 2022-06-01 ENCOUNTER — Ambulatory Visit (INDEPENDENT_AMBULATORY_CARE_PROVIDER_SITE_OTHER): Payer: Medicare Other | Admitting: Family Medicine

## 2022-06-01 VITALS — BP 130/68 | HR 76 | Temp 98.1°F | Ht 68.0 in | Wt 223.2 lb

## 2022-06-01 DIAGNOSIS — R634 Abnormal weight loss: Secondary | ICD-10-CM | POA: Diagnosis not present

## 2022-06-01 DIAGNOSIS — E1142 Type 2 diabetes mellitus with diabetic polyneuropathy: Secondary | ICD-10-CM | POA: Diagnosis not present

## 2022-06-01 DIAGNOSIS — R197 Diarrhea, unspecified: Secondary | ICD-10-CM

## 2022-06-01 DIAGNOSIS — R7989 Other specified abnormal findings of blood chemistry: Secondary | ICD-10-CM | POA: Diagnosis not present

## 2022-06-01 LAB — BASIC METABOLIC PANEL
BUN: 37 mg/dL — ABNORMAL HIGH (ref 6–23)
CO2: 27 mEq/L (ref 19–32)
Calcium: 9.4 mg/dL (ref 8.4–10.5)
Chloride: 100 mEq/L (ref 96–112)
Creatinine, Ser: 1.87 mg/dL — ABNORMAL HIGH (ref 0.40–1.50)
GFR: 35.25 mL/min — ABNORMAL LOW (ref >60.00)
Glucose, Bld: 181 mg/dL — ABNORMAL HIGH (ref 70–99)
Potassium: 5.3 mEq/L — ABNORMAL HIGH (ref 3.5–5.1)
Sodium: 134 mEq/L — ABNORMAL LOW (ref 135–145)

## 2022-06-01 LAB — LIPASE: Lipase: 25 U/L (ref 11.0–59.0)

## 2022-06-01 MED ORDER — GLIMEPIRIDE 4 MG PO TABS: 4.0000 mg | ORAL_TABLET | Freq: Two times a day (BID) | ORAL | 1 refills | Status: DC

## 2022-06-01 NOTE — Progress Notes (Signed)
Subjective:  Patient ID: Daniel [name], male    DOB: 06/08/1951  Age: 71 y.o. MRN: [id number]  CC:  Chief Complaint  Patient presents with   Kidney Function    Pt is in due to kidney function results     HPI Daniel [name] presents for  Fatigue, elevated creatinine, hyperkalemia Last seen November 15 and at that time reported diarrhea since starting Farxiga approximately 8 weeks prior.  Had been taking Imodium or Pepto-Bismol with some improvement.  He was also continue to take metformin 1000 mg twice daily with an additional half to 1 metformin a few days per week to help with elevated blood sugars.  Single episode of vomiting 6 weeks prior but no recurrence.  Had noted episodic abdominal pain that improved after bowel movement with slight cramping.  2 bowel movements per day at that time.  Previous endoscopy, colonoscopy in May with few polyps and diverticulosis.  Omeprazole was controlling his heartburn but occasional hiccups with eating.  No dysphagia.  Additionally had noted some weight loss with prior diarrhea.  Increased fatigue past 6 weeks, nocturia 2 times per night discussed at last visit.  Lowest blood sugars in the 70 4080 range, no symptomatic hypoglycemia.  Also reported a single episode of slurred speech after waking up but quickly resolved.  No recurrence.  At last visit suspect that his additional metformin may be contributing to diarrhea, recommended against any additional doses, short-term Imodium and referred to nutritionist to help with choices for diabetes.  Fatigue was concerning for possible OSA.  Referred to sleep studies.  However we also check labs including BMP and CBC.  Hemoglobin overall stable at 11.3, increased from 10.4 in May. Potassium 5.5 on November 15, Normal at 4.0 on repeat November 29.  Creatinine has been increasing, previous baseline 1.17-1.28.  Elevated at 1.76 on November 15, recommended NSAID avoidance and increase fluids.  Repeat testing 2 days ago  higher at 1.97, EGFR 33.  Sodium low at that time at 133.  Stopped taking metformin as of yesterday.  No recent ibuprofen in past week. had been taking about 2 per day in past few months.  Diarrhea yesterday 2 times. Then resolved with immodium Still some fatigue. No abdominal pain, no fever, no n/v.  No blood in stool.  Urinating normally. No change in frequency, no dysuria/hematuria, no foamy urine.  Appt with sleep specialist next week, nutritionist 12/18.   Off farxiga and metformin past 2 days d/t worsening renal function. Took glimepiride. Glucose 188 this am.  Lost 8# with prior diarrheal illness, then regained weight until weight loss since last visit  - noticed past 2 weeks.  219 at home this morning, down from 223 about a week ago.  Lab Results  Component Value Date   HGBA1C 8.3 (H) 03/15/2022   Wt Readings from Last 3 Encounters:  06/01/22 223 lb 3.2 oz (101.2 kg)  05/16/22 232 lb 12.8 oz (105.6 kg)  03/15/22 232 lb 3.2 oz (105.3 kg)    History 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   Trigger finger, left little finger 02/04/2020   Carpal tunnel syndrome, left upper limb 11/24/2019   Unilateral primary osteoarthritis, left knee 11/02/2019   Unilateral primary osteoarthritis, right knee 11/02/2019   Carpal tunnel syndrome, right upper limb 02/18/2018   Anxiety and depression 10/28/2016   Gastroesophageal reflux disease without esophagitis 10/28/2016   Seasonal allergic rhinitis due  to pollen 10/28/2016   Primary osteoarthritis of left knee 07/25/2015   Essential hypertension 07/25/2015   Hypercholesteremia 07/25/2015   Type 2 diabetes mellitus with diabetic polyneuropathy, without long-term current use of insulin ([place]) 07/25/2015   Pollen allergies 02/10/2014   Allergic rhinitis due to animal (cat) (dog) hair and dander 09/15/2012   Extrinsic asthma 09/13/2011   Proteinuria 11/24/2010   Past Medical  History:  Diagnosis Date   Allergic rhinitis    Allergy    Anxiety    Arthritis    Benign hypertension    Cataract    Depression    Diabetes mellitus (HCC)    type 2    GERD (gastroesophageal reflux disease)    History of pneumonia    HLD (hyperlipidemia)    Neuromuscular disorder ([place])    pt denies at 8/19/visit    Pneumonia    hx of several times per pt - last episode 4-5 years ago    Past Surgical History:  Procedure Laterality Date   CARPAL TUNNEL RELEASE Bilateral 2020 Right, 2021 left   CATARACT EXTRACTION     bilateral   EYE SURGERY     KNEE SURGERY     right   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   Procedure: LEFT TOTAL KNEE ARTHROPLASTY;  Surgeon: Mcarthur Rossetti, MD;  Location: [facility];  Service: Orthopedics;  Laterality: Left;   Allergies  Allergen Reactions   Codeine Palpitations, Shortness Of Breath and Other (See Comments)   Pioglitazone Other (See Comments), Shortness Of Breath and Swelling    Blood sugar went up and down and sweating profusely   Prior to Admission medications   Medication Sig Start Date End Date Taking? Authorizing Provider  albuterol (VENTOLIN HFA) 108 (90 Base) MCG/ACT inhaler INHALE 2 PUFFS BY MOUTH EVERY 6 HOURS AS NEEDED FOR WHEEZING OR SHORTNESS OF BREATH 03/15/22  Yes Wendie Agreste, MD  aspirin EC 325 MG EC tablet Take 1 tablet (325 mg total) by mouth 2 (two) times daily after a meal. Patient taking differently: Take 81 mg by mouth 2 (two) times daily after a meal. 02/27/20  Yes Mcarthur Rossetti, MD  blood glucose meter kit and supplies 1 each by Other route as directed. Dispense based on patient and insurance preference. Check once per day (variable times).  Accu check. 07/29/18  Yes Wendie Agreste, MD  Cholecalciferol (VITAMIN D) 125 MCG (5000 UT) CAPS Take 5,000 Units by mouth daily.   Yes [provider]  Cyanocobalamin (VITAMIN B 12 PO) Take 1,000 mg by mouth daily.    Yes [provider]   diclofenac Sodium (VOLTAREN) 1 % GEL Apply 2 g topically daily.   Yes [provider]  ferrous sulfate 325 (65 FE) MG EC tablet Take 1 tablet (325 mg total) by mouth daily with breakfast. 10/05/21  Yes Armbruster, Carlota Raspberry, MD  gabapentin (NEURONTIN) 300 MG capsule TAKE 1 TO 2 CAPSULES BY MOUTH TWICE DAILY AS NEEDED 04/20/22  Yes Wendie Agreste, MD  glimepiride (AMARYL) 4 MG tablet Take 1 tablet (4 mg total) by mouth 2 (two) times daily. 03/15/22  Yes Wendie Agreste, MD  hydrochlorothiazide (MICROZIDE) 12.5 MG capsule Take 1 capsule (12.5 mg total) by mouth daily. 03/15/22  Yes Wendie Agreste, MD  Lancets [facility] DELICA PLUS [id number]) MISC USE 1 TO CHECK GLUCOSE ONCE DAILY 08/04/18  Yes [provider]  lisinopril (ZESTRIL) 20 MG tablet Take 1 tablet (20 mg total) by mouth daily. 03/15/22  Yes Wendie Agreste, MD  montelukast (SINGULAIR) 10 MG tablet Take 1 tablet (10 mg total) by mouth at bedtime. 03/15/22  Yes Wendie Agreste, MD  Multiple Vitamin (MULTIVITAMIN WITH MINERALS) TABS tablet Take 1 tablet by mouth daily.   Yes [provider]  omeprazole (PRILOSEC) 20 MG capsule Take 1 capsule (20 mg total) by mouth daily. 03/15/22  Yes Wendie Agreste, MD  [facility] ULTRA test strip USE 1 STRIP TO CHECK GLUCOSE ONCE DAILY 11/25/18  Yes Wendie Agreste, MD  pravastatin (PRAVACHOL) 80 MG tablet Take 1 tablet (80 mg total) by mouth daily. 08/30/21  Yes Wendie Agreste, MD  sertraline (ZOLOFT) 100 MG tablet 1 tablet daily. 03/15/22  Yes Wendie Agreste, MD  sertraline (ZOLOFT) 50 MG tablet Take 1 tablet (50 mg total) by mouth daily. Total dose 19m. 03/15/22  Yes GWendie Agreste MD  traMADol (ULTRAM) 50 MG tablet TAKE 1 TABLET BY MOUTH EVERY 6 HOURS AS NEEDED 04/05/22  Yes GWendie Agreste MD  vitamin E 180 MG (400 UNITS) capsule Take 400 Units by mouth daily.   Yes [provider]  cetirizine (ZYRTEC) 10 MG tablet Take 10 mg by mouth daily as needed for  allergies.  Patient not taking: Reported on 06/01/2022    [provider]  cyclobenzaprine (FLEXERIL) 10 MG tablet Take 1 tablet (10 mg total) by mouth 3 (three) times daily as needed for muscle spasms. Patient not taking: Reported on 03/15/2022 10/26/20   MMaximiano Coss NP  dapagliflozin propanediol (FARXIGA) 10 MG TABS tablet Take 1 tablet (10 mg total) by mouth daily before breakfast. Patient not taking: Reported on 06/01/2022 03/21/22   GWendie Agreste MD  fluticasone ([facility] 50 MCG/ACT nasal spray Place 1 spray into both nostrils daily. Patient not taking: Reported on 06/01/2022    [provider]  liraglutide (VICTOZA) 18 MG/3ML SOPN Inject 1.8 mg into the skin daily. Start 0.663mqd for 1 week, then 1.42m34mD for 1 week, then 1.8mg75m. Patient not taking: Reported on 05/16/2022 08/30/21   GreeWendie Agreste  metFORMIN (GLUCOPHAGE) 1000 MG tablet Take 1 tablet (1,000 mg total) by mouth 2 (two) times daily with a meal. Patient not taking: Reported on 06/01/2022 03/15/22   GreeWendie Agreste  ondansetron (ZOFRAN ODT) 4 MG disintegrating tablet Take 1 tablet (4 mg total) by mouth every 8 (eight) hours as needed for nausea or vomiting. Patient not taking: Reported on 03/15/2022 02/27/20   BlacMcarthur Rossetti  sodium chloride (OCEAN) 0.65 % SOLN nasal spray Place 1 spray into both nostrils as needed for congestion. 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   Smoking status: Never   Smokeless tobacco: Never  Vaping Use   Vaping Use: Never used  Substance and Sexual Activity   Alcohol use: No   Drug use: No   Sexual activity: Not Currently  Other Topics Concern   Not on file   Social History Narrative   Not on file   Social Determinants of Health   Financial Resource Strain: Medium Risk (02/15/2022)   Overall Financial Resource Strain (CARDIA)  Difficulty of Paying Living Expenses: Somewhat hard  Food Insecurity: No Food Insecurity (02/15/2022)   Hunger Vital Sign    Worried About Running Out of Food in the Last Year: Never true    Ran Out of Food in the Last Year: Never true  Transportation Needs: No Transportation Needs (02/15/2022)   PRAPARE - Hydrologist (Medical): No    Lack of Transportation (Non-Medical): No  Physical Activity: Insufficiently Active (02/15/2022)   Exercise Vital Sign    Days of Exercise per Week: 3 days    Minutes of Exercise per Session: 20 min  Stress: No Stress Concern Present (02/15/2022)   [place]    Feeling of Stress : Not at all  Social Connections: Moderately Integrated (02/15/2022)   Social Connection and Isolation Panel [NHANES]    Frequency of Communication with Friends and Family: Three times a week    Frequency of Social Gatherings with Friends and Family: Three times a week    Attends Religious Services: More than 4 times per year    Active Member of Clubs or Organizations: No    Attends Archivist Meetings: Never    Marital Status: Married  Human resources officer Violence: Not At Risk (02/15/2022)   Humiliation, Afraid, Rape, and Kick questionnaire    Fear of Current or Ex-Partner: No    Emotionally Abused: No    Physically Abused: No    Sexually Abused: No    Review of Systems Per HPI  Objective:   Vitals:   06/01/22 1054  BP: 130/68  Pulse: 76  Temp: 98.1 F (36.7 C)  SpO2: 98%  Weight: 223 lb 3.2 oz (101.2 kg)  Height: _0  (1.727 m)     Physical Exam Vitals reviewed.  Constitutional:      Appearance: He is well-developed.  HENT:     Head: Normocephalic and atraumatic.  Neck:      Vascular: No carotid bruit or JVD.  Cardiovascular:     Rate and Rhythm: Normal rate and regular rhythm.     Heart sounds: Normal heart sounds. No murmur heard. Pulmonary:     Effort: Pulmonary effort is normal.     Breath sounds: Normal breath sounds. No rales.  Abdominal:     General: Abdomen is flat. There is no distension.     Palpations: There is no mass.     Tenderness: There is no abdominal tenderness.  Musculoskeletal:     Right lower leg: No edema.     Left lower leg: No edema.  Skin:    General: Skin is warm and dry.  Neurological:     Mental Status: He is alert and oriented to person, place, and time.  Psychiatric:        Mood and Affect: Mood normal.        Assessment & Plan:  Daniel [name] is a 71 y.o. male . Increase in serum creatinine from prior measurement - Plan: CT Abdomen Pelvis W Contrast, Lipase, Basic metabolic panel, Ambulatory referral to Nephrology  Weight loss - Plan: CT Abdomen Pelvis W Contrast, Lipase, Ambulatory referral to Nephrology  Diarrhea, unspecified type - Plan: CT Abdomen Pelvis W Contrast, Lipase  Type 2 diabetes mellitus with diabetic polyneuropathy, without long-term current use of insulin ([place]) - Plan: glimepiride (AMARYL) 4 MG tablet, Ambulatory referral to Nephrology  Increasing creatinine, concern for acute renal insufficiency/failure as above.  Minimal symptoms at this time other  than slight fatigue which may be multifactorial.  No nausea or vomiting.  Urinating normally.  Has stopped metformin, NSAIDs, [place] for now.  Weight loss as above since last visit with recurrent diarrhea, initially thought to be metformin related but did have some yesterday off metformin.  Few episodes only.  Appears euvolemic on exam, nontoxic appearance.  Overall reassuring endoscopy, colonoscopy earlier this year.  -Continue to avoid NSAIDs, stay off metformin, Farxiga for now with close monitoring of blood sugars.  Watch diet as some elevated  readings with dietary indiscretion previously.  -Repeat creatinine sodium on BMP.  ER precautions given if new or worsening symptoms.  Depending on levels may need closer follow-up but planned recheck in 10 days at this time.  -Check CT abdomen pelvis without IV contrast and lipase for weight loss and bowel changes as above.  -Refer to nephrology.    Meds ordered this encounter  Medications   glimepiride (AMARYL) 4 MG tablet    Sig: Take 1 tablet (4 mg total) by mouth 2 (two) times daily.    Dispense:  180 tablet    Refill:  1   Patient Instructions  Make sure to keep drinking fluids, avoid ibuprofen. See diet for diabetes below. I ordered CT and repeat labs today. Recheck with me in 10 days IF numbers are stable. I will let you know about the labs.   Return to the clinic or go to the nearest emergency room if any of your symptoms worsen or new symptoms occur.   Diabetes Mellitus and Nutrition, Adult When you have diabetes, or diabetes mellitus, it is very important to have healthy eating habits because your blood sugar (glucose) levels are greatly affected by what you eat and drink. Eating healthy foods in the right amounts, at about the same times every day, can help you: Manage your blood glucose. Lower your risk of heart disease. Improve your blood pressure. Reach or maintain a healthy weight. What can affect my meal plan? Every person with diabetes is different, and each person has different needs for a meal plan. Your health care provider may recommend that you work with a dietitian to make a meal plan that is best for you. Your meal plan may vary depending on factors such as: The calories you need. The medicines you take. Your weight. Your blood glucose, blood pressure, and cholesterol levels. Your activity level. Other health conditions you have, such as heart or kidney disease. How do carbohydrates affect me? Carbohydrates, also called carbs, affect your blood glucose level  more than any other type of food. Eating carbs raises the amount of glucose in your blood. It is important to know how many carbs you can safely have in each meal. This is different for every person. Your dietitian can help you calculate how many carbs you should have at each meal and for each snack. How does alcohol affect me? Alcohol can cause a decrease in blood glucose (hypoglycemia), especially if you use insulin or take certain diabetes medicines by mouth. Hypoglycemia can be a life-threatening condition. Symptoms of hypoglycemia, such as sleepiness, dizziness, and confusion, are similar to symptoms of having too much alcohol. Do not drink alcohol if: Your health care provider tells you not to drink. You are pregnant, may be pregnant, or are planning to become pregnant. If you drink alcohol: Limit how much you have to: 0-1 drink a day for women. 0-2 drinks a day for men. Know how much alcohol is in your drink. In  the [place] one drink equals one 12 oz bottle of beer (355 mL), one 5 oz glass of wine (148 mL), or one 1 oz glass of hard liquor (44 mL). Keep yourself hydrated with water, diet soda, or unsweetened iced tea. Keep in mind that regular soda, juice, and other mixers may contain a lot of sugar and must be counted as carbs. What are tips for following this plan?  Reading food labels Start by checking the serving size on the Nutrition Facts label of packaged foods and drinks. The number of calories and the amount of carbs, fats, and other nutrients listed on the label are based on one serving of the item. Many items contain more than one serving per package. Check the total grams (g) of carbs in one serving. Check the number of grams of saturated fats and trans fats in one serving. Choose foods that have a low amount or none of these fats. Check the number of milligrams (mg) of salt (sodium) in one serving. Most people should limit total sodium intake to less than 2,300 mg per  day. Always check the nutrition information of foods labeled as "low-fat" or "nonfat." These foods may be higher in added sugar or refined carbs and should be avoided. Talk to your dietitian to identify your daily goals for nutrients listed on the label. Shopping Avoid buying canned, pre-made, or processed foods. These foods tend to be high in fat, sodium, and added sugar. Shop around the outside edge of the grocery store. This is where you will most often find fresh fruits and vegetables, bulk grains, fresh meats, and fresh dairy products. Cooking Use low-heat cooking methods, such as baking, instead of high-heat cooking methods, such as deep frying. Cook using healthy oils, such as olive, canola, or sunflower oil. Avoid cooking with butter, cream, or high-fat meats. Meal planning Eat meals and snacks regularly, preferably at the same times every day. Avoid going long periods of time without eating. Eat foods that are high in fiber, such as fresh fruits, vegetables, beans, and whole grains. Eat 4-6 oz (112-168 g) of lean protein each day, such as lean meat, chicken, fish, eggs, or tofu. One ounce (oz) (28 g) of lean protein is equal to: 1 oz (28 g) of meat, chicken, or fish. 1 egg.  cup (62 g) of tofu. Eat some foods each day that contain healthy fats, such as avocado, nuts, seeds, and fish. What foods should I eat? Fruits Berries. Apples. Oranges. Peaches. Apricots. Plums. Grapes. Mangoes. Papayas. Pomegranates. Kiwi. Cherries. Vegetables Leafy greens, including lettuce, spinach, kale, chard, collard greens, mustard greens, and cabbage. Beets. Cauliflower. Broccoli. Carrots. Green beans. Tomatoes. Peppers. Onions. Cucumbers. Brussels sprouts. Grains Whole grains, such as whole-wheat or whole-grain bread, crackers, tortillas, cereal, and pasta. Unsweetened oatmeal. Quinoa. Brown or wild rice. Meats and other proteins Seafood. Poultry without skin. Lean cuts of poultry and beef. Tofu.  Nuts. Seeds. Dairy Low-fat or fat-free dairy products such as milk, yogurt, and cheese. The items listed above may not be a complete list of foods and beverages you can eat and drink. Contact a dietitian for more information. What foods should I avoid? Fruits Fruits canned with syrup. Vegetables Canned vegetables. Frozen vegetables with butter or cream sauce. Grains Refined white flour and flour products such as bread, pasta, snack foods, and cereals. Avoid all processed foods. Meats and other proteins Fatty cuts of meat. Poultry with skin. Breaded or fried meats. Processed meat. Avoid saturated fats. Dairy Full-fat yogurt, cheese, or  milk. Beverages Sweetened drinks, such as soda or iced tea. The items listed above may not be a complete list of foods and beverages you should avoid. Contact a dietitian for more information. Questions to ask a health care provider Do I need to meet with a certified diabetes care and education specialist? Do I need to meet with a dietitian? What number can I call if I have questions? When are the best times to check my blood glucose? Where to find more information: American Diabetes Association: diabetes.org Academy of Nutrition and Dietetics: eatright.[employer] of Diabetes and Digestive and Kidney Diseases: [employer]is Association of Diabetes Care & Education Specialists: diabeteseducator.org Summary It is important to have healthy eating habits because your blood sugar (glucose) levels are greatly affected by what you eat and drink. It is important to use alcohol carefully. A healthy meal plan will help you manage your blood glucose and lower your risk of heart disease. Your health care provider may recommend that you work with a dietitian to make a meal plan that is best for you. This information is not intended to replace advice given to you by your health care provider. Make sure you discuss any questions you have with your health  care provider. Document Revised: 01/20/2020 Document Reviewed: 01/20/2020 Elsevier Patient Education  [place]    Signed,   Merri Ray, MD [facility] 06/01/22 12:40 PM

## 2022-06-01 NOTE — Patient Instructions (Addendum)
Make sure to keep drinking fluids, avoid ibuprofen. See diet for diabetes below. I ordered CT and repeat labs today. Recheck with me in 10 days IF numbers are stable. I will let you know about the labs.   Return to the clinic or go to the nearest emergency room if any of your symptoms worsen or new symptoms occur.   Diabetes Mellitus and Nutrition, Adult When you have diabetes, or diabetes mellitus, it is very important to have healthy eating habits because your blood sugar (glucose) levels are greatly affected by what you eat and drink. Eating healthy foods in the right amounts, at about the same times every day, can help you: Manage your blood glucose. Lower your risk of heart disease. Improve your blood pressure. Reach or maintain a healthy weight. What can affect my meal plan? Every person with diabetes is different, and each person has different needs for a meal plan. Your health care provider may recommend that you work with a dietitian to make a meal plan that is best for you. Your meal plan may vary depending on factors such as: The calories you need. The medicines you take. Your weight. Your blood glucose, blood pressure, and cholesterol levels. Your activity level. Other health conditions you have, such as heart or kidney disease. How do carbohydrates affect me? Carbohydrates, also called carbs, affect your blood glucose level more than any other type of food. Eating carbs raises the amount of glucose in your blood. It is important to know how many carbs you can safely have in each meal. This is different for every person. Your dietitian can help you calculate how many carbs you should have at each meal and for each snack. How does alcohol affect me? Alcohol can cause a decrease in blood glucose (hypoglycemia), especially if you use insulin or take certain diabetes medicines by mouth. Hypoglycemia can be a life-threatening condition. Symptoms of hypoglycemia, such as sleepiness,  dizziness, and confusion, are similar to symptoms of having too much alcohol. Do not drink alcohol if: Your health care provider tells you not to drink. You are pregnant, may be pregnant, or are planning to become pregnant. If you drink alcohol: Limit how much you have to: 0-1 drink a day for women. 0-2 drinks a day for men. Know how much alcohol is in your drink. In the [place] one drink equals one 12 oz bottle of beer (355 mL), one 5 oz glass of wine (148 mL), or one 1 oz glass of hard liquor (44 mL). Keep yourself hydrated with water, diet soda, or unsweetened iced tea. Keep in mind that regular soda, juice, and other mixers may contain a lot of sugar and must be counted as carbs. What are tips for following this plan?  Reading food labels Start by checking the serving size on the Nutrition Facts label of packaged foods and drinks. The number of calories and the amount of carbs, fats, and other nutrients listed on the label are based on one serving of the item. Many items contain more than one serving per package. Check the total grams (g) of carbs in one serving. Check the number of grams of saturated fats and trans fats in one serving. Choose foods that have a low amount or none of these fats. Check the number of milligrams (mg) of salt (sodium) in one serving. Most people should limit total sodium intake to less than 2,300 mg per day. Always check the nutrition information of foods labeled as "low-fat" or "nonfat."  These foods may be higher in added sugar or refined carbs and should be avoided. Talk to your dietitian to identify your daily goals for nutrients listed on the label. Shopping Avoid buying canned, pre-made, or processed foods. These foods tend to be high in fat, sodium, and added sugar. Shop around the outside edge of the grocery store. This is where you will most often find fresh fruits and vegetables, bulk grains, fresh meats, and fresh dairy products. Cooking Use low-heat  cooking methods, such as baking, instead of high-heat cooking methods, such as deep frying. Cook using healthy oils, such as olive, canola, or sunflower oil. Avoid cooking with butter, cream, or high-fat meats. Meal planning Eat meals and snacks regularly, preferably at the same times every day. Avoid going long periods of time without eating. Eat foods that are high in fiber, such as fresh fruits, vegetables, beans, and whole grains. Eat 4-6 oz (112-168 g) of lean protein each day, such as lean meat, chicken, fish, eggs, or tofu. One ounce (oz) (28 g) of lean protein is equal to: 1 oz (28 g) of meat, chicken, or fish. 1 egg.  cup (62 g) of tofu. Eat some foods each day that contain healthy fats, such as avocado, nuts, seeds, and fish. What foods should I eat? Fruits Berries. Apples. Oranges. Peaches. Apricots. Plums. Grapes. Mangoes. Papayas. Pomegranates. Kiwi. Cherries. Vegetables Leafy greens, including lettuce, spinach, kale, chard, collard greens, mustard greens, and cabbage. Beets. Cauliflower. Broccoli. Carrots. Green beans. Tomatoes. Peppers. Onions. Cucumbers. Brussels sprouts. Grains Whole grains, such as whole-wheat or whole-grain bread, crackers, tortillas, cereal, and pasta. Unsweetened oatmeal. Quinoa. Brown or wild rice. Meats and other proteins Seafood. Poultry without skin. Lean cuts of poultry and beef. Tofu. Nuts. Seeds. Dairy Low-fat or fat-free dairy products such as milk, yogurt, and cheese. The items listed above may not be a complete list of foods and beverages you can eat and drink. Contact a dietitian for more information. What foods should I avoid? Fruits Fruits canned with syrup. Vegetables Canned vegetables. Frozen vegetables with butter or cream sauce. Grains Refined white flour and flour products such as bread, pasta, snack foods, and cereals. Avoid all processed foods. Meats and other proteins Fatty cuts of meat. Poultry with skin. Breaded or fried  meats. Processed meat. Avoid saturated fats. Dairy Full-fat yogurt, cheese, or milk. Beverages Sweetened drinks, such as soda or iced tea. The items listed above may not be a complete list of foods and beverages you should avoid. Contact a dietitian for more information. Questions to ask a health care provider Do I need to meet with a certified diabetes care and education specialist? Do I need to meet with a dietitian? What number can I call if I have questions? When are the best times to check my blood glucose? Where to find more information: American Diabetes Association: diabetes.org Academy of Nutrition and Dietetics: eatright.[employer] of Diabetes and Digestive and Kidney Diseases: AmenCredit.is Association of Diabetes Care & Education Specialists: diabeteseducator.org Summary It is important to have healthy eating habits because your blood sugar (glucose) levels are greatly affected by what you eat and drink. It is important to use alcohol carefully. A healthy meal plan will help you manage your blood glucose and lower your risk of heart disease. Your health care provider may recommend that you work with a dietitian to make a meal plan that is best for you. This information is not intended to replace advice given to you by your health care  provider. Make sure you discuss any questions you have with your health care provider. Document Revised: 01/20/2020 Document Reviewed: 01/20/2020 Elsevier Patient Education  [place]

## 2022-06-05 NOTE — Addendum Note (Signed)
Addended by: Carlota Raspberry, Oyinkansola Truax R on: 06/05/2022 10:00 AM   Modules accepted: Orders

## 2022-06-06 ENCOUNTER — Ambulatory Visit (HOSPITAL_BASED_OUTPATIENT_CLINIC_OR_DEPARTMENT_OTHER)
Admission: RE | Admit: 2022-06-06 | Discharge: 2022-06-06 | Disposition: A | Discharge status: Home / Self Care | Payer: Medicare Other | Source: Ambulatory Visit | Attending: Family Medicine | Admitting: Family Medicine

## 2022-06-06 DIAGNOSIS — K5730 Diverticulosis of large intestine without perforation or abscess without bleeding: Secondary | ICD-10-CM | POA: Diagnosis not present

## 2022-06-06 DIAGNOSIS — N179 Acute kidney failure, unspecified: Secondary | ICD-10-CM | POA: Diagnosis not present

## 2022-06-06 DIAGNOSIS — R197 Diarrhea, unspecified: Secondary | ICD-10-CM | POA: Insufficient documentation

## 2022-06-06 DIAGNOSIS — R634 Abnormal weight loss: Secondary | ICD-10-CM | POA: Diagnosis not present

## 2022-06-06 DIAGNOSIS — N1832 Chronic kidney disease, stage 3b: Secondary | ICD-10-CM | POA: Diagnosis not present

## 2022-06-06 DIAGNOSIS — K219 Gastro-esophageal reflux disease without esophagitis: Secondary | ICD-10-CM | POA: Diagnosis not present

## 2022-06-06 DIAGNOSIS — R7989 Other specified abnormal findings of blood chemistry: Secondary | ICD-10-CM | POA: Diagnosis not present

## 2022-06-06 DIAGNOSIS — E785 Hyperlipidemia, unspecified: Secondary | ICD-10-CM | POA: Diagnosis not present

## 2022-06-06 DIAGNOSIS — N183 Chronic kidney disease, stage 3 unspecified: Secondary | ICD-10-CM | POA: Diagnosis not present

## 2022-06-06 DIAGNOSIS — I129 Hypertensive chronic kidney disease with stage 1 through stage 4 chronic kidney disease, or unspecified chronic kidney disease: Secondary | ICD-10-CM | POA: Diagnosis not present

## 2022-06-06 DIAGNOSIS — N2581 Secondary hyperparathyroidism of renal origin: Secondary | ICD-10-CM | POA: Diagnosis not present

## 2022-06-06 DIAGNOSIS — E875 Hyperkalemia: Secondary | ICD-10-CM | POA: Diagnosis not present

## 2022-06-06 DIAGNOSIS — D631 Anemia in chronic kidney disease: Secondary | ICD-10-CM | POA: Diagnosis not present

## 2022-06-12 ENCOUNTER — Other Ambulatory Visit: Payer: Medicare Other

## 2022-06-13 ENCOUNTER — Ambulatory Visit (INDEPENDENT_AMBULATORY_CARE_PROVIDER_SITE_OTHER): Payer: Medicare Other | Admitting: Family Medicine

## 2022-06-13 ENCOUNTER — Encounter: Payer: Self-pay | Admitting: Family Medicine

## 2022-06-13 VITALS — BP 130/70 | HR 58 | Temp 97.7°F | Ht 68.0 in | Wt 232.2 lb

## 2022-06-13 DIAGNOSIS — E875 Hyperkalemia: Secondary | ICD-10-CM | POA: Diagnosis not present

## 2022-06-13 DIAGNOSIS — M25562 Pain in left knee: Secondary | ICD-10-CM

## 2022-06-13 DIAGNOSIS — E1142 Type 2 diabetes mellitus with diabetic polyneuropathy: Secondary | ICD-10-CM

## 2022-06-13 DIAGNOSIS — K5900 Constipation, unspecified: Secondary | ICD-10-CM

## 2022-06-13 DIAGNOSIS — G8929 Other chronic pain: Secondary | ICD-10-CM | POA: Diagnosis not present

## 2022-06-13 DIAGNOSIS — R7989 Other specified abnormal findings of blood chemistry: Secondary | ICD-10-CM | POA: Diagnosis not present

## 2022-06-13 DIAGNOSIS — M25561 Pain in right knee: Secondary | ICD-10-CM

## 2022-06-13 MED ORDER — TRAMADOL HCL 50 MG PO TABS: 50.0000 mg | ORAL_TABLET | Freq: Four times a day (QID) | ORAL | 0 refills | Status: DC | PRN

## 2022-06-13 NOTE — Progress Notes (Signed)
Subjective:  Patient ID: Daniel [name], male    DOB: [birth year]  Age: 71 y.o. MRN: [id number]  CC:  Chief Complaint  Patient presents with   Renal Function    Weight Loss   Constipation    Pt states he is constipated     HPI Daniel [name] presents for  Fatigue, elevated creatinine, hyperkalemia See prior visits.  Prior diarrheal illness, vomiting.  Loose stools thought to be due in part to extra doses of metformin.  Additionally had increasing creatinine, possible acute renal insufficiency.  Persistent fatigue with some concern for OSA.  He was referred to sleep studies, repeat labs.  Metformin was held due to increasing creatinine as well as his [place].  He was continue glimepiride for diabetes. 8 pound weight loss with diarrhea illness, then regained weight until some weight loss from November 15 through December 1st. Prior reassuring endoscopy, colonoscopy.  CT abdomen pelvis was ordered with weight loss and bowel changes.  Was also referred to nephrology.  CT abdomen/pelvis on December 6, large colonic stool burden but no obstruction, normal appendix, no hydronephrosis or nephrolithiasis, no acute intra-abdominal or pelvic pathology.  Aortic atherosclerosis and sigmoid diverticulosis noted.  Lipase was normal. Labs December 1, borderline hyperkalemia at 5.3, borderline hyponatremia at 134 and renal function improved with creatinine 1.87, down from 1.97.   Saw nephrology Wednesday last week. Dr. Candiss Norse. Told to eat low potassium diet. Also wanted to see BP a little lower, but no new meds. Had not had CT scan at that time. Had bloodwork at nephrology - told was slightly lower. He did not want him to restart metformin. Follow up in few months.   Taking glimepiride 5m BID.  Since 12/1 - sugars now up to 260 this morning.  Had been in the mid 100's usually. Only 3 readings over 200.  Had to use a few more tramadol when not taking ibuprofen. 4 per day for 5-6 days (unable to take  ibuprofen). Had been on 1-2 per day prior. Some restless legs and neuropathy. Taking gabapentin BID. Has appt with sleep specialist 1/4.  Needs refill of tramadol.  Controlled substance database (PDMP) reviewed. No concerns appreciated.  Last filled 04/06/22.   Some constipation recently. Miralax once per day recently started yesterday. Usually helps in 1-2 days.  Urinating ok.   Appt with me next week.   Wt Readings from Last 3 Encounters:  06/13/22 232 lb 3.2 oz (105.3 kg)  06/01/22 223 lb 3.2 oz (101.2 kg)  05/16/22 232 lb 12.8 oz (105.6 kg)     History 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   Trigger finger, left little finger 02/04/2020   Carpal tunnel syndrome, left upper limb 11/24/2019   Unilateral primary osteoarthritis, left knee 11/02/2019   Unilateral primary osteoarthritis, right knee 11/02/2019   Carpal tunnel syndrome, right upper limb 02/18/2018   Anxiety and depression 10/28/2016   Gastroesophageal reflux disease without esophagitis 10/28/2016   Seasonal allergic rhinitis due to pollen 10/28/2016   Primary osteoarthritis of left knee 07/25/2015   Essential hypertension 07/25/2015   Hypercholesteremia 07/25/2015   Type 2 diabetes mellitus with diabetic polyneuropathy, without long-term current use of insulin ([place] 07/25/2015   Pollen allergies 02/10/2014   Allergic rhinitis due to animal (cat) (dog) hair and dander 09/15/2012   Extrinsic asthma 09/13/2011   Proteinuria 11/24/2010   Past Medical History:  Diagnosis Date   Allergic rhinitis  Allergy    Anxiety    Arthritis    Benign hypertension    Cataract    Depression    Diabetes mellitus ([place])    type 2    GERD (gastroesophageal reflux disease)    History of pneumonia    HLD (hyperlipidemia)    Neuromuscular disorder ([place])    pt denies at 8/19/visit    Pneumonia    hx of several times per pt - last episode 4-5 years ago    Past  Surgical History:  Procedure Laterality Date   CARPAL TUNNEL RELEASE Bilateral 2020 Right, 2021 left   CATARACT EXTRACTION     bilateral   EYE SURGERY     KNEE SURGERY     right   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   Procedure: LEFT TOTAL KNEE ARTHROPLASTY;  Surgeon: Mcarthur Rossetti, MD;  Location: [facility];  Service: Orthopedics;  Laterality: Left;   Allergies  Allergen Reactions   Codeine Palpitations, Shortness Of Breath and Other (See Comments)   Pioglitazone Other (See Comments), Shortness Of Breath and Swelling    Blood sugar went up and down and sweating profusely   Prior to Admission medications   Medication Sig Start Date End Date Taking? Authorizing Provider  albuterol (VENTOLIN HFA) 108 (90 Base) MCG/ACT inhaler INHALE 2 PUFFS BY MOUTH EVERY 6 HOURS AS NEEDED FOR WHEEZING OR SHORTNESS OF BREATH 03/15/22  Yes Wendie Agreste, MD  aspirin EC 325 MG EC tablet Take 1 tablet (325 mg total) by mouth 2 (two) times daily after a meal. Patient taking differently: Take 81 mg by mouth 2 (two) times daily after a meal. 02/27/20  Yes Mcarthur Rossetti, MD  blood glucose meter kit and supplies 1 each by Other route as directed. Dispense based on patient and insurance preference. Check once per day (variable times).  Accu check. 07/29/18  Yes Wendie Agreste, MD  cetirizine (ZYRTEC) 10 MG tablet Take 10 mg by mouth daily as needed for allergies.   Yes [provider]  Cholecalciferol (VITAMIN D) 125 MCG (5000 UT) CAPS Take 5,000 Units by mouth daily.   Yes [provider]  Cyanocobalamin (VITAMIN B 12 PO) Take 1,000 mg by mouth daily.    Yes [provider]  ferrous sulfate 325 (65 FE) MG EC tablet Take 1 tablet (325 mg total) by mouth daily with breakfast. 10/05/21  Yes Armbruster, Carlota Raspberry, MD  fluticasone (FLONASE) 50 MCG/ACT nasal spray Place 1 spray into both nostrils daily.   Yes [provider]  gabapentin (NEURONTIN) 300 MG capsule  TAKE 1 TO 2 CAPSULES BY MOUTH TWICE DAILY AS NEEDED 04/20/22  Yes Wendie Agreste, MD  glimepiride (AMARYL) 4 MG tablet Take 1 tablet (4 mg total) by mouth 2 (two) times daily. 06/01/22  Yes Wendie Agreste, MD  Lancets [facility] DELICA PLUS [id number]) MISC USE 1 TO CHECK GLUCOSE ONCE DAILY 08/04/18  Yes [provider]  lisinopril (ZESTRIL) 20 MG tablet Take 1 tablet (20 mg total) by mouth daily. 03/15/22  Yes Wendie Agreste, MD  montelukast (SINGULAIR) 10 MG tablet Take 1 tablet (10 mg total) by mouth at bedtime. 03/15/22  Yes Wendie Agreste, MD  Multiple Vitamin (MULTIVITAMIN WITH MINERALS) TABS tablet Take 1 tablet by mouth daily.   Yes [provider]  [facility] ULTRA test strip USE 1 STRIP TO CHECK GLUCOSE ONCE DAILY 11/25/18  Yes Wendie Agreste, MD  pravastatin (PRAVACHOL) 80 MG tablet Take 1  tablet (80 mg total) by mouth daily. 08/30/21  Yes Wendie Agreste, MD  sertraline (ZOLOFT) 100 MG tablet 1 tablet daily. 03/15/22  Yes Wendie Agreste, MD  sertraline (ZOLOFT) 50 MG tablet Take 1 tablet (50 mg total) by mouth daily. Total dose 15m. 03/15/22  Yes GWendie Agreste MD  traMADol (ULTRAM) 50 MG tablet TAKE 1 TABLET BY MOUTH EVERY 6 HOURS AS NEEDED 04/05/22  Yes GWendie Agreste MD  vitamin E 180 MG (400 UNITS) capsule Take 400 Units by mouth daily.   Yes [provider]  cyclobenzaprine (FLEXERIL) 10 MG tablet Take 1 tablet (10 mg total) by mouth 3 (three) times daily as needed for muscle spasms. Patient not taking: Reported on 03/15/2022 10/26/20   MMaximiano Coss NP  dapagliflozin propanediol (FARXIGA) 10 MG TABS tablet Take 1 tablet (10 mg total) by mouth daily before breakfast. Patient not taking: Reported on 06/01/2022 03/21/22   GWendie Agreste MD  diclofenac Sodium (VOLTAREN) 1 % GEL Apply 2 g topically daily. Patient not taking: Reported on 06/13/2022    [provider]  hydrochlorothiazide (MICROZIDE) 12.5 MG capsule Take 1 capsule  (12.5 mg total) by mouth daily. Patient not taking: Reported on 06/13/2022 03/15/22   GWendie Agreste MD  liraglutide (VICTOZA) 18 MG/3ML SOPN Inject 1.8 mg into the skin daily. Start 0.635mqd for 1 week, then 1.33m45mD for 1 week, then 1.8mg36m. Patient not taking: Reported on 05/16/2022 08/30/21   GreeWendie Agreste  metFORMIN (GLUCOPHAGE) 1000 MG tablet Take 1 tablet (1,000 mg total) by mouth 2 (two) times daily with a meal. Patient not taking: Reported on 06/01/2022 03/15/22   GreeWendie Agreste  omeprazole (PRILOSEC) 20 MG capsule Take 1 capsule (20 mg total) by mouth daily. Patient not taking: Reported on 06/13/2022 03/15/22   GreeWendie Agreste  ondansetron (ZOFRAN ODT) 4 MG disintegrating tablet Take 1 tablet (4 mg total) by mouth every 8 (eight) hours as needed for nausea or vomiting. Patient not taking: Reported on 03/15/2022 02/27/20   BlacMcarthur Rossetti  sodium chloride (OCEAN) 0.65 % SOLN nasal spray Place 1 spray into both nostrils as needed for congestion. 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   Smoking status: Never   Smokeless tobacco: Never  Vaping Use   Vaping Use: Never used  Substance and Sexual Activity   Alcohol use: No   Drug use: No   Sexual activity: Not Currently  Other Topics Concern   Not on file  Social History Narrative   Not on file   Social Determinants of Health   Financial Resource Strain: Medium Risk (02/15/2022)   Overall Financial Resource Strain (CARDIA)    Difficulty of Paying Living Expenses: Somewhat hard  Food Insecurity: No Food Insecurity (02/15/2022)   Hunger Vital Sign    Worried About Running Out of Food in the Last Year: Never  true    Ran Out of Food in the Last Year: Never true  Transportation Needs: No Transportation Needs (02/15/2022)   PRAPARE - TranHydrologistdical): No    Lack of Transportation (  Non-Medical): No  Physical Activity: Insufficiently Active (02/15/2022)   Exercise Vital Sign    Days of Exercise per Week: 3 days    Minutes of Exercise per Session: 20 min  Stress: No Stress Concern Present (02/15/2022)   [place]    Feeling of Stress : Not at all  Social Connections: Moderately Integrated (02/15/2022)   Social Connection and Isolation Panel [NHANES]    Frequency of Communication with Friends and Family: Three times a week    Frequency of Social Gatherings with Friends and Family: Three times a week    Attends Religious Services: More than 4 times per year    Active Member of Clubs or Organizations: No    Attends Archivist Meetings: Never    Marital Status: Married  Human resources officer Violence: Not At Risk (02/15/2022)   Humiliation, Afraid, Rape, and Kick questionnaire    Fear of Current or Ex-Partner: No    Emotionally Abused: No    Physically Abused: No    Sexually Abused: No    Review of Systems Per HPI.   Objective:   Vitals:   06/13/22 1418  BP: 130/70  Pulse: (!) 58  Temp: 97.7 F (36.5 C)  SpO2: 98%  Weight: 232 lb 3.2 oz (105.3 kg)  Height: _0  (1.727 m)     Physical Exam Vitals reviewed.  Constitutional:      Appearance: Normal appearance. He is well-developed.  HENT:     Head: Normocephalic and atraumatic.  Neck:     Vascular: No carotid bruit or JVD.  Cardiovascular:     Rate and Rhythm: Normal rate and regular rhythm.     Heart sounds: Normal heart sounds. No murmur heard. Pulmonary:     Effort: Pulmonary effort is normal.     Breath sounds: Normal breath sounds. No rales.  Abdominal:     General: Abdomen is flat.  Musculoskeletal:     Right lower  leg: No edema.     Left lower leg: No edema.  Skin:    General: Skin is warm and dry.  Neurological:     Mental Status: He is alert and oriented to person, place, and time.  Psychiatric:        Mood and Affect: Mood normal.      Assessment & Plan:  Daniel [name] is a 72 y.o. male . Increase in serum creatinine from prior measurement  -Recent nephrology visit with labs obtained at that time.  Records requested to review labs and plan, has follow-up visit with me next week and we can repeat labs at that time.  Continue to avoid nephrotoxins, hold on restart of Farxiga until I can review some labs but likely will restart given persistent hyperglycemia or discuss options with nephrology.  Hold metformin as well for now.  Chronic pain of both knees - Plan: traMADol (ULTRAM) 50 MG tablet  -Few increased doses needed off ibuprofen with recent travel.  Continue tramadol.  Up with sleep specialist as planned and can discuss restless leg symptoms at that time.  Hyperkalemia  -Recent labs with nephrology and was advised on low potassium diet.  Type 2 diabetes mellitus with diabetic polyneuropathy, without long-term current use of insulin ([place])  -As above, will hold on new meds until I review results from nephrology.  Continue glimepiride for now.  Constipation, unspecified constipation type  -Recently started MiraLAX, increase stool noted on CT.  MiraLAX up to twice per day  if needed with RTC precautions, recheck 1 week  Meds ordered this encounter  Medications   traMADol (ULTRAM) 50 MG tablet    Sig: Take 1 tablet (50 mg total) by mouth every 6 (six) hours as needed.    Dispense:  120 tablet    Refill:  0   Patient Instructions  I will look at notes and labs from nephrology to decide on diabetes med changes.  Keep follow up next week for repeat labs.  Miralax 1-2 times per day for constipation.  Tramadol refilled.   Return to the clinic or go to the nearest emergency room if any of  your symptoms worsen or new symptoms occur.     Signed,   Merri Ray, MD [place], [facility] 06/13/22 3:25 PM

## 2022-06-13 NOTE — Patient Instructions (Addendum)
I will look at notes and labs from nephrology to decide on diabetes med changes.  Keep follow up next week for repeat labs.  Miralax 1-2 times per day for constipation.  Tramadol refilled.   Return to the clinic or go to the nearest emergency room if any of your symptoms worsen or new symptoms occur.

## 2022-06-18 ENCOUNTER — Encounter: Payer: Medicare Other | Attending: Family Medicine | Admitting: Registered"

## 2022-06-18 ENCOUNTER — Encounter: Payer: Self-pay | Admitting: Registered"

## 2022-06-18 DIAGNOSIS — E1142 Type 2 diabetes mellitus with diabetic polyneuropathy: Secondary | ICD-10-CM | POA: Diagnosis not present

## 2022-06-18 NOTE — Patient Instructions (Addendum)
Use the DASH diet handout to track your food and potassium intake.  Tumeric with black pepper to help with pain, after clearing with doctor.  Continue using the Potassium food guideline as directed by your doctor considering carb content  Continue being mindful about portion sizes. Include protein with breakfast: meat, eggs, nuts, small amount of Greek yogurt.  Continue with your plan to start a exercise routine. Start with something realistic avoid injury, break up the time exercising during the day.  [facility] Address: [address], [place], [place] [address] Phone: [phone] Appointments: [email or web address] Lorenda Peck, DPM

## 2022-06-18 NOTE — Progress Notes (Signed)
Diabetes Self-Management Education  Visit Type: First/Initial  Appt. Start Time: 0935 Appt. End Time: 1050  06/18/2022  [name], identified by name and date of birth, is a 71 y.o. male with a diagnosis of Diabetes: Type 2.   ASSESSMENT  There were no vitals taken for this visit. There is no height or weight on file to calculate BMI.  Relevant PMH: HTN, h/o hypoglycemic episodes 2 years ago when was still working. Once fwas 40 mg/dL at [facility] ate candy bar & soda.  Lab Results  Component Value Date   HGBA1C 8.3 (H) 03/15/2022   Medications: glimepiride (AMARYL) 4 MG tablet Taking bid  Pt states his other DM medications were discontinued due to reduced kidney function and high potassium levels.  Pt states he used to follow the portion plate guidelines when first diagnosed, but got away from it and started eating larger portions. Pt states recently has cut back portions and has been hungry a lot. Pt states he wants to know how many carbs he should be eating.  Exercise: patient states exercise is limited due to knee pain. Pt reports he manages pain somewhat with tramadol. Pt states he discontinued using ibuprofen. Pt states in Jan planning on getting silver sneakers through insurance and start doing water exercises.   Diabetes Self-Management Education - 06/18/22 0924       Visit Information   Visit Type First/Initial      Initial Visit   Diabetes Type Type 2    Date Diagnosed 2006    Are you currently following a meal plan? No    Are you taking your medications as prescribed? No   when insurance covers will take more     Health Coping   How would you rate your overall health? Fair      Psychosocial Assessment   Patient Belief/Attitude about Diabetes Other (comment)   medications helped control, but now not working   What is the hardest part about your diabetes right now, causing you the most concern, or is the most worrisome to you about your diabetes?   Making  healty food and beverage choices    How often do you need to have someone help you when you read instructions, pamphlets, or other written materials from your doctor or pharmacy? 1 - Never    What is the last grade level you completed in school? bachelors of arts      Complications   Last HgB A1C per patient/outside source 8.3 %    How often do you check your blood sugar? 1-2 times/day    Fasting Blood glucose range (mg/dL) 130-179;>200   134-260   Postprandial Blood glucose range (mg/dL) --   130-179   Have you had a dilated eye exam in the past 12 months? No   plans to schedule soon   Have you had a dental exam in the past 12 months? Yes    Are you checking your feet? Yes    How many days per week are you checking your feet? 3      Dietary Intake   Breakfast 2 toast,peanut butter, black coffee    Snack (morning) none    Lunch casserole, green beans, sausage dip, 2 scoop chips, 1x1 fudge, small wedge of pecan pie, diet dr pepper.    Dinner sandwich, small amt of hashbrown casserole, green beans    Snack (evening) 1/2 cookie    Beverage(s) water, coffee, diet soda      Activity /  Exercise   Activity / Exercise Type ADL's      Patient Education   Previous Diabetes Education Yes (please comment)   2006 class   Disease Pathophysiology Definition of diabetes, type 1 and 2, and the diagnosis of diabetes    Healthy Eating Plate Method;Carbohydrate counting;Role of diet in the treatment of diabetes and the relationship between the three main macronutrients and blood glucose level    Being Active Role of exercise on diabetes management, blood pressure control and cardiac health.    Medications Reviewed patients medication for diabetes, action, purpose, timing of dose and side effects.    Monitoring Purpose and frequency of SMBG.      Individualized Goals (developed by patient)   Nutrition Carb counting    Physical Activity Exercise 3-5 times per week    Monitoring  Test my blood glucose  as discussed      Outcomes   Expected Outcomes Demonstrated interest in learning. Expect positive outcomes    Future DMSE PRN    Program Status Not Completed             Individualized Plan for Diabetes Self-Management Training:   Learning Objective:  Patient will have a greater understanding of diabetes self-management. Patient education plan is to attend individual and/or group sessions per assessed needs and concerns.  Patient Instructions  Use the DASH diet handout to track your food and potassium intake.  Tumeric with black pepper to help with pain, after clearing with doctor.  Continue using the Potassium food guideline as directed by your doctor considering carb content  Continue being mindful about portion sizes. Include protein with breakfast: meat, eggs, nuts, small amount of Greek yogurt.  Continue with your plan to start a exercise routine. Start with something realistic avoid injury, break up the time exercising during the day.  [facility] Address: [address], [place], [place] [address] Phone: [phone] Appointments: [email or web address] Lorenda Peck, DPM  Expected Outcomes:  Demonstrated interest in learning. Expect positive outcomes  Education material provided: My Plate and Support group flyer, glucose log book, DASH meal planning guide (pg 3-4 DASH brief, NIH)  If problems or questions, patient to contact team via:  Phone and MyChart  Future DSME appointment: PRN

## 2022-06-19 ENCOUNTER — Encounter: Payer: Self-pay | Admitting: Family Medicine

## 2022-06-20 ENCOUNTER — Ambulatory Visit (INDEPENDENT_AMBULATORY_CARE_PROVIDER_SITE_OTHER): Payer: Medicare Other | Admitting: Family Medicine

## 2022-06-20 ENCOUNTER — Encounter: Payer: Self-pay | Admitting: Family Medicine

## 2022-06-20 VITALS — BP 130/72 | HR 58 | Temp 98.2°F | Ht 68.0 in | Wt 233.6 lb

## 2022-06-20 DIAGNOSIS — J4520 Mild intermittent asthma, uncomplicated: Secondary | ICD-10-CM

## 2022-06-20 DIAGNOSIS — E7800 Pure hypercholesterolemia, unspecified: Secondary | ICD-10-CM

## 2022-06-20 DIAGNOSIS — I10 Essential (primary) hypertension: Secondary | ICD-10-CM | POA: Diagnosis not present

## 2022-06-20 DIAGNOSIS — N179 Acute kidney failure, unspecified: Secondary | ICD-10-CM

## 2022-06-20 DIAGNOSIS — E1142 Type 2 diabetes mellitus with diabetic polyneuropathy: Secondary | ICD-10-CM | POA: Diagnosis not present

## 2022-06-20 DIAGNOSIS — F32A Depression, unspecified: Secondary | ICD-10-CM | POA: Diagnosis not present

## 2022-06-20 LAB — BASIC METABOLIC PANEL
BUN: 45 mg/dL — ABNORMAL HIGH (ref 6–23)
CO2: 27 mEq/L (ref 19–32)
Calcium: 9.5 mg/dL (ref 8.4–10.5)
Chloride: 102 mEq/L (ref 96–112)
Creatinine, Ser: 1.83 mg/dL — ABNORMAL HIGH (ref 0.40–1.50)
GFR: 36.16 mL/min — ABNORMAL LOW (ref >60.00)
Glucose, Bld: 218 mg/dL — ABNORMAL HIGH (ref 70–99)
Potassium: 5.2 mEq/L — ABNORMAL HIGH (ref 3.5–5.1)
Sodium: 137 mEq/L (ref 135–145)

## 2022-06-20 LAB — HEMOGLOBIN A1C: Hgb A1c MFr Bld: 8.8 % — ABNORMAL HIGH (ref 4.6–6.5)

## 2022-06-20 MED ORDER — SERTRALINE HCL 50 MG PO TABS: 50.0000 mg | ORAL_TABLET | Freq: Every day | ORAL | 1 refills | Status: DC

## 2022-06-20 MED ORDER — SERTRALINE HCL 100 MG PO TABS: ORAL_TABLET | ORAL | 1 refills | Status: DC

## 2022-06-20 MED ORDER — LANTUS SOLOSTAR 100 UNIT/ML ~~LOC~~ SOPN: 5.0000 [IU] | PEN_INJECTOR | Freq: Every day | SUBCUTANEOUS | 1 refills | Status: DC

## 2022-06-20 MED ORDER — PEN NEEDLES 32G X 4 MM MISC: 1.0000 | Freq: Every day | 3 refills | Status: AC

## 2022-06-20 NOTE — Patient Instructions (Addendum)
Start lantus, 5 units per day, increase by 2 units every 2-3 days until readings are under 200. Stay at that dose when under 200. Watch for low blood sugar symptoms ( see below) with using glimepiride as well. If that occurs, will need to lower doses. Send me an update in next week.   Recheck in 2 weeks.  Return to the clinic or go to the nearest emergency room if any of your symptoms worsen or new symptoms occur.  Hypoglycemia Hypoglycemia is when the sugar (glucose) level in your blood is too low. Low blood sugar can happen to people who have diabetes and people who do not have diabetes. Low blood sugar can happen quickly, and it can be an emergency. What are the causes? This condition happens most often in people who have diabetes. It may be caused by: Diabetes medicine. Not eating enough, or not eating often enough. Doing more physical activity. Drinking alcohol on an empty stomach. If you do not have diabetes, this condition may be caused by: A tumor in the pancreas. Not eating enough, or not eating for long periods at a time (fasting). A very bad infection or illness. Problems after having weight loss (bariatric) surgery. Kidney failure or liver failure. Certain medicines. What increases the risk? This condition is more likely to develop in people who: Have diabetes and take medicines to lower their blood sugar. Abuse alcohol. Have a very bad illness. What are the signs or symptoms? Mild Hunger. Sweating and feeling clammy. Feeling dizzy or light-headed. Being sleepy or having trouble sleeping. Feeling like you may vomit (nauseous). A fast heartbeat. A headache. Blurry vision. Mood changes, such as: Being grouchy. Feeling worried or nervous (anxious). Tingling or loss of feeling (numbness) around your mouth, lips, or tongue. Moderate Confusion and poor judgment. Behavior changes. Weakness. Uneven heartbeat. Trouble with moving (coordination). Very low Very low  blood sugar (severe hypoglycemia) is a medical emergency. It can cause: Fainting. Seizures. Loss of consciousness (coma). Death. How is this treated? Treating low blood sugar Low blood sugar is often treated by eating or drinking something that has sugar in it right away. The food or drink should contain 15 grams of a fast-acting carb (carbohydrate). Options include: 4 oz (120 mL) of fruit juice. 4 oz (120 mL) of regular soda (not diet soda). A few pieces of hard candy. Check food labels to see how many pieces to eat for 15 grams. 1 Tbsp (15 mL) of sugar or honey. 4 glucose tablets. 1 tube of glucose gel. Treating low blood sugar if you have diabetes If you can think clearly and swallow safely, follow the 15:15 rule: Take 15 grams of a fast-acting carb. Talk with your doctor about how much you should take. Always keep a source of fast-acting carb with you, such as: Glucose tablets (take 4 tablets). A few pieces of hard candy. Check food labels to see how many pieces to eat for 15 grams. 4 oz (120 mL) of fruit juice. 4 oz (120 mL) of regular soda (not diet soda). 1 Tbsp (15 mL) of honey or sugar. 1 tube of glucose gel. Check your blood sugar 15 minutes after you take the carb. If your blood sugar is still at or below 70 mg/dL (3.9 mmol/L), take 15 grams of a carb again. If your blood sugar does not go above 70 mg/dL (3.9 mmol/L) after 3 tries, get help right away. After your blood sugar goes back to normal, eat a meal or a snack  within 1 hour.  Treating very low blood sugar If your blood sugar is below 54 mg/dL (3 mmol/L), you have very low blood sugar, or severe hypoglycemia. This is an emergency. Get medical help right away. If you have very low blood sugar and you cannot eat or drink, you will need to be given a hormone called glucagon. A family member or friend should learn how to check your blood sugar and how to give you glucagon. Ask your doctor if you need to have an emergency  glucagon kit at home. Very low blood sugar may also need to be treated in a hospital. Follow these instructions at home: General instructions Take over-the-counter and prescription medicines only as told by your doctor. Stay aware of your blood sugar as told by your doctor. If you drink alcohol: Limit how much you have to: 0-1 drink a day for women who are not pregnant. 0-2 drinks a day for men. Know how much alcohol is in your drink. In the [place] one drink equals one 12 oz bottle of beer (355 mL), one 5 oz glass of wine (148 mL), or one 1 oz glass of hard liquor (44 mL). Be sure to eat food when you drink alcohol. Know that your body absorbs alcohol quickly. This may lead to low blood sugar later. Be sure to keep checking your blood sugar. Keep all follow-up visits. If you have diabetes:  Always have a fast-acting carb (15 grams) with you to treat low blood sugar. Follow your diabetes care plan as told by your doctor. Make sure you: Know the symptoms of low blood sugar. Check your blood sugar as often as told. Always check it before and after exercise. Always check your blood sugar before you drive. Take your medicines as told. Follow your meal plan. Eat on time. Do not skip meals. Share your diabetes care plan with: Your work or school. People you live with. Carry a card or wear jewelry that says you have diabetes. Where to find more information American Diabetes Association: www.diabetes.org Contact a doctor if: You have trouble keeping your blood sugar in your target range. You have low blood sugar often. Get help right away if: You still have symptoms after you eat or drink something that contains 15 grams of fast-acting carb, and you cannot get your blood sugar above 70 mg/dL by following the 15:15 rule. Your blood sugar is below 54 mg/dL (3 mmol/L). You have a seizure. You faint. These symptoms may be an emergency. Get help right away. Call your local emergency services  (911 in the [place] Do not wait to see if the symptoms will go away. Do not drive yourself to the hospital. Summary Hypoglycemia happens when the level of sugar (glucose) in your blood is too low. Low blood sugar can happen to people who have diabetes and people who do not have diabetes. Low blood sugar can happen quickly, and it can be an emergency. Make sure you know the symptoms of low blood sugar and know how to treat it. Always keep a source of sugar (fast-acting carb) with you to treat low blood sugar. This information is not intended to replace advice given to you by your health care provider. Make sure you discuss any questions you have with your health care provider. Document Revised: 05/19/2020 Document Reviewed: 05/19/2020 Elsevier Patient Education  [place].

## 2022-06-20 NOTE — Progress Notes (Signed)
Subjective:  Patient ID: Daniel [name], male    DOB: 11-Mar-1951  Age: 71 y.o. MRN: [id number]  CC:  Chief Complaint  Patient presents with   Diabetes    Pt wants to get back on diabetes meds     HPI Daniel [name] presents for   Diabetes: With polyneuropathy, hyperglycemia, elevated creatinine recently, unfortunately have had to stop metformin and initially stopped [place].  Has been seen by nephrology.  Plan for review of their notes from last visit, but I have not received them.  He did report that nephrology wanted him to continue to avoid metformin.  Has continued to take glimepiride 4 mg twice daily but sugars have been increasing.  Mid 100s usually, had some 200s at his last visit.  Note reviewed from nephrology. AKI, likely multifactorial. Avoid nsaids, nephrotoxins, stopped HCTZ and PPI, off metformin and farxiga. Call if BP over 130, continued lisinopril. Lokelma if needed for elevated potassium. If persistent hyperkalemia,  option to stop lisinopril. Low K diet.  Home BP's upper 120's.  Taking famotidine - off PPI.  No recent nsaids, no recent diarrhea. Constipation is improving since last visit.  No hx of symptomatic lows typically.  Home blood sugars - 205-268 past week.  Saw nutritionist, some helpful hints.  Creat 1.69, K 5.4 on 06/06/22.  Microalbumin: Normal 11/30/2021 Optho, foot exam, pneumovax:  Ophthalmology exam: next few weeks.   Higher dose sertraline working well, rare use of albuterol for asthma outside of allergy season.  No n/v. Some increased thirst. No acute vision changes. No abd pain.  Lab Results  Component Value Date   HGBA1C 8.3 (H) 03/15/2022   HGBA1C 8.4 (A) 11/30/2021   HGBA1C 9.4 (H) 08/30/2021   Lab Results  Component Value Date   MICROALBUR 1.4 11/30/2021   LDLCALC 72 03/15/2022   CREATININE 1.87 (H) 06/01/2022   Lab Results  Component Value Date   K 5.3 No hemolysis seen (H) 06/01/2022     History Patient Active Problem List    Diagnosis Date Noted   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   Trigger finger, left little finger 02/04/2020   Carpal tunnel syndrome, left upper limb 11/24/2019   Unilateral primary osteoarthritis, left knee 11/02/2019   Unilateral primary osteoarthritis, right knee 11/02/2019   Carpal tunnel syndrome, right upper limb 02/18/2018   Anxiety and depression 10/28/2016   Gastroesophageal reflux disease without esophagitis 10/28/2016   Seasonal allergic rhinitis due to pollen 10/28/2016   Primary osteoarthritis of left knee 07/25/2015   Essential hypertension 07/25/2015   Hypercholesteremia 07/25/2015   Type 2 diabetes mellitus with diabetic polyneuropathy, without long-term current use of insulin ([place]) 07/25/2015   Pollen allergies 02/10/2014   Allergic rhinitis due to animal (cat) (dog) hair and dander 09/15/2012   Extrinsic asthma 09/13/2011   Proteinuria 11/24/2010   Past Medical History:  Diagnosis Date   Allergic rhinitis    Allergy    Anxiety    Arthritis    Benign hypertension    Cataract    Depression    Diabetes mellitus ([place])    type 2    GERD (gastroesophageal reflux disease)    History of pneumonia    HLD (hyperlipidemia)    Neuromuscular disorder ([place])    pt denies at 8/19/visit    Pneumonia    hx of several times per pt - last episode 4-5 years ago    Past Surgical History:  Procedure Laterality Date   CARPAL TUNNEL  RELEASE Bilateral 2020 Right, 2021 left   CATARACT EXTRACTION     bilateral   EYE SURGERY     KNEE SURGERY     right   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   Procedure: LEFT TOTAL KNEE ARTHROPLASTY;  Surgeon: Mcarthur Rossetti, MD;  Location: [facility];  Service: Orthopedics;  Laterality: Left;   Allergies  Allergen Reactions   Codeine Palpitations, Shortness Of Breath and Other (See Comments)   Pioglitazone Other (See Comments), Shortness Of Breath and Swelling    Blood sugar went up and down and sweating profusely    Prior to Admission medications   Medication Sig Start Date End Date Taking? Authorizing Provider  albuterol (VENTOLIN HFA) 108 (90 Base) MCG/ACT inhaler INHALE 2 PUFFS BY MOUTH EVERY 6 HOURS AS NEEDED FOR WHEEZING OR SHORTNESS OF BREATH 03/15/22  Yes Wendie Agreste, MD  aspirin EC 325 MG EC tablet Take 1 tablet (325 mg total) by mouth 2 (two) times daily after a meal. Patient taking differently: Take 81 mg by mouth 2 (two) times daily after a meal. 02/27/20  Yes Mcarthur Rossetti, MD  cetirizine (ZYRTEC) 10 MG tablet Take 10 mg by mouth daily as needed for allergies.   Yes [provider]  Cholecalciferol (VITAMIN D) 125 MCG (5000 UT) CAPS Take 5,000 Units by mouth daily.   Yes [provider]  Cyanocobalamin (VITAMIN B 12 PO) Take 1,000 mg by mouth daily.    Yes [provider]  ferrous sulfate 325 (65 FE) MG EC tablet Take 1 tablet (325 mg total) by mouth daily with breakfast. 10/05/21  Yes Armbruster, Carlota Raspberry, MD  fluticasone (FLONASE) 50 MCG/ACT nasal spray Place 1 spray into both nostrils daily.   Yes [provider]  gabapentin (NEURONTIN) 300 MG capsule TAKE 1 TO 2 CAPSULES BY MOUTH TWICE DAILY AS NEEDED 04/20/22  Yes Wendie Agreste, MD  glimepiride (AMARYL) 4 MG tablet Take 1 tablet (4 mg total) by mouth 2 (two) times daily. 06/01/22  Yes Wendie Agreste, MD  Lancets [facility] DELICA PLUS [id number]) MISC USE 1 TO CHECK GLUCOSE ONCE DAILY 08/04/18  Yes [provider]  lisinopril (ZESTRIL) 20 MG tablet Take 1 tablet (20 mg total) by mouth daily. 03/15/22  Yes Wendie Agreste, MD  montelukast (SINGULAIR) 10 MG tablet Take 1 tablet (10 mg total) by mouth at bedtime. 03/15/22  Yes Wendie Agreste, MD  Multiple Vitamin (MULTIVITAMIN WITH MINERALS) TABS tablet Take 1 tablet by mouth daily.   Yes [provider]  [facility] ULTRA test strip USE 1 STRIP TO CHECK GLUCOSE ONCE DAILY 11/25/18  Yes Wendie Agreste, MD  pravastatin  (PRAVACHOL) 80 MG tablet Take 1 tablet (80 mg total) by mouth daily. 08/30/21  Yes Wendie Agreste, MD  sertraline (ZOLOFT) 100 MG tablet 1 tablet daily. 03/15/22  Yes Wendie Agreste, MD  sertraline (ZOLOFT) 50 MG tablet Take 1 tablet (50 mg total) by mouth daily. Total dose 175m. 03/15/22  Yes GWendie Agreste MD  traMADol (ULTRAM) 50 MG tablet Take 1 tablet (50 mg total) by mouth every 6 (six) hours as needed. 06/13/22  Yes GWendie Agreste MD  vitamin E 180 MG (400 UNITS) capsule Take 400 Units by mouth daily.   Yes [provider]  cyclobenzaprine (FLEXERIL) 10 MG tablet Take 1 tablet (10 mg total) by mouth 3 (three) times daily as needed for muscle spasms. Patient not taking: Reported on 06/20/2022 10/26/20  Maximiano Coss, NP  dapagliflozin propanediol (FARXIGA) 10 MG TABS tablet Take 1 tablet (10 mg total) by mouth daily before breakfast. Patient not taking: Reported on 06/01/2022 03/21/22   Wendie Agreste, MD  diclofenac Sodium (VOLTAREN) 1 % GEL Apply 2 g topically daily. Patient not taking: Reported on 06/13/2022    [provider]  hydrochlorothiazide (MICROZIDE) 12.5 MG capsule Take 1 capsule (12.5 mg total) by mouth daily. Patient not taking: Reported on 06/13/2022 03/15/22   Wendie Agreste, MD  liraglutide (VICTOZA) 18 MG/3ML SOPN Inject 1.8 mg into the skin daily. Start 0.14m qd for 1 week, then 1.241mQD for 1 week, then 1.69m569mD. Patient not taking: Reported on 05/16/2022 08/30/21   GreWendie AgresteD  metFORMIN (GLUCOPHAGE) 1000 MG tablet Take 1 tablet (1,000 mg total) by mouth 2 (two) times daily with a meal. Patient not taking: Reported on 06/01/2022 03/15/22   GreWendie AgresteD  omeprazole (PRILOSEC) 20 MG capsule Take 1 capsule (20 mg total) by mouth daily. Patient not taking: Reported on 06/13/2022 03/15/22   GreWendie AgresteD  ondansetron (ZOFRAN ODT) 4 MG disintegrating tablet Take 1 tablet (4 mg total) by mouth every 8 (eight) hours as  needed for nausea or vomiting. Patient not taking: Reported on 03/15/2022 02/27/20   BlaMcarthur RossettiD  sodium chloride (OCEAN) 0.65 % SOLN nasal spray Place 1 spray into both nostrils as needed for congestion. Patient not taking: Reported on 03/15/2022    [provider]   Social History   Socioeconomic History   Marital status: Married    Spouse name: Not on file   Number of children: 3   Years of education: 16   Highest education level: Not on file  Occupational History   Occupation: tilDealerinCompany secretaryobacco Use   Smoking status: Never   Smokeless tobacco: Never  Vaping Use   Vaping Use: Never used  Substance and Sexual Activity   Alcohol use: No   Drug use: No   Sexual activity: Not Currently  Other Topics Concern   Not on file  Social History Narrative   Not on file   Social Determinants of Health   Financial Resource Strain: Medium Risk (02/15/2022)   Overall Financial Resource Strain (CARDIA)    Difficulty of Paying Living Expenses: Somewhat hard  Food Insecurity: No Food Insecurity (06/18/2022)   Hunger Vital Sign    Worried About Running Out of Food in the Last Year: Never true    Ran Out of Food in the Last Year: Never true  Transportation Needs: No Transportation Needs (02/15/2022)   PRAPARE - TraHydrologistedical): No    Lack of Transportation (Non-Medical): No  Physical Activity: Insufficiently Active (02/15/2022)   Exercise Vital Sign    Days of Exercise per Week: 3 days    Minutes of Exercise per Session: 20 min  Stress: No Stress Concern Present (02/15/2022)   [place] Feeling of Stress : Not at all  Social Connections: Moderately Integrated (02/15/2022)   Social Connection and Isolation Panel [NHANES]    Frequency of Communication with Friends and Family: Three times a week    Frequency of Social Gatherings with Friends and  Family: Three times a week    Attends Religious Services: More than 4 times per year    Active Member of Clubs or Organizations: No    Attends  Club or Organization Meetings: Never    Marital Status: Married  Human resources officer Violence: Not At Risk (02/15/2022)   Humiliation, Afraid, Rape, and Kick questionnaire    Fear of Current or Ex-Partner: No    Emotionally Abused: No    Physically Abused: No    Sexually Abused: No    Review of Systems Per HPI.   Objective:   Vitals:   06/20/22 0820  BP: 130/72  Pulse: (!) 58  Temp: 98.2 F (36.8 C)  SpO2: 99%  Weight: 233 lb 9.6 oz (106 kg)  Height: _0  (1.727 m)     Physical Exam Vitals reviewed.  Constitutional:      Appearance: He is well-developed.  HENT:     Head: Normocephalic and atraumatic.  Neck:     Vascular: No carotid bruit or JVD.  Cardiovascular:     Rate and Rhythm: Normal rate and regular rhythm.     Heart sounds: Normal heart sounds. No murmur heard. Pulmonary:     Effort: Pulmonary effort is normal.     Breath sounds: Normal breath sounds. No rales.  Musculoskeletal:     Right lower leg: No edema.     Left lower leg: No edema.  Skin:    General: Skin is warm and dry.  Neurological:     Mental Status: He is alert and oriented to person, place, and time.  Psychiatric:        Mood and Affect: Mood normal.        Assessment & Plan:  Daniel [name] is a 71 y.o. male . AKI (acute kidney injury) ([place]) - Plan: Basic metabolic panel  -Nephrology note reviewed as above, check updated BMP.  Continue avoid metformin, Farxiga, HCTZ at this time.  Blood pressure overall stable.  Plan to contact nephrology if med adjustments needed or elevated readings.  Continue to avoid other nephrotoxins and maintain hydration with recheck in 2 weeks.  Mild intermittent asthma without complication  -  Stable, tolerating current regimen.  Albuterol only if needed.  Type 2 diabetes mellitus with diabetic polyneuropathy,  without long-term current use of insulin ([facility]) - Plan: Basic metabolic panel, Hemoglobin A1c, insulin glargine (LANTUS SOLOSTAR) 100 UNIT/ML Solostar Pen, Insulin Pen Needle (PEN NEEDLES) 32G X 4 MM MISC  -Unfortunately readings have increased from the [place] as expected, unable to restart those at this time due to acute kidney injury.  Will continue glimepiride, start Lantus 5 units initially as he is on a sulfonylurea and risk of hypoglycemia was discussed.  Increase by 2 units every 2 to 3 days until readings below 200.  Update on readings in the next 1 week with 2-week follow-up in office.  Essential hypertension  -Borderline but stable on just lisinopril, continue same for now with monitoring of potassium and option to stop if persistent hyperkalemia  Hypercholesteremia  -Tolerating statin, continue same  Depression, unspecified depression type - Plan: sertraline (ZOLOFT) 50 MG tablet, sertraline (ZOLOFT) 100 MG tablet  -Overall stable at 150 mg dosing, continue same.  Meds ordered this encounter  Medications   insulin glargine (LANTUS SOLOSTAR) 100 UNIT/ML Solostar Pen    Sig: Inject 5 Units into the skin daily.    Dispense:  15 mL    Refill:  1   Insulin Pen Needle (PEN NEEDLES) 32G X 4 MM MISC    Sig: 1 Application by Does not apply route daily.    Dispense:  100 each    Refill:  3  sertraline (ZOLOFT) 50 MG tablet    Sig: Take 1 tablet (50 mg total) by mouth daily. Total dose 151m.    Dispense:  90 tablet    Refill:  1   sertraline (ZOLOFT) 100 MG tablet    Sig: 1 tablet daily.    Dispense:  90 tablet    Refill:  1   Patient Instructions  Start lantus, 5 units per day, increase by 2 units every 2-3 days until readings are under 200. Stay at that dose when under 200. Watch for low blood sugar symptoms ( see below) with using glimepiride as well. If that occurs, will need to lower doses. Send me an update in next week.   Recheck in 2 weeks.  Return to the clinic or go to  the nearest emergency room if any of your symptoms worsen or new symptoms occur.  Hypoglycemia Hypoglycemia is when the sugar (glucose) level in your blood is too low. Low blood sugar can happen to people who have diabetes and people who do not have diabetes. Low blood sugar can happen quickly, and it can be an emergency. What are the causes? This condition happens most often in people who have diabetes. It may be caused by: Diabetes medicine. Not eating enough, or not eating often enough. Doing more physical activity. Drinking alcohol on an empty stomach. If you do not have diabetes, this condition may be caused by: A tumor in the pancreas. Not eating enough, or not eating for long periods at a time (fasting). A very bad infection or illness. Problems after having weight loss (bariatric) surgery. Kidney failure or liver failure. Certain medicines. What increases the risk? This condition is more likely to develop in people who: Have diabetes and take medicines to lower their blood sugar. Abuse alcohol. Have a very bad illness. What are the signs or symptoms? Mild Hunger. Sweating and feeling clammy. Feeling dizzy or light-headed. Being sleepy or having trouble sleeping. Feeling like you may vomit (nauseous). A fast heartbeat. A headache. Blurry vision. Mood changes, such as: Being grouchy. Feeling worried or nervous (anxious). Tingling or loss of feeling (numbness) around your mouth, lips, or tongue. Moderate Confusion and poor judgment. Behavior changes. Weakness. Uneven heartbeat. Trouble with moving (coordination). Very low Very low blood sugar (severe hypoglycemia) is a medical emergency. It can cause: Fainting. Seizures. Loss of consciousness (coma). Death. How is this treated? Treating low blood sugar Low blood sugar is often treated by eating or drinking something that has sugar in it right away. The food or drink should contain 15 grams of a fast-acting carb  (carbohydrate). Options include: 4 oz (120 mL) of fruit juice. 4 oz (120 mL) of regular soda (not diet soda). A few pieces of hard candy. Check food labels to see how many pieces to eat for 15 grams. 1 Tbsp (15 mL) of sugar or honey. 4 glucose tablets. 1 tube of glucose gel. Treating low blood sugar if you have diabetes If you can think clearly and swallow safely, follow the 15:15 rule: Take 15 grams of a fast-acting carb. Talk with your doctor about how much you should take. Always keep a source of fast-acting carb with you, such as: Glucose tablets (take 4 tablets). A few pieces of hard candy. Check food labels to see how many pieces to eat for 15 grams. 4 oz (120 mL) of fruit juice. 4 oz (120 mL) of regular soda (not diet soda). 1 Tbsp (15 mL) of honey or sugar. 1 tube  of glucose gel. Check your blood sugar 15 minutes after you take the carb. If your blood sugar is still at or below 70 mg/dL (3.9 mmol/L), take 15 grams of a carb again. If your blood sugar does not go above 70 mg/dL (3.9 mmol/L) after 3 tries, get help right away. After your blood sugar goes back to normal, eat a meal or a snack within 1 hour.  Treating very low blood sugar If your blood sugar is below 54 mg/dL (3 mmol/L), you have very low blood sugar, or severe hypoglycemia. This is an emergency. Get medical help right away. If you have very low blood sugar and you cannot eat or drink, you will need to be given a hormone called glucagon. A family member or friend should learn how to check your blood sugar and how to give you glucagon. Ask your doctor if you need to have an emergency glucagon kit at home. Very low blood sugar may also need to be treated in a hospital. Follow these instructions at home: General instructions Take over-the-counter and prescription medicines only as told by your doctor. Stay aware of your blood sugar as told by your doctor. If you drink alcohol: Limit how much you have to: 0-1 drink a  day for women who are not pregnant. 0-2 drinks a day for men. Know how much alcohol is in your drink. In the [place] one drink equals one 12 oz bottle of beer (355 mL), one 5 oz glass of wine (148 mL), or one 1 oz glass of hard liquor (44 mL). Be sure to eat food when you drink alcohol. Know that your body absorbs alcohol quickly. This may lead to low blood sugar later. Be sure to keep checking your blood sugar. Keep all follow-up visits. If you have diabetes:  Always have a fast-acting carb (15 grams) with you to treat low blood sugar. Follow your diabetes care plan as told by your doctor. Make sure you: Know the symptoms of low blood sugar. Check your blood sugar as often as told. Always check it before and after exercise. Always check your blood sugar before you drive. Take your medicines as told. Follow your meal plan. Eat on time. Do not skip meals. Share your diabetes care plan with: Your work or school. People you live with. Carry a card or wear jewelry that says you have diabetes. Where to find more information American Diabetes Association: www.diabetes.org Contact a doctor if: You have trouble keeping your blood sugar in your target range. You have low blood sugar often. Get help right away if: You still have symptoms after you eat or drink something that contains 15 grams of fast-acting carb, and you cannot get your blood sugar above 70 mg/dL by following the 15:15 rule. Your blood sugar is below 54 mg/dL (3 mmol/L). You have a seizure. You faint. These symptoms may be an emergency. Get help right away. Call your local emergency services (911 in the [place] Do not wait to see if the symptoms will go away. Do not drive yourself to the hospital. Summary Hypoglycemia happens when the level of sugar (glucose) in your blood is too low. Low blood sugar can happen to people who have diabetes and people who do not have diabetes. Low blood sugar can happen quickly, and it can be an  emergency. Make sure you know the symptoms of low blood sugar and know how to treat it. Always keep a source of sugar (fast-acting carb) with you to treat  low blood sugar. This information is not intended to replace advice given to you by your health care provider. Make sure you discuss any questions you have with your health care provider. Document Revised: 05/19/2020 Document Reviewed: 05/19/2020 Elsevier Patient Education  2023 [place],   Merri Ray, MD [place] [facility], [facility] 06/20/22 9:17 AM

## 2022-07-05 ENCOUNTER — Ambulatory Visit (INDEPENDENT_AMBULATORY_CARE_PROVIDER_SITE_OTHER): Payer: PPO | Admitting: Neurology

## 2022-07-05 ENCOUNTER — Encounter: Payer: Self-pay | Admitting: Neurology

## 2022-07-05 VITALS — BP 147/75 | HR 67 | Ht 68.0 in | Wt 238.8 lb

## 2022-07-05 DIAGNOSIS — Z9189 Other specified personal risk factors, not elsewhere classified: Secondary | ICD-10-CM | POA: Diagnosis not present

## 2022-07-05 DIAGNOSIS — R0683 Snoring: Secondary | ICD-10-CM

## 2022-07-05 DIAGNOSIS — R351 Nocturia: Secondary | ICD-10-CM | POA: Diagnosis not present

## 2022-07-05 DIAGNOSIS — E669 Obesity, unspecified: Secondary | ICD-10-CM

## 2022-07-05 NOTE — Patient Instructions (Signed)

## 2022-07-05 NOTE — Progress Notes (Signed)
Subjective:    Patient ID: Daniel [name] is a 72 y.o. male.  HPI    Star Age, MD, PhD [facility] [address], [address] [place], [place] [address]  Dear Dr. Carlota Raspberry,  I saw your patient, Daniel [name], upon your kind request in my sleep clinic today for initial consultation of his sleep disorder, in particular, concern for underlying obstructive sleep apnea.  The patient is unaccompanied today.  As you know, Daniel [name] is a 72 year old male with an underlying medical history of arthritis, status post left total knee replacement and right knee surgery, carpal tunnel syndrome with bilateral surgeries, allergic rhinitis, anxiety, depression, diabetes, reflux disease, hyperlipidemia, and obesity, who reports snoring as well as nocturia.  His Epworth sleepiness score is 6 out of 24, fatigue severity score is 35 out of 63. I reviewed your office note from 05/16/2022.  His bedtime is generally between 10 and 11 and rise time around 7.  He drinks decaf coffee only.  He sees a kidney specialist.  He lives with his wife, they have 2 kids together and he has a son from his previous marriage.  They have no pets in the household.  He suffers from low back pain.  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  He does have a TV in the bedroom but typically does not on at night, sometimes he watches a sports game on TV.  He has nocturia on average twice per night.  Overall, he has made some changes to his sleeping habits and sleeps a little better.  He denies recurrent nocturnal or morning headaches.  He takes tramadol as needed.  He is not aware of any family history of sleep apnea.  His Past Medical History Is Significant For: Past Medical History:  Diagnosis Date   Allergic rhinitis    Allergy    Anxiety    Arthritis    Benign hypertension    Cataract    Depression    Diabetes mellitus ([place])    type 2    GERD (gastroesophageal reflux disease)     History of pneumonia    HLD (hyperlipidemia)    Neuromuscular disorder ([place])    pt denies at 8/19/visit    Pneumonia    hx of several times per pt - last episode 4-5 years ago     His Past Surgical History Is Significant For: Past Surgical History:  Procedure Laterality Date   CARPAL TUNNEL RELEASE Bilateral 2020 Right, 2021 left   CATARACT EXTRACTION     bilateral   EYE SURGERY     KNEE SURGERY     right   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   Procedure: LEFT TOTAL KNEE ARTHROPLASTY;  Surgeon: Mcarthur Rossetti, MD;  Location: [facility];  Service: Orthopedics;  Laterality: Left;    His Family History Is Significant For: Family History  Problem Relation Age of Onset   Diabetes Father    Colon polyps Father    Atrial fibrillation Mother    Prostate cancer Paternal Uncle    Cancer Sister    Esophageal cancer Neg Hx    Rectal cancer Neg Hx    Stomach cancer Neg Hx     His Social History Is Significant For: Social History   Socioeconomic History   Marital status: Married    Spouse name: Not on file   Number of children: 3   Years of education: 16   Highest education  level: Not on file  Occupational History   Occupation: Dealer, Company secretary  Tobacco Use   Smoking status: Never   Smokeless tobacco: Never  Vaping Use   Vaping Use: Never used  Substance and Sexual Activity   Alcohol use: No   Drug use: No   Sexual activity: Not Currently  Other Topics Concern   Not on file  Social History Narrative   Caffiene decaff tea and coffee, occ diet soda.   Social Determinants of Health   Financial Resource Strain: Medium Risk (02/15/2022)   Overall Financial Resource Strain (CARDIA)    Difficulty of Paying Living Expenses: Somewhat hard  Food Insecurity: No Food Insecurity (06/18/2022)   Hunger Vital Sign    Worried About Running Out of Food in the Last Year: Never true    Ran Out of Food in the Last Year: Never true  Transportation Needs: No  Transportation Needs (02/15/2022)   PRAPARE - Hydrologist (Medical): No    Lack of Transportation (Non-Medical): No  Physical Activity: Insufficiently Active (02/15/2022)   Exercise Vital Sign    Days of Exercise per Week: 3 days    Minutes of Exercise per Session: 20 min  Stress: No Stress Concern Present (02/15/2022)   [place]    Feeling of Stress : Not at all  Social Connections: Moderately Integrated (02/15/2022)   Social Connection and Isolation Panel [NHANES]    Frequency of Communication with Friends and Family: Three times a week    Frequency of Social Gatherings with Friends and Family: Three times a week    Attends Religious Services: More than 4 times per year    Active Member of Clubs or Organizations: No    Attends Archivist Meetings: Never    Marital Status: Married    His Allergies Are:  Allergies  Allergen Reactions   Codeine Palpitations, Shortness Of Breath and Other (See Comments)   Pioglitazone Other (See Comments), Shortness Of Breath and Swelling    Blood sugar went up and down and sweating profusely  :   His Current Medications Are:  Outpatient Encounter Medications as of 07/05/2022  Medication Sig   albuterol (VENTOLIN HFA) 108 (90 Base) MCG/ACT inhaler INHALE 2 PUFFS BY MOUTH EVERY 6 HOURS AS NEEDED FOR WHEEZING OR SHORTNESS OF BREATH   cetirizine (ZYRTEC) 10 MG tablet Take 10 mg by mouth daily as needed for allergies.   Cholecalciferol (VITAMIN D) 125 MCG (5000 UT) CAPS Take 5,000 Units by mouth daily.   Cyanocobalamin (VITAMIN B 12 PO) Take 1,000 mg by mouth daily.    ferrous sulfate 325 (65 FE) MG EC tablet Take 1 tablet (325 mg total) by mouth daily with breakfast.   fluticasone (FLONASE) 50 MCG/ACT nasal spray Place 1 spray into both nostrils daily.   gabapentin (NEURONTIN) 300 MG capsule TAKE 1 TO 2 CAPSULES BY MOUTH TWICE DAILY AS NEEDED    glimepiride (AMARYL) 4 MG tablet Take 1 tablet (4 mg total) by mouth 2 (two) times daily.   insulin glargine (LANTUS SOLOSTAR) 100 UNIT/ML Solostar Pen Inject 5 Units into the skin daily.   Insulin Pen Needle (PEN NEEDLES) 32G X 4 MM MISC 1 Application by Does not apply route daily.   Lancets (ONETOUCH DELICA PLUS MEQAST41D) MISC USE 1 TO CHECK GLUCOSE ONCE DAILY   lisinopril (ZESTRIL) 20 MG tablet Take 1 tablet (20 mg total) by mouth daily.   montelukast (SINGULAIR)  10 MG tablet Take 1 tablet (10 mg total) by mouth at bedtime.   Multiple Vitamin (MULTIVITAMIN WITH MINERALS) TABS tablet Take 1 tablet by mouth daily.   ondansetron (ZOFRAN ODT) 4 MG disintegrating tablet Take 1 tablet (4 mg total) by mouth every 8 (eight) hours as needed for nausea or vomiting.   ONETOUCH ULTRA test strip USE 1 STRIP TO CHECK GLUCOSE ONCE DAILY   pravastatin (PRAVACHOL) 80 MG tablet Take 1 tablet (80 mg total) by mouth daily.   sertraline (ZOLOFT) 100 MG tablet 1 tablet daily.   sertraline (ZOLOFT) 50 MG tablet Take 1 tablet (50 mg total) by mouth daily. Total dose '150mg'$ .   traMADol (ULTRAM) 50 MG tablet Take 1 tablet (50 mg total) by mouth every 6 (six) hours as needed.   vitamin E 180 MG (400 UNITS) capsule Take 400 Units by mouth daily.   aspirin EC 325 MG EC tablet Take 1 tablet (325 mg total) by mouth 2 (two) times daily after a meal. (Patient not taking: Reported on 07/05/2022)   No facility-administered encounter medications on file as of 07/05/2022.  :   Review of Systems:  Out of a complete 14 point review of systems, all are reviewed and negative with the exception of these symptoms as listed below:   Review of Systems  Neurological:        Waking up with nocturia 2 per night average, fatigue, daytime somnolence. He states he is some better.   ESS 6 FSS 35.     Objective:  Neurological Exam  Physical Exam Physical Examination:   Vitals:   07/05/22 0906  BP: (!) 147/75  Pulse: 67     General Examination: The patient is a very pleasant 72 y.o. male in no acute distress. He appears well-developed and well-nourished and well groomed.   HEENT: Normocephalic, atraumatic, pupils are equal, round and reactive to light, extraocular tracking is good without limitation to gaze excursion or nystagmus noted. Hearing is grossly intact. Face is symmetric with normal facial animation. Speech is clear with no dysarthria noted. There is no hypophonia. There is no lip, neck/head, jaw or voice tremor. Neck is supple with full range of passive and active motion. There are no carotid bruits on auscultation. Oropharynx exam reveals: No significant mouth dryness, adequate dental hygiene, moderate airway crowding secondary to small airway entry, slightly wider uvula, tonsillar size of about 1+ bilaterally.  Mallampati class II.  Neck circumference 18-5/8 inches, moderate overbite noted.  Tongue protrudes centrally and palate elevates symmetrically.  Chest: Clear to auscultation without wheezing, rhonchi or crackles noted.  Heart: S1+S2+0, regular and normal without murmurs, rubs or gallops noted.   Abdomen: Soft, non-tender and non-distended.  Extremities: There is 1+ pitting edema in the distal lower extremities bilaterally.   Skin: Warm and dry without trophic changes noted.   Musculoskeletal: exam reveals no obvious joint deformities.   Neurologically:  Mental status: The patient is awake, alert and oriented in all 4 spheres. His immediate and remote memory, attention, language skills and fund of knowledge are appropriate. There is no evidence of aphasia, agnosia, apraxia or anomia. Speech is clear with normal prosody and enunciation. Thought process is linear. Mood is normal and affect is normal.  Cranial nerves II - XII are as described above under HEENT exam.  Motor exam: Normal bulk, strength and tone is noted. There is no obvious action or resting tremor.  Fine motor skills and  coordination: grossly intact.  Cerebellar testing: No dysmetria  or intention tremor. There is no truncal or gait ataxia.  Sensory exam: intact to light touch in the upper and lower extremities.  Gait, station and balance: He stands easily. No veering to one side is noted. No leaning to one side is noted. Posture is age-appropriate and stance is narrow based. Gait shows normal stride length and normal pace. No problems turning are noted.   Assessment and Plan:  In summary, Daniel [name] is a very pleasant 72 y.o.-year old male with an underlying medical history of arthritis, status post left total knee replacement and right knee surgery, carpal tunnel syndrome with bilateral surgeries, allergic rhinitis, anxiety, depression, diabetes, reflux disease, hyperlipidemia, and obesity, whose history and physical exam are concerning for sleep disordered breathing, particularly obstructive sleep apnea. A laboratory attended sleep study is typically considered "gold standard" for evaluation of sleep disordered breathing.   I had a long chat with the patient about my findings and the diagnosis of sleep apnea, particularly OSA, its prognosis and treatment options. We talked about medical/conservative treatments, surgical interventions and non-pharmacological approaches for symptom control. I explained, in particular, the risks and ramifications of untreated moderate to severe OSA, especially with respect to developing cardiovascular disease down the road, including congestive heart failure (CHF), difficult to treat hypertension, cardiac arrhythmias (particularly A-fib), neurovascular complications including TIA, stroke and dementia. Even type 2 diabetes has, in part, been linked to untreated OSA. Symptoms of untreated OSA may include (but may not be limited to) daytime sleepiness, nocturia (i.e. frequent nighttime urination), memory problems, mood irritability and suboptimally controlled or worsening mood disorder such  as depression and/or anxiety, lack of energy, lack of motivation, physical discomfort, as well as recurrent headaches, especially morning or nocturnal headaches. We talked about the importance of maintaining a healthy lifestyle and striving for healthy weight. In addition, we talked about the importance of striving for and maintaining good sleep hygiene. I recommended a sleep study at this time. I outlined the differences between a laboratory attended sleep study which is considered more comprehensive and accurate over the option of a home sleep test (HST); the latter may lead to underestimation of sleep disordered breathing in some instances and does not help with diagnosing upper airway resistance syndrome and is not accurate enough to diagnose primary central sleep apnea typically. I outlined possible surgical and non-surgical treatment options of OSA, including the use of a positive airway pressure (PAP) device (i.e. CPAP, AutoPAP/APAP or BiPAP in certain circumstances), a custom-made dental device (aka oral appliance, which would require a referral to a specialist dentist or orthodontist typically, and is generally speaking not considered for patients with full dentures or edentulous state), upper airway surgical options, such as traditional UPPP (which is not considered a first-line treatment) or the Inspire device (hypoglossal nerve stimulator, which would involve a referral for consultation with an ENT surgeon, after careful selection, following inclusion criteria - also not first-line treatment). I explained the PAP treatment option to the patient in detail, as this is generally considered first-line treatment.  The patient indicated that he would be willing to try PAP therapy, if the need arises. I explained the importance of being compliant with PAP treatment, not only for insurance purposes but primarily to improve patient's symptoms symptoms, and for the patient's long term health benefit, including  to reduce His cardiovascular risks longer-term.    We will pick up our discussion about the next steps and treatment options after testing.  We will keep him posted as  to the test results by phone call and/or MyChart messaging where possible.  We will plan to follow-up in sleep clinic accordingly as well.  I answered all his questions today and the patient was in agreement.   I encouraged him to call with any interim questions, concerns, problems or updates or email [place] through [place].  Generally speaking, sleep test authorizations may take up to 2 weeks, sometimes less, sometimes longer, the patient is encouraged to get in touch with [place] if they do not hear back from the sleep lab staff directly within the next 2 weeks.  Thank you very much for allowing me to participate in the care of this nice patient. If I can be of any further assistance to you please do not hesitate to call me at [phone].  Sincerely,   Star Age, MD, PhD

## 2022-07-09 ENCOUNTER — Telehealth: Payer: Self-pay | Admitting: Neurology

## 2022-07-09 NOTE — Telephone Encounter (Signed)
HTA- NPSG pending faxed notes

## 2022-07-11 ENCOUNTER — Encounter: Payer: Self-pay | Admitting: Family Medicine

## 2022-07-11 ENCOUNTER — Ambulatory Visit (INDEPENDENT_AMBULATORY_CARE_PROVIDER_SITE_OTHER): Payer: PPO | Admitting: Family Medicine

## 2022-07-11 VITALS — BP 130/76 | HR 58 | Temp 98.0°F | Ht 68.0 in | Wt 238.0 lb

## 2022-07-11 DIAGNOSIS — E1142 Type 2 diabetes mellitus with diabetic polyneuropathy: Secondary | ICD-10-CM

## 2022-07-11 DIAGNOSIS — I10 Essential (primary) hypertension: Secondary | ICD-10-CM | POA: Diagnosis not present

## 2022-07-11 DIAGNOSIS — N179 Acute kidney failure, unspecified: Secondary | ICD-10-CM | POA: Diagnosis not present

## 2022-07-11 LAB — BASIC METABOLIC PANEL
BUN: 28 mg/dL — ABNORMAL HIGH (ref 6–23)
CO2: 28 mEq/L (ref 19–32)
Calcium: 10 mg/dL (ref 8.4–10.5)
Chloride: 103 mEq/L (ref 96–112)
Creatinine, Ser: 1.56 mg/dL — ABNORMAL HIGH (ref 0.40–1.50)
GFR: 43.78 mL/min — ABNORMAL LOW (ref >60.00)
Glucose, Bld: 153 mg/dL — ABNORMAL HIGH (ref 70–99)
Potassium: 4.9 mEq/L (ref 3.5–5.1)
Sodium: 138 mEq/L (ref 135–145)

## 2022-07-11 LAB — GLUCOSE, POCT (MANUAL RESULT ENTRY): POC Glucose: 162 mg/dl — AB (ref 70–99)

## 2022-07-11 MED ORDER — LANTUS SOLOSTAR 100 UNIT/ML ~~LOC~~ SOPN: 10.0000 [IU] | PEN_INJECTOR | Freq: Every day | SUBCUTANEOUS | 3 refills | Status: DC

## 2022-07-11 NOTE — Patient Instructions (Addendum)
Lantus 10 units per day for now. If any low blood sugars, decrease further. If you start seeing 200's, can increase by 2 units and let me know.  No other changes for now. Take care!

## 2022-07-11 NOTE — Progress Notes (Signed)
Subjective:  Patient ID: Daniel [name], male    DOB: 06/22/1951  Age: 72 y.o. MRN: [id number]  CC:  Chief Complaint  Patient presents with   Diabetes    Pt notes BG 85, 96, 100 pt notes he went up 20 units to get down then backed off from there, notes he is out of the lantus at this time     HPI Daniel [name] presents for   Diabetes: With polyneuropathy, hyperglycemia, elevated creatinine requiring cessation of metformin and initially stopping [place].  Follow-up from December 20 visit, has seen nephrology.  Recommended to continue to avoid metformin.  He was taking glimepiride 4 mg twice daily but increasing blood sugars at that time.  Acute kidney injury likely multifactorial, plan to avoid NSAIDs, nephrotoxins and stopped his HCTZ and PPI.  Option of Lokelma if needed for elevated potassium, and option to stop lisinopril if persistent hyperkalemia.  He was taking famotidine at his last visit, off PP and had not experienced recurrent diarrhea.  Had met with nutritionist With persistent elevated readings we started on Lantus 5 units initially as he was on sulfonylurea and risk of hypoglycemia discussed.  Plan to increase 2 units every 2 to 3 days until under 200.  Blood sugars have been lower recently at its above, 85, 96, 108.  Has been up to 20 units of Lantus, then backed off to 12 units as running out of meds - same dose past 5 days.  No symptomatic lows. Lowest 90 this morning fasting. Has eaten breakfast. 147 yesterday - thought to be related to meal/carbs.  Doing well with injections.  Still taking glimepiride '4mg'$  BID.  Still avoiding nsaids, no recent diarrhea.  Urinating normally.  Overall feeling ok.  Plans on HST for screening OSA.   Lab Results  Component Value Date   CREATININE 1.83 (H) 06/20/2022   Lab Results  Component Value Date   K 5.2 No hemolysis seen (H) 06/20/2022     Lab Results  Component Value Date   HGBA1C 8.8 (H) 06/20/2022   HGBA1C 8.3 (H)  03/15/2022   HGBA1C 8.4 (A) 11/30/2021   Lab Results  Component Value Date   MICROALBUR 1.4 11/30/2021   LDLCALC 72 03/15/2022   CREATININE 1.83 (H) 06/20/2022     History 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   Trigger finger, left little finger 02/04/2020   Carpal tunnel syndrome, left upper limb 11/24/2019   Unilateral primary osteoarthritis, left knee 11/02/2019   Unilateral primary osteoarthritis, right knee 11/02/2019   Carpal tunnel syndrome, right upper limb 02/18/2018   Anxiety and depression 10/28/2016   Gastroesophageal reflux disease without esophagitis 10/28/2016   Seasonal allergic rhinitis due to pollen 10/28/2016   Primary osteoarthritis of left knee 07/25/2015   Essential hypertension 07/25/2015   Hypercholesteremia 07/25/2015   Type 2 diabetes mellitus with diabetic polyneuropathy, without long-term current use of insulin ([place]) 07/25/2015   Pollen allergies 02/10/2014   Allergic rhinitis due to animal (cat) (dog) hair and dander 09/15/2012   Extrinsic asthma 09/13/2011   Proteinuria 11/24/2010   Past Medical History:  Diagnosis Date   Allergic rhinitis    Allergy    Anxiety    Arthritis    Benign hypertension    Cataract    Depression    Diabetes mellitus ([place])    type 2    GERD (gastroesophageal reflux disease)    History of  pneumonia    HLD (hyperlipidemia)    Neuromuscular disorder ([place])    pt denies at 8/19/visit    Pneumonia    hx of several times per pt - last episode 4-5 years ago    Past Surgical History:  Procedure Laterality Date   CARPAL TUNNEL RELEASE Bilateral 2020 Right, 2021 left   CATARACT EXTRACTION     bilateral   EYE SURGERY     KNEE SURGERY     right   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   Procedure: LEFT TOTAL KNEE ARTHROPLASTY;  Surgeon: Mcarthur Rossetti, MD;  Location: [facility];  Service: Orthopedics;  Laterality: Left;   Allergies  Allergen  Reactions   Codeine Palpitations, Shortness Of Breath and Other (See Comments)   Pioglitazone Other (See Comments), Shortness Of Breath and Swelling    Blood sugar went up and down and sweating profusely   Prior to Admission medications   Medication Sig Start Date End Date Taking? Authorizing Provider  albuterol (VENTOLIN HFA) 108 (90 Base) MCG/ACT inhaler INHALE 2 PUFFS BY MOUTH EVERY 6 HOURS AS NEEDED FOR WHEEZING OR SHORTNESS OF BREATH 03/15/22  Yes Wendie Agreste, MD  cetirizine (ZYRTEC) 10 MG tablet Take 10 mg by mouth daily as needed for allergies.   Yes [provider]  Cholecalciferol (VITAMIN D) 125 MCG (5000 UT) CAPS Take 5,000 Units by mouth daily.   Yes [provider]  Cyanocobalamin (VITAMIN B 12 PO) Take 1,000 mg by mouth daily.    Yes [provider]  ferrous sulfate 325 (65 FE) MG EC tablet Take 1 tablet (325 mg total) by mouth daily with breakfast. 10/05/21  Yes Armbruster, Carlota Raspberry, MD  fluticasone (FLONASE) 50 MCG/ACT nasal spray Place 1 spray into both nostrils daily.   Yes [provider]  gabapentin (NEURONTIN) 300 MG capsule TAKE 1 TO 2 CAPSULES BY MOUTH TWICE DAILY AS NEEDED 04/20/22  Yes Wendie Agreste, MD  glimepiride (AMARYL) 4 MG tablet Take 1 tablet (4 mg total) by mouth 2 (two) times daily. 06/01/22  Yes Wendie Agreste, MD  insulin glargine (LANTUS SOLOSTAR) 100 UNIT/ML Solostar Pen Inject 5 Units into the skin daily. 06/20/22  Yes Wendie Agreste, MD  Insulin Pen Needle (PEN NEEDLES) 32G X 4 MM MISC 1 Application by Does not apply route daily. 06/20/22  Yes Wendie Agreste, MD  Lancets [facility] DELICA PLUS [id number]) MISC USE 1 TO CHECK GLUCOSE ONCE DAILY 08/04/18  Yes [provider]  lisinopril (ZESTRIL) 20 MG tablet Take 1 tablet (20 mg total) by mouth daily. 03/15/22  Yes Wendie Agreste, MD  montelukast (SINGULAIR) 10 MG tablet Take 1 tablet (10 mg total) by mouth at bedtime. 03/15/22  Yes Wendie Agreste,  MD  Multiple Vitamin (MULTIVITAMIN WITH MINERALS) TABS tablet Take 1 tablet by mouth daily.   Yes [provider]  ondansetron (ZOFRAN ODT) 4 MG disintegrating tablet Take 1 tablet (4 mg total) by mouth every 8 (eight) hours as needed for nausea or vomiting. 02/27/20  Yes Mcarthur Rossetti, MD  [facility] ULTRA test strip USE 1 STRIP TO CHECK GLUCOSE ONCE DAILY 11/25/18  Yes Wendie Agreste, MD  pravastatin (PRAVACHOL) 80 MG tablet Take 1 tablet (80 mg total) by mouth daily. 08/30/21  Yes Wendie Agreste, MD  sertraline (ZOLOFT) 100 MG tablet 1 tablet daily. 06/20/22  Yes Wendie Agreste, MD  sertraline (ZOLOFT) 50 MG tablet Take 1 tablet (50 mg total) by mouth  daily. Total dose '150mg'$ . 06/20/22  Yes Wendie Agreste, MD  traMADol (ULTRAM) 50 MG tablet Take 1 tablet (50 mg total) by mouth every 6 (six) hours as needed. 06/13/22  Yes Wendie Agreste, MD  vitamin E 180 MG (400 UNITS) capsule Take 400 Units by mouth daily.   Yes [provider]  aspirin EC 325 MG EC tablet Take 1 tablet (325 mg total) by mouth 2 (two) times daily after a meal. Patient not taking: Reported on 07/05/2022 02/27/20   Mcarthur Rossetti, MD   Social History   Socioeconomic History   Marital status: Married    Spouse name: Not on file   Number of children: 3   Years of education: 16   Highest education level: Not on file  Occupational History   Occupation: Dealer, Company secretary  Tobacco Use   Smoking status: Never   Smokeless tobacco: Never  Vaping Use   Vaping Use: Never used  Substance and Sexual Activity   Alcohol use: No   Drug use: No   Sexual activity: Not Currently  Other Topics Concern   Not on file  Social History Narrative   Caffiene decaff tea and coffee, occ diet soda.   Social Determinants of Health   Financial Resource Strain: Medium Risk (02/15/2022)   Overall Financial Resource Strain (CARDIA)    Difficulty of Paying Living Expenses: Somewhat hard  Food  Insecurity: No Food Insecurity (06/18/2022)   Hunger Vital Sign    Worried About Running Out of Food in the Last Year: Never true    Ran Out of Food in the Last Year: Never true  Transportation Needs: No Transportation Needs (02/15/2022)   PRAPARE - Hydrologist (Medical): No    Lack of Transportation (Non-Medical): No  Physical Activity: Insufficiently Active (02/15/2022)   Exercise Vital Sign    Days of Exercise per Week: 3 days    Minutes of Exercise per Session: 20 min  Stress: No Stress Concern Present (02/15/2022)   [place]    Feeling of Stress : Not at all  Social Connections: Moderately Integrated (02/15/2022)   Social Connection and Isolation Panel [NHANES]    Frequency of Communication with Friends and Family: Three times a week    Frequency of Social Gatherings with Friends and Family: Three times a week    Attends Religious Services: More than 4 times per year    Active Member of Clubs or Organizations: No    Attends Archivist Meetings: Never    Marital Status: Married  Human resources officer Violence: Not At Risk (02/15/2022)   Humiliation, Afraid, Rape, and Kick questionnaire    Fear of Current or Ex-Partner: No    Emotionally Abused: No    Physically Abused: No    Sexually Abused: No    Review of Systems Per HPI.   Objective:   Vitals:   07/11/22 0936  BP: 130/76  Pulse: (!) 58  Temp: 98 F (36.7 C)  TempSrc: Oral  SpO2: 97%  Weight: 238 lb (108 kg)  Height: '5\' 8"'$  (1.727 m)     Physical Exam Vitals reviewed.  Constitutional:      Appearance: He is well-developed.  HENT:     Head: Normocephalic and atraumatic.  Neck:     Vascular: No carotid bruit or JVD.  Cardiovascular:     Rate and Rhythm: Normal rate and regular rhythm.     Heart  sounds: Normal heart sounds. No murmur heard. Pulmonary:     Effort: Pulmonary effort is normal.     Breath  sounds: Normal breath sounds. No rales.  Musculoskeletal:     Right lower leg: No edema.     Left lower leg: No edema.  Skin:    General: Skin is warm and dry.  Neurological:     Mental Status: He is alert and oriented to person, place, and time.  Psychiatric:        Mood and Affect: Mood normal.    Results for orders placed or performed in visit on 07/11/22  POCT glucose (manual entry)  Result Value Ref Range   POC Glucose 162 (A) 70 - 99 mg/dl    Assessment & Plan:  Daniel [name] is a 72 y.o. male . Type 2 diabetes mellitus with diabetic polyneuropathy, without long-term current use of insulin ([place]) - Plan: POCT glucose (manual entry), insulin glargine (LANTUS SOLOSTAR) 100 UNIT/ML Solostar Pen  -Significant improved glycemic control at home on higher doses of Lantus but with lower readings will cut back to 10 units for now with option of small adjustments.  Denies true hypoglycemia.  Continue glimepiride same dose for now with RTC/hypoglycemia precautions.  6-week follow-up.  Essential hypertension  -Stable on current regimen, continue same  AKI (acute kidney injury) ([place]) - Plan: Basic metabolic panel  -Repeat BMP with improved renal function and potassium.  No changes for now.  Meds ordered this encounter  Medications   insulin glargine (LANTUS SOLOSTAR) 100 UNIT/ML Solostar Pen    Sig: Inject 10 Units into the skin daily.    Dispense:  15 mL    Refill:  3   Patient Instructions  Lantus 10 units per day for now. If any low blood sugars, decrease further. If you start seeing 200's, can increase by 2 units and let me know.  No other changes for now. Take care!        Signed,   Merri Ray, MD [place] 07/11/22 10:42 AM

## 2022-07-12 NOTE — Telephone Encounter (Signed)
Called HTA to check the status it is in nurse review.

## 2022-07-26 NOTE — Telephone Encounter (Signed)
HTA Daniel [name]: [id number] (exp. 07/09/22 to 10/07/22)   Patient prefers the HST.  HST- HTA pending faxed notes.  Patient is scheduled at [facility] for 08/21/22 at 9 AM.  Mailed packet to the patient.

## 2022-07-31 ENCOUNTER — Other Ambulatory Visit: Payer: Self-pay | Admitting: Family Medicine

## 2022-07-31 DIAGNOSIS — G8929 Other chronic pain: Secondary | ICD-10-CM

## 2022-07-31 NOTE — Telephone Encounter (Signed)
Tramadol 50 mg LOV: 07/11/22 Last Refill:13/31/23 Upcoming appt: no apt

## 2022-07-31 NOTE — Telephone Encounter (Signed)
Medication discussed at his September 14 visit.  2 to 3 pills/day.  Controlled substance database reviewed.  Last filled #120 on 06/13/2022, previously 04/06/2022.  Refill ordered.

## 2022-08-04 IMAGING — DX DG KNEE 1-2V PORT*L*
2 series · 2 of 2 positions shown · non-contrast
Comparison: 11/02/2019

CLINICAL DATA: Status post left knee replacement

EXAM:
PORTABLE LEFT KNEE - 2 VIEW

[knee ap]
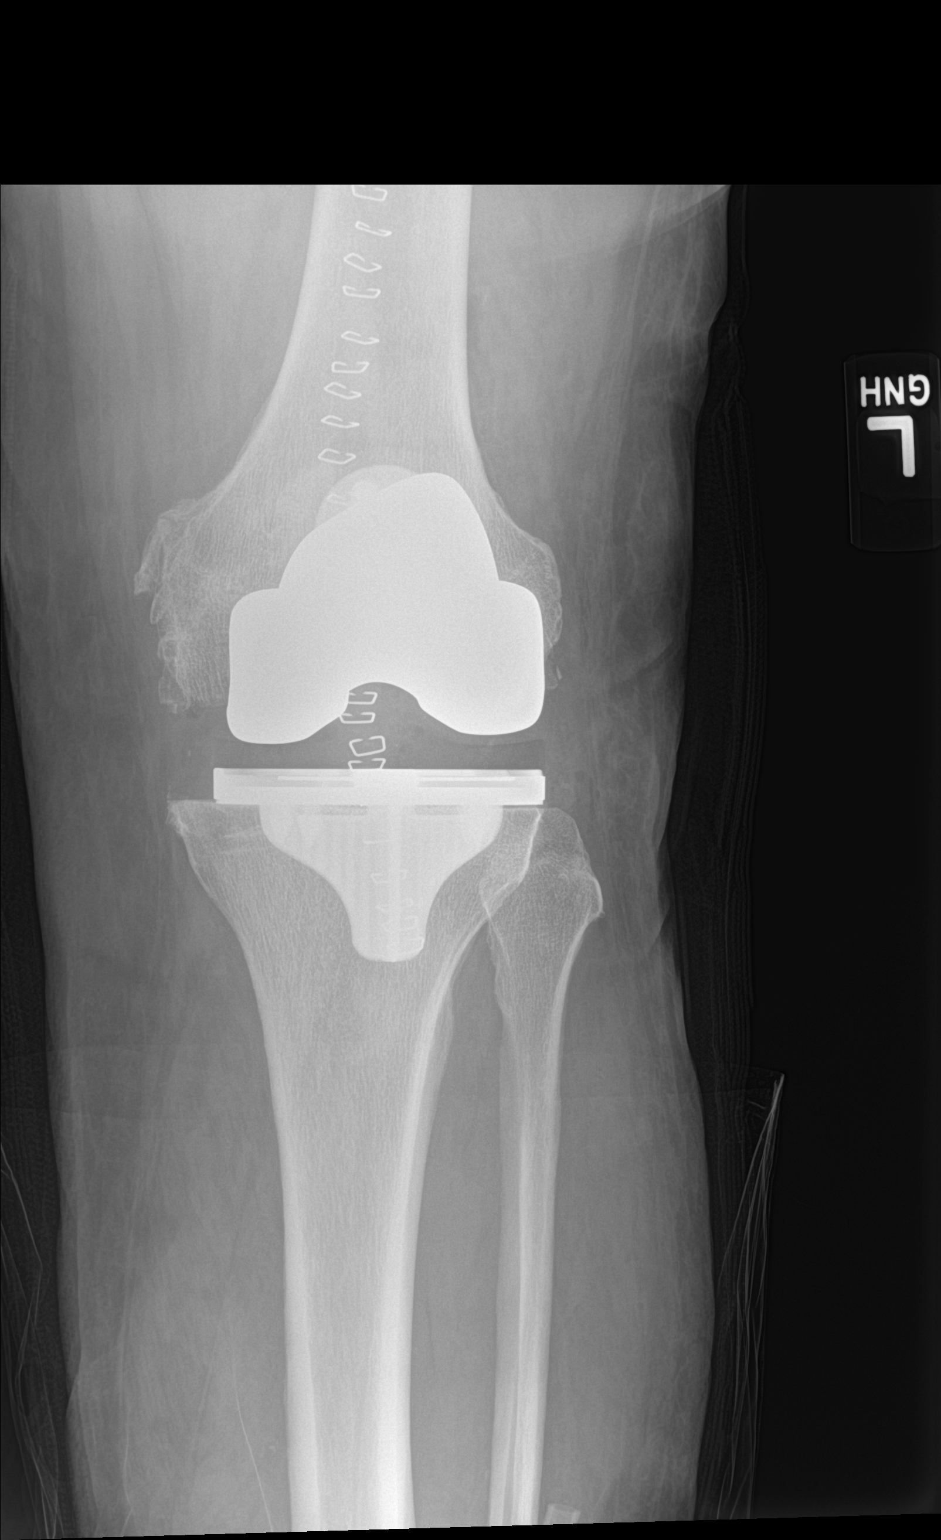

[knee lat]
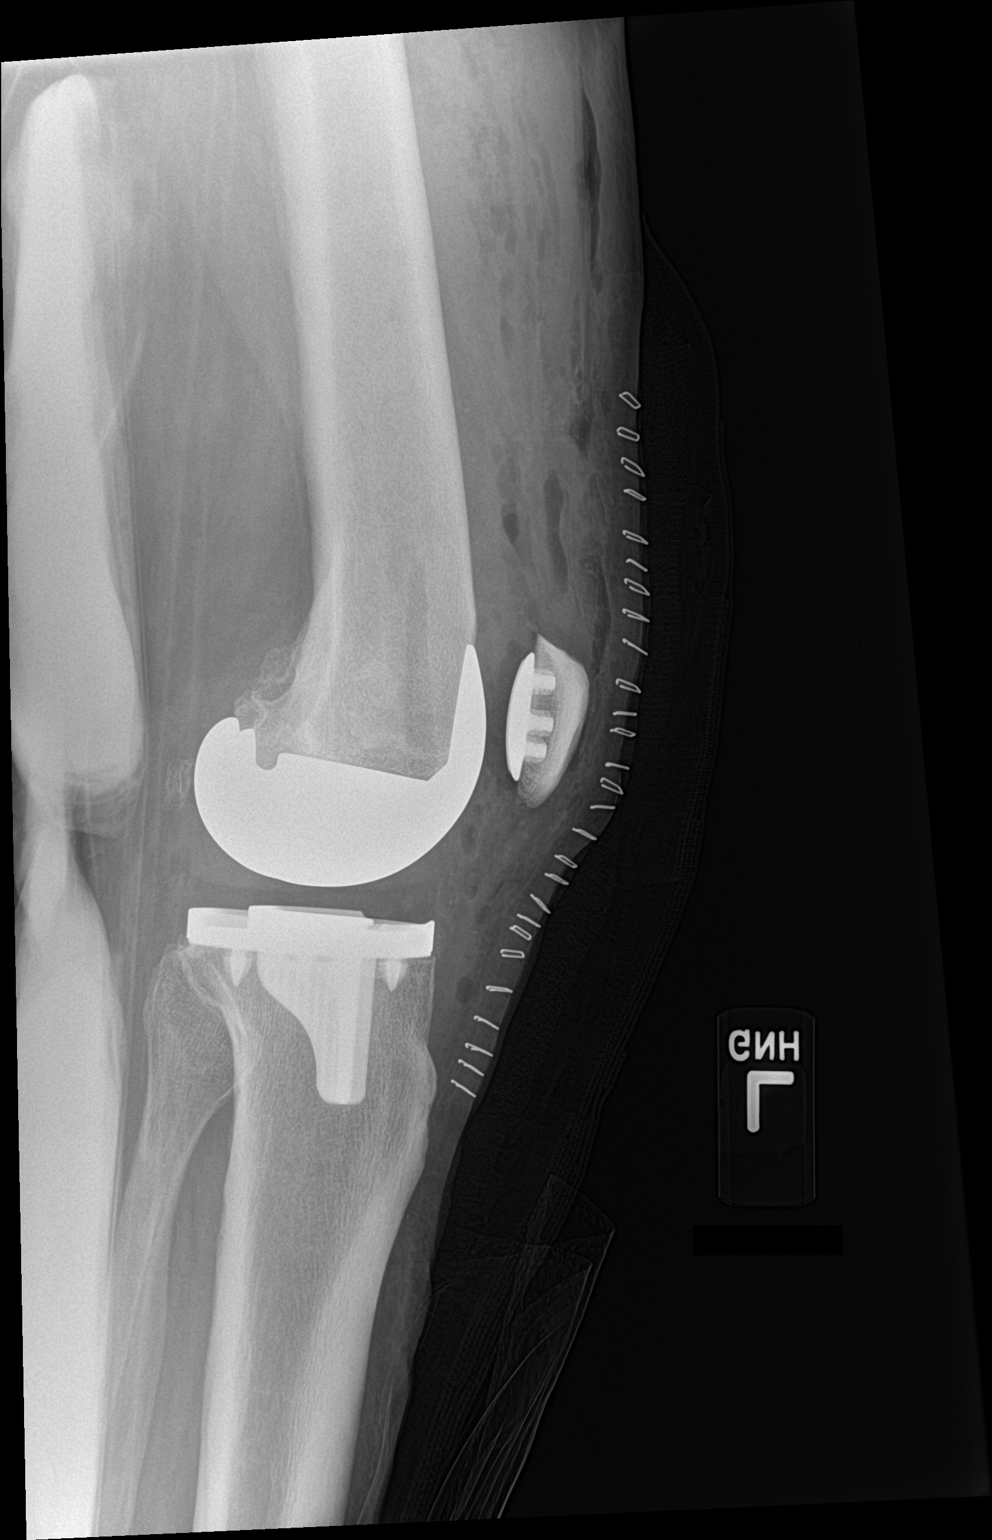

[2 of 2 positions shown; findings below may reference images not displayed]

FINDINGS: Postsurgical changes are noted. No acute fracture or dislocation is
noted. No soft tissue changes are seen.
IMPRESSION: Status post left knee replacement

## 2022-08-07 NOTE — Telephone Encounter (Signed)
Updated insurance auth:  HST- HTA auth: [id number] (exp. 07/26/22 to 10/24/22) pt prefers the HST   Patient is scheduled at [facility] for 08/21/22 at  9 AM.

## 2022-08-09 ENCOUNTER — Encounter: Payer: Self-pay | Admitting: Family Medicine

## 2022-08-09 DIAGNOSIS — G8929 Other chronic pain: Secondary | ICD-10-CM

## 2022-08-10 ENCOUNTER — Telehealth: Payer: Self-pay | Admitting: Family Medicine

## 2022-08-10 MED ORDER — TRAMADOL HCL 50 MG PO TABS: 50.0000 mg | ORAL_TABLET | Freq: Four times a day (QID) | ORAL | 0 refills | Status: DC | PRN

## 2022-08-10 NOTE — Telephone Encounter (Addendum)
Encourage patient to contact the pharmacy for refills or they can request refills through [facility]  (Please schedule appointment if patient has not been seen in over a year) Last ov was on 07/31/22   WHAT PHARMACY WOULD THEY LIKE THIS SENT TO: [facility] [address] [place] - [place] [address]   MEDICATION NAME & DOSE:traMADol (ULTRAM) 50 MG tablet   NOTES/COMMENTS FROM PATIENT: pt needs a refill on medication. Pt only has two pills left. Patient can't sleep at night.      [place] office please notify patient: It takes 48-72 hours to process rx refill requests Ask patient to call pharmacy to ensure rx is ready before heading there.

## 2022-08-10 NOTE — Telephone Encounter (Signed)
Called patient and let him know that he should have one at the pharmacy because it was sent in on 07/31/22. He stated that he would call them

## 2022-08-10 NOTE — Telephone Encounter (Signed)
Controlled substance database reviewed.  Last filled on December 13.  I ordered a refill of 120 on January 30 but it does not appear that has been picked up.  Can we clarify if his pharmacy has it or if a new prescription is needed? Thanks.

## 2022-08-10 NOTE — Telephone Encounter (Signed)
Called pharmacy and verified they do not have anything from 07/31/22 please re send

## 2022-08-10 NOTE — Telephone Encounter (Signed)
Last refill was 07/31/22 for 120 tablets, pt is asking for a refill till his appt the 21st  Please advise

## 2022-08-10 NOTE — Telephone Encounter (Signed)
Resent and advised patient.

## 2022-08-17 ENCOUNTER — Telehealth: Payer: Self-pay | Admitting: Family Medicine

## 2022-08-17 NOTE — Telephone Encounter (Signed)
Returned Daniel [name]'s call and confirmed dx of diabetes

## 2022-08-17 NOTE — Telephone Encounter (Signed)
Daniel [name] with HTA is calling in to see if she can get a confirmation that the pt had Congestive Heart Failure or Diabetes to be considered for this program.  She would like to have a call back.

## 2022-08-22 ENCOUNTER — Ambulatory Visit (INDEPENDENT_AMBULATORY_CARE_PROVIDER_SITE_OTHER): Payer: PPO | Admitting: Family Medicine

## 2022-08-22 ENCOUNTER — Encounter: Payer: Self-pay | Admitting: Family Medicine

## 2022-08-22 VITALS — BP 128/60 | HR 62 | Temp 98.8°F | Ht 68.0 in | Wt 240.8 lb

## 2022-08-22 DIAGNOSIS — N179 Acute kidney failure, unspecified: Secondary | ICD-10-CM | POA: Diagnosis not present

## 2022-08-22 DIAGNOSIS — E1142 Type 2 diabetes mellitus with diabetic polyneuropathy: Secondary | ICD-10-CM | POA: Diagnosis not present

## 2022-08-22 DIAGNOSIS — Z794 Long term (current) use of insulin: Secondary | ICD-10-CM | POA: Diagnosis not present

## 2022-08-22 DIAGNOSIS — R350 Frequency of micturition: Secondary | ICD-10-CM

## 2022-08-22 DIAGNOSIS — G2581 Restless legs syndrome: Secondary | ICD-10-CM | POA: Diagnosis not present

## 2022-08-22 LAB — BASIC METABOLIC PANEL
BUN: 30 mg/dL — ABNORMAL HIGH (ref 6–23)
CO2: 27 mEq/L (ref 19–32)
Calcium: 9.3 mg/dL (ref 8.4–10.5)
Chloride: 102 mEq/L (ref 96–112)
Creatinine, Ser: 1.55 mg/dL — ABNORMAL HIGH (ref 0.40–1.50)
GFR: 44.08 mL/min — ABNORMAL LOW (ref >60.00)
Glucose, Bld: 137 mg/dL — ABNORMAL HIGH (ref 70–99)
Potassium: 5.1 mEq/L (ref 3.5–5.1)
Sodium: 139 mEq/L (ref 135–145)

## 2022-08-22 LAB — POCT URINALYSIS DIP (MANUAL ENTRY)
Bilirubin, UA: NEGATIVE
Blood, UA: NEGATIVE
Glucose, UA: NEGATIVE mg/dL
Ketones, POC UA: NEGATIVE mg/dL
Leukocytes, UA: NEGATIVE
Nitrite, UA: NEGATIVE
Protein Ur, POC: NEGATIVE mg/dL
Spec Grav, UA: 1.025 (ref 1.010–1.025)
Urobilinogen, UA: NEGATIVE E.U./dL — AB
pH, UA: 6 (ref 5.0–8.0)

## 2022-08-22 LAB — PSA: PSA: 0.24 ng/mL (ref 0.10–4.00)

## 2022-08-22 MED ORDER — GABAPENTIN 300 MG PO CAPS: ORAL_CAPSULE | ORAL | 0 refills | Status: DC

## 2022-08-22 NOTE — Progress Notes (Signed)
Subjective:  Patient ID: Daniel [name], male    DOB: 1950-07-06  Age: 72 y.o. MRN: [id number]  CC:  Chief Complaint  Patient presents with   Diabetes    Adjusted lantus to 10 units 6 week check now at 14 units, pt notes doing well this am 125, has had some readings up and down 80-140 notes eating definitely contributed, pt notes potassium and diet is difficult  Pt asking for labs     HPI DORR RASSO presents for   Diabetes: With polyneuropathy, hyperglycemia, elevated creatinine.  Started on Lantus insulin in December. Improved glycemic control at home on higher dose of Lantus, we cut back to 10 units at his last visit due to some lower readings without true hypoglycemia.  Continued on glimepiride same dosing -4 mg twice daily..  Metformin avoided due to elevated creatinine, nephrology recommendation.  Prior AKI likely multifactorial, but plan to avoid NSAIDs, nephrotoxins and stopped HCTZ, PPI. Prior hyperkalemia resolved and improved creatinine at January 10 visit.  1.56 with EGFR 43.  Lantus dose: has increased back to 14 units.  Home readings fasting - low 100's Postprandial mid to high 100's.  Symptomatic lows - none. Lowest 85 - felt ok.    Lab Results  Component Value Date   HGBA1C 8.8 (H) 06/20/2022   HGBA1C 8.3 (H) 03/15/2022   HGBA1C 8.4 (A) 11/30/2021   Lab Results  Component Value Date   MICROALBUR 1.4 11/30/2021   LDLCALC 72 03/15/2022   CREATININE 1.56 (H) 07/11/2022   Urinary frequency: Frequent urination, no burning. For years, more frequent with smaller quantity at times. Nocturia 2-4 times per night. Sleep study next week.  No hematuria.  Lab Results  Component Value Date   PSA1 0.3 09/13/2016   PSA 0.21 07/25/2015   On gabapentin for neuropathy - 600mg  BID, RLS. Knee pain has been improved with less work. Tramadol 2 per day to sleep - pain in legs and RLS. Avoiding nsaids. Tramadol #28 filled 08/10/22. 2 per day. Has some left.   History Patient  Active Problem List   Diagnosis Date Noted   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   Trigger finger, left little finger 02/04/2020   Carpal tunnel syndrome, left upper limb 11/24/2019   Unilateral primary osteoarthritis, left knee 11/02/2019   Unilateral primary osteoarthritis, right knee 11/02/2019   Carpal tunnel syndrome, right upper limb 02/18/2018   Anxiety and depression 10/28/2016   Gastroesophageal reflux disease without esophagitis 10/28/2016   Seasonal allergic rhinitis due to pollen 10/28/2016   Primary osteoarthritis of left knee 07/25/2015   Essential hypertension 07/25/2015   Hypercholesteremia 07/25/2015   Type 2 diabetes mellitus with diabetic polyneuropathy, without long-term current use of insulin (HCC) 07/25/2015   Pollen allergies 02/10/2014   Allergic rhinitis due to animal (cat) (dog) hair and dander 09/15/2012   Extrinsic asthma 09/13/2011   Proteinuria 11/24/2010   Past Medical History:  Diagnosis Date   Allergic rhinitis    Allergy    Anxiety    Arthritis    Benign hypertension    Cataract    Depression    Diabetes mellitus (HCC)    type 2    GERD (gastroesophageal reflux disease)    History of pneumonia    HLD (hyperlipidemia)    Neuromuscular disorder (HCC)    pt denies at 8/19/visit    Pneumonia    hx of several times per pt - last episode 4-5 years ago    Past  Surgical History:  Procedure Laterality Date   CARPAL TUNNEL RELEASE Bilateral 2020 Right, 2021 left   CATARACT EXTRACTION     bilateral   EYE SURGERY     KNEE SURGERY     right   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   Procedure: LEFT TOTAL KNEE ARTHROPLASTY;  Surgeon: Kathryne Hitch, MD;  Location: [facility];  Service: Orthopedics;  Laterality: Left;   Allergies  Allergen Reactions   Codeine Palpitations, Shortness Of Breath and Other (See Comments)   Pioglitazone Other (See Comments), Shortness Of Breath and Swelling    Blood sugar went up and down and  sweating profusely   Prior to Admission medications   Medication Sig Start Date End Date Taking? Authorizing Provider  albuterol (VENTOLIN HFA) 108 (90 Base) MCG/ACT inhaler INHALE 2 PUFFS BY MOUTH EVERY 6 HOURS AS NEEDED FOR WHEEZING OR SHORTNESS OF BREATH 03/15/22  Yes Shade Flood, MD  cetirizine (ZYRTEC) 10 MG tablet Take 10 mg by mouth daily as needed for allergies.   Yes [provider]  Cholecalciferol (VITAMIN D) 125 MCG (5000 UT) CAPS Take 5,000 Units by mouth daily.   Yes [provider]  Cyanocobalamin (VITAMIN B 12 PO) Take 1,000 mg by mouth daily.    Yes [provider]  ferrous sulfate 325 (65 FE) MG EC tablet Take 1 tablet (325 mg total) by mouth daily with breakfast. 10/05/21  Yes Armbruster, Willaim Rayas, MD  fluticasone (FLONASE) 50 MCG/ACT nasal spray Place 1 spray into both nostrils daily.   Yes [provider]  gabapentin (NEURONTIN) 300 MG capsule TAKE 1 TO 2 CAPSULES BY MOUTH TWICE DAILY AS NEEDED 04/20/22  Yes Shade Flood, MD  glimepiride (AMARYL) 4 MG tablet Take 1 tablet (4 mg total) by mouth 2 (two) times daily. 06/01/22  Yes Shade Flood, MD  insulin glargine (LANTUS SOLOSTAR) 100 UNIT/ML Solostar Pen Inject 10 Units into the skin daily. Patient taking differently: Inject 14 Units into the skin daily. 07/11/22  Yes Shade Flood, MD  Insulin Pen Needle (PEN NEEDLES) 32G X 4 MM MISC 1 Application by Does not apply route daily. 06/20/22  Yes Shade Flood, MD  Lancets [facility] DELICA PLUS LANCET33G) MISC USE 1 TO CHECK GLUCOSE ONCE DAILY 08/04/18  Yes [provider]  lisinopril (ZESTRIL) 20 MG tablet Take 1 tablet (20 mg total) by mouth daily. 03/15/22  Yes Shade Flood, MD  montelukast (SINGULAIR) 10 MG tablet Take 1 tablet (10 mg total) by mouth at bedtime. 03/15/22  Yes Shade Flood, MD  Multiple Vitamin (MULTIVITAMIN WITH MINERALS) TABS tablet Take 1 tablet by mouth daily.   Yes [provider]  ondansetron (ZOFRAN ODT) 4 MG disintegrating tablet Take 1 tablet (4 mg total) by mouth every 8 (eight) hours as needed for nausea or vomiting. 02/27/20  Yes Kathryne Hitch, MD  [facility] ULTRA test strip USE 1 STRIP TO CHECK GLUCOSE ONCE DAILY 11/25/18  Yes Shade Flood, MD  pravastatin (PRAVACHOL) 80 MG tablet Take 1 tablet (80 mg total) by mouth daily. 08/30/21  Yes Shade Flood, MD  sertraline (ZOLOFT) 100 MG tablet 1 tablet daily. 06/20/22  Yes Shade Flood, MD  sertraline (ZOLOFT) 50 MG tablet Take 1 tablet (50 mg total) by mouth daily. Total dose 150mg . 06/20/22  Yes Shade Flood, MD  traMADol (ULTRAM) 50 MG tablet Take 1 tablet (50 mg total) by mouth every 6 (six) hours as needed. 08/10/22  Yes Shade Flood, MD  vitamin E 180 MG (400 UNITS) capsule Take 400 Units by mouth daily.   Yes [provider]   Social History   Socioeconomic History   Marital status: Married    Spouse name: Not on file   Number of children: 3   Years of education: 16   Highest education level: Not on file  Occupational History   Occupation: Photographer, Optician, dispensing  Tobacco Use   Smoking status: Never   Smokeless tobacco: Never  Vaping Use   Vaping Use: Never used  Substance and Sexual Activity   Alcohol use: No   Drug use: No   Sexual activity: Not Currently  Other Topics Concern   Not on file  Social History Narrative   Caffiene decaff tea and coffee, occ diet soda.   Social Determinants of Health   Financial Resource Strain: Medium Risk (02/15/2022)   Overall Financial Resource Strain (CARDIA)    Difficulty of Paying Living Expenses: Somewhat hard  Food Insecurity: No Food Insecurity (06/18/2022)   Hunger Vital Sign    Worried About Running Out of Food in the Last Year: Never true    Ran Out of Food in the Last Year: Never true  Transportation Needs: No Transportation Needs (02/15/2022)   PRAPARE - Administrator, Civil Service (Medical):  No    Lack of Transportation (Non-Medical): No  Physical Activity: Insufficiently Active (02/15/2022)   Exercise Vital Sign    Days of Exercise per Week: 3 days    Minutes of Exercise per Session: 20 min  Stress: No Stress Concern Present (02/15/2022)   [employer] of Occupational Health - Occupational Stress Questionnaire    Feeling of Stress : Not at all  Social Connections: Moderately Integrated (02/15/2022)   Social Connection and Isolation Panel [NHANES]    Frequency of Communication with Friends and Family: Three times a week    Frequency of Social Gatherings with Friends and Family: Three times a week    Attends Religious Services: More than 4 times per year    Active Member of Clubs or Organizations: No    Attends Banker Meetings: Never    Marital Status: Married  Catering manager Violence: Not At Risk (02/15/2022)   Humiliation, Afraid, Rape, and Kick questionnaire    Fear of Current or Ex-Partner: No    Emotionally Abused: No    Physically Abused: No    Sexually Abused: No    Review of Systems   Objective:   Vitals:   08/22/22 0825  BP: 128/60  Pulse: 62  Temp: 98.8 F (37.1 C)  TempSrc: Temporal  SpO2: 98%  Weight: 240 lb 12.8 oz (109.2 kg)  Height: 5\' 8"  (1.727 m)     Physical Exam Vitals reviewed.  Constitutional:      Appearance: He is well-developed.  HENT:     Head: Normocephalic and atraumatic.  Neck:     Vascular: No carotid bruit or JVD.  Cardiovascular:     Rate and Rhythm: Normal rate and regular rhythm.     Heart sounds: Normal heart sounds. No murmur heard. Pulmonary:     Effort: Pulmonary effort is normal.     Breath sounds: Normal breath sounds. No rales.  Musculoskeletal:     Right lower leg: No edema.     Left lower leg: No edema.  Skin:    General: Skin is warm and dry.  Neurological:     Mental Status: He is  alert and oriented to person, place, and time.  Psychiatric:        Mood and Affect: Mood normal.          Assessment & Plan:  BUFFORD SUTHERS is a 72 y.o. male . Type 2 diabetes mellitus with diabetic polyneuropathy, with long-term current use of insulin (HCC) - Plan: Basic metabolic panel  -Check BMP, overall improved readings.  Continue same regimen for now.  AKI (acute kidney injury) (HCC) - Plan: Basic metabolic panel  -Check updated creatinine, continue to avoid nephrotoxins.  Urinary frequency - Plan: POCT urinalysis dipstick, PSA  -In office urinalysis reassuring.  Check PSA.  If persistent consider urology eval  Restless leg syndrome  -Decreased control, additional gabapentin dose at night for now, update on symptoms and can adjust regimen accordingly.  Ideally would like to taper back off tramadol if less knee pain and more neuropathic symptoms, and titrate gabapentin.  Type 2 diabetes mellitus with diabetic polyneuropathy, without long-term current use of insulin (HCC) - Plan: gabapentin (NEURONTIN) 300 MG capsule  -Continue same dose Lantus for now, glimepiride for now.  Trial of higher dose gabapentin.  Recheck 1 month  Meds ordered this encounter  Medications   gabapentin (NEURONTIN) 300 MG capsule    Sig: TAKE 1 TO 3 CAPSULES BY MOUTH TWICE DAILY AS NEEDED    Dispense:  180 capsule    Refill:  0   Patient Instructions  Try taking 3rd pill of gabapentin at night. If that helps, may be able to cut back on the tramadol but let me know when refill needed.  If urine test and prostate test are ok, I may refer you to urology to discuss the frequent urination.  No change in diabetes meds for now.  Recheck in 1 month for restless leg and leg pain issues. Repeat diabetes testing then.  Return to the clinic or go to the nearest emergency room if any of your symptoms worsen or new symptoms occur.       Signed,   Meredith Staggers, MD [facility], [facility] 08/22/22 8:43 AM

## 2022-08-22 NOTE — Patient Instructions (Addendum)
Try taking 3rd pill of gabapentin at night. If that helps, may be able to cut back on the tramadol but let me know when refill needed.  If urine test and prostate test are ok, I may refer you to urology to discuss the frequent urination.  No change in diabetes meds for now.  Recheck in 1 month for restless leg and leg pain issues. Repeat diabetes testing then.  Return to the clinic or go to the nearest emergency room if any of your symptoms worsen or new symptoms occur.

## 2022-08-23 ENCOUNTER — Encounter: Payer: Self-pay | Admitting: Family Medicine

## 2022-08-28 ENCOUNTER — Ambulatory Visit: Payer: PPO | Admitting: Neurology

## 2022-08-28 DIAGNOSIS — R0683 Snoring: Secondary | ICD-10-CM

## 2022-08-28 DIAGNOSIS — G4733 Obstructive sleep apnea (adult) (pediatric): Secondary | ICD-10-CM | POA: Diagnosis not present

## 2022-08-28 DIAGNOSIS — R351 Nocturia: Secondary | ICD-10-CM

## 2022-08-28 DIAGNOSIS — Z9189 Other specified personal risk factors, not elsewhere classified: Secondary | ICD-10-CM

## 2022-08-28 DIAGNOSIS — E669 Obesity, unspecified: Secondary | ICD-10-CM

## 2022-08-30 NOTE — Progress Notes (Signed)
See procedure note.

## 2022-08-31 ENCOUNTER — Other Ambulatory Visit: Payer: Self-pay | Admitting: Family Medicine

## 2022-08-31 DIAGNOSIS — E7800 Pure hypercholesterolemia, unspecified: Secondary | ICD-10-CM

## 2022-08-31 MED ORDER — PRAVASTATIN SODIUM 80 MG PO TABS: 80.0000 mg | ORAL_TABLET | Freq: Every day | ORAL | 1 refills | Status: DC

## 2022-08-31 NOTE — Procedures (Signed)
See procedure note.  [facility] [address], [place], [place] [address] [phone]

## 2022-08-31 NOTE — Addendum Note (Signed)
Addended by: Star Age on: 08/31/2022 01:03 PM   Modules accepted: Orders

## 2022-08-31 NOTE — Progress Notes (Signed)
Pravastatin refilled.

## 2022-09-03 ENCOUNTER — Telehealth: Payer: Self-pay | Admitting: *Deleted

## 2022-09-03 NOTE — Telephone Encounter (Signed)
Called pt & LVM with office number asking for call back.

## 2022-09-03 NOTE — Telephone Encounter (Signed)
-----   Message from Star Age, MD sent at 08/31/2022  1:03 PM EST ----- Patient referred by Dr. Carlota Raspberry, seen by me on 07/05/2022, diagnostic PSG on 08/28/2022.    Please call and notify the patient that the recent home sleep test showed evidence of mild sleep apnea but he did not appear to have achieved significant amount of REM sleep or dream sleep which could underestimate his sleep apnea significantly.  I would recommend a trial of AutoPap therapy at home. This means, that we don't have to bring him in for a sleep study with CPAP, but will let him try an autoPAP machine at home, through a DME company (of his choice, or as per insurance requirement). The DME representative will educate him on how to use the machine, how to put the mask on, etc. I have placed an order in the chart. Please send referral, talk to patient, send report to referring MD. We will need a FU in sleep clinic for 10 weeks post-PAP set up, please arrange that with me or one of our NPs. Thanks,   Star Age, MD, PhD [facility])

## 2022-09-04 NOTE — Telephone Encounter (Signed)
Noted, thank you

## 2022-09-04 NOTE — Telephone Encounter (Signed)
I called pt and relayed the results of the sleep study to him as per Dr. Guadelupe Sabin result note.  He verbalizes understanding. He states that he and his wife have some other medical things coming up and he does not want to pursue at this time.  I offered to send order to DME and he could get information re: authorization of autopap, and have that , but he said would like to wait.  He did relay that he did speak to Dr. Rexene Alberts when in the office and understands importance.  He will call back when ready to proceed.

## 2022-09-04 NOTE — Telephone Encounter (Signed)
Pt returned call. Please call back when available. 

## 2022-09-05 DIAGNOSIS — R972 Elevated prostate specific antigen [PSA]: Secondary | ICD-10-CM

## 2022-09-05 NOTE — Progress Notes (Signed)
I spoke with the patient about this. Patient agrees to follow up with Urology.   [place]  [phone]

## 2022-09-20 ENCOUNTER — Other Ambulatory Visit (INDEPENDENT_AMBULATORY_CARE_PROVIDER_SITE_OTHER): Payer: PPO

## 2022-09-20 ENCOUNTER — Telehealth (INDEPENDENT_AMBULATORY_CARE_PROVIDER_SITE_OTHER): Payer: PPO | Admitting: Family Medicine

## 2022-09-20 ENCOUNTER — Encounter: Payer: Self-pay | Admitting: Family Medicine

## 2022-09-20 VITALS — Wt 234.0 lb

## 2022-09-20 DIAGNOSIS — R197 Diarrhea, unspecified: Secondary | ICD-10-CM | POA: Diagnosis not present

## 2022-09-20 DIAGNOSIS — E1142 Type 2 diabetes mellitus with diabetic polyneuropathy: Secondary | ICD-10-CM

## 2022-09-20 DIAGNOSIS — R112 Nausea with vomiting, unspecified: Secondary | ICD-10-CM

## 2022-09-20 DIAGNOSIS — Z794 Long term (current) use of insulin: Secondary | ICD-10-CM

## 2022-09-20 LAB — COMPREHENSIVE METABOLIC PANEL
ALT: 13 U/L (ref 0–53)
AST: 14 U/L (ref 0–37)
Albumin: 4.3 g/dL (ref 3.5–5.2)
Alkaline Phosphatase: 84 U/L (ref 39–117)
BUN: 26 mg/dL — ABNORMAL HIGH (ref 6–23)
CO2: 28 mEq/L (ref 19–32)
Calcium: 9.3 mg/dL (ref 8.4–10.5)
Chloride: 97 mEq/L (ref 96–112)
Creatinine, Ser: 1.5 mg/dL (ref 0.40–1.50)
GFR: 45.82 mL/min — ABNORMAL LOW (ref >60.00)
Glucose, Bld: 298 mg/dL — ABNORMAL HIGH (ref 70–99)
Potassium: 5.1 mEq/L (ref 3.5–5.1)
Sodium: 133 mEq/L — ABNORMAL LOW (ref 135–145)
Total Bilirubin: 0.3 mg/dL (ref 0.2–1.2)
Total Protein: 7.3 g/dL (ref 6.0–8.3)

## 2022-09-20 LAB — LIPASE: Lipase: 22 U/L (ref 11.0–59.0)

## 2022-09-20 LAB — HEMOGLOBIN A1C: Hgb A1c MFr Bld: 10.2 % — ABNORMAL HIGH (ref 4.6–6.5)

## 2022-09-20 MED ORDER — ONDANSETRON HCL 4 MG PO TABS: 4.0000 mg | ORAL_TABLET | Freq: Three times a day (TID) | ORAL | 0 refills | Status: DC | PRN

## 2022-09-20 NOTE — Patient Instructions (Signed)
Based on persistent elevated home readings, increase insulin to 16 units for now.  Watch diet.  If any low blood sugars, return to the lower dose and let me know right away.  Recheck in 2 weeks with home readings.  Nausea, diarrhea likely viral infection, Zofran if needed for nausea but that can be constipating so only use if needed.  Small sips of fluids frequently important, especially with your previous kidney test.  Labs today to check kidney function as well as blood sugar and 93-month blood sugar.  I will also check a pancreas test and some electrolytes, liver tests.  If any fever, abdominal pain or worsening symptoms be seen right away as we discussed.  If any acute concerns on labs that should have those back today.  Hope you feel better soon.

## 2022-09-20 NOTE — Progress Notes (Signed)
Virtual Visit via Video Note  I connected with Senaida Ores on 09/20/22 at 9:14 AM by a video enabled telemedicine application and verified that I am speaking with the correct person using two identifiers.  Patient location: home. By self.  My location: office - [place].    I discussed the limitations, risks, security and privacy concerns of performing an evaluation and management service by telephone and the availability of in person appointments. I also discussed with the patient that there may be a patient responsible charge related to this service. The patient expressed understanding and agreed to proceed, consent obtained  Chief complaint:  Chief Complaint  Patient presents with   Diabetes    Pt has changed visit from in person to video due to not feeling well this morning, notes some struggle with balancing insulin and he will still get some highs     History of Present Illness: TRE BARCELLONA is a 72 y.o. male  Diabetes: With neuropathy, hyperglycemia, elevated creatinine/AKI.  Started on Lantus in December 2023.  Was having some lower readings and decreased back to 10 units at prior visit, but was back up to 14 units at his February 21st visit.  Continue glimepiride 4 mg twice daily.  Off metformin due to elevated creatinine and nephrology recommendation.  Additionally stopped HCTZ, PPI and avoidance of other nephrotoxins/NSAIDs.  gabapentin 300 mg 1-3 tabs twice daily.  Also treating restless leg syndrome with additional gabapentin dose discussed at night at his last visit.  No change in diabetic medications at that time as his home readings have been in the low 100s fasting, postprandial in the high 100s.  Lowest 85 at last visit. Still on 14 units lantus, and glimepiride. Only missed 1 day of insulin. No other missed doses. Some dietary indiscretion at times.  No blurry vision, HA. Nauseas, diarrhea since last night - see below.   Since last visit, home  readings: Fasting:187, 145, 171, 155, 175, 153.  252 this morning.  Postprandial: 291, 229, 245.  Symptomatic lows: none. Lowest 80 few weeks ago. Usually running higher.  Microalbumin: Normal 11/30/2021 Optho, foot exam, pneumovax:  Ophthalmology: due to schedule.   Lab Results  Component Value Date   HGBA1C 8.8 (H) 06/20/2022   HGBA1C 8.3 (H) 03/15/2022   HGBA1C 8.4 (A) 11/30/2021   Lab Results  Component Value Date   MICROALBUR 1.4 11/30/2021   LDLCALC 72 03/15/2022   CREATININE 1.55 (H) 08/22/2022  Egfr 44  Nausea Started last night. More nausea today. 1 small episode of vomiting once this am. No hematemesis. No abdominal pain. Diarrhea started this morning. No fever. No sick contacts or recent travel. Able to drink fluids.  Took insulin last night. Glucose 252 this morning.    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   Trigger finger, left little finger 02/04/2020   Carpal tunnel syndrome, left upper limb 11/24/2019   Unilateral primary osteoarthritis, left knee 11/02/2019   Unilateral primary osteoarthritis, right knee 11/02/2019   Carpal tunnel syndrome, right upper limb 02/18/2018   Anxiety and depression 10/28/2016   Gastroesophageal reflux disease without esophagitis 10/28/2016   Seasonal allergic rhinitis due to pollen 10/28/2016   Primary osteoarthritis of left knee 07/25/2015   Essential hypertension 07/25/2015   Hypercholesteremia 07/25/2015   Type 2 diabetes mellitus with diabetic polyneuropathy, without long-term current use of insulin ([place]) 07/25/2015   Pollen allergies 02/10/2014   Allergic  rhinitis due to animal (cat) (dog) hair and dander 09/15/2012   Extrinsic asthma 09/13/2011   Proteinuria 11/24/2010   Past Medical History:  Diagnosis Date   Allergic rhinitis    Allergy    Anxiety    Arthritis    Benign hypertension    Cataract    Depression    Diabetes mellitus ([place])    type 2     GERD (gastroesophageal reflux disease)    History of pneumonia    HLD (hyperlipidemia)    Neuromuscular disorder ([place])    pt denies at 8/19/visit    Pneumonia    hx of several times per pt - last episode 4-5 years ago    Past Surgical History:  Procedure Laterality Date   CARPAL TUNNEL RELEASE Bilateral 2020 Right, 2021 left   CATARACT EXTRACTION     bilateral   EYE SURGERY     KNEE SURGERY     right   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   Procedure: LEFT TOTAL KNEE ARTHROPLASTY;  Surgeon: Mcarthur Rossetti, MD;  Location: [facility];  Service: Orthopedics;  Laterality: Left;   Allergies  Allergen Reactions   Codeine Palpitations, Shortness Of Breath and Other (See Comments)   Pioglitazone Other (See Comments), Shortness Of Breath and Swelling    Blood sugar went up and down and sweating profusely   Prior to Admission medications   Medication Sig Start Date End Date Taking? Authorizing Provider  albuterol (VENTOLIN HFA) 108 (90 Base) MCG/ACT inhaler INHALE 2 PUFFS BY MOUTH EVERY 6 HOURS AS NEEDED FOR WHEEZING OR SHORTNESS OF BREATH 03/15/22  Yes Wendie Agreste, MD  cetirizine (ZYRTEC) 10 MG tablet Take 10 mg by mouth daily as needed for allergies.   Yes [provider]  Cholecalciferol (VITAMIN D) 125 MCG (5000 UT) CAPS Take 5,000 Units by mouth daily.   Yes [provider]  Cyanocobalamin (VITAMIN B 12 PO) Take 1,000 mg by mouth daily.    Yes [provider]  ferrous sulfate 325 (65 FE) MG EC tablet Take 1 tablet (325 mg total) by mouth daily with breakfast. 10/05/21  Yes Armbruster, Carlota Raspberry, MD  fluticasone (FLONASE) 50 MCG/ACT nasal spray Place 1 spray into both nostrils daily.   Yes [provider]  gabapentin (NEURONTIN) 300 MG capsule TAKE 1 TO 3 CAPSULES BY MOUTH TWICE DAILY AS NEEDED 08/22/22  Yes Wendie Agreste, MD  glimepiride (AMARYL) 4 MG tablet Take 1 tablet (4 mg total) by mouth 2 (two) times daily. 06/01/22  Yes Wendie Agreste, MD  insulin glargine (LANTUS SOLOSTAR) 100 UNIT/ML Solostar Pen Inject 10 Units into the skin daily. Patient taking differently: Inject 14 Units into the skin daily. 07/11/22  Yes Wendie Agreste, MD  Insulin Pen Needle (PEN NEEDLES) 32G X 4 MM MISC 1 Application by Does not apply route daily. 06/20/22  Yes Wendie Agreste, MD  Lancets [facility] DELICA PLUS [id number]) MISC USE 1 TO CHECK GLUCOSE ONCE DAILY 08/04/18  Yes [provider]  lisinopril (ZESTRIL) 20 MG tablet Take 1 tablet (20 mg total) by mouth daily. 03/15/22  Yes Wendie Agreste, MD  montelukast (SINGULAIR) 10 MG tablet Take 1 tablet (10 mg total) by mouth at bedtime. 03/15/22  Yes Wendie Agreste, MD  Multiple Vitamin (MULTIVITAMIN WITH MINERALS) TABS tablet Take 1 tablet by mouth daily.   Yes [provider]  ondansetron (ZOFRAN ODT) 4 MG disintegrating tablet Take 1 tablet (4 mg total) by mouth  every 8 (eight) hours as needed for nausea or vomiting. 02/27/20  Yes Mcarthur Rossetti, MD  [facility] ULTRA test strip USE 1 STRIP TO CHECK GLUCOSE ONCE DAILY 11/25/18  Yes Wendie Agreste, MD  pravastatin (PRAVACHOL) 80 MG tablet Take 1 tablet (80 mg total) by mouth daily. 08/31/22  Yes Wendie Agreste, MD  sertraline (ZOLOFT) 100 MG tablet 1 tablet daily. 06/20/22  Yes Wendie Agreste, MD  sertraline (ZOLOFT) 50 MG tablet Take 1 tablet (50 mg total) by mouth daily. Total dose 150mg . 06/20/22  Yes Wendie Agreste, MD  traMADol (ULTRAM) 50 MG tablet Take 1 tablet (50 mg total) by mouth every 6 (six) hours as needed. 08/10/22  Yes Wendie Agreste, MD  vitamin E 180 MG (400 UNITS) capsule Take 400 Units by mouth daily.   Yes [provider]   Social History   Socioeconomic History   Marital status: Married    Spouse name: Not on file   Number of children: 3   Years of education: 16   Highest education level: Bachelor's degree (e.g., BA, AB, BS)  Occupational History   Occupation: Tree surgeon, Company secretary  Tobacco Use   Smoking status: Never   Smokeless tobacco: Never  Vaping Use   Vaping Use: Never used  Substance and Sexual Activity   Alcohol use: No   Drug use: No   Sexual activity: Not Currently  Other Topics Concern   Not on file  Social History Narrative   Caffiene decaff tea and coffee, occ diet soda.   Social Determinants of Health   Financial Resource Strain: Low Risk  (09/19/2022)   Overall Financial Resource Strain (CARDIA)    Difficulty of Paying Living Expenses: Not hard at all  Food Insecurity: No Food Insecurity (09/19/2022)   Hunger Vital Sign    Worried About Running Out of Food in the Last Year: Never true    Ran Out of Food in the Last Year: Never true  Transportation Needs: No Transportation Needs (09/19/2022)   PRAPARE - Hydrologist (Medical): No    Lack of Transportation (Non-Medical): No  Physical Activity: Insufficiently Active (09/19/2022)   Exercise Vital Sign    Days of Exercise per Week: 2 days    Minutes of Exercise per Session: 20 min  Stress: No Stress Concern Present (09/19/2022)   [place]    Feeling of Stress : Only a little  Social Connections: Moderately Integrated (09/19/2022)   Social Connection and Isolation Panel [NHANES]    Frequency of Communication with Friends and Family: More than three times a week    Frequency of Social Gatherings with Friends and Family: Twice a week    Attends Religious Services: More than 4 times per year    Active Member of [employer] or Organizations: No    Attends Archivist Meetings: Never    Marital Status: Married  Human resources officer Violence: Not At Risk (02/15/2022)   Humiliation, Afraid, Rape, and Kick questionnaire    Fear of Current or Ex-Partner: No    Emotionally Abused: No    Physically Abused: No    Sexually Abused: No    Observations/Objective: Vitals:   09/20/22 0859   Weight: 234 lb (106.1 kg)  Nontoxic appearance on video.  Speaking in full sentences, no respiratory distress.  Coherent, appropriate responses.   Assessment and Plan: Nausea and vomiting, unspecified vomiting type  Diarrhea, unspecified type  Type 2 diabetes mellitus with diabetic polyneuropathy, with long-term current use of insulin ([facility])  Nausea since last night with 1 episode of vomiting, diarrhea this morning.  Suspected viral illness.  Less likely DKA without abdominal pain and isolated vomiting.  Will arrange for lab visit here this morning to check his bicarb, glucose, and lipase, and renal function given prior AKI.  Importance of fluid intake with small sips frequently discussed.  Zofran prescribed if needed for nausea temporarily.  For diabetes, suspect component of dietary indiscretion but persistent elevated readings as above.  Again unlikely DKA.  Labs today.  Increase insulin by 2 units, 4 total of 16 units/day, no other changes, hypoglycemia precautions and 2-week follow-up.  ER precautions given.   Follow Up Instructions:    I discussed the assessment and treatment plan with the patient. The patient was provided an opportunity to ask questions and all were answered. The patient agreed with the plan and demonstrated an understanding of the instructions.   The patient was advised to call back or seek an in-person evaluation if the symptoms worsen or if the condition fails to improve as anticipated.   Wendie Agreste, MD

## 2022-09-27 DIAGNOSIS — N183 Chronic kidney disease, stage 3 unspecified: Secondary | ICD-10-CM | POA: Diagnosis not present

## 2022-09-27 DIAGNOSIS — E875 Hyperkalemia: Secondary | ICD-10-CM | POA: Diagnosis not present

## 2022-09-27 DIAGNOSIS — N2581 Secondary hyperparathyroidism of renal origin: Secondary | ICD-10-CM | POA: Diagnosis not present

## 2022-09-27 DIAGNOSIS — I129 Hypertensive chronic kidney disease with stage 1 through stage 4 chronic kidney disease, or unspecified chronic kidney disease: Secondary | ICD-10-CM | POA: Diagnosis not present

## 2022-09-27 DIAGNOSIS — D631 Anemia in chronic kidney disease: Secondary | ICD-10-CM | POA: Diagnosis not present

## 2022-09-29 LAB — LAB REPORT - SCANNED
Albumin, Urine POC: 15.9
Creatinine, POC: 127.1 mg/dL
EGFR: 45
Microalb Creat Ratio: 13

## 2022-10-01 ENCOUNTER — Telehealth: Payer: Self-pay | Admitting: Family Medicine

## 2022-10-01 DIAGNOSIS — E1142 Type 2 diabetes mellitus with diabetic polyneuropathy: Secondary | ICD-10-CM

## 2022-10-01 MED ORDER — LANTUS SOLOSTAR 100 UNIT/ML ~~LOC~~ SOPN: 16.0000 [IU] | PEN_INJECTOR | Freq: Every day | SUBCUTANEOUS | 3 refills | Status: DC

## 2022-10-01 NOTE — Addendum Note (Signed)
Addended by: Merri Ray R on: 10/01/2022 05:23 PM   Modules accepted: Orders

## 2022-10-01 NOTE — Telephone Encounter (Signed)
New order placed with adjusted dosing instructions.

## 2022-10-01 NOTE — Telephone Encounter (Signed)
Can we send in new Rx for pt Lantus insulin at his last video visit the dose was increased to 16 units ?

## 2022-10-01 NOTE — Telephone Encounter (Signed)
Caller name: THOMS DERUITER  On DPR?: Yes  Call back number: [phone] (mobile)  Provider they see: Wendie Agreste, MD  Reason for call: Patient states that Dr.Greene increased his insulin to 16. Patient needs a prescription due to the increased of his insulin.  Patient pharmacy is  [facility] [address], [place] - [phone]

## 2022-10-10 DIAGNOSIS — N500 Atrophy of testis: Secondary | ICD-10-CM | POA: Diagnosis not present

## 2022-10-10 DIAGNOSIS — N3941 Urge incontinence: Secondary | ICD-10-CM | POA: Diagnosis not present

## 2022-10-11 ENCOUNTER — Ambulatory Visit (INDEPENDENT_AMBULATORY_CARE_PROVIDER_SITE_OTHER): Payer: PPO | Admitting: Family Medicine

## 2022-10-11 ENCOUNTER — Ambulatory Visit (INDEPENDENT_AMBULATORY_CARE_PROVIDER_SITE_OTHER)
Admission: RE | Admit: 2022-10-11 | Discharge: 2022-10-11 | Disposition: A | Discharge status: Home / Self Care | Payer: PPO | Source: Ambulatory Visit | Attending: Family Medicine | Admitting: Family Medicine

## 2022-10-11 VITALS — BP 132/68 | HR 81 | Temp 98.7°F | Resp 14 | Ht 68.0 in | Wt 244.0 lb

## 2022-10-11 DIAGNOSIS — L97519 Non-pressure chronic ulcer of other part of right foot with unspecified severity: Secondary | ICD-10-CM | POA: Diagnosis not present

## 2022-10-11 DIAGNOSIS — L03119 Cellulitis of unspecified part of limb: Secondary | ICD-10-CM

## 2022-10-11 DIAGNOSIS — R2241 Localized swelling, mass and lump, right lower limb: Secondary | ICD-10-CM

## 2022-10-11 DIAGNOSIS — E11628 Type 2 diabetes mellitus with other skin complications: Secondary | ICD-10-CM | POA: Diagnosis not present

## 2022-10-11 DIAGNOSIS — M7989 Other specified soft tissue disorders: Secondary | ICD-10-CM | POA: Diagnosis not present

## 2022-10-11 MED ORDER — MUPIROCIN 2 % EX OINT: 1.0000 | TOPICAL_OINTMENT | Freq: Two times a day (BID) | CUTANEOUS | 0 refills | Status: DC

## 2022-10-11 MED ORDER — DOXYCYCLINE HYCLATE 100 MG PO TABS: 100.0000 mg | ORAL_TABLET | Freq: Two times a day (BID) | ORAL | 0 refills | Status: DC

## 2022-10-11 MED ORDER — CEFTRIAXONE SODIUM 1 G IJ SOLR
1.0000 g | Freq: Once | INTRAMUSCULAR | Status: AC
  Administered 2022-10-11: 1 g via INTRAMUSCULAR

## 2022-10-11 NOTE — Progress Notes (Signed)
Subjective:  Patient ID: Daniel [name], male    DOB: 06/24/1951  Age: 72 y.o. MRN: [id number]  CC:  Chief Complaint  Patient presents with   Foot Swelling    Swollen right foot, visibly red toe  Painful when walking  Pt states he was clipping toes and clipped skin x8 days ago     HPI Daniel [name] presents for   R toe wound, swelling: Trimmed nails 9 days ago.clipped a little close, some bleeding at end of nail.  Neosporin once after bleeding stopped. Sore 5 days ago.  Past 3 days - more red, swelling, slight soreness at times. No recent treatment other than soap and water with bathing once per day. R foot swelling past 3 days,. No calf pain.  No fever. Feels well otherwise.   Hx of DM with neuropathy.    History 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   Trigger finger, left little finger 02/04/2020   Carpal tunnel syndrome, left upper limb 11/24/2019   Unilateral primary osteoarthritis, left knee 11/02/2019   Unilateral primary osteoarthritis, right knee 11/02/2019   Carpal tunnel syndrome, right upper limb 02/18/2018   Anxiety and depression 10/28/2016   Gastroesophageal reflux disease without esophagitis 10/28/2016   Seasonal allergic rhinitis due to pollen 10/28/2016   Primary osteoarthritis of left knee 07/25/2015   Essential hypertension 07/25/2015   Hypercholesteremia 07/25/2015   Type 2 diabetes mellitus with diabetic polyneuropathy, without long-term current use of insulin 07/25/2015   Pollen allergies 02/10/2014   Allergic rhinitis due to animal (cat) (dog) hair and dander 09/15/2012   Extrinsic asthma 09/13/2011   Proteinuria 11/24/2010   Past Medical History:  Diagnosis Date   Allergic rhinitis    Allergy    Anxiety    Arthritis    Benign hypertension    Cataract    Depression    Diabetes mellitus (HCC)    type 2    GERD (gastroesophageal reflux disease)    History of pneumonia     HLD (hyperlipidemia)    Neuromuscular disorder (HCC)    pt denies at 8/19/visit    Pneumonia    hx of several times per pt - last episode 4-5 years ago    Past Surgical History:  Procedure Laterality Date   CARPAL TUNNEL RELEASE Bilateral 2020 Right, 2021 left   CATARACT EXTRACTION     bilateral   EYE SURGERY     KNEE SURGERY     right   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   Procedure: LEFT TOTAL KNEE ARTHROPLASTY;  Surgeon: Kathryne Hitch, MD;  Location: [facility];  Service: Orthopedics;  Laterality: Left;   Allergies  Allergen Reactions   Codeine Palpitations, Shortness Of Breath and Other (See Comments)   Pioglitazone Other (See Comments), Shortness Of Breath and Swelling    Blood sugar went up and down and sweating profusely   Prior to Admission medications   Medication Sig Start Date End Date Taking? Authorizing Provider  albuterol (VENTOLIN HFA) 108 (90 Base) MCG/ACT inhaler INHALE 2 PUFFS BY MOUTH EVERY 6 HOURS AS NEEDED FOR WHEEZING OR SHORTNESS OF BREATH 03/15/22  Yes Shade Flood, MD  cetirizine (ZYRTEC) 10 MG tablet Take 10 mg by mouth daily as needed for allergies.   Yes [provider]  Cholecalciferol (VITAMIN D) 125 MCG (5000 UT) CAPS Take 5,000 Units by mouth daily.   Yes [provider]  Cyanocobalamin (  VITAMIN B 12 PO) Take 1,000 mg by mouth daily.    Yes [provider]  ferrous sulfate 325 (65 FE) MG EC tablet Take 1 tablet (325 mg total) by mouth daily with breakfast. 10/05/21  Yes Armbruster, Willaim Rayas, MD  fluticasone (FLONASE) 50 MCG/ACT nasal spray Place 1 spray into both nostrils daily.   Yes [provider]  gabapentin (NEURONTIN) 300 MG capsule TAKE 1 TO 3 CAPSULES BY MOUTH TWICE DAILY AS NEEDED 08/22/22  Yes Shade Flood, MD  glimepiride (AMARYL) 4 MG tablet Take 1 tablet (4 mg total) by mouth 2 (two) times daily. 06/01/22  Yes Shade Flood, MD  insulin glargine (LANTUS SOLOSTAR) 100 UNIT/ML Solostar  Pen Inject 16 Units into the skin daily. 10/01/22  Yes Shade Flood, MD  Insulin Pen Needle (PEN NEEDLES) 32G X 4 MM MISC 1 Application by Does not apply route daily. 06/20/22  Yes Shade Flood, MD  Lancets [facility] DELICA PLUS LANCET33G) MISC USE 1 TO CHECK GLUCOSE ONCE DAILY 08/04/18  Yes [provider]  lisinopril (ZESTRIL) 20 MG tablet Take 1 tablet (20 mg total) by mouth daily. 03/15/22  Yes Shade Flood, MD  montelukast (SINGULAIR) 10 MG tablet Take 1 tablet (10 mg total) by mouth at bedtime. 03/15/22  Yes Shade Flood, MD  Multiple Vitamin (MULTIVITAMIN WITH MINERALS) TABS tablet Take 1 tablet by mouth daily.   Yes [provider]  ondansetron (ZOFRAN) 4 MG tablet Take 1 tablet (4 mg total) by mouth every 8 (eight) hours as needed for nausea or vomiting. 09/20/22  Yes Shade Flood, MD  [facility] ULTRA test strip USE 1 STRIP TO CHECK GLUCOSE ONCE DAILY 11/25/18  Yes Shade Flood, MD  pravastatin (PRAVACHOL) 80 MG tablet Take 1 tablet (80 mg total) by mouth daily. 08/31/22  Yes Shade Flood, MD  sertraline (ZOLOFT) 100 MG tablet 1 tablet daily. 06/20/22  Yes Shade Flood, MD  sertraline (ZOLOFT) 50 MG tablet Take 1 tablet (50 mg total) by mouth daily. Total dose 150mg . 06/20/22  Yes Shade Flood, MD  traMADol (ULTRAM) 50 MG tablet Take 1 tablet (50 mg total) by mouth every 6 (six) hours as needed. 08/10/22  Yes Shade Flood, MD  vitamin E 180 MG (400 UNITS) capsule Take 400 Units by mouth daily.   Yes [provider]   Social History   Socioeconomic History   Marital status: Married    Spouse name: Not on file   Number of children: 3   Years of education: 16   Highest education level: Bachelor's degree (e.g., BA, AB, BS)  Occupational History   Occupation: Photographer, Optician, dispensing  Tobacco Use   Smoking status: Never   Smokeless tobacco: Never  Vaping Use   Vaping Use: Never used  Substance and Sexual Activity    Alcohol use: No   Drug use: No   Sexual activity: Not Currently  Other Topics Concern   Not on file  Social History Narrative   Caffiene decaff tea and coffee, occ diet soda.   Social Determinants of Health   Financial Resource Strain: Low Risk  (09/19/2022)   Overall Financial Resource Strain (CARDIA)    Difficulty of Paying Living Expenses: Not hard at all  Food Insecurity: No Food Insecurity (09/19/2022)   Hunger Vital Sign    Worried About Running Out of Food in the Last Year: Never true    Ran Out of Food in the Last  Year: Never true  Transportation Needs: No Transportation Needs (09/19/2022)   PRAPARE - Administrator, Civil Service (Medical): No    Lack of Transportation (Non-Medical): No  Physical Activity: Insufficiently Active (09/19/2022)   Exercise Vital Sign    Days of Exercise per Week: 2 days    Minutes of Exercise per Session: 20 min  Stress: No Stress Concern Present (09/19/2022)   [employer] of Occupational Health - Occupational Stress Questionnaire    Feeling of Stress : Only a little  Social Connections: Moderately Integrated (09/19/2022)   Social Connection and Isolation Panel [NHANES]    Frequency of Communication with Friends and Family: More than three times a week    Frequency of Social Gatherings with Friends and Family: Twice a week    Attends Religious Services: More than 4 times per year    Active Member of [employer] or Organizations: No    Attends Banker Meetings: Never    Marital Status: Married  Catering manager Violence: Not At Risk (02/15/2022)   Humiliation, Afraid, Rape, and Kick questionnaire    Fear of Current or Ex-Partner: No    Emotionally Abused: No    Physically Abused: No    Sexually Abused: No    Review of Systems Per HPI.   Objective:   Vitals:   10/11/22 1348  BP: 132/68  Pulse: 81  Resp: 14  Temp: 98.7 F (37.1 C)  TempSrc: Temporal  SpO2: 95%  Weight: 244 lb (110.7 kg)  Height: 5\' 8"   (1.727 m)    Physical Exam Constitutional:      General: He is not in acute distress.    Appearance: Normal appearance. He is well-developed.  HENT:     Head: Normocephalic and atraumatic.  Cardiovascular:     Rate and Rhythm: Normal rate.  Pulmonary:     Effort: Pulmonary effort is normal.  Musculoskeletal:     Right lower leg: Edema present.  Skin:    Comments: See photos -ulceration at the distal medial aspect of the third toe, minimal discharge overlying ulceration.  No visualized bone or deep structures.  No bony tenderness of toe or midfoot.  Erythema extending to the base of the third toe with soft tissue swelling across the dorsal foot, possible faint erythema compared to left side and some edema to the ankle, lower leg.  Calf nontender, negative Homans.  Faint warmth/erythema to the medial lower leg on right only.  Neurological:     Mental Status: He is alert and oriented to person, place, and time.  Psychiatric:        Mood and Affect: Mood normal.           Assessment & Plan:  Daniel [name] is a 72 y.o. male . Cellulitis in diabetic foot - Plan: CBC, WOUND CULTURE, cefTRIAXone (ROCEPHIN) injection 1 g, doxycycline (VIBRA-TABS) 100 MG tablet, mupirocin ointment (BACTROBAN) 2 %  Ulcer of toe of right foot, unspecified ulcer stage - Plan: CBC, WOUND CULTURE, cefTRIAXone (ROCEPHIN) injection 1 g, doxycycline (VIBRA-TABS) 100 MG tablet, mupirocin ointment (BACTROBAN) 2 %  Localized swelling of right foot  Right third toe ulceration with secondary cellulitis.  No bony tenderness.  Soft tissue swelling, erythema to the foot and some edema to right lower leg.  Will check x-ray to rule out apparent foreign body, unlikely bony involvement/osteomyelitis but will need to be closely monitored.  Diabetic with neuropathy.  No systemic symptoms at this time.  -1 g Rocephin given,  start doxycycline 100 mg twice daily, Bactroban ointment topical to wound and clean with soap and  water, new bandage daily.  X-ray ordered.  Recheck in 4 days with my colleague as long as symptoms are improving, ER or urgent care sooner if any worsening or new symptoms.  Tylenol for pain if needed, minimal discomfort at present.    Meds ordered this encounter  Medications   cefTRIAXone (ROCEPHIN) injection 1 g   doxycycline (VIBRA-TABS) 100 MG tablet    Sig: Take 1 tablet (100 mg total) by mouth 2 (two) times daily.    Dispense:  20 tablet    Refill:  0   mupirocin ointment (BACTROBAN) 2 %    Sig: Apply 1 Application topically 2 (two) times daily. For 1 week    Dispense:  22 g    Refill:  0   Patient Instructions  There is appear to be an ulcer at the base of your toe that is likely the cause of infection or cellulitis.  It appears to be causing some swelling and redness in the foot as well.  Please have x-ray performed today to make sure we do not see any foreign body or apparent bone involvement.  Antibiotic injection given in the office but start the oral antibiotic twice per day, cleanse area 1-2 times per day with soap and water and then apply Bactroban ointment afterwards with clean dressing.  Recheck with my colleague on Monday as long as the area is improving, but if any worsening swelling, worsening redness, fever/chills, or other new/worsening symptoms be seen through urgent care or ER.   Cellulitis, Adult  Cellulitis is a skin infection. The infected area is usually warm, red, swollen, and tender. It most commonly occurs on the lower body, such as the legs, feet, and toes, but this condition can occur on any part of the body. The infection can travel to the muscles, blood, and underlying tissue and become life-threatening without treatment. It is important to get medical treatment right away for this condition. What are the causes? Cellulitis is caused by bacteria. The bacteria enter through a break in the skin, such as a cut, burn, insect or animal bite, open sore, or  crack. What increases the risk? This condition is more likely to occur in people who: Have a weak body's defense system (immune system). Are older than 74 years old. Have diabetes. Have a type of long-term (chronic) liver disease (cirrhosis) or kidney disease. Are obese. Have a skin condition such as: An itchy rash, such as eczema or psoriasis. A fungal rash on the feet or in skinfolds. Blistering rashes, such as shingles or chickenpox. Slow movement of blood in the veins (venous stasis). Fluid buildup below the skin (edema). Have open wounds on the skin, such as cuts, puncture wounds, burns, bites, scrapes, tattoos, piercings, or wounds from surgery. Have had radiation therapy. Use IV drugs. What are the signs or symptoms? Symptoms of this condition include: Skin that looks red, purple, or slightly darker than your usual skin color. Streaks or spots on the skin. Swollen area of the skin. Tenderness or pain when an area of the skin is touched. Warm skin. Fever or chills. Blisters. Tiredness (fatigue). How is this diagnosed? This condition is diagnosed based on a medical history and physical exam. You may also have tests, including: Blood tests. Imaging tests. Tests on a sample of fluid taken from the wound (wound culture). How is this treated? Treatment for this condition may include: Medicines.  These may include antibiotics or medicines to treat allergies (antihistamines). Rest. Applying cold or warm wet cloths (compresses) to the skin. If the condition is severe, you may need to stay in the hospital and get antibiotics through an IV. The infection usually starts to get better within 1-2 days of treatment. Follow these instructions at home: Medicines Take over-the-counter and prescription medicines only as told by your health care provider. If you were prescribed antibiotics, take them as told by your provider. Do not stop using the antibiotic even if you start to feel  better. General instructions Drink enough fluid to keep your pee (urine) pale yellow. Do not touch or rub the infected area. Raise (elevate) the infected area above the level of your heart while you are sitting or lying down. Return to your normal activities as told by your provider. Ask your provider what activities are safe for you. Apply warm or cold compresses to the affected area as told by your provider. Keep all follow-up visits. Your provider will need to make sure that a more serious infection is not developing. Contact a health care provider if: You have a fever. Your symptoms do not improve within 1-2 days of starting treatment or you develop new symptoms. Your bone or joint underneath the infected area becomes painful after the skin has healed. Your infection returns in the same area or another area. Signs of this may include: You notice a swollen bump in the infected area. Your red area gets larger, turns dark in color, or becomes more painful. Drainage increases. Pus or a bad smell develops in your infected area. You have more pain. You feel ill and have muscle aches and weakness. You develop vomiting or diarrhea that will not go away. Get help right away if: You notice red streaks coming from the infected area. You notice the skin turns purple or black and falls off. This symptom may be an emergency. Get help right away. Call 911. Do not wait to see if the symptom will go away. Do not drive yourself to the hospital. This information is not intended to replace advice given to you by your health care provider. Make sure you discuss any questions you have with your health care provider. Document Revised: 02/13/2022 Document Reviewed: 02/13/2022 Elsevier Patient Education  2023 Elsevier Inc.     Signed,   Meredith Staggers, MD [facility], [facility] 10/11/22 2:32 PM

## 2022-10-11 NOTE — Patient Instructions (Signed)
There is appear to be an ulcer at the base of your toe that is likely the cause of infection or cellulitis.  It appears to be causing some swelling and redness in the foot as well.  Please have x-ray performed today to make sure we do not see any foreign body or apparent bone involvement.  Antibiotic injection given in the office but start the oral antibiotic twice per day, cleanse area 1-2 times per day with soap and water and then apply Bactroban ointment afterwards with clean dressing.  Recheck with my colleague on Monday as long as the area is improving, but if any worsening swelling, worsening redness, fever/chills, or other new/worsening symptoms be seen through urgent care or ER.   Cellulitis, Adult  Cellulitis is a skin infection. The infected area is usually warm, red, swollen, and tender. It most commonly occurs on the lower body, such as the legs, feet, and toes, but this condition can occur on any part of the body. The infection can travel to the muscles, blood, and underlying tissue and become life-threatening without treatment. It is important to get medical treatment right away for this condition. What are the causes? Cellulitis is caused by bacteria. The bacteria enter through a break in the skin, such as a cut, burn, insect or animal bite, open sore, or crack. What increases the risk? This condition is more likely to occur in people who: Have a weak body's defense system (immune system). Are older than 74 years old. Have diabetes. Have a type of long-term (chronic) liver disease (cirrhosis) or kidney disease. Are obese. Have a skin condition such as: An itchy rash, such as eczema or psoriasis. A fungal rash on the feet or in skinfolds. Blistering rashes, such as shingles or chickenpox. Slow movement of blood in the veins (venous stasis). Fluid buildup below the skin (edema). Have open wounds on the skin, such as cuts, puncture wounds, burns, bites, scrapes, tattoos, piercings,  or wounds from surgery. Have had radiation therapy. Use IV drugs. What are the signs or symptoms? Symptoms of this condition include: Skin that looks red, purple, or slightly darker than your usual skin color. Streaks or spots on the skin. Swollen area of the skin. Tenderness or pain when an area of the skin is touched. Warm skin. Fever or chills. Blisters. Tiredness (fatigue). How is this diagnosed? This condition is diagnosed based on a medical history and physical exam. You may also have tests, including: Blood tests. Imaging tests. Tests on a sample of fluid taken from the wound (wound culture). How is this treated? Treatment for this condition may include: Medicines. These may include antibiotics or medicines to treat allergies (antihistamines). Rest. Applying cold or warm wet cloths (compresses) to the skin. If the condition is severe, you may need to stay in the hospital and get antibiotics through an IV. The infection usually starts to get better within 1-2 days of treatment. Follow these instructions at home: Medicines Take over-the-counter and prescription medicines only as told by your health care provider. If you were prescribed antibiotics, take them as told by your provider. Do not stop using the antibiotic even if you start to feel better. General instructions Drink enough fluid to keep your pee (urine) pale yellow. Do not touch or rub the infected area. Raise (elevate) the infected area above the level of your heart while you are sitting or lying down. Return to your normal activities as told by your provider. Ask your provider what activities are safe  for you. Apply warm or cold compresses to the affected area as told by your provider. Keep all follow-up visits. Your provider will need to make sure that a more serious infection is not developing. Contact a health care provider if: You have a fever. Your symptoms do not improve within 1-2 days of starting  treatment or you develop new symptoms. Your bone or joint underneath the infected area becomes painful after the skin has healed. Your infection returns in the same area or another area. Signs of this may include: You notice a swollen bump in the infected area. Your red area gets larger, turns dark in color, or becomes more painful. Drainage increases. Pus or a bad smell develops in your infected area. You have more pain. You feel ill and have muscle aches and weakness. You develop vomiting or diarrhea that will not go away. Get help right away if: You notice red streaks coming from the infected area. You notice the skin turns purple or black and falls off. This symptom may be an emergency. Get help right away. Call 911. Do not wait to see if the symptom will go away. Do not drive yourself to the hospital. This information is not intended to replace advice given to you by your health care provider. Make sure you discuss any questions you have with your health care provider. Document Revised: 02/13/2022 Document Reviewed: 02/13/2022 Elsevier Patient Education  2023 [employer].

## 2022-10-12 ENCOUNTER — Encounter: Payer: Self-pay | Admitting: Family Medicine

## 2022-10-12 LAB — CBC
HCT: 34 % — ABNORMAL LOW (ref 39.0–52.0)
Hemoglobin: 11.3 g/dL — ABNORMAL LOW (ref 13.0–17.0)
MCHC: 33.3 g/dL (ref 30.0–36.0)
MCV: 90.7 fl (ref 78.0–100.0)
Platelets: 310 10*3/uL (ref 150.0–400.0)
RBC: 3.74 Mil/uL — ABNORMAL LOW (ref 4.22–5.81)
RDW: 12.3 % (ref 11.5–15.5)
WBC: 9.5 10*3/uL (ref 4.0–10.5)

## 2022-10-13 LAB — WOUND CULTURE
MICRO NUMBER:: 14812848
SPECIMEN QUALITY:: ADEQUATE

## 2022-10-15 ENCOUNTER — Encounter: Payer: Self-pay | Admitting: Family Medicine

## 2022-10-15 ENCOUNTER — Ambulatory Visit (INDEPENDENT_AMBULATORY_CARE_PROVIDER_SITE_OTHER): Payer: PPO | Admitting: Family Medicine

## 2022-10-15 VITALS — BP 134/62 | HR 77 | Temp 98.4°F | Ht 68.0 in | Wt 244.1 lb

## 2022-10-15 DIAGNOSIS — L97519 Non-pressure chronic ulcer of other part of right foot with unspecified severity: Secondary | ICD-10-CM

## 2022-10-15 DIAGNOSIS — L03119 Cellulitis of unspecified part of limb: Secondary | ICD-10-CM | POA: Diagnosis not present

## 2022-10-15 DIAGNOSIS — E11628 Type 2 diabetes mellitus with other skin complications: Secondary | ICD-10-CM | POA: Diagnosis not present

## 2022-10-15 NOTE — Progress Notes (Signed)
Acute Office Visit   Subjective:  Patient ID: Daniel [name], male    DOB: 23-Jan-1951, 72 y.o.   MRN: [id number]  Chief Complaint  Patient presents with   Ulcer    Pt is here today to have Ulcer on right foot looked at. Pt reports his foot is much better. Pt reports no pain.     Edema    HPI Patient is 72 year old caucasian male that presents for follow up for an ulcer on third toe of the right foot and cellulitis to the right foot. On previous visit, 10/11/2022, patient was seen by his primary care provider, Dr. Herma Carson. This all started with patient trimming his nails, clipped a little close, and some bleeding occurred at the end of the nail. Prior to initial visit, he was cleaning area with soap and water while bathing and applying Neosporin. At his initial visit, Rocephin 1gm was administered at visit, prescribed Doxycycline  BID for 10 days, and Mupirocin Ointment was prescribed to apply when changing bandage twice a day.  He reports he has been taking Doxycycline BID, and performing dressing changes BID with applying Mupirocin Ointment.   ROS See HPI above     Objective:   BP 134/62   Pulse 77   Temp 98.4 F (36.9 C)   Ht  (1.727 m)   Wt 244 lb 2 oz (110.7 kg)   SpO2 95%   BMI 37.12 kg/m    Physical Exam Vitals reviewed.  Constitutional:      General: He is not in acute distress.    Appearance: Normal appearance. He is obese. He is not ill-appearing, toxic-appearing or diaphoretic.  HENT:     Head: Normocephalic and atraumatic.  Eyes:     General:        Right eye: No discharge.        Left eye: No discharge.     Conjunctiva/sclera: Conjunctivae normal.  Cardiovascular:     Rate and Rhythm: Normal rate.  Pulmonary:     Effort: Pulmonary effort is normal. No respiratory distress.  Musculoskeletal:        General: Normal range of motion.     Right lower leg: Edema present.  Skin:    General: Skin is warm and dry.     Comments: See photos of  right foot and right third toe-Healing ulceration at the distal medial aspect of the third toe, no discharge noted. Less erythema extending to the base of the third toe with mild soft tissue swelling. Faint erythema to the right medial lower leg.   Neurological:     General: No focal deficit present.     Mental Status: He is alert and oriented to person, place, and time. Mental status is at baseline.  Psychiatric:        Mood and Affect: Mood normal.        Behavior: Behavior normal.        Thought Content: Thought content normal.        Judgment: Judgment normal.          Assessment & Plan:  Ulcer of toe of right foot, unspecified ulcer stage -     Apply dressing  Cellulitis in diabetic foot  -Continue to take Doxycycline twice daily.  -Continue with cleaning the area, pat dry, and apply BactrobTIRAN SAUSEDAday. Keep covered at all times.  -Right foot and right toe is improving.   -Ordered dressing to be applied in office.  Right 3rd toe: Cleanse with saline, pat dry, apply triple antibiotic ointment, and cover area with non-stick dressing.  This was completed by Jonette Pesa, CMA. Patient tolerated it well.  Return in about 2 days (around 10/17/2022) for Needs a follow up with Dr. Neva Seat either Wednesday or Thursday. Zandra Abts, NP

## 2022-10-15 NOTE — Patient Instructions (Addendum)
-  Continue to take Doxycycline twice daily.  -Continue with cleaning the area, pat dry, and apply Bactroban twice a day. Keep covered at all times.  -Follow up with Dr. Neva Seat on Wednesday or Thursday.

## 2022-10-17 ENCOUNTER — Ambulatory Visit (INDEPENDENT_AMBULATORY_CARE_PROVIDER_SITE_OTHER): Payer: PPO | Admitting: Family Medicine

## 2022-10-17 ENCOUNTER — Encounter: Payer: Self-pay | Admitting: Family Medicine

## 2022-10-17 VITALS — BP 136/78 | HR 59 | Temp 98.3°F | Ht 68.0 in | Wt 242.4 lb

## 2022-10-17 DIAGNOSIS — L97519 Non-pressure chronic ulcer of other part of right foot with unspecified severity: Secondary | ICD-10-CM | POA: Diagnosis not present

## 2022-10-17 DIAGNOSIS — L03119 Cellulitis of unspecified part of limb: Secondary | ICD-10-CM

## 2022-10-17 DIAGNOSIS — E11628 Type 2 diabetes mellitus with other skin complications: Secondary | ICD-10-CM | POA: Diagnosis not present

## 2022-10-17 NOTE — Patient Instructions (Signed)
Toe is improving. No change in meds.  There may be a small blister forming on the top of the toe.  Keep that covered as well and if it does open apply the ointment to that area as well.  Recheck in 1 week, sooner if any increased swelling, redness, soreness or other wounds.  Take care!

## 2022-10-17 NOTE — Progress Notes (Signed)
Subjective:  Patient ID: Daniel [name], male    DOB: 1951/01/03  Age: 72 y.o. MRN: [id number]  CC:  Chief Complaint  Patient presents with   Cellulitis    Pt notes he thinks the toe looks better     HPI Daniel [name] presents for   Toe ulcer/cellulitis Initial evaluation 6 days ago, head trimmed his nails 9 days prior, with possible wound at that time.  Increase in soreness 5 days prior and 3 days of redness and swelling.  History of diabetes with neuropathy.  Right third toe with swelling, erythema, soft tissue swelling into his foot with slight lower extremity erythema/edema.  Ulceration at the distal toe, see initial photos.  Bactroban ointment ordered for ulcer with wound care discussed, started on doxycycline for possible MRSA coverage and initial Rocephin 1 g.  Follow-up with my colleague 2 days ago,Dressing changes twice per day at that time, tolerating meds.  On photos erythema appeared to have been fading, stable.  Continued wound care and same antibiotic regimen.  Wound culture with Staph aureus, no sensitivities.  Toe feels better. No new side effects with abx. No fevers. Dressing change BID. Swelling in foot improving, and even improved in past 2 days.  No fever.   History 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   Trigger finger, left little finger 02/04/2020   Carpal tunnel syndrome, left upper limb 11/24/2019   Unilateral primary osteoarthritis, left knee 11/02/2019   Unilateral primary osteoarthritis, right knee 11/02/2019   Carpal tunnel syndrome, right upper limb 02/18/2018   Anxiety and depression 10/28/2016   Gastroesophageal reflux disease without esophagitis 10/28/2016   Seasonal allergic rhinitis due to pollen 10/28/2016   Primary osteoarthritis of left knee 07/25/2015   Essential hypertension 07/25/2015   Hypercholesteremia 07/25/2015   Type 2 diabetes mellitus with diabetic polyneuropathy,  without long-term current use of insulin 07/25/2015   Pollen allergies 02/10/2014   Allergic rhinitis due to animal (cat) (dog) hair and dander 09/15/2012   Extrinsic asthma 09/13/2011   Proteinuria 11/24/2010   Past Medical History:  Diagnosis Date   Allergic rhinitis    Allergy    Anxiety    Arthritis    Benign hypertension    Cataract    Depression    Diabetes mellitus    type 2    GERD (gastroesophageal reflux disease)    History of pneumonia    HLD (hyperlipidemia)    Neuromuscular disorder    pt denies at 8/19/visit    Pneumonia    hx of several times per pt - last episode 4-5 years ago    Past Surgical History:  Procedure Laterality Date   CARPAL TUNNEL RELEASE Bilateral 2020 Right, 2021 left   CATARACT EXTRACTION     bilateral   EYE SURGERY     KNEE SURGERY     right   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   Procedure: LEFT TOTAL KNEE ARTHROPLASTY;  Surgeon: Kathryne Hitch, MD;  Location: [facility];  Service: Orthopedics;  Laterality: Left;   Allergies  Allergen Reactions   Codeine Palpitations, Shortness Of Breath and Other (See Comments)   Pioglitazone Other (See Comments), Shortness Of Breath and Swelling    Blood sugar went up and down and sweating profusely   Prior to Admission medications   Medication Sig Start Date End Date Taking? Authorizing Provider  albuterol (VENTOLIN HFA) 108 (90 Base) MCG/ACT inhaler INHALE  2 PUFFS BY MOUTH EVERY 6 HOURS AS NEEDED FOR WHEEZING OR SHORTNESS OF BREATH 03/15/22  Yes Shade Flood, MD  aspirin EC 81 MG tablet Take by mouth. 10/17/13  Yes [provider]  Cholecalciferol (VITAMIN D) 125 MCG (5000 UT) CAPS Take 5,000 Units by mouth daily.   Yes [provider]  Cyanocobalamin (VITAMIN B 12 PO) Take 1,000 mg by mouth daily.    Yes [provider]  doxycycline (VIBRA-TABS) 100 MG tablet Take 1 tablet (100 mg total) by mouth 2 (two) times daily. 10/11/22  Yes Shade Flood, MD   ferrous sulfate 325 (65 FE) MG EC tablet Take 1 tablet (325 mg total) by mouth daily with breakfast. 10/05/21  Yes Armbruster, Willaim Rayas, MD  fluticasone (FLONASE) 50 MCG/ACT nasal spray Place 1 spray into both nostrils daily.   Yes [provider]  gabapentin (NEURONTIN) 300 MG capsule TAKE 1 TO 3 CAPSULES BY MOUTH TWICE DAILY AS NEEDED 08/22/22  Yes Shade Flood, MD  glimepiride (AMARYL) 4 MG tablet Take 1 tablet (4 mg total) by mouth 2 (two) times daily. 06/01/22  Yes Shade Flood, MD  glucose blood test strip Check glucose twice daily. 10/20/13  Yes [provider]  insulin glargine (LANTUS SOLOSTAR) 100 UNIT/ML Solostar Pen Inject 16 Units into the skin daily. 10/01/22  Yes Shade Flood, MD  Insulin Pen Needle (PEN NEEDLES) 32G X 4 MM MISC 1 Application by Does not apply route daily. 06/20/22  Yes Shade Flood, MD  Lancets [facility] DELICA PLUS LANCET33G) MISC USE 1 TO CHECK GLUCOSE ONCE DAILY 08/04/18  Yes [provider]  lisinopril (ZESTRIL) 20 MG tablet Take 1 tablet (20 mg total) by mouth daily. 03/15/22  Yes Shade Flood, MD  loratadine (CLARITIN) 10 MG tablet Take by mouth. 10/17/13  Yes [provider]  montelukast (SINGULAIR) 10 MG tablet Take 1 tablet (10 mg total) by mouth at bedtime. 03/15/22  Yes Shade Flood, MD  Multiple Vitamin (MULTIVITAMIN WITH MINERALS) TABS tablet Take 1 tablet by mouth daily.   Yes [provider]  mupirocin ointment (BACTROBAN) 2 % Apply 1 Application topically 2 (two) times daily. For 1 week 10/11/22  Yes Shade Flood, MD  ondansetron (ZOFRAN) 4 MG tablet Take 1 tablet (4 mg total) by mouth every 8 (eight) hours as needed for nausea or vomiting. 09/20/22  Yes Shade Flood, MD  [facility] ULTRA test strip USE 1 STRIP TO CHECK GLUCOSE ONCE DAILY 11/25/18  Yes Shade Flood, MD  pravastatin (PRAVACHOL) 80 MG tablet Take 1 tablet (80 mg total) by mouth daily. 08/31/22  Yes Shade Flood,  MD  sertraline (ZOLOFT) 100 MG tablet 1 tablet daily. 06/20/22  Yes Shade Flood, MD  sertraline (ZOLOFT) 50 MG tablet Take 1 tablet (50 mg total) by mouth daily. Total dose . 06/20/22  Yes Shade Flood, MD  traMADol (ULTRAM) 50 MG tablet Take 1 tablet (50 mg total) by mouth every 6 (six) hours as needed. 08/10/22  Yes Shade Flood, MD  vitamin E 180 MG (400 UNITS) capsule Take 400 Units by mouth daily.   Yes [provider]   Social History   Socioeconomic History   Marital status: Married    Spouse name: Not on file   Number of children: 3   Years of education: 16   Highest education level: Bachelor's degree (e.g., BA, AB, BS)  Occupational History   Occupation: tile  Surveyor, minerals, Optician, dispensing  Tobacco Use   Smoking status: Never   Smokeless tobacco: Never  Vaping Use   Vaping Use: Never used  Substance and Sexual Activity   Alcohol use: No   Drug use: No   Sexual activity: Not Currently  Other Topics Concern   Not on file  Social History Narrative   Caffiene decaff tea and coffee, occ diet soda.   Social Determinants of Health   Financial Resource Strain: Low Risk  (09/19/2022)   Overall Financial Resource Strain (CARDIA)    Difficulty of Paying Living Expenses: Not hard at all  Food Insecurity: No Food Insecurity (09/19/2022)   Hunger Vital Sign    Worried About Running Out of Food in the Last Year: Never true    Ran Out of Food in the Last Year: Never true  Transportation Needs: No Transportation Needs (09/19/2022)   PRAPARE - Administrator, Civil Service (Medical): No    Lack of Transportation (Non-Medical): No  Physical Activity: Insufficiently Active (09/19/2022)   Exercise Vital Sign    Days of Exercise per Week: 2 days    Minutes of Exercise per Session: 20 min  Stress: No Stress Concern Present (09/19/2022)   [employer] of Occupational Health - Occupational Stress Questionnaire    Feeling of Stress : Only a little  Social  Connections: Moderately Integrated (09/19/2022)   Social Connection and Isolation Panel [NHANES]    Frequency of Communication with Friends and Family: More than three times a week    Frequency of Social Gatherings with Friends and Family: Twice a week    Attends Religious Services: More than 4 times per year    Active Member of [employer] or Organizations: No    Attends Banker Meetings: Never    Marital Status: Married  Catering manager Violence: Not At Risk (02/15/2022)   Humiliation, Afraid, Rape, and Kick questionnaire    Fear of Current or Ex-Partner: No    Emotionally Abused: No    Physically Abused: No    Sexually Abused: No    Review of Systems   Objective:   Vitals:   10/17/22 0856  BP: 136/78  Pulse: (!) 59  Temp: 98.3 F (36.8 C)  TempSrc: Temporal  SpO2: 95%  Weight: 242 lb 6.4 oz (110 kg)  Height: 5\' 8"  (1.727 m)     Physical Exam Constitutional:      General: He is not in acute distress.    Appearance: Normal appearance. He is well-developed.  HENT:     Head: Normocephalic and atraumatic.  Cardiovascular:     Rate and Rhythm: Normal rate.  Pulmonary:     Effort: Pulmonary effort is normal.  Skin:    Comments: See photos - R 3rd toe. Small faint blister on dorsal 3rd toe - intact. 8mm healing dry ulcer. Foot nontender.   Neurological:     Mental Status: He is alert and oriented to person, place, and time.  Psychiatric:        Mood and Affect: Mood normal.       Assessment & Plan:  THAD OSORIA is a 72 y.o. male . Ulcer of toe of right foot, unspecified ulcer stage  Cellulitis in diabetic foot Cellulitis improving, tolerating antibiotic.  Sensitivities not present but even if this is MRSA he is on doxycycline which should be sufficient.  Ulcer seems to be dry, also improving.  Continue wound care discussed.  Does have a possible small blister on the  dorsum of that third toe, potentially from friction or bandage.  Blister care discussed,  keep clean, covered, recheck 1 week, sooner if any new or worsening symptoms.  No orders of the defined types were placed in this encounter.  Patient Instructions  Toe is improving. No change in meds.  There may be a small blister forming on the top of the toe.  Keep that covered as well and if it does open apply the ointment to that area as well.  Recheck in 1 week, sooner if any increased swelling, redness, soreness or other wounds.  Take care!    Signed,   Meredith Staggers, MD [facility], [facility] 10/17/22 9:45 AM

## 2022-10-24 ENCOUNTER — Encounter: Payer: Self-pay | Admitting: Family Medicine

## 2022-10-24 ENCOUNTER — Ambulatory Visit (INDEPENDENT_AMBULATORY_CARE_PROVIDER_SITE_OTHER): Payer: PPO | Admitting: Family Medicine

## 2022-10-24 VITALS — BP 128/66 | HR 77 | Temp 98.2°F | Ht 68.0 in | Wt 243.6 lb

## 2022-10-24 DIAGNOSIS — L03119 Cellulitis of unspecified part of limb: Secondary | ICD-10-CM

## 2022-10-24 DIAGNOSIS — R5383 Other fatigue: Secondary | ICD-10-CM

## 2022-10-24 DIAGNOSIS — L97519 Non-pressure chronic ulcer of other part of right foot with unspecified severity: Secondary | ICD-10-CM | POA: Diagnosis not present

## 2022-10-24 DIAGNOSIS — E11628 Type 2 diabetes mellitus with other skin complications: Secondary | ICD-10-CM | POA: Diagnosis not present

## 2022-10-24 DIAGNOSIS — E1142 Type 2 diabetes mellitus with diabetic polyneuropathy: Secondary | ICD-10-CM

## 2022-10-24 LAB — CBC
HCT: 36.4 % — ABNORMAL LOW (ref 39.0–52.0)
Hemoglobin: 11.9 g/dL — ABNORMAL LOW (ref 13.0–17.0)
MCHC: 32.6 g/dL (ref 30.0–36.0)
MCV: 90.5 fl (ref 78.0–100.0)
Platelets: 413 10*3/uL — ABNORMAL HIGH (ref 150.0–400.0)
RBC: 4.02 Mil/uL — ABNORMAL LOW (ref 4.22–5.81)
RDW: 13.1 % (ref 11.5–15.5)
WBC: 11.6 10*3/uL — ABNORMAL HIGH (ref 4.0–10.5)

## 2022-10-24 LAB — BASIC METABOLIC PANEL
BUN: 39 mg/dL — ABNORMAL HIGH (ref 6–23)
CO2: 26 mEq/L (ref 19–32)
Calcium: 9.3 mg/dL (ref 8.4–10.5)
Chloride: 99 mEq/L (ref 96–112)
Creatinine, Ser: 1.66 mg/dL — ABNORMAL HIGH (ref 0.40–1.50)
GFR: 40.55 mL/min — ABNORMAL LOW (ref >60.00)
Glucose, Bld: 249 mg/dL — ABNORMAL HIGH (ref 70–99)
Potassium: 5.1 mEq/L (ref 3.5–5.1)
Sodium: 133 mEq/L — ABNORMAL LOW (ref 135–145)

## 2022-10-24 NOTE — Patient Instructions (Addendum)
I will check some labs for fatigue. Increase water intake.  Increase lantus to 18 units for now until blood sugars start to improve or if any low readings. Let me know if fatigue is not improving in the next few days.   Ointment to toe ulcer until completely healed. Return to the clinic or go to the nearest emergency room if any of your symptoms worsen or new symptoms occur.  Take care!

## 2022-10-24 NOTE — Progress Notes (Signed)
Subjective:  Patient ID: Daniel [name], male    DOB: December 01, 1950  Age: 72 y.o. MRN: [id number]  CC:  Chief Complaint  Patient presents with   Foot Swelling    Pt states foot is doing much better  Pt states antibiotic takes all his energy      HPI Daniel [name] presents for   Right foot cellulitis, ulcer of third toe See prior visits.  History of diabetes with neuropathy.  Initial swelling into the foot, lower leg, treated with Bactroban ointment, Rocephin, doxycycline.  Has had continued improvement over previous visits, last visit 10/17/2022 -improving at that time.  Since last visit, further improvement.  Does have some fatigue, attributes to antibiotics. Noticed about 4 days ago. Fatigue past 4 days, but did some work on the deck few days ago. No chest pain. No fever. No vomiting. Urinating ok.  Hx of DM - above 200 past 5 mornings. Higher 100's during day, but down to 120 at times. Usually higher.  Currrently on 16 u lantus, glimepiride  BID.  No missed med doses. Eating/drinking ok - could drink some more water.   10-day course of doxycycline ordered on April 11. Last dose yesterday. Missed one dose prior.    History 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   Trigger finger, left little finger 02/04/2020   Carpal tunnel syndrome, left upper limb 11/24/2019   Unilateral primary osteoarthritis, left knee 11/02/2019   Unilateral primary osteoarthritis, right knee 11/02/2019   Carpal tunnel syndrome, right upper limb 02/18/2018   Anxiety and depression 10/28/2016   Gastroesophageal reflux disease without esophagitis 10/28/2016   Seasonal allergic rhinitis due to pollen 10/28/2016   Primary osteoarthritis of left knee 07/25/2015   Essential hypertension 07/25/2015   Hypercholesteremia 07/25/2015   Type 2 diabetes mellitus with diabetic polyneuropathy, without long-term current use of insulin 07/25/2015    Pollen allergies 02/10/2014   Allergic rhinitis due to animal (cat) (dog) hair and dander 09/15/2012   Extrinsic asthma 09/13/2011   Proteinuria 11/24/2010   Past Medical History:  Diagnosis Date   Allergic rhinitis    Allergy    Anxiety    Arthritis    Benign hypertension    Cataract    Depression    Diabetes mellitus    type 2    GERD (gastroesophageal reflux disease)    History of pneumonia    HLD (hyperlipidemia)    Neuromuscular disorder    pt denies at 8/19/visit    Pneumonia    hx of several times per pt - last episode 4-5 years ago    Past Surgical History:  Procedure Laterality Date   CARPAL TUNNEL RELEASE Bilateral 2020 Right, 2021 left   CATARACT EXTRACTION     bilateral   EYE SURGERY     KNEE SURGERY     right   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   Procedure: LEFT TOTAL KNEE ARTHROPLASTY;  Surgeon: Kathryne Hitch, MD;  Location: [facility];  Service: Orthopedics;  Laterality: Left;   Allergies  Allergen Reactions   Codeine Palpitations, Shortness Of Breath and Other (See Comments)   Pioglitazone Other (See Comments), Shortness Of Breath and Swelling    Blood sugar went up and down and sweating profusely   Prior to Admission medications   Medication Sig Start Date End Date Taking? Authorizing Provider  albuterol (VENTOLIN HFA) 108 (90 Base) MCG/ACT inhaler INHALE 2 PUFFS BY  MOUTH EVERY 6 HOURS AS NEEDED FOR WHEEZING OR SHORTNESS OF BREATH 03/15/22  Yes Shade Flood, MD  aspirin EC 81 MG tablet Take by mouth. 10/17/13  Yes [provider]  Cholecalciferol (VITAMIN D) 125 MCG (5000 UT) CAPS Take 5,000 Units by mouth daily.   Yes [provider]  Cyanocobalamin (VITAMIN B 12 PO) Take 1,000 mg by mouth daily.    Yes [provider]  doxycycline (VIBRA-TABS) 100 MG tablet Take 1 tablet (100 mg total) by mouth 2 (two) times daily. 10/11/22  Yes Shade Flood, MD  ferrous sulfate 325 (65 FE) MG EC tablet Take 1 tablet (325  mg total) by mouth daily with breakfast. 10/05/21  Yes Armbruster, Willaim Rayas, MD  fluticasone (FLONASE) 50 MCG/ACT nasal spray Place 1 spray into both nostrils daily.   Yes [provider]  gabapentin (NEURONTIN) 300 MG capsule TAKE 1 TO 3 CAPSULES BY MOUTH TWICE DAILY AS NEEDED 08/22/22  Yes Shade Flood, MD  glimepiride (AMARYL) 4 MG tablet Take 1 tablet (4 mg total) by mouth 2 (two) times daily. 06/01/22  Yes Shade Flood, MD  glucose blood test strip Check glucose twice daily. 10/20/13  Yes [provider]  insulin glargine (LANTUS SOLOSTAR) 100 UNIT/ML Solostar Pen Inject 16 Units into the skin daily. 10/01/22  Yes Shade Flood, MD  Insulin Pen Needle (PEN NEEDLES) 32G X 4 MM MISC 1 Application by Does not apply route daily. 06/20/22  Yes Shade Flood, MD  Lancets [facility] DELICA PLUS LANCET33G) MISC USE 1 TO CHECK GLUCOSE ONCE DAILY 08/04/18  Yes [provider]  lisinopril (ZESTRIL) 20 MG tablet Take 1 tablet (20 mg total) by mouth daily. 03/15/22  Yes Shade Flood, MD  loratadine (CLARITIN) 10 MG tablet Take by mouth. 10/17/13  Yes [provider]  montelukast (SINGULAIR) 10 MG tablet Take 1 tablet (10 mg total) by mouth at bedtime. 03/15/22  Yes Shade Flood, MD  Multiple Vitamin (MULTIVITAMIN WITH MINERALS) TABS tablet Take 1 tablet by mouth daily.   Yes [provider]  mupirocin ointment (BACTROBAN) 2 % Apply 1 Application topically 2 (two) times daily. For 1 week 10/11/22  Yes Shade Flood, MD  ondansetron (ZOFRAN) 4 MG tablet Take 1 tablet (4 mg total) by mouth every 8 (eight) hours as needed for nausea or vomiting. 09/20/22  Yes Shade Flood, MD  [facility] ULTRA test strip USE 1 STRIP TO CHECK GLUCOSE ONCE DAILY 11/25/18  Yes Shade Flood, MD  pravastatin (PRAVACHOL) 80 MG tablet Take 1 tablet (80 mg total) by mouth daily. 08/31/22  Yes Shade Flood, MD  sertraline (ZOLOFT) 100 MG tablet 1 tablet daily.  06/20/22  Yes Shade Flood, MD  sertraline (ZOLOFT) 50 MG tablet Take 1 tablet (50 mg total) by mouth daily. Total dose 150mg . 06/20/22  Yes Shade Flood, MD  traMADol (ULTRAM) 50 MG tablet Take 1 tablet (50 mg total) by mouth every 6 (six) hours as needed. 08/10/22  Yes Shade Flood, MD  vitamin E 180 MG (400 UNITS) capsule Take 400 Units by mouth daily.   Yes [provider]   Social History   Socioeconomic History   Marital status: Married    Spouse name: Not on file   Number of children: 3   Years of education: 16   Highest education level: Bachelor's degree (e.g., BA, AB, BS)  Occupational History   Occupation: Photographer, Optician, dispensing  Tobacco Use   Smoking status: Never   Smokeless tobacco: Never  Vaping Use   Vaping Use: Never used  Substance and Sexual Activity   Alcohol use: No   Drug use: No   Sexual activity: Not Currently  Other Topics Concern   Not on file  Social History Narrative   Caffiene decaff tea and coffee, occ diet soda.   Social Determinants of Health   Financial Resource Strain: Low Risk  (09/19/2022)   Overall Financial Resource Strain (CARDIA)    Difficulty of Paying Living Expenses: Not hard at all  Food Insecurity: No Food Insecurity (09/19/2022)   Hunger Vital Sign    Worried About Running Out of Food in the Last Year: Never true    Ran Out of Food in the Last Year: Never true  Transportation Needs: No Transportation Needs (09/19/2022)   PRAPARE - Administrator, Civil Service (Medical): No    Lack of Transportation (Non-Medical): No  Physical Activity: Insufficiently Active (09/19/2022)   Exercise Vital Sign    Days of Exercise per Week: 2 days    Minutes of Exercise per Session: 20 min  Stress: No Stress Concern Present (09/19/2022)   [employer] of Occupational Health - Occupational Stress Questionnaire    Feeling of Stress : Only a little  Social Connections: Moderately Integrated (09/19/2022)    Social Connection and Isolation Panel [NHANES]    Frequency of Communication with Friends and Family: More than three times a week    Frequency of Social Gatherings with Friends and Family: Twice a week    Attends Religious Services: More than 4 times per year    Active Member of [employer] or Organizations: No    Attends Banker Meetings: Never    Marital Status: Married  Catering manager Violence: Not At Risk (02/15/2022)   Humiliation, Afraid, Rape, and Kick questionnaire    Fear of Current or Ex-Partner: No    Emotionally Abused: No    Physically Abused: No    Sexually Abused: No    Review of Systems   Objective:   Vitals:   10/24/22 0853  BP: 128/66  Pulse: 77  Temp: 98.2 F (36.8 C)  TempSrc: Temporal  SpO2: 97%  Weight: 243 lb 9.6 oz (110.5 kg)  Height: 5\' 8"  (1.727 m)     Physical Exam Vitals reviewed.  Constitutional:      Appearance: He is well-developed.  HENT:     Head: Normocephalic and atraumatic.  Neck:     Vascular: No carotid bruit or JVD.  Cardiovascular:     Rate and Rhythm: Normal rate and regular rhythm.     Heart sounds: Normal heart sounds. No murmur heard. Pulmonary:     Effort: Pulmonary effort is normal.     Breath sounds: Normal breath sounds. No rales.  Musculoskeletal:     Right lower leg: No edema.     Left lower leg: No edema.  Skin:    General: Skin is warm and dry.     Comments: See photos of right foot, third toe, very faint ulcer without sign of infection minimal residual erythema of the toe but significantly improved.  Nontender.  Neurological:     Mental Status: He is alert and oriented to person, place, and time.  Psychiatric:        Mood and Affect: Mood normal.          Assessment & Plan:  Daniel [name] is a 72 y.o.  male . Type 2 diabetes mellitus with diabetic polyneuropathy, without long-term current use of insulin - Plan: Basic metabolic panel Fatigue, unspecified type - Plan: Basic metabolic panel,  CBC  -Fatigue may be multifactorial with increased work at home, possible decreased fluid intake, and hyperglycemia with diabetes.  Less likely medication side effect.  Has completed doxycycline course regardless.  -Increase Lantus by 2 units for now, watch for hypoglycemia and can decrease back to 16 units when improved control.  -Check BMP with history of CKD/elevated creatinine.  Increase fluid intake.  -Check CBC with fatigue, and RTC precautions given.  Ulcer of toe of right foot, unspecified ulcer stage - Plan: CBC Cellulitis in diabetic foot - Plan: CBC  -Improved, has completed antibiotics as above.  Ulcer is nearly healed, continue Bactroban ointment, keep area clean until completely healed.  RTC precautions. No orders of the defined types were placed in this encounter.  Patient Instructions  I will check some labs for fatigue. Increase water intake.  Increase lantus to 18 units for now until blood sugars start to improve or if any low readings. Let me know if fatigue is not improving in the next few days.   Ointment to toe ulcer until completely healed. Return to the clinic or go to the nearest emergency room if any of your symptoms worsen or new symptoms occur.  Take care!        Signed,   Meredith Staggers, MD [facility], [facility] 10/24/22 9:43 AM

## 2022-10-26 ENCOUNTER — Other Ambulatory Visit: Payer: Self-pay

## 2022-10-26 ENCOUNTER — Other Ambulatory Visit: Payer: Self-pay | Admitting: Family Medicine

## 2022-10-26 DIAGNOSIS — E1142 Type 2 diabetes mellitus with diabetic polyneuropathy: Secondary | ICD-10-CM

## 2022-10-26 MED ORDER — GABAPENTIN 300 MG PO CAPS
ORAL_CAPSULE | ORAL | 0 refills | Status: DC
Start: 2022-10-26 — End: 2022-12-24

## 2022-10-26 NOTE — Telephone Encounter (Signed)
Patient is requesting a refill of the following medications: Requested Prescriptions   Pending Prescriptions Disp Refills   gabapentin (NEURONTIN) 300 MG capsule [Pharmacy Med Name: Gabapentin 300 MG Oral Capsule] 180 capsule 0    Sig: TAKE 1 TO 3 CAPSULES BY MOUTH TWICE DAILY AS NEEDED    Date of patient request: 10/26/2022 Last office visit: 10/24/2022 Follow up time period per chart: 12/24/2022   [facility] [employer]  P:[phone]

## 2022-11-15 ENCOUNTER — Other Ambulatory Visit: Payer: Self-pay | Admitting: Family Medicine

## 2022-11-15 DIAGNOSIS — G8929 Other chronic pain: Secondary | ICD-10-CM

## 2022-11-15 NOTE — Telephone Encounter (Signed)
Tramadol 50 mg LOV: 10/24/22 Last Refill:2/92/4 Upcoming appt: 12/24/22

## 2022-11-15 NOTE — Telephone Encounter (Signed)
Controlled substance database reviewed.  Gabapentin filled 10/26/2022, tramadol last filled 10/03/2022, refill ordered.

## 2022-11-19 DIAGNOSIS — N3941 Urge incontinence: Secondary | ICD-10-CM | POA: Diagnosis not present

## 2022-11-19 DIAGNOSIS — R350 Frequency of micturition: Secondary | ICD-10-CM | POA: Diagnosis not present

## 2022-11-19 DIAGNOSIS — N500 Atrophy of testis: Secondary | ICD-10-CM | POA: Diagnosis not present

## 2022-11-27 DIAGNOSIS — E119 Type 2 diabetes mellitus without complications: Secondary | ICD-10-CM | POA: Diagnosis not present

## 2022-11-27 DIAGNOSIS — E349 Endocrine disorder, unspecified: Secondary | ICD-10-CM | POA: Diagnosis not present

## 2022-11-27 LAB — HM DIABETES EYE EXAM

## 2022-11-29 ENCOUNTER — Other Ambulatory Visit: Payer: Self-pay | Admitting: Urology

## 2022-11-29 ENCOUNTER — Encounter: Payer: Self-pay | Admitting: Urology

## 2022-11-29 DIAGNOSIS — E221 Hyperprolactinemia: Secondary | ICD-10-CM

## 2022-11-29 DIAGNOSIS — N3941 Urge incontinence: Secondary | ICD-10-CM | POA: Diagnosis not present

## 2022-11-29 DIAGNOSIS — E230 Hypopituitarism: Secondary | ICD-10-CM

## 2022-12-16 ENCOUNTER — Encounter: Payer: Self-pay | Admitting: Family Medicine

## 2022-12-16 DIAGNOSIS — E1142 Type 2 diabetes mellitus with diabetic polyneuropathy: Secondary | ICD-10-CM

## 2022-12-17 MED ORDER — LANTUS SOLOSTAR 100 UNIT/ML ~~LOC~~ SOPN
18.0000 [IU] | PEN_INJECTOR | Freq: Every day | SUBCUTANEOUS | 3 refills | Status: DC
Start: 2022-12-17 — End: 2023-04-18

## 2022-12-17 NOTE — Telephone Encounter (Signed)
Patient notes increased insulin to 20 units daily to get readings into 90s but is now out early, please advise if this is okay to increase to.

## 2022-12-24 ENCOUNTER — Ambulatory Visit (INDEPENDENT_AMBULATORY_CARE_PROVIDER_SITE_OTHER): Payer: PPO | Admitting: Family Medicine

## 2022-12-24 VITALS — BP 118/66 | HR 84 | Temp 97.8°F | Ht 68.0 in | Wt 234.0 lb

## 2022-12-24 DIAGNOSIS — Z7984 Long term (current) use of oral hypoglycemic drugs: Secondary | ICD-10-CM | POA: Diagnosis not present

## 2022-12-24 DIAGNOSIS — J4520 Mild intermittent asthma, uncomplicated: Secondary | ICD-10-CM

## 2022-12-24 DIAGNOSIS — G8929 Other chronic pain: Secondary | ICD-10-CM | POA: Diagnosis not present

## 2022-12-24 DIAGNOSIS — E291 Testicular hypofunction: Secondary | ICD-10-CM | POA: Diagnosis not present

## 2022-12-24 DIAGNOSIS — I10 Essential (primary) hypertension: Secondary | ICD-10-CM | POA: Diagnosis not present

## 2022-12-24 DIAGNOSIS — E7800 Pure hypercholesterolemia, unspecified: Secondary | ICD-10-CM

## 2022-12-24 DIAGNOSIS — F32A Depression, unspecified: Secondary | ICD-10-CM

## 2022-12-24 DIAGNOSIS — G2581 Restless legs syndrome: Secondary | ICD-10-CM | POA: Diagnosis not present

## 2022-12-24 DIAGNOSIS — M25562 Pain in left knee: Secondary | ICD-10-CM | POA: Diagnosis not present

## 2022-12-24 DIAGNOSIS — M25561 Pain in right knee: Secondary | ICD-10-CM | POA: Diagnosis not present

## 2022-12-24 DIAGNOSIS — E1142 Type 2 diabetes mellitus with diabetic polyneuropathy: Secondary | ICD-10-CM | POA: Diagnosis not present

## 2022-12-24 LAB — COMPREHENSIVE METABOLIC PANEL
ALT: 15 U/L (ref 0–53)
AST: 15 U/L (ref 0–37)
Albumin: 4.3 g/dL (ref 3.5–5.2)
Alkaline Phosphatase: 83 U/L (ref 39–117)
BUN: 29 mg/dL — ABNORMAL HIGH (ref 6–23)
CO2: 29 mEq/L (ref 19–32)
Calcium: 10 mg/dL (ref 8.4–10.5)
Chloride: 99 mEq/L (ref 96–112)
Creatinine, Ser: 1.58 mg/dL — ABNORMAL HIGH (ref 0.40–1.50)
GFR: 42.97 mL/min — ABNORMAL LOW (ref >60.00)
Glucose, Bld: 198 mg/dL — ABNORMAL HIGH (ref 70–99)
Potassium: 4.5 mEq/L (ref 3.5–5.1)
Sodium: 136 mEq/L (ref 135–145)
Total Bilirubin: 0.3 mg/dL (ref 0.2–1.2)
Total Protein: 7.7 g/dL (ref 6.0–8.3)

## 2022-12-24 LAB — MICROALBUMIN / CREATININE URINE RATIO
Creatinine,U: 151 mg/dL
Microalb Creat Ratio: 2.5 mg/g (ref 0.0–30.0)
Microalb, Ur: 3.8 mg/dL — ABNORMAL HIGH (ref 0.0–1.9)

## 2022-12-24 LAB — LIPID PANEL
Cholesterol: 216 mg/dL — ABNORMAL HIGH (ref 0–200)
HDL: 52.6 mg/dL (ref >39.00)
LDL Cholesterol: 132 mg/dL — ABNORMAL HIGH (ref 0–99)
NonHDL: 163.4
Total CHOL/HDL Ratio: 4
Triglycerides: 158 mg/dL — ABNORMAL HIGH (ref 0.0–149.0)
VLDL: 31.6 mg/dL (ref 0.0–40.0)

## 2022-12-24 LAB — HEMOGLOBIN A1C: Hgb A1c MFr Bld: 9.7 % — ABNORMAL HIGH (ref 4.6–6.5)

## 2022-12-24 MED ORDER — GLIMEPIRIDE 4 MG PO TABS: 4.0000 mg | ORAL_TABLET | Freq: Two times a day (BID) | ORAL | 1 refills | Status: DC

## 2022-12-24 MED ORDER — LISINOPRIL 20 MG PO TABS: 20.0000 mg | ORAL_TABLET | Freq: Every day | ORAL | 3 refills | Status: DC

## 2022-12-24 MED ORDER — GABAPENTIN 300 MG PO CAPS
ORAL_CAPSULE | ORAL | 0 refills | Status: DC
Start: 2022-12-24 — End: 2023-02-12

## 2022-12-24 MED ORDER — MONTELUKAST SODIUM 10 MG PO TABS: 10.0000 mg | ORAL_TABLET | Freq: Every day | ORAL | 2 refills | Status: DC

## 2022-12-24 MED ORDER — SERTRALINE HCL 100 MG PO TABS: 200.0000 mg | ORAL_TABLET | Freq: Every day | ORAL | 1 refills | Status: DC

## 2022-12-24 MED ORDER — PRAVASTATIN SODIUM 80 MG PO TABS: 80.0000 mg | ORAL_TABLET | Freq: Every day | ORAL | 1 refills | Status: DC

## 2022-12-24 NOTE — Progress Notes (Unsigned)
Subjective:  Patient ID: Daniel [name], male    DOB: 28-Jul-1950  Age: 72 y.o. MRN: [id number]  CC:  Chief Complaint  Patient presents with   Medical Management of Chronic Issues    Pt notes sometimes will have odd highs in the am without explanation, notes most numbers are good, are we going to continue gabapentin, otherwise well     HPI Daniel [name] presents for   Diabetes: With neuropathy, hyperglycemia, prior elevated creatinine.  Started on insulin in December 2023.  Dosage adjustment based on home readings and some borderline lows.  Off metformin due to elevated creatinine and nephrology recommendation.  Additionally stopped HCTZ, PPI and avoidance of other nephrotoxins including NSAIDs.  Was continued on glimepiride 4 mg twice daily, and gabapentin 300 mg 1-3 tabs twice daily for his neuropathic symptoms and restless leg syndrome.  Taking 3 in am, 3 at night, occasionally 1 in afternoon. Also takes glimepiride 4mg  bid.  elevated A1c in March, dosage has increased and most recently has been taking 20 units per day past few weeks. Ran out for 4 days. He is on statin with pravastatin 80 mg daily, ACE inhibitor with lisinopril 20 mg daily. Fasting: usually 95-115, rare spike - 211 this morning.  Postprandial: 160-200.  Symptomatic lows: none Microalbumin: Normal ratio 11/30/2021.  Repeat today Optho, foot exam, pneumovax:  Optho - month ago   Lab Results  Component Value Date   HGBA1C 10.2 (H) 09/20/2022   HGBA1C 8.8 (H) 06/20/2022   HGBA1C 8.3 (H) 03/15/2022   Lab Results  Component Value Date   MICROALBUR 1.4 11/30/2021   LDLCALC 72 03/15/2022   CREATININE 1.66 (H) 10/24/2022    Chronic knee pain Surgery deferred previously.  We have been managing with tramadol 2/day to help with pain and legs as well as RLS, avoiding NSAIDs for reasons above.Controlled substance database reviewed.  Tramadol No. 1 20 last filled on 11/16/2022.  Previously 10/03/2022.  Gabapentin last filled  #180 on 10/26/2022. Takes 2-3 tramadol per day, rare need for 4.   Hypogonadism: Seen by urology - Dr. Annabell Howells.  Plans to start testosterone cream - delaying until after grandchildren visit.   Depression: Feels like more on edge, quicker to respond, feeling more anxious. On 150mg  zoloft at this time. Same dose for awhile. Plan to start testosterone but would like to adjust meds.        12/24/2022    8:19 AM 08/22/2022    8:22 AM 06/20/2022    8:18 AM 06/18/2022    9:00 AM 06/01/2022   10:51 AM  Depression screen PHQ 2/9  Decreased Interest 0 0 0 0 0  Down, Depressed, Hopeless 0 0 0 0 0  PHQ - 2 Score 0 0 0 0 0  Altered sleeping 1 1 0  1  Tired, decreased energy 3 1 0  1  Change in appetite 1 0 0  0  Feeling bad or failure about yourself  0 0 0  0  Trouble concentrating 0 0 0  0  Moving slowly or fidgety/restless 0 0 0  0  Suicidal thoughts 0 0 0  0  PHQ-9 Score 5 2 0  2   Allergy/asthma stable with singulair, claritin.    Hypertension: With ckd, seen by nephrology. On chlorthalidone and lisinopril. No new med side effects.  BP Readings from Last 3 Encounters:  12/24/22 118/66  10/24/22 128/66  10/17/22 136/78   Lab Results  Component Value Date  CREATININE 1.66 (H) 10/24/2022    Hyperlipidemia: Pravastatin 80mg  every day. No new myalgia/side effects.  Lab Results  Component Value Date   CHOL 149 03/15/2022   HDL 55.00 03/15/2022   LDLCALC 72 03/15/2022   TRIG 114.0 03/15/2022   CHOLHDL 3 03/15/2022   Lab Results  Component Value Date   ALT 13 09/20/2022   AST 14 09/20/2022   ALKPHOS 84 09/20/2022   BILITOT 0.3 09/20/2022      History 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   Trigger finger, left little finger 02/04/2020   Carpal tunnel syndrome, left upper limb 11/24/2019   Unilateral primary osteoarthritis, left knee 11/02/2019   Unilateral primary osteoarthritis, right knee 11/02/2019    Carpal tunnel syndrome, right upper limb 02/18/2018   Anxiety and depression 10/28/2016   Gastroesophageal reflux disease without esophagitis 10/28/2016   Seasonal allergic rhinitis due to pollen 10/28/2016   Primary osteoarthritis of left knee 07/25/2015   Essential hypertension 07/25/2015   Hypercholesteremia 07/25/2015   Type 2 diabetes mellitus with diabetic polyneuropathy, without long-term current use of insulin (HCC) 07/25/2015   Pollen allergies 02/10/2014   Allergic rhinitis due to animal (cat) (dog) hair and dander 09/15/2012   Extrinsic asthma 09/13/2011   Proteinuria 11/24/2010   Past Medical History:  Diagnosis Date   Allergic rhinitis    Allergy    Anxiety    Arthritis    Benign hypertension    Cataract    Depression    Diabetes mellitus (HCC)    type 2    GERD (gastroesophageal reflux disease)    History of pneumonia    HLD (hyperlipidemia)    Neuromuscular disorder (HCC)    pt denies at 8/19/visit    Pneumonia    hx of several times per pt - last episode 4-5 years ago    Past Surgical History:  Procedure Laterality Date   CARPAL TUNNEL RELEASE Bilateral 2020 Right, 2021 left   CATARACT EXTRACTION     bilateral   EYE SURGERY     KNEE SURGERY     right   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   Procedure: LEFT TOTAL KNEE ARTHROPLASTY;  Surgeon: Kathryne Hitch, MD;  Location: [facility];  Service: Orthopedics;  Laterality: Left;   Allergies  Allergen Reactions   Codeine Palpitations, Shortness Of Breath and Other (See Comments)   Pioglitazone Other (See Comments), Shortness Of Breath and Swelling    Blood sugar went up and down and sweating profusely   Prior to Admission medications   Medication Sig Start Date End Date Taking? Authorizing Provider  albuterol (VENTOLIN HFA) 108 (90 Base) MCG/ACT inhaler INHALE 2 PUFFS BY MOUTH EVERY 6 HOURS AS NEEDED FOR WHEEZING OR SHORTNESS OF BREATH 03/15/22  Yes Shade Flood, MD  aspirin EC 81 MG tablet  Take by mouth. 10/17/13  Yes [provider]  Cholecalciferol (VITAMIN D) 125 MCG (5000 UT) CAPS Take 5,000 Units by mouth daily.   Yes [provider]  Cyanocobalamin (VITAMIN B 12 PO) Take 1,000 mg by mouth daily.    Yes [provider]  doxycycline (VIBRA-TABS) 100 MG tablet Take 1 tablet (100 mg total) by mouth 2 (two) times daily. 10/11/22  Yes Shade Flood, MD  ferrous sulfate 325 (65 FE) MG EC tablet Take 1 tablet (325 mg total) by mouth daily with breakfast. 10/05/21  Yes Armbruster, Willaim Rayas, MD  fluticasone (FLONASE) 50  MCG/ACT nasal spray Place 1 spray into both nostrils daily.   Yes [provider]  gabapentin (NEURONTIN) 300 MG capsule TAKE 1 TO 3 CAPSULES BY MOUTH TWICE DAILY AS NEEDED 10/26/22  Yes Shade Flood, MD  gabapentin (NEURONTIN) 300 MG capsule TAKE 1 TO 3 CAPSULES BY MOUTH TWICE DAILY AS NEEDED 10/26/22  Yes Shade Flood, MD  glimepiride (AMARYL) 4 MG tablet Take 1 tablet (4 mg total) by mouth 2 (two) times daily. 06/01/22  Yes Shade Flood, MD  glucose blood test strip Check glucose twice daily. 10/20/13  Yes [provider]  insulin glargine (LANTUS SOLOSTAR) 100 UNIT/ML Solostar Pen Inject 18-20 Units into the skin daily. 12/17/22  Yes Shade Flood, MD  Insulin Pen Needle (PEN NEEDLES) 32G X 4 MM MISC 1 Application by Does not apply route daily. 06/20/22  Yes Shade Flood, MD  Lancets [facility] DELICA PLUS LANCET33G) MISC USE 1 TO CHECK GLUCOSE ONCE DAILY 08/04/18  Yes [provider]  lisinopril (ZESTRIL) 20 MG tablet Take 1 tablet (20 mg total) by mouth daily. 03/15/22  Yes Shade Flood, MD  loratadine (CLARITIN) 10 MG tablet Take by mouth. 10/17/13  Yes [provider]  montelukast (SINGULAIR) 10 MG tablet Take 1 tablet (10 mg total) by mouth at bedtime. 03/15/22  Yes Shade Flood, MD  Multiple Vitamin (MULTIVITAMIN WITH MINERALS) TABS tablet Take 1 tablet by mouth daily.   Yes  [provider]  mupirocin ointment (BACTROBAN) 2 % Apply 1 Application topically 2 (two) times daily. For 1 week 10/11/22  Yes Shade Flood, MD  ondansetron (ZOFRAN) 4 MG tablet Take 1 tablet (4 mg total) by mouth every 8 (eight) hours as needed for nausea or vomiting. 09/20/22  Yes Shade Flood, MD  [facility] ULTRA test strip USE 1 STRIP TO CHECK GLUCOSE ONCE DAILY 11/25/18  Yes Shade Flood, MD  pravastatin (PRAVACHOL) 80 MG tablet Take 1 tablet (80 mg total) by mouth daily. 08/31/22  Yes Shade Flood, MD  sertraline (ZOLOFT) 100 MG tablet 1 tablet daily. 06/20/22  Yes Shade Flood, MD  sertraline (ZOLOFT) 50 MG tablet Take 1 tablet (50 mg total) by mouth daily. Total dose 150mg . 06/20/22  Yes Shade Flood, MD  Testosterone 1.62 % GEL Apply 1 Application topically daily. 11/29/22  Yes [provider]  traMADol (ULTRAM) 50 MG tablet TAKE 1 TABLET BY MOUTH EVERY 6 HOURS AS NEEDED 11/15/22  Yes Shade Flood, MD  vitamin E 180 MG (400 UNITS) capsule Take 400 Units by mouth daily.   Yes [provider]   Social History   Socioeconomic History   Marital status: Married    Spouse name: Not on file   Number of children: 3   Years of education: 16   Highest education level: Bachelor's degree (e.g., BA, AB, BS)  Occupational History   Occupation: Photographer, Optician, dispensing  Tobacco Use   Smoking status: Never   Smokeless tobacco: Never  Vaping Use   Vaping Use: Never used  Substance and Sexual Activity   Alcohol use: No   Drug use: No   Sexual activity: Not Currently  Other Topics Concern   Not on file  Social History Narrative   Caffiene decaff tea and coffee, occ diet soda.   Social Determinants of Health   Financial Resource Strain: Low Risk  (09/19/2022)   Overall Financial Resource Strain (CARDIA)    Difficulty of Paying Living Expenses:  Not hard at all  Food Insecurity: No Food Insecurity (09/19/2022)   Hunger Vital Sign     Worried About Running Out of Food in the Last Year: Never true    Ran Out of Food in the Last Year: Never true  Transportation Needs: No Transportation Needs (09/19/2022)   PRAPARE - Administrator, Civil Service (Medical): No    Lack of Transportation (Non-Medical): No  Physical Activity: Insufficiently Active (09/19/2022)   Exercise Vital Sign    Days of Exercise per Week: 2 days    Minutes of Exercise per Session: 20 min  Stress: No Stress Concern Present (09/19/2022)   [employer] of Occupational Health - Occupational Stress Questionnaire    Feeling of Stress : Only a little  Social Connections: Moderately Integrated (09/19/2022)   Social Connection and Isolation Panel [NHANES]    Frequency of Communication with Friends and Family: More than three times a week    Frequency of Social Gatherings with Friends and Family: Twice a week    Attends Religious Services: More than 4 times per year    Active Member of [employer] or Organizations: No    Attends Banker Meetings: Never    Marital Status: Married  Catering manager Violence: Not At Risk (02/15/2022)   Humiliation, Afraid, Rape, and Kick questionnaire    Fear of Current or Ex-Partner: No    Emotionally Abused: No    Physically Abused: No    Sexually Abused: No    Review of Systems  Per HPI.  Objective:   Vitals:   12/24/22 0819  BP: 118/66  Pulse: 84  Temp: 97.8 F (36.6 C)  TempSrc: Temporal  SpO2: 96%  Weight: 234 lb (106.1 kg)  Height: 5\' 8"  (1.727 m)     Physical Exam Vitals reviewed.  Constitutional:      Appearance: He is well-developed.  HENT:     Head: Normocephalic and atraumatic.  Neck:     Vascular: No carotid bruit or JVD.  Cardiovascular:     Rate and Rhythm: Normal rate and regular rhythm.     Heart sounds: Normal heart sounds. No murmur heard. Pulmonary:     Effort: Pulmonary effort is normal.     Breath sounds: Normal breath sounds. No rales.  Musculoskeletal:      Right lower leg: No edema.     Left lower leg: No edema.  Skin:    General: Skin is warm and dry.  Neurological:     Mental Status: He is alert and oriented to person, place, and time.  Psychiatric:        Mood and Affect: Mood normal.        Assessment & Plan:  RUSTIN ERHART is a 72 y.o. male . Chronic pain of both knees  Type 2 diabetes mellitus with diabetic polyneuropathy, without long-term current use of insulin (HCC) - Plan: gabapentin (NEURONTIN) 300 MG capsule, Microalbumin / creatinine urine ratio, Hemoglobin A1c, glimepiride (AMARYL) 4 MG tablet  Essential hypertension - Plan: lisinopril (ZESTRIL) 20 MG tablet  Restless leg syndrome  Mild intermittent asthma without complication - Plan: montelukast (SINGULAIR) 10 MG tablet  Depression, unspecified depression type - Plan: sertraline (ZOLOFT) 100 MG tablet  Hypogonadism male  Hypercholesteremia - Plan: Comprehensive metabolic panel, Lipid panel, pravastatin (PRAVACHOL) 80 MG tablet   Meds ordered this encounter  Medications   gabapentin (NEURONTIN) 300 MG capsule    Sig: TAKE 1 TO 3 CAPSULES BY MOUTH TWICE DAILY AS  NEEDED, with additional dose in afternoon if needed.    Dispense:  210 capsule    Refill:  0   glimepiride (AMARYL) 4 MG tablet    Sig: Take 1 tablet (4 mg total) by mouth 2 (two) times daily.    Dispense:  180 tablet    Refill:  1   lisinopril (ZESTRIL) 20 MG tablet    Sig: Take 1 tablet (20 mg total) by mouth daily.    Dispense:  90 tablet    Refill:  3   montelukast (SINGULAIR) 10 MG tablet    Sig: Take 1 tablet (10 mg total) by mouth at bedtime.    Dispense:  90 tablet    Refill:  2   pravastatin (PRAVACHOL) 80 MG tablet    Sig: Take 1 tablet (80 mg total) by mouth daily.    Dispense:  90 tablet    Refill:  1   sertraline (ZOLOFT) 100 MG tablet    Sig: Take 2 tablets (200 mg total) by mouth daily. 2 tablets daily.    Dispense:  180 tablet    Refill:  1   Patient Instructions  Try  increasing sertraline to 200 mg/day.  I think this plus the start of testosterone should help with your symptoms but follow-up if not seeing some improvement in the next 4 to 6 weeks, sooner if needed.  No other med changes today.  I will let you know if there are concerns on labs and if further adjustments on your diabetes meds are needed.  Take care.       Signed,   Meredith Staggers, MD [facility], [facility] 12/24/22 12:20 PM

## 2022-12-24 NOTE — Patient Instructions (Signed)
Try increasing sertraline to 200 mg/day.  I think this plus the start of testosterone should help with your symptoms but follow-up if not seeing some improvement in the next 4 to 6 weeks, sooner if needed.  No other med changes today.  I will let you know if there are concerns on labs and if further adjustments on your diabetes meds are needed.  Take care.

## 2022-12-25 ENCOUNTER — Encounter: Payer: Self-pay | Admitting: Family Medicine

## 2023-01-01 DIAGNOSIS — N3941 Urge incontinence: Secondary | ICD-10-CM | POA: Diagnosis not present

## 2023-01-01 DIAGNOSIS — R350 Frequency of micturition: Secondary | ICD-10-CM | POA: Diagnosis not present

## 2023-01-01 DIAGNOSIS — R351 Nocturia: Secondary | ICD-10-CM | POA: Diagnosis not present

## 2023-01-01 DIAGNOSIS — E230 Hypopituitarism: Secondary | ICD-10-CM | POA: Diagnosis not present

## 2023-01-10 DIAGNOSIS — E230 Hypopituitarism: Secondary | ICD-10-CM | POA: Diagnosis not present

## 2023-01-10 DIAGNOSIS — Z125 Encounter for screening for malignant neoplasm of prostate: Secondary | ICD-10-CM | POA: Diagnosis not present

## 2023-01-11 ENCOUNTER — Other Ambulatory Visit: Payer: Self-pay | Admitting: Family Medicine

## 2023-01-11 DIAGNOSIS — G8929 Other chronic pain: Secondary | ICD-10-CM

## 2023-01-11 NOTE — Telephone Encounter (Signed)
Tramadol 50 mg LOV: 12/24/22 Last Refill:11/15/22 Upcoming appt: 02/28/23  Pt is a [name] pt

## 2023-01-12 ENCOUNTER — Ambulatory Visit
Admission: RE | Admit: 2023-01-12 | Discharge: 2023-01-12 | Disposition: A | Discharge status: Home / Self Care | Payer: PPO | Source: Ambulatory Visit | Attending: Urology | Admitting: Urology

## 2023-01-12 DIAGNOSIS — E230 Hypopituitarism: Secondary | ICD-10-CM | POA: Diagnosis not present

## 2023-01-12 DIAGNOSIS — E221 Hyperprolactinemia: Secondary | ICD-10-CM

## 2023-01-12 MED ORDER — GADOPICLENOL 0.5 MMOL/ML IV SOLN
10.0000 mL | Freq: Once | INTRAVENOUS | Status: AC | PRN
  Administered 2023-01-12: 10 mL via INTRAVENOUS

## 2023-01-16 DIAGNOSIS — H524 Presbyopia: Secondary | ICD-10-CM | POA: Diagnosis not present

## 2023-01-16 DIAGNOSIS — H40013 Open angle with borderline findings, low risk, bilateral: Secondary | ICD-10-CM | POA: Diagnosis not present

## 2023-01-22 ENCOUNTER — Telehealth: Payer: Self-pay | Admitting: Family Medicine

## 2023-01-22 DIAGNOSIS — D352 Benign neoplasm of pituitary gland: Secondary | ICD-10-CM

## 2023-01-22 NOTE — Telephone Encounter (Signed)
Note received from urologist.  Elevated prolactin level with evaluation of hypogonadism/low testosterone.  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 Mass effect on the optic chiasm and left-greater-than-right cisternal segments of the optic nerves.   Will order referral to neurosurgery and endocrinology.  Plan discussed with patient. No diplopia, repeat eye exam last week - new rx planned. No questions at this time.

## 2023-01-25 DIAGNOSIS — D497 Neoplasm of unspecified behavior of endocrine glands and other parts of nervous system: Secondary | ICD-10-CM | POA: Diagnosis not present

## 2023-01-25 DIAGNOSIS — Z6835 Body mass index (BMI) 35.0-35.9, adult: Secondary | ICD-10-CM | POA: Diagnosis not present

## 2023-01-30 ENCOUNTER — Encounter (INDEPENDENT_AMBULATORY_CARE_PROVIDER_SITE_OTHER): Payer: Self-pay

## 2023-01-30 DIAGNOSIS — D497 Neoplasm of unspecified behavior of endocrine glands and other parts of nervous system: Secondary | ICD-10-CM | POA: Diagnosis not present

## 2023-02-06 DIAGNOSIS — D352 Benign neoplasm of pituitary gland: Secondary | ICD-10-CM | POA: Diagnosis not present

## 2023-02-06 DIAGNOSIS — Z6836 Body mass index (BMI) 36.0-36.9, adult: Secondary | ICD-10-CM | POA: Diagnosis not present

## 2023-02-11 DIAGNOSIS — N3941 Urge incontinence: Secondary | ICD-10-CM | POA: Diagnosis not present

## 2023-02-11 DIAGNOSIS — E7800 Pure hypercholesterolemia, unspecified: Secondary | ICD-10-CM | POA: Diagnosis not present

## 2023-02-11 DIAGNOSIS — E291 Testicular hypofunction: Secondary | ICD-10-CM | POA: Diagnosis not present

## 2023-02-11 DIAGNOSIS — D352 Benign neoplasm of pituitary gland: Secondary | ICD-10-CM | POA: Diagnosis not present

## 2023-02-11 DIAGNOSIS — E038 Other specified hypothyroidism: Secondary | ICD-10-CM | POA: Diagnosis not present

## 2023-02-11 DIAGNOSIS — E1165 Type 2 diabetes mellitus with hyperglycemia: Secondary | ICD-10-CM | POA: Diagnosis not present

## 2023-02-11 DIAGNOSIS — E230 Hypopituitarism: Secondary | ICD-10-CM | POA: Diagnosis not present

## 2023-02-11 DIAGNOSIS — I10 Essential (primary) hypertension: Secondary | ICD-10-CM | POA: Diagnosis not present

## 2023-02-11 DIAGNOSIS — E1142 Type 2 diabetes mellitus with diabetic polyneuropathy: Secondary | ICD-10-CM | POA: Diagnosis not present

## 2023-02-12 ENCOUNTER — Other Ambulatory Visit: Payer: Self-pay | Admitting: Family Medicine

## 2023-02-12 DIAGNOSIS — E1142 Type 2 diabetes mellitus with diabetic polyneuropathy: Secondary | ICD-10-CM

## 2023-02-14 ENCOUNTER — Encounter (INDEPENDENT_AMBULATORY_CARE_PROVIDER_SITE_OTHER): Payer: Self-pay

## 2023-02-15 DIAGNOSIS — L821 Other seborrheic keratosis: Secondary | ICD-10-CM | POA: Diagnosis not present

## 2023-02-15 DIAGNOSIS — L720 Epidermal cyst: Secondary | ICD-10-CM | POA: Diagnosis not present

## 2023-02-15 DIAGNOSIS — Z85828 Personal history of other malignant neoplasm of skin: Secondary | ICD-10-CM | POA: Diagnosis not present

## 2023-02-15 DIAGNOSIS — L738 Other specified follicular disorders: Secondary | ICD-10-CM | POA: Diagnosis not present

## 2023-02-15 DIAGNOSIS — L281 Prurigo nodularis: Secondary | ICD-10-CM | POA: Diagnosis not present

## 2023-02-15 DIAGNOSIS — D2371 Other benign neoplasm of skin of right lower limb, including hip: Secondary | ICD-10-CM | POA: Diagnosis not present

## 2023-02-27 ENCOUNTER — Other Ambulatory Visit: Payer: Self-pay | Admitting: Family Medicine

## 2023-02-27 DIAGNOSIS — G8929 Other chronic pain: Secondary | ICD-10-CM

## 2023-02-27 NOTE — Telephone Encounter (Signed)
Tramadol 50 mg Requested Prescriptions   Pending Prescriptions Disp Refills   traMADol (ULTRAM) 50 MG tablet [Pharmacy Med Name: traMADol HCl 50 MG Oral Tablet] 120 tablet 0    Sig: TAKE 1 TABLET BY MOUTH EVERY 6 HOURS AS NEEDED     Date of patient request: 02/27/23 Last office visit: 12/24/22 Date of last refill: 01/11/23 Last refill amount: 120 Follow up time period per chart: 4 to 6 weeks

## 2023-02-27 NOTE — Telephone Encounter (Signed)
Controlled substance database (PDMP) reviewed. No concerns appreciated.  Last filled tramadol 01/11/23. Medication discussed in June.  Refill ordered.Marland Kitchen

## 2023-02-28 ENCOUNTER — Ambulatory Visit (INDEPENDENT_AMBULATORY_CARE_PROVIDER_SITE_OTHER): Payer: PPO | Admitting: *Deleted

## 2023-02-28 DIAGNOSIS — Z0000 Encounter for general adult medical examination without abnormal findings: Secondary | ICD-10-CM | POA: Diagnosis not present

## 2023-02-28 NOTE — Progress Notes (Signed)
Subjective:   Daniel [name] is a 72 y.o. male who presents for Medicare Annual/Subsequent preventive examination.  Visit Complete: Virtual  I connected with  Daniel [name] on 02/28/23 by a audio enabled telemedicine application and verified that I am speaking with the correct person using two identifiers.  Patient Location: Home  Provider Location: Home Office  I discussed the limitations of evaluation and management by telemedicine. The patient expressed understanding and agreed to proceed.  Patient Medicare AWV questionnaire was completed by the patient on 02-28-2023; I have confirmed that all information answered by patient is correct and no changes since this date.  Vital Signs: Unable to obtain new vitals due to this being a telehealth visit.   Review of Systems     Cardiac Risk Factors include: advanced age (>24men, >80 women);diabetes mellitus;male gender;sedentary lifestyle;family history of premature cardiovascular disease     Objective:    There were no vitals filed for this visit. There is no height or weight on file to calculate BMI.     02/28/2023    8:11 AM 06/18/2022    9:00 AM 02/15/2022    1:34 PM 12/26/2020   10:12 AM 02/26/2020    3:39 PM 02/18/2020    8:05 AM 01/25/2020   10:24 AM  Advanced Directives  Does Patient Have a Medical Advance Directive? Yes Yes Yes Yes Yes Yes Yes  Type of Diplomatic Services operational officer  Living will;Healthcare Power of Attorney Living will Healthcare Power of [place];Living will Healthcare Power of [place];Living will   Does patient want to make changes to medical advance directive?  No - Patient declined   No - Patient declined  No - Patient declined  Copy of Healthcare Power of Attorney in Chart? No - copy requested  No - copy requested        Current Medications (verified) Outpatient Encounter Medications as of 02/28/2023  Medication Sig   albuterol (VENTOLIN HFA) 108 (90 Base) MCG/ACT inhaler INHALE 2  PUFFS BY MOUTH EVERY 6 HOURS AS NEEDED FOR WHEEZING OR SHORTNESS OF BREATH   aspirin EC 81 MG tablet Take by mouth.   cabergoline (DOSTINEX) 0.5 MG tablet Take 0.25 mg by mouth 2 (two) times a week. Take twice a week   Cholecalciferol (VITAMIN D) 125 MCG (5000 UT) CAPS Take 5,000 Units by mouth daily.   Cyanocobalamin (VITAMIN B 12 PO) Take 1,000 mg by mouth daily.    ferrous sulfate 325 (65 FE) MG EC tablet Take 1 tablet (325 mg total) by mouth daily with breakfast.   fluticasone (FLONASE) 50 MCG/ACT nasal spray Place 1 spray into both nostrils daily.   gabapentin (NEURONTIN) 300 MG capsule TAKE 1 TO 3 CAPSULES BY MOUTH TWICE DAILY AS NEEDED WITH  ADDITIONAL  DOSE  IN  AFTERNOON  IF  NEEDED   glimepiride (AMARYL) 4 MG tablet Take 1 tablet (4 mg total) by mouth 2 (two) times daily.   glucose blood test strip Check glucose twice daily.   insulin glargine (LANTUS SOLOSTAR) 100 UNIT/ML Solostar Pen Inject 18-20 Units into the skin daily.   Insulin Pen Needle (PEN NEEDLES) 32G X 4 MM MISC 1 Application by Does not apply route daily.   Lancets (ONETOUCH DELICA PLUS LANCET33G) MISC USE 1 TO CHECK GLUCOSE ONCE DAILY   lisinopril (ZESTRIL) 20 MG tablet Take 1 tablet (20 mg total) by mouth daily.   loratadine (CLARITIN) 10 MG tablet Take by mouth.   montelukast (SINGULAIR) 10 MG tablet  Take 1 tablet (10 mg total) by mouth at bedtime.   Multiple Vitamin (MULTIVITAMIN WITH MINERALS) TABS tablet Take 1 tablet by mouth daily.   mupirocin ointment (BACTROBAN) 2 % Apply 1 Application topically 2 (two) times daily. For 1 week   ondansetron (ZOFRAN) 4 MG tablet Take 1 tablet (4 mg total) by mouth every 8 (eight) hours as needed for nausea or vomiting.   ONETOUCH ULTRA test strip USE 1 STRIP TO CHECK GLUCOSE ONCE DAILY   pravastatin (PRAVACHOL) 80 MG tablet Take 1 tablet (80 mg total) by mouth daily.   pregabalin (LYRICA) 100 MG capsule Take 100 mg by mouth 2 (two) times daily. One capsule by mouth twice a day    sertraline (ZOLOFT) 100 MG tablet Take 2 tablets (200 mg total) by mouth daily. 2 tablets daily.   Testosterone 1.62 % GEL Apply 1 Application topically daily.   traMADol (ULTRAM) 50 MG tablet TAKE 1 TABLET BY MOUTH EVERY 6 HOURS AS NEEDED   vitamin E 180 MG (400 UNITS) capsule Take 400 Units by mouth daily.   No facility-administered encounter medications on file as of 02/28/2023.    Allergies (verified) Codeine and Pioglitazone   History: Past Medical History:  Diagnosis Date   Allergic rhinitis    Allergy    Anxiety    Arthritis    Benign hypertension    Cataract    Depression    Diabetes mellitus (HCC)    type 2    GERD (gastroesophageal reflux disease)    History of pneumonia    HLD (hyperlipidemia)    Neuromuscular disorder (HCC)    pt denies at 8/19/visit    Pneumonia    hx of several times per pt - last episode 4-5 years ago    Past Surgical History:  Procedure Laterality Date   CARPAL TUNNEL RELEASE Bilateral 2020 Right, 2021 left   CATARACT EXTRACTION     bilateral   EYE SURGERY     KNEE SURGERY     right   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   Procedure: LEFT TOTAL KNEE ARTHROPLASTY;  Surgeon: Kathryne Hitch, MD;  Location: [facility];  Service: Orthopedics;  Laterality: Left;   Family History  Problem Relation Age of Onset   Diabetes Father    Colon polyps Father    Atrial fibrillation Mother    Prostate cancer Paternal Uncle    Cancer Sister    Esophageal cancer Neg Hx    Rectal cancer Neg Hx    Stomach cancer Neg Hx    Social History   Socioeconomic History   Marital status: Married    Spouse name: Not on file   Number of children: 3   Years of education: 16   Highest education level: Bachelor's degree (e.g., BA, AB, BS)  Occupational History   Occupation: Photographer, Optician, dispensing  Tobacco Use   Smoking status: Never   Smokeless tobacco: Never  Vaping Use   Vaping status: Never Used  Substance and Sexual Activity   Alcohol  use: No   Drug use: No   Sexual activity: Not Currently  Other Topics Concern   Not on file  Social History Narrative   Caffiene decaff tea and coffee, occ diet soda.   Social Determinants of Health   Financial Resource Strain: Low Risk  (02/28/2023)   Overall Financial Resource Strain (CARDIA)    Difficulty of Paying Living Expenses: Not hard at all  Food Insecurity: No Food Insecurity (02/28/2023)   Hunger  Vital Sign    Worried About Programme researcher, broadcasting/film/video in the Last Year: Never true    Ran Out of Food in the Last Year: Never true  Transportation Needs: No Transportation Needs (02/28/2023)   PRAPARE - Administrator, Civil Service (Medical): No    Lack of Transportation (Non-Medical): No  Physical Activity: Insufficiently Active (02/28/2023)   Exercise Vital Sign    Days of Exercise per Week: 1 day    Minutes of Exercise per Session: 20 min  Stress: No Stress Concern Present (02/28/2023)   [employer] of Occupational Health - Occupational Stress Questionnaire    Feeling of Stress : Not at all  Social Connections: Socially Integrated (02/28/2023)   Social Connection and Isolation Panel [NHANES]    Frequency of Communication with Friends and Family: Twice a week    Frequency of Social Gatherings with Friends and Family: Once a week    Attends Religious Services: More than 4 times per year    Active Member of [employer] or Organizations: Yes    Attends Engineer, structural: More than 4 times per year    Marital Status: Married    Tobacco Counseling Counseling given: Not Answered   Clinical Intake:  Pre-visit preparation completed: Yes  Pain : No/denies pain     Diabetes: Yes CBG done?: No Did pt. bring in CBG monitor from home?: No  How often do you need to have someone help you when you read instructions, pamphlets, or other written materials from your doctor or pharmacy?: 1 - Never  Interpreter Needed?: No  Information entered by :: Remi Haggard  LPN   Activities of Daily Living    02/28/2023    8:11 AM 02/28/2023    7:48 AM  In your present state of health, do you have any difficulty performing the following activities:  Hearing? 0 0  Vision? 0 0  Difficulty concentrating or making decisions? 0 0  Walking or climbing stairs? 0 0  Dressing or bathing? 0 0  Doing errands, shopping? 0 0  Preparing Food and eating ? N N  Using the Toilet? N N  In the past six months, have you accidently leaked urine? Y Y  Do you have problems with loss of bowel control? N N  Managing your Medications? N N  Managing your Finances? N N  Housekeeping or managing your Housekeeping? N N    Patient Care Team: Shade Flood, MD as PCP - General (Family Medicine)  Indicate any recent Medical Services you may have received from other than [facility] providers in the past year (date may be approximate).     Assessment:   This is a routine wellness examination for Jonus.  Hearing/Vision screen Hearing Screening - Comments:: Bilateral hearing aids Vision Screening - Comments:: Up to date [place]  Dietary issues and exercise activities discussed:     Goals Addressed             This Visit's Progress    Patient Stated       Wants to get energy back       Depression Screen    02/28/2023    8:12 AM 12/24/2022    8:19 AM 08/22/2022    8:22 AM 06/20/2022    8:18 AM 06/18/2022    9:00 AM 06/01/2022   10:51 AM 05/16/2022    9:31 AM  PHQ 2/9 Scores  PHQ - 2 Score 0 0 0 0 0 0 2  PHQ- 9  Score 2 5 2  0  2 8    Fall Risk    02/28/2023    8:05 AM 02/28/2023    7:48 AM 12/24/2022    8:19 AM 08/22/2022    8:22 AM 06/20/2022    8:18 AM  Fall Risk   Falls in the past year? 1 1 0 1 0  Number falls in past yr: 1 1 0 0 0  Injury with Fall? 1 0 0 0 0  Risk for fall due to :   No Fall Risks History of fall(s) No Fall Risks  Follow up Falls evaluation completed;Education provided;Falls prevention discussed  Falls evaluation completed Falls  evaluation completed Falls evaluation completed    MEDICARE RISK AT HOME: Medicare Risk at Home Any stairs in or around the home?: (P) Yes If so, are there any without handrails?: (P) No Home free of loose throw rugs in walkways, pet beds, electrical cords, etc?: (P) Yes Adequate lighting in your home to reduce risk of falls?: (P) Yes Life alert?: (P) No Use of a cane, walker or w/c?: (P) No Grab bars in the bathroom?: (P) Yes Shower chair or bench in shower?: (P) Yes Elevated toilet seat or a handicapped toilet?: (P) No  TIMED UP AND GO:  Was the test performed?  No    Cognitive Function:        02/28/2023    8:09 AM 02/15/2022    1:40 PM 01/25/2020   10:19 AM 03/04/2019    9:04 AM  6CIT Screen  What Year? 0 points 0 points 0 points 0 points  What month? 0 points 0 points  0 points  What time? 0 points 0 points 0 points 0 points  Count back from 20 0 points 0 points 0 points 0 points  Months in reverse 0 points 0 points 0 points 0 points  Repeat phrase 0 points 0 points 0 points 0 points  Total Score 0 points 0 points  0 points    Immunizations Immunization History  Administered Date(s) Administered   Fluad Quad(high Dose 65+) 02/11/2019, 04/06/2020, 03/09/2022   Influenza Split 04/01/2012   Influenza, High Dose Seasonal PF 07/14/2018, 07/14/2018   Influenza,inj,Quad PF,6+ Mos 05/03/2015, 02/28/2016, 04/08/2017   Influenza,inj,quad, With Preservative 04/01/2017   Influenza-Unspecified 04/01/2021   PFIZER(Purple Top)SARS-COV-2 Vaccination 08/31/2019, 09/21/2019, 05/07/2021   Pneumococcal Conjugate-13 09/13/2016   Pneumococcal Polysaccharide-23 04/05/2005, 07/19/2010, 02/11/2019   Tdap 03/14/2006, 12/31/2017   Zoster, Unspecified 05/18/2020, 06/01/2020, 05/23/2022    TDAP status: Up to date  Flu Vaccine status: Due, Education has been provided regarding the importance of this vaccine. Advised may receive this vaccine at local pharmacy or Health Dept. Aware to provide  a copy of the vaccination record if obtained from local pharmacy or Health Dept. Verbalized acceptance and understanding.  Pneumococcal vaccine status: Up to date  Covid-19 vaccine status: Information provided on how to obtain vaccines.   Qualifies for Shingles Vaccine? Yes   Zostavax completed No   Shingrix Completed?: No.    Education has been provided regarding the importance of this vaccine. Patient has been advised to call insurance company to determine out of pocket expense if they have not yet received this vaccine. Advised may also receive vaccine at local pharmacy or Health Dept. Verbalized acceptance and understanding.  Screening Tests Health Maintenance  Topic Date Due   Zoster Vaccines- Shingrix (1 of 2) 12/21/1969   FOOT EXAM  01/13/2023   INFLUENZA VACCINE  01/31/2023   COVID-19 Vaccine (4 -  2023-24 season) 03/16/2023 (Originally 03/02/2022)   HEMOGLOBIN A1C  06/25/2023   OPHTHALMOLOGY EXAM  11/27/2023   Diabetic kidney evaluation - eGFR measurement  12/24/2023   Diabetic kidney evaluation - Urine ACR  12/24/2023   Medicare Annual Wellness (AWV)  02/28/2024   DTaP/Tdap/Td (3 - Td or Tdap) 01/01/2028   Colonoscopy  11/21/2028   Pneumonia Vaccine 86+ Years old  Completed   Hepatitis C Screening  Completed   HPV VACCINES  Aged Out    Health Maintenance  Health Maintenance Due  Topic Date Due   Zoster Vaccines- Shingrix (1 of 2) 12/21/1969   FOOT EXAM  01/13/2023   INFLUENZA VACCINE  01/31/2023    Colorectal cancer screening: Type of screening: Colonoscopy. Completed 2023. Repeat every 7 years  Lung Cancer Screening: (Low Dose CT Chest recommended if Age 4-80 years, 20 pack-year currently smoking OR have quit w/in 15years.) does not qualify.   Lung Cancer Screening Referral:   Additional Screening:  Hepatitis C Screening: does not qualify; Completed 2017  Vision Screening: Recommended annual ophthalmology exams for early detection of glaucoma and other  disorders of the eye. Is the patient up to date with their annual eye exam?  Yes  Who is the provider or what is the name of the office in which the patient attends annual eye exams? [place] If pt is not established with a provider, would they like to be referred to a provider to establish care? No .   Dental Screening: Recommended annual dental exams for proper oral hygiene  Nutrition Risk Assessment:  Has the patient had any N/V/D within the last 2 months?  No  Does the patient have any non-healing wounds?  No  Has the patient had any unintentional weight loss or weight gain?  No   Diabetes:  Is the patient diabetic?  Yes  If diabetic, was a CBG obtained today?  No  Did the patient bring in their glucometer from home?  No  How often do you monitor your CBG's? 2 x a day.   Financial Strains and Diabetes Management:  Are you having any financial strains with the device, your supplies or your medication? No .  Does the patient want to be seen by Chronic Care Management for management of their diabetes?  No  Would the patient like to be referred to a Nutritionist or for Diabetic Management?  No   Diabetic Exams:  Diabetic Eye Exam: Completed.  Pt has been advised about the importance in completing this exam. A referral has been placed today  Diabetic Foot Exam: Completed . Pt has been advised about the importance in completing this exam.   Community Resource Referral / Chronic Care Management: CRR required this visit?  No   CCM required this visit?  No     Plan:     I have personally reviewed and noted the following in the patient's chart:   Medical and social history Use of alcohol, tobacco or illicit drugs  Current medications and supplements including opioid prescriptions. Patient is not currently taking opioid prescriptions. Functional ability and status Nutritional status Physical activity Advanced directives List of other physicians Hospitalizations, surgeries,  and ER visits in previous 12 months Vitals Screenings to include cognitive, depression, and falls Referrals and appointments  In addition, I have reviewed and discussed with patient certain preventive protocols, quality metrics, and best practice recommendations. A written personalized care plan for preventive services as well as general preventive health recommendations were provided to patient.  Remi Haggard, LPN   [id number]/61/[id number]   After Visit Summary: (MyChart) Due to this being a telephonic visit, the after visit summary with patients personalized plan was offered to patient via MyChart   Nurse Notes:

## 2023-02-28 NOTE — Patient Instructions (Signed)
Daniel [name] , Thank you for taking time to come for your Medicare Wellness Visit. I appreciate your ongoing commitment to your health goals. Please review the following plan we discussed and let me know if I can assist you in the future.   Screening recommendations/referrals: Colonoscopy: up to date Recommended yearly ophthalmology/optometry visit for glaucoma screening and checkup Recommended yearly dental visit for hygiene and checkup  Vaccinations: Influenza vaccine: Education provided Pneumococcal vaccine: up to date Tdap vaccine: up to date     Advanced directives: yes      Preventive Care 65 Years and Older, Male Preventive care refers to lifestyle choices and visits with your health care provider that can promote health and wellness. What does preventive care include? A yearly physical exam. This is also called an annual well check. Dental exams once or twice a year. Routine eye exams. Ask your health care provider how often you should have your eyes checked. Personal lifestyle choices, including: Daily care of your teeth and gums. Regular physical activity. Eating a healthy diet. Avoiding tobacco and drug use. Limiting alcohol use. Practicing safe sex. Taking low doses of aspirin every day. Taking vitamin and mineral supplements as recommended by your health care provider. What happens during an annual well check? The services and screenings done by your health care provider during your annual well check will depend on your age, overall health, lifestyle risk factors, and family history of disease. Counseling  Your health care provider may ask you questions about your: Alcohol use. Tobacco use. Drug use. Emotional well-being. Home and relationship well-being. Sexual activity. Eating habits. History of falls. Memory and ability to understand (cognition). Work and work Astronomer. Screening  You may have the following tests or measurements: Height, weight, and  BMI. Blood pressure. Lipid and cholesterol levels. These may be checked every 5 years, or more frequently if you are over 74 years old. Skin check. Lung cancer screening. You may have this screening every year starting at age 74 if you have a 30-pack-year history of smoking and currently smoke or have quit within the past 15 years. Fecal occult blood test (FOBT) of the stool. You may have this test every year starting at age 74. Flexible sigmoidoscopy or colonoscopy. You may have a sigmoidoscopy every 5 years or a colonoscopy every 10 years starting at age 74. Prostate cancer screening. Recommendations will vary depending on your family history and other risks. Hepatitis C blood test. Hepatitis B blood test. Sexually transmitted disease (STD) testing. Diabetes screening. This is done by checking your blood sugar (glucose) after you have not eaten for a while (fasting). You may have this done every 1-3 years. Abdominal aortic aneurysm (AAA) screening. You may need this if you are a current or former smoker. Osteoporosis. You may be screened starting at age 74 if you are at high risk. Talk with your health care provider about your test results, treatment options, and if necessary, the need for more tests. Vaccines  Your health care provider may recommend certain vaccines, such as: Influenza vaccine. This is recommended every year. Tetanus, diphtheria, and acellular pertussis (Tdap, Td) vaccine. You may need a Td booster every 10 years. Zoster vaccine. You may need this after age 74. Pneumococcal 13-valent conjugate (PCV13) vaccine. One dose is recommended after age 74. Pneumococcal polysaccharide (PPSV23) vaccine. One dose is recommended after age 74. Talk to your health care provider about which screenings and vaccines you need and how often you need them. This information is not  intended to replace advice given to you by your health care provider. Make sure you discuss any questions you have  with your health care provider. Document Released: 07/15/2015 Document Revised: 03/07/2016 Document Reviewed: 04/19/2015 Elsevier Interactive Patient Education  2017 [employer].  Fall Prevention in the Home Falls can cause injuries. They can happen to people of all ages. There are many things you can do to make your home safe and to help prevent falls. What can I do on the outside of my home? Regularly fix the edges of walkways and driveways and fix any cracks. Remove anything that might make you trip as you walk through a door, such as a raised step or threshold. Trim any bushes or trees on the path to your home. Use bright outdoor lighting. Clear any walking paths of anything that might make someone trip, such as rocks or tools. Regularly check to see if handrails are loose or broken. Make sure that both sides of any steps have handrails. Any raised decks and porches should have guardrails on the edges. Have any leaves, snow, or ice cleared regularly. Use sand or salt on walking paths during winter. Clean up any spills in your garage right away. This includes oil or grease spills. What can I do in the bathroom? Use night lights. Install grab bars by the toilet and in the tub and shower. Do not use towel bars as grab bars. Use non-skid mats or decals in the tub or shower. If you need to sit down in the shower, use a plastic, non-slip stool. Keep the floor dry. Clean up any water that spills on the floor as soon as it happens. Remove soap buildup in the tub or shower regularly. Attach bath mats securely with double-sided non-slip rug tape. Do not have throw rugs and other things on the floor that can make you trip. What can I do in the bedroom? Use night lights. Make sure that you have a light by your bed that is easy to reach. Do not use any sheets or blankets that are too big for your bed. They should not hang down onto the floor. Have a firm chair that has side arms. You can use  this for support while you get dressed. Do not have throw rugs and other things on the floor that can make you trip. What can I do in the kitchen? Clean up any spills right away. Avoid walking on wet floors. Keep items that you use a lot in easy-to-reach places. If you need to reach something above you, use a strong step stool that has a grab bar. Keep electrical cords out of the way. Do not use floor polish or wax that makes floors slippery. If you must use wax, use non-skid floor wax. Do not have throw rugs and other things on the floor that can make you trip. What can I do with my stairs? Do not leave any items on the stairs. Make sure that there are handrails on both sides of the stairs and use them. Fix handrails that are broken or loose. Make sure that handrails are as long as the stairways. Check any carpeting to make sure that it is firmly attached to the stairs. Fix any carpet that is loose or worn. Avoid having throw rugs at the top or bottom of the stairs. If you do have throw rugs, attach them to the floor with carpet tape. Make sure that you have a light switch at the top of the stairs  and the bottom of the stairs. If you do not have them, ask someone to add them for you. What else can I do to help prevent falls? Wear shoes that: Do not have high heels. Have rubber bottoms. Are comfortable and fit you well. Are closed at the toe. Do not wear sandals. If you use a stepladder: Make sure that it is fully opened. Do not climb a closed stepladder. Make sure that both sides of the stepladder are locked into place. Ask someone to hold it for you, if possible. Clearly mark and make sure that you can see: Any grab bars or handrails. First and last steps. Where the edge of each step is. Use tools that help you move around (mobility aids) if they are needed. These include: Canes. Walkers. Scooters. Crutches. Turn on the lights when you go into a dark area. Replace any light bulbs  as soon as they burn out. Set up your furniture so you have a clear path. Avoid moving your furniture around. If any of your floors are uneven, fix them. If there are any pets around you, be aware of where they are. Review your medicines with your doctor. Some medicines can make you feel dizzy. This can increase your chance of falling. Ask your doctor what other things that you can do to help prevent falls. This information is not intended to replace advice given to you by your health care provider. Make sure you discuss any questions you have with your health care provider. Document Released: 04/14/2009 Document Revised: 11/24/2015 Document Reviewed: 07/23/2014 Elsevier Interactive Patient Education  2017 [employer].

## 2023-03-11 DIAGNOSIS — E1165 Type 2 diabetes mellitus with hyperglycemia: Secondary | ICD-10-CM | POA: Diagnosis not present

## 2023-03-11 DIAGNOSIS — E291 Testicular hypofunction: Secondary | ICD-10-CM | POA: Diagnosis not present

## 2023-03-11 DIAGNOSIS — D352 Benign neoplasm of pituitary gland: Secondary | ICD-10-CM | POA: Diagnosis not present

## 2023-03-18 DIAGNOSIS — E1142 Type 2 diabetes mellitus with diabetic polyneuropathy: Secondary | ICD-10-CM | POA: Diagnosis not present

## 2023-03-18 DIAGNOSIS — E038 Other specified hypothyroidism: Secondary | ICD-10-CM | POA: Diagnosis not present

## 2023-03-18 DIAGNOSIS — D649 Anemia, unspecified: Secondary | ICD-10-CM | POA: Diagnosis not present

## 2023-03-18 DIAGNOSIS — I10 Essential (primary) hypertension: Secondary | ICD-10-CM | POA: Diagnosis not present

## 2023-03-18 DIAGNOSIS — E7800 Pure hypercholesterolemia, unspecified: Secondary | ICD-10-CM | POA: Diagnosis not present

## 2023-03-18 DIAGNOSIS — E291 Testicular hypofunction: Secondary | ICD-10-CM | POA: Diagnosis not present

## 2023-03-18 DIAGNOSIS — N189 Chronic kidney disease, unspecified: Secondary | ICD-10-CM | POA: Diagnosis not present

## 2023-03-18 DIAGNOSIS — E1165 Type 2 diabetes mellitus with hyperglycemia: Secondary | ICD-10-CM | POA: Diagnosis not present

## 2023-03-18 DIAGNOSIS — D352 Benign neoplasm of pituitary gland: Secondary | ICD-10-CM | POA: Diagnosis not present

## 2023-03-27 ENCOUNTER — Ambulatory Visit (INDEPENDENT_AMBULATORY_CARE_PROVIDER_SITE_OTHER): Payer: PPO | Admitting: Family Medicine

## 2023-03-27 ENCOUNTER — Encounter: Payer: Self-pay | Admitting: Family Medicine

## 2023-03-27 VITALS — BP 130/80 | HR 62 | Temp 98.6°F | Wt 248.0 lb

## 2023-03-27 DIAGNOSIS — Z23 Encounter for immunization: Secondary | ICD-10-CM | POA: Diagnosis not present

## 2023-03-27 DIAGNOSIS — D352 Benign neoplasm of pituitary gland: Secondary | ICD-10-CM | POA: Diagnosis not present

## 2023-03-27 DIAGNOSIS — F32A Depression, unspecified: Secondary | ICD-10-CM | POA: Diagnosis not present

## 2023-03-27 DIAGNOSIS — Z794 Long term (current) use of insulin: Secondary | ICD-10-CM

## 2023-03-27 DIAGNOSIS — E1142 Type 2 diabetes mellitus with diabetic polyneuropathy: Secondary | ICD-10-CM | POA: Diagnosis not present

## 2023-03-27 NOTE — Progress Notes (Signed)
Subjective:  Patient ID: Daniel [name], male    DOB: 09/18/1950  Age: 72 y.o. MRN: [id number]  CC:  Chief Complaint  Patient presents with   Medical Management of Chronic Issues    HPI Daniel [name] presents for   Diabetes: With neuropathy, hyperglycemia and elevated creatinine previously.  Started on insulin in December of last year.  We have had to adjust his doses based on home readings and some borderline lows.  Due to elevated creatinine metformin was discontinued, followed by nephrology.  Additionally had to stop HCTZ, PPI and avoiding other nephrotoxins including NSAIDs given his elevated creatinine.  Has continued to take glimepiride 4 mg twice daily and gabapentin 300 mg 1-3 twice daily for neuropathic symptoms and restless leg syndromes when discussed in June.  3 tabs twice daily at that time with 1 in the afternoon as needed. He was on 20 units of Lantus at his last visit but had been out for a few days.  He is on ACE inhibitor with lisinopril 20 mg, statin with pravastatin 80 mg.  He had recently increased to 20 units of insulin at that time.  Recommended additional 2 unit dosing with hypoglycemia precautions.  Now under care of endocrinology - Dr. Talmage Nap past 5 weeks.  Continued on lantus - 22 units, last visit about a week ago. Morning readings 60-105.  Considering change in type of insulin. Planned follow up in 3 weeks.  Switched form gabapentin to Lyrica- that has been helpful. 100mg  BID.  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  On testosterone topical 1.63 initially by urology - Dr. Annabell Howells, off meds for a month at request of endocrine, follow up labs and now back on testosterone. Microalbumin: 3.8 on 12/24/2022  Outside labs from endocrinology on 03/11/2023.  Glucose 113, BMP otherwise normal.  LFTs normal.  Testosterone 59, low, free testosterone 1.4, low.  A1c 8.9.  Free T4 low at 0.65, cortisol 14, TSH 3.18.  Prolactin  165.  Optho, foot exam, pneumovax: Due for foot exam.  flu vaccine - today.  COVID-vaccine booster - recommended,  and shingles vaccine - had at pharmacy.    Lab Results  Component Value Date   HGBA1C 9.7 (H) 12/24/2022   HGBA1C 10.2 (H) 09/20/2022   HGBA1C 8.8 (H) 06/20/2022   Lab Results  Component Value Date   MICROALBUR 3.8 (H) 12/24/2022   LDLCALC 132 (H) 12/24/2022   CREATININE 1.58 (H) 12/24/2022   Depression: Decreased control when discussed in June.  Anticipated some improvement once he started testosterone under the care of of urology, but also increased his sertraline to 200 mg daily.   Feels better on higher dose of sertraline. Managing well.  Energy level better on testosterone.      03/27/2023    8:31 AM 02/28/2023    8:12 AM 12/24/2022    8:19 AM 08/22/2022    8:22 AM 06/20/2022    8:18 AM  Depression screen PHQ 2/9  Decreased Interest 0 0 0 0 0  Down, Depressed, Hopeless 0 0 0 0 0  PHQ - 2 Score 0 0 0 0 0  Altered sleeping 0 1 1 1  0  Tired, decreased energy 1 1 3 1  0  Change in appetite 1 0 1 0 0  Feeling bad or failure about yourself  0 0 0 0 0  Trouble concentrating 0 0 0 0 0  Moving slowly or fidgety/restless 0 0 0 0  0  Suicidal thoughts 0 0 0 0 0  PHQ-9 Score 2 2 5 2  0  Difficult doing work/chores Not difficult at all Not difficult at all       History 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   Trigger finger, left little finger 02/04/2020   Carpal tunnel syndrome, left upper limb 11/24/2019   Unilateral primary osteoarthritis, left knee 11/02/2019   Unilateral primary osteoarthritis, right knee 11/02/2019   Carpal tunnel syndrome, right upper limb 02/18/2018   Anxiety and depression 10/28/2016   Gastroesophageal reflux disease without esophagitis 10/28/2016   Seasonal allergic rhinitis due to pollen 10/28/2016   Primary osteoarthritis of left knee 07/25/2015   Essential  hypertension 07/25/2015   Hypercholesteremia 07/25/2015   Type 2 diabetes mellitus with diabetic polyneuropathy, without long-term current use of insulin (HCC) 07/25/2015   Pollen allergies 02/10/2014   Allergic rhinitis due to animal (cat) (dog) hair and dander 09/15/2012   Extrinsic asthma 09/13/2011   Proteinuria 11/24/2010   Past Medical History:  Diagnosis Date   Allergic rhinitis    Allergy    Anxiety    Arthritis    Benign hypertension    Cataract    Depression    Diabetes mellitus (HCC)    type 2    GERD (gastroesophageal reflux disease)    History of pneumonia    HLD (hyperlipidemia)    Neuromuscular disorder (HCC)    pt denies at 8/19/visit    Pneumonia    hx of several times per pt - last episode 4-5 years ago    Past Surgical History:  Procedure Laterality Date   CARPAL TUNNEL RELEASE Bilateral 2020 Right, 2021 left   CATARACT EXTRACTION     bilateral   EYE SURGERY     KNEE SURGERY     right   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   Procedure: LEFT TOTAL KNEE ARTHROPLASTY;  Surgeon: Kathryne Hitch, MD;  Location: [facility];  Service: Orthopedics;  Laterality: Left;   Allergies  Allergen Reactions   Codeine Palpitations, Shortness Of Breath and Other (See Comments)   Pioglitazone Other (See Comments), Shortness Of Breath and Swelling    Blood sugar went up and down and sweating profusely   Prior to Admission medications   Medication Sig Start Date End Date Taking? Authorizing Provider  albuterol (VENTOLIN HFA) 108 (90 Base) MCG/ACT inhaler INHALE 2 PUFFS BY MOUTH EVERY 6 HOURS AS NEEDED FOR WHEEZING OR SHORTNESS OF BREATH 03/15/22  Yes Shade Flood, MD  aspirin EC 81 MG tablet Take by mouth. 10/17/13  Yes [provider]  cabergoline (DOSTINEX) 0.5 MG tablet Take 0.25 mg by mouth 2 (two) times a week. Take twice a week   Yes [provider]  Cholecalciferol (VITAMIN D) 125 MCG (5000 UT) CAPS Take 5,000 Units by mouth daily.   Yes  [provider]  Cyanocobalamin (VITAMIN B 12 PO) Take 1,000 mg by mouth daily.    Yes [provider]  ferrous sulfate 325 (65 FE) MG EC tablet Take 1 tablet (325 mg total) by mouth daily with breakfast. 10/05/21  Yes Armbruster, Willaim Rayas, MD  fluticasone (FLONASE) 50 MCG/ACT nasal spray Place 1 spray into both nostrils daily.   Yes [provider]  gabapentin (NEURONTIN) 300 MG capsule TAKE 1 TO 3 CAPSULES BY MOUTH TWICE DAILY AS NEEDED WITH  ADDITIONAL  DOSE  IN  AFTERNOON  IF  NEEDED 02/12/23  Yes Shade Flood, MD  glimepiride (AMARYL) 4 MG tablet Take 1 tablet (4 mg total) by mouth 2 (two) times daily. 12/24/22  Yes Shade Flood, MD  glucose blood test strip Check glucose twice daily. 10/20/13  Yes [provider]  insulin glargine (LANTUS SOLOSTAR) 100 UNIT/ML Solostar Pen Inject 18-20 Units into the skin daily. 12/17/22  Yes Shade Flood, MD  Insulin Pen Needle (PEN NEEDLES) 32G X 4 MM MISC 1 Application by Does not apply route daily. 06/20/22  Yes Shade Flood, MD  Lancets [facility] DELICA PLUS LANCET33G) MISC USE 1 TO CHECK GLUCOSE ONCE DAILY 08/04/18  Yes [provider]  levothyroxine (SYNTHROID) 50 MCG tablet Take 50 mcg by mouth daily before breakfast.   Yes [provider]  lisinopril (ZESTRIL) 20 MG tablet Take 1 tablet (20 mg total) by mouth daily. 12/24/22  Yes Shade Flood, MD  loratadine (CLARITIN) 10 MG tablet Take by mouth. 10/17/13  Yes [provider]  montelukast (SINGULAIR) 10 MG tablet Take 1 tablet (10 mg total) by mouth at bedtime. 12/24/22  Yes Shade Flood, MD  Multiple Vitamin (MULTIVITAMIN WITH MINERALS) TABS tablet Take 1 tablet by mouth daily.   Yes [provider]  mupirocin ointment (BACTROBAN) 2 % Apply 1 Application topically 2 (two) times daily. For 1 week 10/11/22  Yes Shade Flood, MD  ondansetron (ZOFRAN) 4 MG tablet Take 1 tablet (4 mg total) by mouth every 8  (eight) hours as needed for nausea or vomiting. 09/20/22  Yes Shade Flood, MD  [facility] ULTRA test strip USE 1 STRIP TO CHECK GLUCOSE ONCE DAILY 11/25/18  Yes Shade Flood, MD  pravastatin (PRAVACHOL) 80 MG tablet Take 1 tablet (80 mg total) by mouth daily. 12/24/22  Yes Shade Flood, MD  pregabalin (LYRICA) 100 MG capsule Take 100 mg by mouth 2 (two) times daily. One capsule by mouth twice a day   Yes [provider]  sertraline (ZOLOFT) 100 MG tablet Take 2 tablets (200 mg total) by mouth daily. 2 tablets daily. 12/24/22  Yes Shade Flood, MD  Testosterone 1.62 % GEL Apply 1 Application topically daily. 11/29/22  Yes [provider]  traMADol (ULTRAM) 50 MG tablet TAKE 1 TABLET BY MOUTH EVERY 6 HOURS AS NEEDED 02/27/23  Yes Shade Flood, MD  vitamin E 180 MG (400 UNITS) capsule Take 400 Units by mouth daily.   Yes [provider]   Social History   Socioeconomic History   Marital status: Married    Spouse name: Not on file   Number of children: 3   Years of education: 16   Highest education level: Bachelor's degree (e.g., BA, AB, BS)  Occupational History   Occupation: Photographer, Optician, dispensing  Tobacco Use   Smoking status: Never   Smokeless tobacco: Never  Vaping Use   Vaping status: Never Used  Substance and Sexual Activity   Alcohol use: No   Drug use: No   Sexual activity: Not Currently  Other Topics Concern   Not on file  Social History Narrative   Caffiene decaff tea and coffee, occ diet soda.   Social Determinants of Health   Financial Resource Strain: Low Risk  (02/28/2023)   Overall Financial Resource Strain (CARDIA)    Difficulty of Paying Living Expenses: Not hard at all  Food Insecurity: No Food Insecurity (02/28/2023)   Hunger Vital Sign    Worried About Running Out of  Food in the Last Year: Never true    Ran Out of Food in the Last Year: Never true  Transportation Needs: No Transportation Needs (02/28/2023)    PRAPARE - Administrator, Civil Service (Medical): No    Lack of Transportation (Non-Medical): No  Physical Activity: Insufficiently Active (02/28/2023)   Exercise Vital Sign    Days of Exercise per Week: 1 day    Minutes of Exercise per Session: 20 min  Stress: No Stress Concern Present (02/28/2023)   [employer] of Occupational Health - Occupational Stress Questionnaire    Feeling of Stress : Not at all  Social Connections: Socially Integrated (02/28/2023)   Social Connection and Isolation Panel [NHANES]    Frequency of Communication with Friends and Family: Twice a week    Frequency of Social Gatherings with Friends and Family: Once a week    Attends Religious Services: More than 4 times per year    Active Member of [employer] or Organizations: Yes    Attends Engineer, structural: More than 4 times per year    Marital Status: Married  Catering manager Violence: Not At Risk (02/15/2022)   Humiliation, Afraid, Rape, and Kick questionnaire    Fear of Current or Ex-Partner: No    Emotionally Abused: No    Physically Abused: No    Sexually Abused: No    Review of Systems   Objective:   Vitals:   03/27/23 0818  BP: 130/80  Pulse: 62  Temp: 98.6 F (37 C)  TempSrc: Oral  SpO2: 96%  Weight: 248 lb (112.5 kg)     Physical Exam Vitals reviewed.  Constitutional:      Appearance: He is well-developed.  HENT:     Head: Normocephalic and atraumatic.  Neck:     Vascular: No carotid bruit or JVD.  Cardiovascular:     Rate and Rhythm: Normal rate and regular rhythm.     Heart sounds: Normal heart sounds. No murmur heard. Pulmonary:     Effort: Pulmonary effort is normal.     Breath sounds: Normal breath sounds. No rales.  Musculoskeletal:     Right lower leg: No edema.     Left lower leg: No edema.  Skin:    General: Skin is warm and dry.  Neurological:     Mental Status: He is alert and oriented to person, place, and time.  Psychiatric:        Mood  and Affect: Mood normal.     Diabetic Foot Exam - Simple   Simple Foot Form Diabetic Foot exam was performed with the following findings: Yes 03/27/2023  9:08 AM  Visual Inspection See comments: Yes Sensation Testing See comments: Yes Pulse Check Posterior Tibialis and Dorsalis pulse intact bilaterally: Yes Comments Skin intact without concerning callus or wounds.  Multiple discolored nails, thickened nails.  Slight decrease sensation at the right great toe, greater than left great toe, otherwise monofilament testing intact.       Assessment & Plan:  Daniel [name] is a 72 y.o. male . Type 2 diabetes mellitus with diabetic polyneuropathy, without long-term current use of insulin (HCC) - Plan: Flu Vaccine Trivalent High Dose (Fluad)  -Diabetes under the care of of endocrinology, considering adjustment in type of insulin given postprandial elevations but plans to follow-up with endocrinology to make that change.  No med changes at this time.  Depression, unspecified depression type  -Improved with higher dose sertraline, continue same.  Recheck in 3 months,  review other chronic medical conditions at that time including lipids and knee pain.  Pituitary macroadenoma (HCC) Prolactinoma (HCC)  -Under the care of endocrinology and neurosurgery as above.  Continue follow-up with specialists.  Fatigue has improved likely in part due to treatment of hypergonadism/low testosterone as well as depression as above.  RTC precautions.  No orders of the defined types were placed in this encounter.  Patient Instructions  Thanks for coming in today.  Keep follow up with endocrinology as planned to consider insulin changes.  Glad you are feeling better. No change in sertraline dose for now.  Take care.      Signed,   Meredith Staggers, MD [facility], [facility] 03/27/23 9:10 AM

## 2023-03-27 NOTE — Patient Instructions (Addendum)
Thanks for coming in today.  Keep follow up with endocrinology as planned to consider insulin changes.  Glad you are feeling better. No change in sertraline dose for now.  Take care.

## 2023-04-18 ENCOUNTER — Telehealth: Payer: Self-pay

## 2023-04-18 DIAGNOSIS — E1142 Type 2 diabetes mellitus with diabetic polyneuropathy: Secondary | ICD-10-CM

## 2023-04-18 MED ORDER — LANTUS SOLOSTAR 100 UNIT/ML ~~LOC~~ SOPN
22.0000 [IU] | PEN_INJECTOR | Freq: Every day | SUBCUTANEOUS | 3 refills | Status: DC
Start: 2023-04-18 — End: 2023-06-19

## 2023-04-18 NOTE — Addendum Note (Signed)
Addended by: Meredith Staggers R on: 04/18/2023 09:09 PM   Modules accepted: Orders

## 2023-04-18 NOTE — Telephone Encounter (Signed)
Pt was in the process of changing his DM meds but provider has had to take a break due to family issues and there is no return date at this time. Lantus is current medication but is going to run out due to malfunctioning pen (1.5 pens did not work) asking if you would send a refill until Dr Romero Belling can return from [place]?  (Needs a note on RX that it is early due to pen malfunction)

## 2023-04-18 NOTE — Telephone Encounter (Signed)
Ordered

## 2023-04-22 ENCOUNTER — Telehealth: Payer: Self-pay | Admitting: Radiology

## 2023-04-22 NOTE — Telephone Encounter (Signed)
Okay.  Thank you for checking on this.  Let me know if I can help further.

## 2023-04-25 ENCOUNTER — Other Ambulatory Visit: Payer: Self-pay | Admitting: Family Medicine

## 2023-04-25 DIAGNOSIS — G8929 Other chronic pain: Secondary | ICD-10-CM

## 2023-04-25 NOTE — Telephone Encounter (Signed)
Last refill 02/27/2023 Last office visit 03/27/2023

## 2023-04-25 NOTE — Telephone Encounter (Signed)
Chronic knee pain discussed in June.  2-3 tramadol per day with rare need for for severe his pain at that time, we agreed to continue same regimen.  Surgery has been deferred.  Controlled med database reviewed.  #120 last filled on 02/28/2023.  Refill ordered.

## 2023-05-06 DIAGNOSIS — D352 Benign neoplasm of pituitary gland: Secondary | ICD-10-CM | POA: Diagnosis not present

## 2023-05-06 DIAGNOSIS — Z6837 Body mass index (BMI) 37.0-37.9, adult: Secondary | ICD-10-CM | POA: Diagnosis not present

## 2023-06-11 DIAGNOSIS — E291 Testicular hypofunction: Secondary | ICD-10-CM | POA: Diagnosis not present

## 2023-06-11 DIAGNOSIS — D352 Benign neoplasm of pituitary gland: Secondary | ICD-10-CM | POA: Diagnosis not present

## 2023-06-11 DIAGNOSIS — E1165 Type 2 diabetes mellitus with hyperglycemia: Secondary | ICD-10-CM | POA: Diagnosis not present

## 2023-06-18 DIAGNOSIS — E1165 Type 2 diabetes mellitus with hyperglycemia: Secondary | ICD-10-CM | POA: Diagnosis not present

## 2023-06-18 DIAGNOSIS — E7800 Pure hypercholesterolemia, unspecified: Secondary | ICD-10-CM | POA: Diagnosis not present

## 2023-06-18 DIAGNOSIS — E1142 Type 2 diabetes mellitus with diabetic polyneuropathy: Secondary | ICD-10-CM | POA: Diagnosis not present

## 2023-06-18 DIAGNOSIS — E291 Testicular hypofunction: Secondary | ICD-10-CM | POA: Diagnosis not present

## 2023-06-18 DIAGNOSIS — I10 Essential (primary) hypertension: Secondary | ICD-10-CM | POA: Diagnosis not present

## 2023-06-18 DIAGNOSIS — E038 Other specified hypothyroidism: Secondary | ICD-10-CM | POA: Diagnosis not present

## 2023-06-18 DIAGNOSIS — D352 Benign neoplasm of pituitary gland: Secondary | ICD-10-CM | POA: Diagnosis not present

## 2023-06-18 DIAGNOSIS — D649 Anemia, unspecified: Secondary | ICD-10-CM | POA: Diagnosis not present

## 2023-06-18 DIAGNOSIS — N189 Chronic kidney disease, unspecified: Secondary | ICD-10-CM | POA: Diagnosis not present

## 2023-06-19 ENCOUNTER — Ambulatory Visit: Payer: PPO | Admitting: Family Medicine

## 2023-06-19 VITALS — BP 124/66 | HR 63 | Temp 98.3°F | Ht 68.0 in | Wt 248.6 lb

## 2023-06-19 DIAGNOSIS — E291 Testicular hypofunction: Secondary | ICD-10-CM | POA: Diagnosis not present

## 2023-06-19 DIAGNOSIS — J4520 Mild intermittent asthma, uncomplicated: Secondary | ICD-10-CM

## 2023-06-19 DIAGNOSIS — E1142 Type 2 diabetes mellitus with diabetic polyneuropathy: Secondary | ICD-10-CM

## 2023-06-19 DIAGNOSIS — D352 Benign neoplasm of pituitary gland: Secondary | ICD-10-CM | POA: Diagnosis not present

## 2023-06-19 DIAGNOSIS — M25561 Pain in right knee: Secondary | ICD-10-CM

## 2023-06-19 DIAGNOSIS — E7800 Pure hypercholesterolemia, unspecified: Secondary | ICD-10-CM | POA: Diagnosis not present

## 2023-06-19 DIAGNOSIS — G8929 Other chronic pain: Secondary | ICD-10-CM | POA: Diagnosis not present

## 2023-06-19 DIAGNOSIS — F32A Depression, unspecified: Secondary | ICD-10-CM

## 2023-06-19 DIAGNOSIS — I10 Essential (primary) hypertension: Secondary | ICD-10-CM

## 2023-06-19 DIAGNOSIS — M25562 Pain in left knee: Secondary | ICD-10-CM

## 2023-06-19 LAB — COMPREHENSIVE METABOLIC PANEL
ALT: 14 U/L (ref 0–53)
AST: 18 U/L (ref 0–37)
Albumin: 4.1 g/dL (ref 3.5–5.2)
Alkaline Phosphatase: 75 U/L (ref 39–117)
BUN: 24 mg/dL — ABNORMAL HIGH (ref 6–23)
CO2: 30 meq/L (ref 19–32)
Calcium: 9.7 mg/dL (ref 8.4–10.5)
Chloride: 101 meq/L (ref 96–112)
Creatinine, Ser: 1.64 mg/dL — ABNORMAL HIGH (ref 0.40–1.50)
GFR: 40.96 mL/min — ABNORMAL LOW (ref >60.00)
Glucose, Bld: 190 mg/dL — ABNORMAL HIGH (ref 70–99)
Potassium: 4.9 meq/L (ref 3.5–5.1)
Sodium: 137 meq/L (ref 135–145)
Total Bilirubin: 0.3 mg/dL (ref 0.2–1.2)
Total Protein: 7.1 g/dL (ref 6.0–8.3)

## 2023-06-19 LAB — LIPID PANEL
Cholesterol: 247 mg/dL — ABNORMAL HIGH (ref 0–200)
HDL: 53.2 mg/dL (ref >39.00)
LDL Cholesterol: 136 mg/dL — ABNORMAL HIGH (ref 0–99)
NonHDL: 194.2
Total CHOL/HDL Ratio: 5
Triglycerides: 289 mg/dL — ABNORMAL HIGH (ref 0.0–149.0)
VLDL: 57.8 mg/dL — ABNORMAL HIGH (ref 0.0–40.0)

## 2023-06-19 MED ORDER — PRAVASTATIN SODIUM 80 MG PO TABS
80.0000 mg | ORAL_TABLET | Freq: Every day | ORAL | 1 refills | Status: DC
Start: 2023-06-19 — End: 2023-06-24

## 2023-06-19 MED ORDER — MONTELUKAST SODIUM 10 MG PO TABS
10.0000 mg | ORAL_TABLET | Freq: Every day | ORAL | 2 refills | Status: AC
Start: 2023-06-19 — End: ?

## 2023-06-19 MED ORDER — SERTRALINE HCL 100 MG PO TABS
200.0000 mg | ORAL_TABLET | Freq: Every day | ORAL | 1 refills | Status: DC
Start: 2023-06-19 — End: 2024-02-17

## 2023-06-19 NOTE — Progress Notes (Signed)
Subjective:  Patient ID: Daniel [name], male    DOB: Nov 28, 1950  Age: 72 y.o. MRN: [id number]  CC:  Chief Complaint  Patient presents with   Medical Management of Chronic Issues    Pt notes doing well, saw Dr Romero Belling yesterday and they are changing his lantus to something else     HPI Daniel [name] presents for   Follow-up of chronic conditions  Diabetes: With neuropathy, hyperglycemia and prior elevated creatinine.  Has been followed by nephrology, HCTZ, PPI and other nephrotoxins were stopped including NSAIDs.  Lyrica in place of gabapentin for his neuropathy and restless leg syndrome.  100 mg twice daily has been effective- still working well.  He is on ACE inhibitor as well as statin. No new myalgias/side effects.  Now under care of endocrinology, Dr.Balan.  Reports he had appointment yesterday and they are changing Lantus to different insulin - BID dosing. A1c up to 9.9. afternoon readings were increasing.  Also on levothyroxine for hypothyroidism, managed by endocrinology - new dose yesterday. Labs on patients' phone from 12/10: A1c 9.9 Testosterone 63, free 1.4 - increased dose of cabergoline yesterday.   Free t4 0.69.  Prolactin 294  Lab Results  Component Value Date   HGBA1C 9.7 (H) 12/24/2022   HGBA1C 10.2 (H) 09/20/2022   HGBA1C 8.8 (H) 06/20/2022   Lab Results  Component Value Date   MICROALBUR 3.8 (H) 12/24/2022   LDLCALC 132 (H) 12/24/2022   CREATININE 1.58 (H) 12/24/2022  No recent nephrology eval.  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 Maisie Fus.  No peripheral vision loss.  Prolactin numbers have responded well to cabergoline.  Did not recommend surgical intervention as long as he was having a good medical response, 63-month follow-up MRI planned which would be due in about 2 months.  Followed by urology for hypogonadotrophic  hypogonadism, low testosterone.  Dr. Annabell Howells.  Treated with AndroGel at his August appointment and and [facility] for nocturia.  Depression: Decreased control in June, thought to be a component of the hypogonadism but also increased his sertraline to 200 mg daily.  Improving at September visit.  Energy level was better on testosterone at that time. Still feels like this dose is working well - no new side effects.      06/19/2023    9:55 AM 03/27/2023    8:31 AM 02/28/2023    8:12 AM 12/24/2022    8:19 AM 08/22/2022    8:22 AM  Depression screen PHQ 2/9  Decreased Interest 0 0 0 0 0  Down, Depressed, Hopeless 0 0 0 0 0  PHQ - 2 Score 0 0 0 0 0  Altered sleeping 0 0 1 1 1   Tired, decreased energy 0 1 1 3 1   Change in appetite 0 1 0 1 0  Feeling bad or failure about yourself  0 0 0 0 0  Trouble concentrating 0 0 0 0 0  Moving slowly or fidgety/restless 0 0 0 0 0  Suicidal thoughts 0 0 0 0 0  PHQ-9 Score 0 2 2 5 2   Difficult doing work/chores  Not difficult at all Not difficult at all     Chronic Knee pain: R side. Prior left knee replacement. Surgery has been deferred for R knee. Managing with tramadol 1-2 per day - doing ok lately. Some days just one per day.  Still  has some tramadol left.  Controlled substance database (PDMP) reviewed. No concerns appreciated. #120 tramadol on 04/26/23.    History 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   Trigger finger, left little finger 02/04/2020   Carpal tunnel syndrome, left upper limb 11/24/2019   Unilateral primary osteoarthritis, left knee 11/02/2019   Unilateral primary osteoarthritis, right knee 11/02/2019   Carpal tunnel syndrome, right upper limb 02/18/2018   Anxiety and depression 10/28/2016   Gastroesophageal reflux disease without esophagitis 10/28/2016   Seasonal allergic rhinitis due to pollen 10/28/2016   Primary osteoarthritis of left knee 07/25/2015   Essential  hypertension 07/25/2015   Hypercholesteremia 07/25/2015   Type 2 diabetes mellitus with diabetic polyneuropathy, without long-term current use of insulin (HCC) 07/25/2015   Pollen allergies 02/10/2014   Allergic rhinitis due to animal (cat) (dog) hair and dander 09/15/2012   Extrinsic asthma 09/13/2011   Proteinuria 11/24/2010   Past Medical History:  Diagnosis Date   Allergic rhinitis    Allergy    Anxiety    Arthritis    Benign hypertension    Cataract    Depression    Diabetes mellitus (HCC)    type 2    GERD (gastroesophageal reflux disease)    History of pneumonia    HLD (hyperlipidemia)    Neuromuscular disorder (HCC)    pt denies at 8/19/visit    Pneumonia    hx of several times per pt - last episode 4-5 years ago    Past Surgical History:  Procedure Laterality Date   CARPAL TUNNEL RELEASE Bilateral 2020 Right, 2021 left   CATARACT EXTRACTION     bilateral   EYE SURGERY     KNEE SURGERY     right   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   Procedure: LEFT TOTAL KNEE ARTHROPLASTY;  Surgeon: Kathryne Hitch, MD;  Location: [facility];  Service: Orthopedics;  Laterality: Left;   Allergies  Allergen Reactions   Codeine Palpitations, Shortness Of Breath and Other (See Comments)   Pioglitazone Other (See Comments), Shortness Of Breath and Swelling    Blood sugar went up and down and sweating profusely   Prior to Admission medications   Medication Sig Start Date End Date Taking? Authorizing Provider  albuterol (VENTOLIN HFA) 108 (90 Base) MCG/ACT inhaler INHALE 2 PUFFS BY MOUTH EVERY 6 HOURS AS NEEDED FOR WHEEZING OR SHORTNESS OF BREATH 03/15/22  Yes Shade Flood, MD  aspirin EC 81 MG tablet Take by mouth. 10/17/13  Yes [provider]  cabergoline (DOSTINEX) 0.5 MG tablet Take 0.25 mg by mouth 2 (two) times a week. Take twice a week   Yes [provider]  Cholecalciferol (VITAMIN D) 125 MCG (5000 UT) CAPS Take 5,000 Units by mouth daily.   Yes  [provider]  Cyanocobalamin (VITAMIN B 12 PO) Take 1,000 mg by mouth daily.    Yes [provider]  ferrous sulfate 325 (65 FE) MG EC tablet Take 1 tablet (325 mg total) by mouth daily with breakfast. 10/05/21  Yes Armbruster, Willaim Rayas, MD  fluticasone (FLONASE) 50 MCG/ACT nasal spray Place 1 spray into both nostrils daily.   Yes [provider]  GEMTESA 75 MG TABS  02/11/23  Yes [provider]  glucose blood test strip Check glucose twice daily. 10/20/13  Yes [provider]  HUMALOG MIX 75/25 KWIKPEN (75-25) 100 UNIT/ML KwikPen 10u Subcutaneous twice a day before meals for  30 days 06/18/23  Yes [provider]  Insulin Pen Needle (PEN NEEDLES) 32G X 4 MM MISC 1 Application by Does not apply route daily. 06/20/22  Yes Shade Flood, MD  Lancets [facility] DELICA PLUS LANCET33G) MISC USE 1 TO CHECK GLUCOSE ONCE DAILY 08/04/18  Yes [provider]  levothyroxine (SYNTHROID) 50 MCG tablet Take 50 mcg by mouth daily before breakfast.   Yes [provider]  lisinopril (ZESTRIL) 20 MG tablet Take 1 tablet (20 mg total) by mouth daily. 12/24/22  Yes Shade Flood, MD  loratadine (CLARITIN) 10 MG tablet Take by mouth. 10/17/13  Yes [provider]  montelukast (SINGULAIR) 10 MG tablet Take 1 tablet (10 mg total) by mouth at bedtime. 12/24/22  Yes Shade Flood, MD  Multiple Vitamin (MULTIVITAMIN WITH MINERALS) TABS tablet Take 1 tablet by mouth daily.   Yes [provider]  mupirocin ointment (BACTROBAN) 2 % Apply 1 Application topically 2 (two) times daily. For 1 week 10/11/22  Yes Shade Flood, MD  ondansetron (ZOFRAN) 4 MG tablet Take 1 tablet (4 mg total) by mouth every 8 (eight) hours as needed for nausea or vomiting. 09/20/22  Yes Shade Flood, MD  [facility] ULTRA test strip USE 1 STRIP TO CHECK GLUCOSE ONCE DAILY 11/25/18  Yes Shade Flood, MD  pravastatin (PRAVACHOL) 80 MG tablet Take 1  tablet (80 mg total) by mouth daily. 12/24/22  Yes Shade Flood, MD  pregabalin (LYRICA) 100 MG capsule Take 100 mg by mouth 2 (two) times daily. One capsule by mouth twice a day   Yes [provider]  sertraline (ZOLOFT) 100 MG tablet Take 2 tablets (200 mg total) by mouth daily. 2 tablets daily. 12/24/22  Yes Shade Flood, MD  Testosterone 1.62 % GEL Apply 1 Application topically daily. 11/29/22  Yes [provider]  traMADol (ULTRAM) 50 MG tablet TAKE 1 TABLET BY MOUTH EVERY 6 HOURS AS NEEDED 04/25/23  Yes Shade Flood, MD  vitamin E 180 MG (400 UNITS) capsule Take 400 Units by mouth daily.   Yes [provider]  glimepiride (AMARYL) 4 MG tablet Take 1 tablet (4 mg total) by mouth 2 (two) times daily. Patient not taking: Reported on 06/19/2023 12/24/22   Shade Flood, MD  insulin glargine (LANTUS SOLOSTAR) 100 UNIT/ML Solostar Pen Inject 22 Units into the skin daily. Patient not taking: Reported on 06/19/2023 04/18/23   Shade Flood, MD   Social History   Socioeconomic History   Marital status: Married    Spouse name: Not on file   Number of children: 3   Years of education: 16   Highest education level: Bachelor's degree (e.g., BA, AB, BS)  Occupational History   Occupation: Photographer, Optician, dispensing  Tobacco Use   Smoking status: Never   Smokeless tobacco: Never  Vaping Use   Vaping status: Never Used  Substance and Sexual Activity   Alcohol use: No   Drug use: No   Sexual activity: Not Currently  Other Topics Concern   Not on file  Social History Narrative   Caffiene decaff tea and coffee, occ diet soda.   Social Drivers of Corporate investment banker Strain: Low Risk  (02/28/2023)   Overall Financial Resource Strain (CARDIA)    Difficulty of Paying Living Expenses: Not hard at all  Food Insecurity: No Food Insecurity (02/28/2023)   Hunger Vital Sign    Worried About Running Out of Food in the  Last Year: Never true    Ran  Out of Food in the Last Year: Never true  Transportation Needs: No Transportation Needs (02/28/2023)   PRAPARE - Administrator, Civil Service (Medical): No    Lack of Transportation (Non-Medical): No  Physical Activity: Insufficiently Active (02/28/2023)   Exercise Vital Sign    Days of Exercise per Week: 1 day    Minutes of Exercise per Session: 20 min  Stress: No Stress Concern Present (02/28/2023)   [employer] of Occupational Health - Occupational Stress Questionnaire    Feeling of Stress : Not at all  Social Connections: Socially Integrated (02/28/2023)   Social Connection and Isolation Panel [NHANES]    Frequency of Communication with Friends and Family: Twice a week    Frequency of Social Gatherings with Friends and Family: Once a week    Attends Religious Services: More than 4 times per year    Active Member of [employer] or Organizations: Yes    Attends Banker Meetings: More than 4 times per year    Marital Status: Married  Catering manager Violence: Not At Risk (02/15/2022)   Humiliation, Afraid, Rape, and Kick questionnaire    Fear of Current or Ex-Partner: No    Emotionally Abused: No    Physically Abused: No    Sexually Abused: No    Review of Systems Per hpi.   Objective:   Vitals:   06/19/23 0956  BP: 124/66  Pulse: 63  Temp: 98.3 F (36.8 C)  TempSrc: Temporal  SpO2: 97%  Weight: 248 lb 9.6 oz (112.8 kg)  Height: 5\' 8"  (1.727 m)     Physical Exam Vitals reviewed.  Constitutional:      Appearance: He is well-developed.  HENT:     Head: Normocephalic and atraumatic.  Neck:     Vascular: No carotid bruit or JVD.  Cardiovascular:     Rate and Rhythm: Normal rate and regular rhythm.     Heart sounds: Normal heart sounds. No murmur heard. Pulmonary:     Effort: Pulmonary effort is normal.     Breath sounds: Normal breath sounds. No rales.  Abdominal:     Tenderness: There is no abdominal tenderness.  Musculoskeletal:      Right lower leg: No edema.     Left lower leg: No edema.     Comments: Pain-free motion of right, left knee.  No pedal edema appreciated.  Skin:    General: Skin is warm and dry.  Neurological:     Mental Status: He is alert and oriented to person, place, and time.  Psychiatric:        Mood and Affect: Mood normal.        Assessment & Plan:  Daniel [name] is a 72 y.o. male . Type 2 diabetes mellitus with diabetic polyneuropathy, without long-term current use of insulin (HCC)  -Under care of endocrinology with recent medication adjustment, continue follow-up with endocrine for further changes.  Depression, unspecified depression type - Plan: sertraline (ZOLOFT) 100 MG tablet  -Stable on higher dose sertraline, continue same.  Prolactinoma (HCC)  -Followed by endocrinology with recent medication changes as well as recommendations from surgeon.  Continue follow-up with both specialist as planned.  Chronic pain of both knees  -Stable with some improvement in right knee, still intermittent need for tramadol as above, okay to refill for now with continued monitoring every 3 to 6 months.  Option of Ortho eval if considering replacement, or worsening  symptoms.  Anticipate will need improved diabetes control prior to surgery if that were considered.  Essential hypertension  -Stable on current regimen, continue same.  Hypogonadism male  -Followed by urology, continue same.  Hypercholesteremia - Plan: Comprehensive metabolic panel, Lipid panel, pravastatin (PRAVACHOL) 80 MG tablet  -Tolerating current dose pravastatin, check labs and adjust plan accordingly  Mild intermittent asthma without complication - Plan: montelukast (SINGULAIR) 10 MG tablet  -Stable with Singulair, refilled.  RTC precautions.  Meds ordered this encounter  Medications   montelukast (SINGULAIR) 10 MG tablet    Sig: Take 1 tablet (10 mg total) by mouth at bedtime.    Dispense:  90 tablet    Refill:  2    pravastatin (PRAVACHOL) 80 MG tablet    Sig: Take 1 tablet (80 mg total) by mouth daily.    Dispense:  90 tablet    Refill:  1   sertraline (ZOLOFT) 100 MG tablet    Sig: Take 2 tablets (200 mg total) by mouth daily. 2 tablets daily.    Dispense:  180 tablet    Refill:  1   Patient Instructions  No med changes for me at this time.  Keep follow-up with specialists as planned.  Hopefully recent changes will help with improved control for diabetes, and thyroid.  If any concerns on labs today I will let you know.  Let me know when next refill needed for tramadol but glad to hear that that is needed less recently. Take care!    Signed,   Meredith Staggers, MD [facility], [facility] 06/19/23 10:49 AM

## 2023-06-19 NOTE — Patient Instructions (Signed)
No med changes for me at this time.  Keep follow-up with specialists as planned.  Hopefully recent changes will help with improved control for diabetes, and thyroid.  If any concerns on labs today I will let you know.  Let me know when next refill needed for tramadol but glad to hear that that is needed less recently. Take care!

## 2023-06-22 ENCOUNTER — Encounter: Payer: Self-pay | Admitting: Family Medicine

## 2023-06-23 ENCOUNTER — Encounter: Payer: Self-pay | Admitting: Family Medicine

## 2023-06-23 DIAGNOSIS — E7800 Pure hypercholesterolemia, unspecified: Secondary | ICD-10-CM

## 2023-06-24 MED ORDER — ROSUVASTATIN CALCIUM 10 MG PO TABS: 10.0000 mg | ORAL_TABLET | Freq: Every day | ORAL | 1 refills | Status: DC

## 2023-06-24 NOTE — Telephone Encounter (Signed)
Noted.  Will initially switch him to Crestor 10 mg, recheck labs in 6 weeks, lab visit is fine.  CMP and lipid panel for hyperlipidemia.  Likely will need 20 mg dose but lets make sure he tolerates the 10 mg dose first. Med and labs ordered - please schedule lab visit. Thanks.

## 2023-06-24 NOTE — Telephone Encounter (Signed)
Patient is okay with changing Rx for hyperlipidemia, see Crestor but no directions for what dose, please advise

## 2023-06-25 NOTE — Telephone Encounter (Signed)
Pt has been scheduled and no concerns currently

## 2023-07-05 ENCOUNTER — Other Ambulatory Visit: Payer: Self-pay | Admitting: Family Medicine

## 2023-07-05 DIAGNOSIS — G8929 Other chronic pain: Secondary | ICD-10-CM

## 2023-07-05 NOTE — Telephone Encounter (Signed)
 Medication and plan discussed at December 18 visit.  Controlled substance database reviewed.  Tramadol No. 120 on 04/26/2023.  Previously 02/28/2023.  Refill ordered.

## 2023-07-11 ENCOUNTER — Encounter: Payer: Self-pay | Admitting: Family Medicine

## 2023-07-11 ENCOUNTER — Ambulatory Visit (INDEPENDENT_AMBULATORY_CARE_PROVIDER_SITE_OTHER): Payer: PPO | Admitting: Family Medicine

## 2023-07-11 VITALS — BP 124/70 | HR 75 | Temp 97.9°F | Ht 68.0 in | Wt 248.0 lb

## 2023-07-11 DIAGNOSIS — R0981 Nasal congestion: Secondary | ICD-10-CM

## 2023-07-11 DIAGNOSIS — Z2089 Contact with and (suspected) exposure to other communicable diseases: Secondary | ICD-10-CM

## 2023-07-11 DIAGNOSIS — R059 Cough, unspecified: Secondary | ICD-10-CM | POA: Diagnosis not present

## 2023-07-11 LAB — POCT INFLUENZA A/B
Influenza A, POC: NEGATIVE
Influenza B, POC: NEGATIVE

## 2023-07-11 LAB — POC COVID19 BINAXNOW: SARS Coronavirus 2 Ag: NEGATIVE

## 2023-07-11 MED ORDER — AZITHROMYCIN 250 MG PO TABS: ORAL_TABLET | ORAL | 0 refills | Status: AC

## 2023-07-11 NOTE — Progress Notes (Signed)
 Subjective:  Patient ID: Daniel [name], male    DOB: [birth year]  Age: 73 y.o. MRN: [id number]  CC:  Chief Complaint  Patient presents with   Cough    Congestion, cough, sore throat, fatigue, sxs starting Sunday, has been taking mucinex  and drinking plenty of fluid,     HPI Daniel [name] presents for   Acute symptoms as above. Cough: Started 4 days ago.  Sore throat, cough, fatigue. No known sick contacts. Saw grandson over new years  - not sick at that time, but 3 grandsons diagnosed with walking PNA few days ago and started on abx. They are doing better.   No fever. Only dyspneic with coughing spell. Not otherwise.  No chest pain.  Feeling minimally better today vs. yesterday.   Has been treating with Mucinex , and drinking fluids.  He did receive flu vaccine in September 2024   History 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   Trigger finger, left little finger 02/04/2020   Carpal tunnel syndrome, left upper limb 11/24/2019   Unilateral primary osteoarthritis, left knee 11/02/2019   Unilateral primary osteoarthritis, right knee 11/02/2019   Carpal tunnel syndrome, right upper limb 02/18/2018   Anxiety and depression 10/28/2016   Gastroesophageal reflux disease without esophagitis 10/28/2016   Seasonal allergic rhinitis due to pollen 10/28/2016   Primary osteoarthritis of left knee 07/25/2015   Essential hypertension 07/25/2015   Hypercholesteremia 07/25/2015   Type 2 diabetes mellitus with diabetic polyneuropathy, without long-term current use of insulin  (HCC) 07/25/2015   Pollen allergies 02/10/2014   Allergic rhinitis due to animal (cat) (dog) hair and dander 09/15/2012   Extrinsic asthma 09/13/2011   Proteinuria 11/24/2010   Past Medical History:  Diagnosis Date   Allergic rhinitis    Allergy    Anxiety    Arthritis    Benign hypertension    Cataract    Depression    Diabetes mellitus (HCC)     type 2    GERD (gastroesophageal reflux disease)    History of pneumonia    HLD (hyperlipidemia)    Neuromuscular disorder (HCC)    pt denies at 8/19/visit    Pneumonia    hx of several times per pt - last episode 4-5 years ago    Past Surgical History:  Procedure Laterality Date   CARPAL TUNNEL RELEASE Bilateral 2020 Right, 2021 left   CATARACT EXTRACTION     bilateral   EYE SURGERY     KNEE SURGERY     right   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   Procedure: LEFT TOTAL KNEE ARTHROPLASTY;  Surgeon: Vernetta Lonni GRADE, MD;  Location: [facility];  Service: Orthopedics;  Laterality: Left;   Allergies  Allergen Reactions   Codeine Palpitations, Shortness Of Breath and Other (See Comments)   Pioglitazone Other (See Comments), Shortness Of Breath and Swelling    Blood sugar went up and down and sweating profusely   Prior to Admission medications   Medication Sig Start Date End Date Taking? Authorizing Provider  albuterol  (VENTOLIN  HFA) 108 (90 Base) MCG/ACT inhaler INHALE 2 PUFFS BY MOUTH EVERY 6 HOURS AS NEEDED FOR WHEEZING OR SHORTNESS OF BREATH 03/15/22  Yes Levora Reyes SAUNDERS, MD  aspirin  EC 81 MG tablet Take by mouth. 10/17/13  Yes [provider]  cabergoline  (DOSTINEX ) 0.5 MG tablet Take 0.25 mg by mouth 2 (two) times a week. Take twice a week   Yes  [provider]  Cholecalciferol  (VITAMIN D ) 125 MCG (5000 UT) CAPS Take 5,000 Units by mouth daily.   Yes [provider]  Cyanocobalamin (VITAMIN B 12 PO) Take 1,000 mg by mouth daily.    Yes [provider]  ferrous sulfate  325 (65 FE) MG EC tablet Take 1 tablet (325 mg total) by mouth daily with breakfast. 10/05/21  Yes Armbruster, Elspeth SQUIBB, MD  fluticasone  (FLONASE ) 50 MCG/ACT nasal spray Place 1 spray into both nostrils daily.   Yes [provider]  GEMTESA 75 MG TABS  02/11/23  Yes [provider]  glucose blood test strip Check glucose twice daily. 10/20/13  Yes [provider]  HUMALOG MIX 75/25 KWIKPEN (75-25) 100 UNIT/ML KwikPen 10u Subcutaneous twice a day before meals for 30 days 06/18/23  Yes [provider]  Insulin  Pen Needle (PEN NEEDLES) 32G X 4 MM MISC 1 Application by Does not apply route daily. 06/20/22  Yes Levora Reyes SAUNDERS, MD  Lancets [facility] DELICA PLUS LANCET33G) MISC USE 1 TO CHECK GLUCOSE ONCE DAILY 08/04/18  Yes [provider]  levothyroxine  (SYNTHROID ) 50 MCG tablet Take 50 mcg by mouth daily before breakfast.   Yes [provider]  lisinopril  (ZESTRIL ) 20 MG tablet Take 1 tablet (20 mg total) by mouth daily. 12/24/22  Yes Levora Reyes SAUNDERS, MD  loratadine (CLARITIN) 10 MG tablet Take by mouth. 10/17/13  Yes [provider]  montelukast  (SINGULAIR ) 10 MG tablet Take 1 tablet (10 mg total) by mouth at bedtime. 06/19/23  Yes Levora Reyes SAUNDERS, MD  Multiple Vitamin (MULTIVITAMIN WITH MINERALS) TABS tablet Take 1 tablet by mouth daily.   Yes [provider]  mupirocin  ointment (BACTROBAN ) 2 % Apply 1 Application topically 2 (two) times daily. For 1 week 10/11/22  Yes Levora Reyes SAUNDERS, MD  ondansetron  (ZOFRAN ) 4 MG tablet Take 1 tablet (4 mg total) by mouth every 8 (eight) hours as needed for nausea or vomiting. 09/20/22  Yes Levora Reyes SAUNDERS, MD  [facility] ULTRA test strip USE 1 STRIP TO CHECK GLUCOSE ONCE DAILY 11/25/18  Yes Levora Reyes SAUNDERS, MD  pregabalin  (LYRICA ) 100 MG capsule Take 100 mg by mouth 2 (two) times daily. One capsule by mouth twice a day   Yes [provider]  rosuvastatin  (CRESTOR ) 10 MG tablet Take 1 tablet (10 mg total) by mouth daily. 06/24/23  Yes Levora Reyes SAUNDERS, MD  sertraline  (ZOLOFT ) 100 MG tablet Take 2 tablets (200 mg total) by mouth daily. 2 tablets daily. 06/19/23  Yes Levora Reyes SAUNDERS, MD  Testosterone  1.62 % GEL Apply 1 Application topically daily. 11/29/22  Yes [provider]  traMADol  (ULTRAM ) 50 MG tablet TAKE 1 TABLET BY MOUTH EVERY 6 HOURS  AS NEEDED 07/05/23  Yes Levora Reyes SAUNDERS, MD  vitamin E  180 MG (400 UNITS) capsule Take 400 Units by mouth daily.   Yes [provider]   Social History   Socioeconomic History   Marital status: Married    Spouse name: Not on file   Number of children: 3   Years of education: 16   Highest education level: Bachelor's degree (e.g., BA, AB, BS)  Occupational History   Occupation: photographer, optician, dispensing  Tobacco Use   Smoking status: Never   Smokeless tobacco: Never  Vaping Use   Vaping status: Never Used  Substance and Sexual Activity   Alcohol use: No   Drug use: No   Sexual activity: Not Currently  Other Topics Concern  Not on file  Social History Narrative   Caffiene decaff tea and coffee, occ diet soda.   Social Drivers of Corporate Investment Banker Strain: Low Risk  (02/28/2023)   Overall Financial Resource Strain (CARDIA)    Difficulty of Paying Living Expenses: Not hard at all  Food Insecurity: No Food Insecurity (02/28/2023)   Hunger Vital Sign    Worried About Running Out of Food in the Last Year: Never true    Ran Out of Food in the Last Year: Never true  Transportation Needs: No Transportation Needs (02/28/2023)   PRAPARE - Administrator, Civil Service (Medical): No    Lack of Transportation (Non-Medical): No  Physical Activity: Insufficiently Active (02/28/2023)   Exercise Vital Sign    Days of Exercise per Week: 1 day    Minutes of Exercise per Session: 20 min  Stress: No Stress Concern Present (02/28/2023)   [employer] of Occupational Health - Occupational Stress Questionnaire    Feeling of Stress : Not at all  Social Connections: Socially Integrated (02/28/2023)   Social Connection and Isolation Panel [NHANES]    Frequency of Communication with Friends and Family: Twice a week    Frequency of Social Gatherings with Friends and Family: Once a week    Attends Religious Services: More than 4 times per year    Active Member of  [employer] or Organizations: Yes    Attends Banker Meetings: More than 4 times per year    Marital Status: Married  Catering Manager Violence: Not At Risk (02/15/2022)   Humiliation, Afraid, Rape, and Kick questionnaire    Fear of Current or Ex-Partner: No    Emotionally Abused: No    Physically Abused: No    Sexually Abused: No    Review of Systems Per HPI.   Objective:   Vitals:   07/11/23 1117  BP: 124/70  Pulse: 75  Temp: 97.9 F (36.6 C)  TempSrc: Temporal  SpO2: 97%  Weight: 248 lb (112.5 kg)  Height: 5' 8 (1.727 m)    Physical Exam Vitals reviewed.  Constitutional:      Appearance: He is well-developed.  HENT:     Head: Normocephalic and atraumatic.     Right Ear: Tympanic membrane, ear canal and external ear normal.     Left Ear: Tympanic membrane, ear canal and external ear normal.     Nose: No rhinorrhea.     Mouth/Throat:     Pharynx: No oropharyngeal exudate or posterior oropharyngeal erythema.  Eyes:     Conjunctiva/sclera: Conjunctivae normal.     Pupils: Pupils are equal, round, and reactive to light.  Cardiovascular:     Rate and Rhythm: Normal rate and regular rhythm.     Heart sounds: Normal heart sounds. No murmur heard. Pulmonary:     Effort: Pulmonary effort is normal.     Breath sounds: Normal breath sounds. No wheezing, rhonchi or rales.  Abdominal:     Palpations: Abdomen is soft.     Tenderness: There is no abdominal tenderness.  Musculoskeletal:     Cervical back: Neck supple.  Lymphadenopathy:     Cervical: No cervical adenopathy.  Skin:    General: Skin is warm and dry.     Findings: No rash.  Neurological:     Mental Status: He is alert and oriented to person, place, and time.  Psychiatric:        Behavior: Behavior normal.     Results for orders  placed or performed in visit on 07/11/23  POC COVID-19 BinaxNow   Collection Time: 07/11/23 11:34 AM  Result Value Ref Range   SARS Coronavirus 2 Ag Negative Negative   POCT Influenza A/B   Collection Time: 07/11/23 11:37 AM  Result Value Ref Range   Influenza A, POC Negative Negative   Influenza B, POC Negative Negative    Assessment & Plan:  Daniel [name] is a 73 y.o. male . Congestion of nasal sinus - Plan: POC COVID-19 BinaxNow, POCT Influenza A/B, azithromycin  (ZITHROMAX ) 250 MG tablet  Cough, unspecified type - Plan: azithromycin  (ZITHROMAX ) 250 MG tablet  Exposure to pneumonia - Plan: azithromycin  (ZITHROMAX ) 250 MG tablet  Overall reassuring exam with some improvement today.  He does have a remote history of pneumonia, and recent exposure to grandkids with walking pneumonia.  Possible few coarse breath sounds on the right lower lobe but clears on repeat exam.  Afebrile, O2 sat normal and slight improvement today is all reassuring.  Azithromycin  was prescribed if he is not continuing to improve tonight into tomorrow to cover for atypicals, with potential side effects and risks of antibiotics discussed, RTC precautions given.  Meds ordered this encounter  Medications   azithromycin  (ZITHROMAX ) 250 MG tablet    Sig: Take 2 tablets on day 1, then 1 tablet daily on days 2 through 5    Dispense:  6 tablet    Refill:  0   Patient Instructions  Sorry to hear that you and the grandchildren have been sick.  Exam was reassuring today and your flu and COVID test were both negative or normal.  I did send in the azithromycin  antibiotic to start if you are not continuing to improve into tomorrow any worsening tonight given the exposure to pneumonia.  If you are continue to improve into tomorrow I think it would be reasonable to continue to watch your symptoms, Mucinex , fluids, and rest.  If any worsening symptoms, including any worsening on antibiotic be seen.  I do not expect that to occur.  Take care.   Cough, Adult Coughing is a reflex that clears your throat and airways (respiratory system). It helps heal and protect your lungs. It is normal to cough from  time to time. A cough that happens with other symptoms or that lasts a long time may be a sign of a condition that needs treatment. A short-term (acute) cough may only last 2-3 weeks. A long-term (chronic) cough may last 8 or more weeks. Coughing is often caused by: Diseases, such as: An infection of the respiratory system. Asthma or other heart or lung diseases. Gastroesophageal reflux. This is when acid comes back up from the stomach. Breathing in things that irritate your lungs. Allergies. Postnasal drip. This is when mucus runs down the back of your throat. Smoking. Some medicines. Follow these instructions at home: Medicines Take over-the-counter and prescription medicines only as told by your health care provider. Talk with your provider before you take cough medicine (cough suppressants). Eating and drinking Do not drink alcohol. Avoid caffeine. Drink enough fluid to keep your pee (urine) pale yellow. Lifestyle Avoid cigarette smoke. Do not use any products that contain nicotine or tobacco. These products include cigarettes, chewing tobacco, and vaping devices, such as e-cigarettes. If you need help quitting, ask your provider. Avoid things that make you cough. These may include perfumes, candles, cleaning products, or campfire smoke. General instructions  Watch for any changes to your cough. Tell your provider about them. Always  cover your mouth when you cough. If the air is dry in your bedroom or home, use a cool mist vaporizer or humidifier. If your cough is worse at night, try to sleep in a semi-upright position. Rest as needed. Contact a health care provider if: You have new symptoms, or your symptoms get worse. You cough up pus. You have a fever that does not go away or a cough that does not get better after 2-3 weeks. You cannot control your cough with medicine, and you are losing sleep. You have pain that gets worse or is not helped with medicine. You lose weight  for no clear reason. You have night sweats. Get help right away if: You cough up blood. You have trouble breathing. Your heart is beating very fast. These symptoms may be an emergency. Get help right away. Call 911. Do not wait to see if the symptoms will go away. Do not drive yourself to the hospital. This information is not intended to replace advice given to you by your health care provider. Make sure you discuss any questions you have with your health care provider. Document Revised: 02/16/2022 Document Reviewed: 02/16/2022 Elsevier Patient Education  2024 Elsevier Inc.     Signed,   Reyes Pines, MD [facility], [facility] 07/11/23 12:07 PM

## 2023-07-11 NOTE — Patient Instructions (Signed)
 Sorry to hear that you and the grandchildren have been sick.  Exam was reassuring today and your flu and COVID test were both negative or normal.  I did send in the azithromycin  antibiotic to start if you are not continuing to improve into tomorrow any worsening tonight given the exposure to pneumonia.  If you are continue to improve into tomorrow I think it would be reasonable to continue to watch your symptoms, Mucinex , fluids, and rest.  If any worsening symptoms, including any worsening on antibiotic be seen.  I do not expect that to occur.  Take care.   Cough, Adult Coughing is a reflex that clears your throat and airways (respiratory system). It helps heal and protect your lungs. It is normal to cough from time to time. A cough that happens with other symptoms or that lasts a long time may be a sign of a condition that needs treatment. A short-term (acute) cough may only last 2-3 weeks. A long-term (chronic) cough may last 8 or more weeks. Coughing is often caused by: Diseases, such as: An infection of the respiratory system. Asthma or other heart or lung diseases. Gastroesophageal reflux. This is when acid comes back up from the stomach. Breathing in things that irritate your lungs. Allergies. Postnasal drip. This is when mucus runs down the back of your throat. Smoking. Some medicines. Follow these instructions at home: Medicines Take over-the-counter and prescription medicines only as told by your health care provider. Talk with your provider before you take cough medicine (cough suppressants). Eating and drinking Do not drink alcohol. Avoid caffeine. Drink enough fluid to keep your pee (urine) pale yellow. Lifestyle Avoid cigarette smoke. Do not use any products that contain nicotine or tobacco. These products include cigarettes, chewing tobacco, and vaping devices, such as e-cigarettes. If you need help quitting, ask your provider. Avoid things that make you cough. These may  include perfumes, candles, cleaning products, or campfire smoke. General instructions  Watch for any changes to your cough. Tell your provider about them. Always cover your mouth when you cough. If the air is dry in your bedroom or home, use a cool mist vaporizer or humidifier. If your cough is worse at night, try to sleep in a semi-upright position. Rest as needed. Contact a health care provider if: You have new symptoms, or your symptoms get worse. You cough up pus. You have a fever that does not go away or a cough that does not get better after 2-3 weeks. You cannot control your cough with medicine, and you are losing sleep. You have pain that gets worse or is not helped with medicine. You lose weight for no clear reason. You have night sweats. Get help right away if: You cough up blood. You have trouble breathing. Your heart is beating very fast. These symptoms may be an emergency. Get help right away. Call 911. Do not wait to see if the symptoms will go away. Do not drive yourself to the hospital. This information is not intended to replace advice given to you by your health care provider. Make sure you discuss any questions you have with your health care provider. Document Revised: 02/16/2022 Document Reviewed: 02/16/2022 Elsevier Patient Education  2024 [employer].

## 2023-07-23 DIAGNOSIS — E221 Hyperprolactinemia: Secondary | ICD-10-CM | POA: Diagnosis not present

## 2023-07-23 DIAGNOSIS — N3941 Urge incontinence: Secondary | ICD-10-CM | POA: Diagnosis not present

## 2023-07-31 DIAGNOSIS — H40013 Open angle with borderline findings, low risk, bilateral: Secondary | ICD-10-CM | POA: Diagnosis not present

## 2023-08-01 ENCOUNTER — Telehealth: Payer: Self-pay | Admitting: Family Medicine

## 2023-08-01 NOTE — Telephone Encounter (Signed)
Copied from CRM [id number]. Topic: Clinical - Medical Advice >> Aug 01, 2023  9:00 AM Louie Boston wrote: Reason for CRM: Patient called in and wanted to know if he could continue taking the zpack that was provided to him at his last appointment. Patient stated he feel that the congestion is coming back and would like to take the zpack. Please contact patient at [phone].

## 2023-08-01 NOTE — Telephone Encounter (Signed)
Patient notes his congestion is coming back and he is requesting a Zpak continuation

## 2023-08-01 NOTE — Telephone Encounter (Signed)
Left vm to call office

## 2023-08-01 NOTE — Telephone Encounter (Signed)
Last visit 3 weeks ago.  Given that time and new symptoms I do recommend an evaluation to determine if other testing needed or antibiotic needed.  Please schedule visit or virtual visit at minimum.  Thank you.

## 2023-08-02 ENCOUNTER — Ambulatory Visit (INDEPENDENT_AMBULATORY_CARE_PROVIDER_SITE_OTHER): Payer: PPO | Admitting: Physician Assistant

## 2023-08-02 ENCOUNTER — Encounter: Payer: Self-pay | Admitting: Physician Assistant

## 2023-08-02 VITALS — BP 138/80 | HR 73 | Temp 97.3°F | Ht 68.0 in | Wt 254.2 lb

## 2023-08-02 DIAGNOSIS — R0981 Nasal congestion: Secondary | ICD-10-CM | POA: Diagnosis not present

## 2023-08-02 MED ORDER — ALBUTEROL SULFATE HFA 108 (90 BASE) MCG/ACT IN AERS
INHALATION_SPRAY | RESPIRATORY_TRACT | 0 refills | Status: AC
Start: 2023-08-02 — End: ?

## 2023-08-02 MED ORDER — DOXYCYCLINE HYCLATE 100 MG PO TABS: 100.0000 mg | ORAL_TABLET | Freq: Two times a day (BID) | ORAL | 0 refills | Status: DC

## 2023-08-02 NOTE — Progress Notes (Signed)
Daniel [name] is a 73 y.o. male here for a new problem.  History of Present Illness:   Chief Complaint  Patient presents with   Cough    Pt c/o cough x 2 weeks, congestion. He started a Z-pak on Thurs but did finish it.    HPI  Cough: Pt complains of cough, congestion, rhinorrhea, sinus pressure, headaches starting 2 weeks ago.  He was seen by Dr. Neva Seat on 07/11/2023 and was given Z-pak.  States he was unable to take Z-pak over the weekend, went out of town and forgot Z-pak at home. He reports that he only took the first dose of this. His symptom(s) improved while on his trip.  Since returning from his trip his symptoms have returned, especially last night.  No new symptoms but feels mostly sinus pressure. Has been taking Nyquil.  Only uses his Albuterol when he doing strenuous activity.   Past Medical History:  Diagnosis Date   Allergic rhinitis    Allergy    Anxiety    Arthritis    Benign hypertension    Cataract    Depression    Diabetes mellitus (HCC)    type 2    GERD (gastroesophageal reflux disease)    History of pneumonia    HLD (hyperlipidemia)    Neuromuscular disorder (HCC)    pt denies at 8/19/visit    Pneumonia    hx of several times per pt - last episode 4-5 years ago      Social History   Tobacco Use   Smoking status: Never   Smokeless tobacco: Never  Vaping Use   Vaping status: Never Used  Substance Use Topics   Alcohol use: No   Drug use: No    Past Surgical History:  Procedure Laterality Date   CARPAL TUNNEL RELEASE Bilateral 2020 Right, 2021 left   CATARACT EXTRACTION     bilateral   EYE SURGERY     KNEE SURGERY     right   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   Procedure: LEFT TOTAL KNEE ARTHROPLASTY;  Surgeon: Kathryne Hitch, MD;  Location: [facility];  Service: Orthopedics;  Laterality: Left;    Family History  Problem Relation Age of Onset   Diabetes Father    Colon polyps Father    Atrial fibrillation Mother     Prostate cancer Paternal Uncle    Cancer Sister    Esophageal cancer Neg Hx    Rectal cancer Neg Hx    Stomach cancer Neg Hx     Allergies  Allergen Reactions   Codeine Palpitations, Shortness Of Breath and Other (See Comments)   Pioglitazone Other (See Comments), Shortness Of Breath and Swelling    Blood sugar went up and down and sweating profusely    Current Medications:   Current Outpatient Medications:    aspirin EC 81 MG tablet, Take by mouth., Disp: , Rfl:    cabergoline (DOSTINEX) 0.5 MG tablet, Take 0.25 mg by mouth 2 (two) times a week. Take twice a week, Disp: , Rfl:    Cholecalciferol (VITAMIN D) 125 MCG (5000 UT) CAPS, Take 5,000 Units by mouth daily., Disp: , Rfl:    Cyanocobalamin (VITAMIN B 12 PO), Take 1,000 mg by mouth daily. , Disp: , Rfl:    doxycycline (VIBRA-TABS) 100 MG tablet, Take 1 tablet (100 mg total) by mouth 2 (two) times daily., Disp: 14 tablet, Rfl: 0   ferrous sulfate 325 (65 FE) MG EC tablet, Take 1 tablet (325  mg total) by mouth daily with breakfast., Disp: 30 tablet, Rfl: 0   fluticasone (FLONASE) 50 MCG/ACT nasal spray, Place 1 spray into both nostrils daily., Disp: , Rfl:    GEMTESA 75 MG TABS, , Disp: , Rfl:    glucose blood test strip, Check glucose twice daily., Disp: , Rfl:    HUMALOG MIX 75/25 KWIKPEN (75-25) 100 UNIT/ML KwikPen, 40 Units 2 (two) times daily before a meal., Disp: , Rfl:    Insulin Pen Needle (PEN NEEDLES) 32G X 4 MM MISC, 1 Application by Does not apply route daily., Disp: 100 each, Rfl: 3   Lancets (ONETOUCH DELICA PLUS LANCET33G) MISC, USE 1 TO CHECK GLUCOSE ONCE DAILY, Disp: , Rfl:    levothyroxine (SYNTHROID) 50 MCG tablet, Take 50 mcg by mouth daily before breakfast., Disp: , Rfl:    lisinopril (ZESTRIL) 20 MG tablet, Take 1 tablet (20 mg total) by mouth daily., Disp: 90 tablet, Rfl: 3   loratadine (CLARITIN) 10 MG tablet, Take by mouth., Disp: , Rfl:    montelukast (SINGULAIR) 10 MG tablet, Take 1 tablet (10 mg total)  by mouth at bedtime., Disp: 90 tablet, Rfl: 2   Multiple Vitamin (MULTIVITAMIN WITH MINERALS) TABS tablet, Take 1 tablet by mouth daily., Disp: , Rfl:    mupirocin ointment (BACTROBAN) 2 %, Apply 1 Application topically 2 (two) times daily. For 1 week, Disp: 22 g, Rfl: 0   ondansetron (ZOFRAN) 4 MG tablet, Take 1 tablet (4 mg total) by mouth every 8 (eight) hours as needed for nausea or vomiting., Disp: 10 tablet, Rfl: 0   ONETOUCH ULTRA test strip, USE 1 STRIP TO CHECK GLUCOSE ONCE DAILY, Disp: 100 each, Rfl: 1   pregabalin (LYRICA) 100 MG capsule, Take 100 mg by mouth 2 (two) times daily. One capsule by mouth twice a day, Disp: , Rfl:    rosuvastatin (CRESTOR) 10 MG tablet, Take 1 tablet (10 mg total) by mouth daily., Disp: 90 tablet, Rfl: 1   sertraline (ZOLOFT) 100 MG tablet, Take 2 tablets (200 mg total) by mouth daily. 2 tablets daily., Disp: 180 tablet, Rfl: 1   Testosterone 1.62 % GEL, Apply 1 Application topically daily., Disp: , Rfl:    traMADol (ULTRAM) 50 MG tablet, TAKE 1 TABLET BY MOUTH EVERY 6 HOURS AS NEEDED, Disp: 120 tablet, Rfl: 0   vitamin E 180 MG (400 UNITS) capsule, Take 400 Units by mouth daily., Disp: , Rfl:    albuterol (VENTOLIN HFA) 108 (90 Base) MCG/ACT inhaler, INHALE 2 PUFFS BY MOUTH EVERY 6 HOURS AS NEEDED FOR WHEEZING OR SHORTNESS OF BREATH, Disp: 9 g, Rfl: 0   Review of Systems:   Negative unless otherwise specified per HPI.  Vitals:   Vitals:   08/02/23 1343  BP: 138/80  Pulse: 73  Temp: (!) 97.3 F (36.3 C)  TempSrc: Temporal  SpO2: 98%  Weight: 254 lb 4 oz (115.3 kg)  Height: 5\' 8"  (1.727 m)     Body mass index is 38.66 kg/m.  Physical Exam:   Physical Exam Vitals and nursing note reviewed.  Constitutional:      General: He is not in acute distress.    Appearance: He is well-developed. He is not ill-appearing or toxic-appearing.  HENT:     Head: Normocephalic and atraumatic.     Right Ear: Tympanic membrane, ear canal and external ear  normal. Tympanic membrane is not erythematous, retracted or bulging.     Left Ear: Tympanic membrane, ear canal and external ear  normal. Tympanic membrane is not erythematous, retracted or bulging.     Nose:     Right Sinus: Frontal sinus tenderness present. No maxillary sinus tenderness.     Left Sinus: Frontal sinus tenderness present. No maxillary sinus tenderness.     Mouth/Throat:     Pharynx: Uvula midline. No posterior oropharyngeal erythema.  Eyes:     General: Lids are normal.     Conjunctiva/sclera: Conjunctivae normal.  Neck:     Trachea: Trachea normal.  Cardiovascular:     Rate and Rhythm: Normal rate and regular rhythm.     Pulses: Normal pulses.     Heart sounds: Normal heart sounds, S1 normal and S2 normal.  Pulmonary:     Effort: Pulmonary effort is normal.     Breath sounds: Normal breath sounds. No decreased breath sounds, wheezing, rhonchi or rales.  Lymphadenopathy:     Cervical: No cervical adenopathy.  Skin:    General: Skin is warm and dry.  Neurological:     Mental Status: He is alert.     GCS: GCS eye subscore is 4. GCS verbal subscore is 5. GCS motor subscore is 6.  Psychiatric:        Speech: Speech normal.        Behavior: Behavior normal. Behavior is cooperative.     Assessment and Plan:   Congestion of nasal sinus No red flags on exam.   Will initiate doxycycline per orders.  Refill albuterol today. Discussed taking medications as prescribed.  Reviewed return precautions including new or worsening fever, SOB, new or worsening cough or other concerns.  Push fluids and rest.  I recommend that patient follow-up if symptoms worsen or persist despite treatment x 7-10 days, sooner if needed.    I, Isabelle Course, acting as a Neurosurgeon for Jarold Motto, [place]., have documented all relevant documentation on the behalf of Jarold Motto, [place], as directed by  Jarold Motto, PA while in the presence of Jarold Motto, [place].  I, Jarold Motto, [place], have  reviewed all documentation for this visit. The documentation on 08/02/23 for the exam, diagnosis, procedures, and orders are all accurate and complete.  Jarold Motto, PA-C

## 2023-08-02 NOTE — Telephone Encounter (Signed)
Scheduled patient for a 1:40 pm appt with Jarold Motto at [facility]

## 2023-08-05 DIAGNOSIS — H6123 Impacted cerumen, bilateral: Secondary | ICD-10-CM | POA: Insufficient documentation

## 2023-08-05 DIAGNOSIS — H9193 Unspecified hearing loss, bilateral: Secondary | ICD-10-CM | POA: Insufficient documentation

## 2023-08-06 ENCOUNTER — Other Ambulatory Visit (INDEPENDENT_AMBULATORY_CARE_PROVIDER_SITE_OTHER): Payer: PPO

## 2023-08-06 ENCOUNTER — Telehealth: Payer: Self-pay

## 2023-08-06 DIAGNOSIS — E7800 Pure hypercholesterolemia, unspecified: Secondary | ICD-10-CM | POA: Diagnosis not present

## 2023-08-06 LAB — COMPREHENSIVE METABOLIC PANEL
ALT: 19 U/L (ref 0–53)
AST: 24 U/L (ref 0–37)
Albumin: 4.1 g/dL (ref 3.5–5.2)
Alkaline Phosphatase: 79 U/L (ref 39–117)
BUN: 30 mg/dL — ABNORMAL HIGH (ref 6–23)
CO2: 30 meq/L (ref 19–32)
Calcium: 8.9 mg/dL (ref 8.4–10.5)
Chloride: 104 meq/L (ref 96–112)
Creatinine, Ser: 1.66 mg/dL — ABNORMAL HIGH (ref 0.40–1.50)
GFR: 40.33 mL/min — ABNORMAL LOW (ref >60.00)
Glucose, Bld: 48 mg/dL — CL (ref 70–99)
Potassium: 4.4 meq/L (ref 3.5–5.1)
Sodium: 142 meq/L (ref 135–145)
Total Bilirubin: 0.3 mg/dL (ref 0.2–1.2)
Total Protein: 7.4 g/dL (ref 6.0–8.3)

## 2023-08-06 LAB — LIPID PANEL
Cholesterol: 145 mg/dL (ref 0–200)
HDL: 60.4 mg/dL (ref >39.00)
LDL Cholesterol: 69 mg/dL (ref 0–99)
NonHDL: 84.51
Total CHOL/HDL Ratio: 2
Triglycerides: 78 mg/dL (ref 0.0–149.0)
VLDL: 15.6 mg/dL (ref 0.0–40.0)

## 2023-08-06 NOTE — Telephone Encounter (Signed)
 See lab notes.  I called patient this afternoon.  He is feeling fine, blood sugar around 200, has been increasing since this morning.  He attributes low blood sugar this morning to fasting for labs today, less intake last night and changes in his insulin  regimen under the care of endocrinology.  He does report a few lows in the past few days and plans to call endocrinology to consider adjustment of regimen.  Will also be cautious with any further fasting lab work and may need to adjust his insulin  regimen prior if that is the case.

## 2023-08-06 NOTE — Telephone Encounter (Signed)
Sent to PCP as HP

## 2023-08-28 DIAGNOSIS — H5310 Unspecified subjective visual disturbances: Secondary | ICD-10-CM | POA: Diagnosis not present

## 2023-09-16 DIAGNOSIS — D352 Benign neoplasm of pituitary gland: Secondary | ICD-10-CM | POA: Diagnosis not present

## 2023-09-16 DIAGNOSIS — E291 Testicular hypofunction: Secondary | ICD-10-CM | POA: Diagnosis not present

## 2023-09-16 DIAGNOSIS — E1165 Type 2 diabetes mellitus with hyperglycemia: Secondary | ICD-10-CM | POA: Diagnosis not present

## 2023-09-23 DIAGNOSIS — E038 Other specified hypothyroidism: Secondary | ICD-10-CM | POA: Diagnosis not present

## 2023-09-23 DIAGNOSIS — I10 Essential (primary) hypertension: Secondary | ICD-10-CM | POA: Diagnosis not present

## 2023-09-23 DIAGNOSIS — D352 Benign neoplasm of pituitary gland: Secondary | ICD-10-CM | POA: Diagnosis not present

## 2023-09-23 DIAGNOSIS — E1165 Type 2 diabetes mellitus with hyperglycemia: Secondary | ICD-10-CM | POA: Diagnosis not present

## 2023-09-23 DIAGNOSIS — E7800 Pure hypercholesterolemia, unspecified: Secondary | ICD-10-CM | POA: Diagnosis not present

## 2023-09-23 DIAGNOSIS — E291 Testicular hypofunction: Secondary | ICD-10-CM | POA: Diagnosis not present

## 2023-09-23 DIAGNOSIS — D649 Anemia, unspecified: Secondary | ICD-10-CM | POA: Diagnosis not present

## 2023-09-23 DIAGNOSIS — E1142 Type 2 diabetes mellitus with diabetic polyneuropathy: Secondary | ICD-10-CM | POA: Diagnosis not present

## 2023-09-23 DIAGNOSIS — N189 Chronic kidney disease, unspecified: Secondary | ICD-10-CM | POA: Diagnosis not present

## 2023-10-15 ENCOUNTER — Other Ambulatory Visit: Payer: Self-pay | Admitting: Family Medicine

## 2023-10-15 DIAGNOSIS — G8929 Other chronic pain: Secondary | ICD-10-CM

## 2023-10-15 NOTE — Telephone Encounter (Signed)
 Medication discussed at 06/19/2023 visit.  Controlled substance database reviewed.  Last filled tramadol No. 120 on 07/05/2023.  Refill ordered.

## 2023-10-15 NOTE — Telephone Encounter (Signed)
 Requested Prescriptions   Pending Prescriptions Disp Refills   traMADol (ULTRAM) 50 MG tablet [Pharmacy Med Name: traMADol HCl 50 MG Oral Tablet] 120 tablet 0    Sig: TAKE 1 TABLET BY MOUTH EVERY 6 HOURS AS NEEDED     Date of patient request: 10/15/2023 Last office visit: 07/11/2023 Upcoming visit: Visit date not found Date of last refill: 07/05/2023 Last refill amount: 120

## 2023-10-17 DIAGNOSIS — D352 Benign neoplasm of pituitary gland: Secondary | ICD-10-CM | POA: Diagnosis not present

## 2023-10-21 DIAGNOSIS — N3941 Urge incontinence: Secondary | ICD-10-CM | POA: Diagnosis not present

## 2023-10-21 DIAGNOSIS — R350 Frequency of micturition: Secondary | ICD-10-CM | POA: Diagnosis not present

## 2023-10-21 DIAGNOSIS — R351 Nocturia: Secondary | ICD-10-CM | POA: Diagnosis not present

## 2023-10-24 DIAGNOSIS — E1165 Type 2 diabetes mellitus with hyperglycemia: Secondary | ICD-10-CM | POA: Diagnosis not present

## 2023-10-24 DIAGNOSIS — E7800 Pure hypercholesterolemia, unspecified: Secondary | ICD-10-CM | POA: Diagnosis not present

## 2023-10-24 DIAGNOSIS — I10 Essential (primary) hypertension: Secondary | ICD-10-CM | POA: Diagnosis not present

## 2023-10-24 DIAGNOSIS — E291 Testicular hypofunction: Secondary | ICD-10-CM | POA: Diagnosis not present

## 2023-10-24 DIAGNOSIS — E038 Other specified hypothyroidism: Secondary | ICD-10-CM | POA: Diagnosis not present

## 2023-10-24 DIAGNOSIS — E1142 Type 2 diabetes mellitus with diabetic polyneuropathy: Secondary | ICD-10-CM | POA: Diagnosis not present

## 2023-10-24 DIAGNOSIS — D649 Anemia, unspecified: Secondary | ICD-10-CM | POA: Diagnosis not present

## 2023-10-24 DIAGNOSIS — D352 Benign neoplasm of pituitary gland: Secondary | ICD-10-CM | POA: Diagnosis not present

## 2023-10-24 DIAGNOSIS — N189 Chronic kidney disease, unspecified: Secondary | ICD-10-CM | POA: Diagnosis not present

## 2023-10-28 ENCOUNTER — Ambulatory Visit (INDEPENDENT_AMBULATORY_CARE_PROVIDER_SITE_OTHER): Admitting: Family Medicine

## 2023-10-28 ENCOUNTER — Encounter: Payer: Self-pay | Admitting: Family Medicine

## 2023-10-28 VITALS — BP 122/78 | HR 62 | Temp 98.5°F | Ht 68.0 in | Wt 248.2 lb

## 2023-10-28 DIAGNOSIS — R509 Fever, unspecified: Secondary | ICD-10-CM | POA: Diagnosis not present

## 2023-10-28 DIAGNOSIS — E1142 Type 2 diabetes mellitus with diabetic polyneuropathy: Secondary | ICD-10-CM

## 2023-10-28 DIAGNOSIS — R110 Nausea: Secondary | ICD-10-CM

## 2023-10-28 DIAGNOSIS — R6883 Chills (without fever): Secondary | ICD-10-CM

## 2023-10-28 LAB — COMPREHENSIVE METABOLIC PANEL WITH GFR
ALT: 19 U/L (ref 0–53)
AST: 29 U/L (ref 0–37)
Albumin: 4.3 g/dL (ref 3.5–5.2)
Alkaline Phosphatase: 65 U/L (ref 39–117)
BUN: 22 mg/dL (ref 6–23)
CO2: 28 meq/L (ref 19–32)
Calcium: 9.5 mg/dL (ref 8.4–10.5)
Chloride: 98 meq/L (ref 96–112)
Creatinine, Ser: 1.47 mg/dL (ref 0.40–1.50)
GFR: 46.58 mL/min — ABNORMAL LOW (ref >60.00)
Glucose, Bld: 175 mg/dL — ABNORMAL HIGH (ref 70–99)
Potassium: 4.4 meq/L (ref 3.5–5.1)
Sodium: 134 meq/L — ABNORMAL LOW (ref 135–145)
Total Bilirubin: 0.3 mg/dL (ref 0.2–1.2)
Total Protein: 7.5 g/dL (ref 6.0–8.3)

## 2023-10-28 LAB — CBC
HCT: 39.7 % (ref 39.0–52.0)
Hemoglobin: 12.9 g/dL — ABNORMAL LOW (ref 13.0–17.0)
MCHC: 32.4 g/dL (ref 30.0–36.0)
MCV: 91 fl (ref 78.0–100.0)
Platelets: 315 10*3/uL (ref 150.0–400.0)
RBC: 4.36 Mil/uL (ref 4.22–5.81)
RDW: 14.6 % (ref 11.5–15.5)
WBC: 12.8 10*3/uL — ABNORMAL HIGH (ref 4.0–10.5)

## 2023-10-28 LAB — LIPASE: Lipase: 26 U/L (ref 11.0–59.0)

## 2023-10-28 NOTE — Patient Instructions (Addendum)
 Thank you for coming in today.  I am sorry did not feel well.  I suspect you may have had a viral illness and glad to hear that is starting to improve today.  I will check some labs including your blood counts, electrolytes.  Bland diet for now, continue to monitor your blood sugars, and if not continuing to improve the next few days, follow-up and we can discuss other possible causes.  Return to the clinic or go to the nearest emergency room if any of your symptoms worsen or new symptoms occur.

## 2023-10-28 NOTE — Progress Notes (Signed)
 Subjective:  Patient ID: BASSIL BHUIYAN, male    DOB: 10-16-1950  Age: 73 y.o. MRN: [id number]  CC:  Chief Complaint  Patient presents with   Fever    Pt notes Thursday last week started with nausea, constipation, feels hot and cold flashes, abnormal BG levels     HPI Orlinda Blackbird presents for   Above symptoms.  On and off constipation for awhile. Ate chicken at [facility] 4 days ago, noticed nausea few hours later - no vomiting.  Nausea all weekend with feeling hot and cold. Using miralax  and dulcolax chews for constipation - few BM"s yesterday, small BM this am - no straining, not hard stool.  Min runny nose/drainage  with allergies, no known sick contacts.   Not eating much, blood sugar dropped low to 54 yesterday afternoon - ate crackers, candy. Improved. Backed off insulin .  Peanut butter toast and cereal bar this morning, 15 units insulin  instead of usual 30-35.  CBG 250 after meal, 120 prior. Followed by Dr. Ronelle Coffee - appt Thursday morning, prolactin tumor improving - down to 75. Goal af around 50. Gemtesa from urology has been helpful past 69mo for nocturia.  No measured fever.   Feels somewhat better today versus this weekend. Less nausea/hot/cold feeling.   History 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   Trigger finger, left little finger 02/04/2020   Carpal tunnel syndrome, left upper limb 11/24/2019   Unilateral primary osteoarthritis, left knee 11/02/2019   Unilateral primary osteoarthritis, right knee 11/02/2019   Carpal tunnel syndrome, right upper limb 02/18/2018   Anxiety and depression 10/28/2016   Gastroesophageal reflux disease without esophagitis 10/28/2016   Seasonal allergic rhinitis due to pollen 10/28/2016   Primary osteoarthritis of left knee 07/25/2015   Essential hypertension 07/25/2015   Hypercholesteremia 07/25/2015   Type 2 diabetes mellitus with diabetic polyneuropathy, without long-term  current use of insulin  (HCC) 07/25/2015   Pollen allergies 02/10/2014   Allergic rhinitis due to animal (cat) (dog) hair and dander 09/15/2012   Extrinsic asthma 09/13/2011   Proteinuria 11/24/2010   Past Medical History:  Diagnosis Date   Allergic rhinitis    Allergy    Anxiety    Arthritis    Benign hypertension    Cataract    Depression    Diabetes mellitus (HCC)    type 2    GERD (gastroesophageal reflux disease)    History of pneumonia    HLD (hyperlipidemia)    Neuromuscular disorder (HCC)    pt denies at 8/19/visit    Pneumonia    hx of several times per pt - last episode 4-5 years ago    Past Surgical History:  Procedure Laterality Date   CARPAL TUNNEL RELEASE Bilateral 2020 Right, 2021 left   CATARACT EXTRACTION     bilateral   EYE SURGERY     KNEE SURGERY     right   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   Procedure: LEFT TOTAL KNEE ARTHROPLASTY;  Surgeon: Arnie Lao, MD;  Location: [facility];  Service: Orthopedics;  Laterality: Left;   Allergies  Allergen Reactions   Codeine Palpitations, Shortness Of Breath and Other (See Comments)   Pioglitazone Other (See Comments), Shortness Of Breath and Swelling    Blood sugar went up and down and sweating profusely   Prior to Admission medications   Medication Sig Start Date End Date Taking? Authorizing Provider  albuterol  (VENTOLIN  HFA) 108 (  90 Base) MCG/ACT inhaler INHALE 2 PUFFS BY MOUTH EVERY 6 HOURS AS NEEDED FOR WHEEZING OR SHORTNESS OF BREATH 08/02/23  Yes [name] Iba, PA  aspirin  EC 81 MG tablet Take by mouth. 10/17/13  Yes [provider]  cabergoline (DOSTINEX) 0.5 MG tablet Take 0.25 mg by mouth 2 (two) times a week. Take twice a week   Yes [provider]  Cholecalciferol  (VITAMIN D ) 125 MCG (5000 UT) CAPS Take 5,000 Units by mouth daily.   Yes [provider]  Cyanocobalamin (VITAMIN B 12 PO) Take 1,000 mg by mouth daily.    Yes [provider]   doxycycline  (VIBRA -TABS) 100 MG tablet Take 1 tablet (100 mg total) by mouth 2 (two) times daily. 08/02/23  Yes [name] Iba, PA  ferrous sulfate  325 (65 FE) MG EC tablet Take 1 tablet (325 mg total) by mouth daily with breakfast. 10/05/21  Yes Armbruster, Lendon Queen, MD  fluticasone  (FLONASE ) 50 MCG/ACT nasal spray Place 1 spray into both nostrils daily.   Yes [provider]  GEMTESA 75 MG TABS  02/11/23  Yes [provider]  glucose blood test strip Check glucose twice daily. 10/20/13  Yes [provider]  HUMALOG MIX 75/25 KWIKPEN (75-25) 100 UNIT/ML KwikPen 40 Units 2 (two) times daily before a meal. 06/18/23  Yes [provider]  Insulin  Pen Needle (PEN NEEDLES) 32G X 4 MM MISC 1 Application by Does not apply route daily. 06/20/22  Yes Benjiman Bras, MD  Lancets [facility] DELICA PLUS LANCET33G) MISC USE 1 TO CHECK GLUCOSE ONCE DAILY 08/04/18  Yes [provider]  levothyroxine (SYNTHROID) 50 MCG tablet Take 50 mcg by mouth daily before breakfast.   Yes [provider]  lisinopril  (ZESTRIL ) 20 MG tablet Take 1 tablet (20 mg total) by mouth daily. 12/24/22  Yes Benjiman Bras, MD  loratadine (CLARITIN) 10 MG tablet Take by mouth. 10/17/13  Yes [provider]  montelukast  (SINGULAIR ) 10 MG tablet Take 1 tablet (10 mg total) by mouth at bedtime. 06/19/23  Yes Benjiman Bras, MD  Multiple Vitamin (MULTIVITAMIN WITH MINERALS) TABS tablet Take 1 tablet by mouth daily.   Yes [provider]  mupirocin  ointment (BACTROBAN ) 2 % Apply 1 Application topically 2 (two) times daily. For 1 week 10/11/22  Yes Benjiman Bras, MD  ondansetron  (ZOFRAN ) 4 MG tablet Take 1 tablet (4 mg total) by mouth every 8 (eight) hours as needed for nausea or vomiting. 09/20/22  Yes Benjiman Bras, MD  [place] ULTRA test strip USE 1 STRIP TO CHECK GLUCOSE ONCE DAILY 11/25/18  Yes Benjiman Bras, MD  pregabalin (LYRICA) 100 MG capsule Take 100 mg  by mouth 2 (two) times daily. One capsule by mouth twice a day   Yes [provider]  rosuvastatin  (CRESTOR ) 10 MG tablet Take 1 tablet (10 mg total) by mouth daily. 06/24/23  Yes Benjiman Bras, MD  sertraline  (ZOLOFT ) 100 MG tablet Take 2 tablets (200 mg total) by mouth daily. 2 tablets daily. 06/19/23  Yes Benjiman Bras, MD  Testosterone  1.62 % GEL Apply 1 Application topically daily. 11/29/22  Yes [provider]  traMADol  (ULTRAM ) 50 MG tablet TAKE 1 TABLET BY MOUTH EVERY 6 HOURS AS NEEDED 10/15/23  Yes Benjiman Bras, MD  vitamin E  180 MG (400 UNITS) capsule Take 400 Units by mouth daily.   Yes [provider]   Social History   Socioeconomic History   Marital status: Married  Spouse name: Not on file   Number of children: 3   Years of education: 16   Highest education level: Bachelor's degree (e.g., BA, AB, BS)  Occupational History   Occupation: Photographer, Optician, dispensing  Tobacco Use   Smoking status: Never   Smokeless tobacco: Never  Vaping Use   Vaping status: Never Used  Substance and Sexual Activity   Alcohol use: No   Drug use: No   Sexual activity: Not Currently  Other Topics Concern   Not on file  Social History Narrative   Caffiene decaff tea and coffee, occ diet soda.   Social Drivers of Corporate investment banker Strain: Low Risk  (02/28/2023)   Overall Financial Resource Strain (CARDIA)    Difficulty of Paying Living Expenses: Not hard at all  Food Insecurity: No Food Insecurity (02/28/2023)   Hunger Vital Sign    Worried About Running Out of Food in the Last Year: Never true    Ran Out of Food in the Last Year: Never true  Transportation Needs: No Transportation Needs (02/28/2023)   PRAPARE - Administrator, Civil Service (Medical): No    Lack of Transportation (Non-Medical): No  Physical Activity: Insufficiently Active (02/28/2023)   Exercise Vital Sign    Days of Exercise per Week: 1 day    Minutes of  Exercise per Session: 20 min  Stress: No Stress Concern Present (02/28/2023)   [employer] of Occupational Health - Occupational Stress Questionnaire    Feeling of Stress : Not at all  Social Connections: Unknown (02/28/2023)   Social Connection and Isolation Panel [NHANES]    Frequency of Communication with Friends and Family: Twice a week    Frequency of Social Gatherings with Friends and Family: Once a week    Attends Religious Services: Not on Insurance claims handler of Clubs or Organizations: Yes    Attends Banker Meetings: More than 4 times per year    Marital Status: Married  Catering manager Violence: Not At Risk (02/15/2022)   Humiliation, Afraid, Rape, and Kick questionnaire    Fear of Current or Ex-Partner: No    Emotionally Abused: No    Physically Abused: No    Sexually Abused: No    Review of Systems   Objective:   Vitals:   10/28/23 1203  BP: 122/78  Pulse: 62  Temp: 98.5 F (36.9 C)  TempSrc: Temporal  SpO2: 97%  Weight: 248 lb 3.2 oz (112.6 kg)  Height: 5\' 8"  (1.727 m)     Physical Exam Vitals reviewed.  Constitutional:      Appearance: He is well-developed.  HENT:     Head: Normocephalic and atraumatic.  Neck:     Vascular: No carotid bruit or JVD.  Cardiovascular:     Rate and Rhythm: Normal rate and regular rhythm.     Heart sounds: Normal heart sounds. No murmur heard. Pulmonary:     Effort: Pulmonary effort is normal.     Breath sounds: Normal breath sounds. No rales.  Abdominal:     General: There is no distension.     Palpations: Abdomen is soft.     Tenderness: There is no abdominal tenderness. There is no guarding.  Musculoskeletal:     Right lower leg: No edema.     Left lower leg: No edema.  Skin:    General: Skin is warm and dry.  Neurological:     Mental Status: He is alert and oriented  to person, place, and time.  Psychiatric:        Mood and Affect: Mood normal.        Assessment & Plan:  LEEON RADON is a 73 y.o. male . Nausea - Plan: Comprehensive metabolic panel with GFR, CBC, Lipase  Subjective fever  Chills  Type 2 diabetes mellitus with diabetic polyneuropathy, without long-term current use of insulin  (HCC) - Plan: CANCELED: POCT glucose (manual entry)  4-day history of slight nausea, subjective fever, chills.  Decreased appetite with single episode of hypoglycemia.  Has adjusted regimen accordingly.  Some improvement in symptoms today.  Reassuring exam.  Reassuring vital signs.  Possible viral illness with improvement of symptoms but will check labs as above and adjust plan accordingly with RTC/ER precautions given.  Anticipate continued improvement.  Bland diet, fluids, relative rest.  No orders of the defined types were placed in this encounter.  Patient Instructions  Thank you for coming in today.  I am sorry did not feel well.  I suspect you may have had a viral illness and glad to hear that is starting to improve today.  I will check some labs including your blood counts, electrolytes.  Bland diet for now, continue to monitor your blood sugars, and if not continuing to improve the next few days, follow-up and we can discuss other possible causes.  Return to the clinic or go to the nearest emergency room if any of your symptoms worsen or new symptoms occur.     Signed,   Caro Christmas, MD [facility], [facility] 10/28/23 12:37 PM

## 2023-11-02 ENCOUNTER — Encounter: Payer: Self-pay | Admitting: Family Medicine

## 2023-11-26 ENCOUNTER — Telehealth: Payer: Self-pay

## 2023-11-26 NOTE — Telephone Encounter (Signed)
 Called and made medication management visit bc surgeon needs A1c before he can schedule surgery

## 2023-11-26 NOTE — Telephone Encounter (Signed)
 Noted appt 5/29.  Can check labs at that time.  Thanks

## 2023-11-26 NOTE — Telephone Encounter (Signed)
Patient needs an appointment for surgical clearance. 

## 2023-11-26 NOTE — Telephone Encounter (Signed)
 Copied from CRM [id number]. Topic: Clinical - Request for Lab/Test Order >> Nov 26, 2023 12:08 PM Dewanda Foots wrote: Reason for CRM: Pt states that he is going to have surgery on right knee and need A1C baseline for the ortho Dr. Lucienne Ryder with [facility].   Please call patient if we can get these ordered for him. [phone] and okay to leave vm.

## 2023-11-27 DIAGNOSIS — E291 Testicular hypofunction: Secondary | ICD-10-CM | POA: Insufficient documentation

## 2023-11-27 DIAGNOSIS — E1142 Type 2 diabetes mellitus with diabetic polyneuropathy: Secondary | ICD-10-CM | POA: Insufficient documentation

## 2023-11-27 DIAGNOSIS — E1165 Type 2 diabetes mellitus with hyperglycemia: Secondary | ICD-10-CM | POA: Insufficient documentation

## 2023-11-27 DIAGNOSIS — D352 Benign neoplasm of pituitary gland: Secondary | ICD-10-CM | POA: Insufficient documentation

## 2023-11-27 DIAGNOSIS — D649 Anemia, unspecified: Secondary | ICD-10-CM | POA: Insufficient documentation

## 2023-11-27 DIAGNOSIS — D497 Neoplasm of unspecified behavior of endocrine glands and other parts of nervous system: Secondary | ICD-10-CM | POA: Insufficient documentation

## 2023-11-27 DIAGNOSIS — E1159 Type 2 diabetes mellitus with other circulatory complications: Secondary | ICD-10-CM | POA: Insufficient documentation

## 2023-11-27 DIAGNOSIS — E038 Other specified hypothyroidism: Secondary | ICD-10-CM | POA: Insufficient documentation

## 2023-11-27 DIAGNOSIS — I10 Essential (primary) hypertension: Secondary | ICD-10-CM | POA: Insufficient documentation

## 2023-11-27 DIAGNOSIS — N189 Chronic kidney disease, unspecified: Secondary | ICD-10-CM | POA: Insufficient documentation

## 2023-11-28 ENCOUNTER — Ambulatory Visit: Admitting: Family Medicine

## 2023-11-28 VITALS — BP 136/74 | HR 70 | Temp 98.0°F | Resp 16 | Ht 68.0 in | Wt 248.0 lb

## 2023-11-28 DIAGNOSIS — D352 Benign neoplasm of pituitary gland: Secondary | ICD-10-CM | POA: Diagnosis not present

## 2023-11-28 DIAGNOSIS — E291 Testicular hypofunction: Secondary | ICD-10-CM

## 2023-11-28 DIAGNOSIS — K5900 Constipation, unspecified: Secondary | ICD-10-CM

## 2023-11-28 DIAGNOSIS — E1142 Type 2 diabetes mellitus with diabetic polyneuropathy: Secondary | ICD-10-CM

## 2023-11-28 DIAGNOSIS — M25562 Pain in left knee: Secondary | ICD-10-CM | POA: Diagnosis not present

## 2023-11-28 DIAGNOSIS — Z794 Long term (current) use of insulin: Secondary | ICD-10-CM | POA: Diagnosis not present

## 2023-11-28 DIAGNOSIS — E7800 Pure hypercholesterolemia, unspecified: Secondary | ICD-10-CM

## 2023-11-28 DIAGNOSIS — G8929 Other chronic pain: Secondary | ICD-10-CM | POA: Diagnosis not present

## 2023-11-28 DIAGNOSIS — M25561 Pain in right knee: Secondary | ICD-10-CM

## 2023-11-28 DIAGNOSIS — F32A Depression, unspecified: Secondary | ICD-10-CM | POA: Diagnosis not present

## 2023-11-28 LAB — MICROALBUMIN / CREATININE URINE RATIO
Creatinine,U: 100.1 mg/dL
Microalb Creat Ratio: 42.5 mg/g — ABNORMAL HIGH (ref 0.0–30.0)
Microalb, Ur: 4.2 mg/dL — ABNORMAL HIGH (ref 0.0–1.9)

## 2023-11-28 NOTE — Patient Instructions (Addendum)
 Citrucel or metamucil once per day for fiber supplement. That should help with constipation, see other info below. Miralax  can be take up to once per day. Let me know if this does not help or any worsening of symptoms.   If you had an A1c in past 2 months, that may suffice for your surgeon. Let me know if not and I am happy to place a lab only order.   No other labs needed at this time.  Will recheck labs at your next visit.  Please follow-up in 3 months, sooner if any new or worsening symptoms.  If your surgeon does need a preop evaluation we will schedule a visit sooner.  Take care.    Constipation, Adult Constipation is when a person has fewer than three bowel movements in a week, has difficulty having a bowel movement, or has stools (feces) that are dry, hard, or larger than normal. Constipation may be caused by an underlying condition. It may become worse with age if a person takes certain medicines and does not take in enough fluids. Follow these instructions at home: Eating and drinking  Eat foods that have a lot of fiber, such as beans, whole grains, and fresh fruits and vegetables. Limit foods that are low in fiber and high in fat and processed sugars, such as fried or sweet foods. These include french fries, hamburgers, cookies, candies, and soda. Drink enough fluid to keep your urine pale yellow. General instructions Exercise regularly or as told by your health care provider. Try to do 150 minutes of moderate exercise each week. Use the bathroom when you have the urge to go. Do not hold it in. Take over-the-counter and prescription medicines only as told by your health care provider. This includes any fiber supplements. During bowel movements: Practice deep breathing while relaxing the lower abdomen. Practice pelvic floor relaxation. Watch your condition for any changes. Let your health care provider know about them. Keep all follow-up visits as told by your health care provider.  This is important. Contact a health care provider if: You have pain that gets worse. You have a fever. You do not have a bowel movement after 4 days. You vomit. You are not hungry or you lose weight. You are bleeding from the opening between the buttocks (anus). You have thin, pencil-like stools. Get help right away if: You have a fever and your symptoms suddenly get worse. You leak stool or have blood in your stool. Your abdomen is bloated. You have severe pain in your abdomen. You feel dizzy or you faint. Summary Constipation is when a person has fewer than three bowel movements in a week, has difficulty having a bowel movement, or has stools (feces) that are dry, hard, or larger than normal. Eat foods that have a lot of fiber, such as beans, whole grains, and fresh fruits and vegetables. Drink enough fluid to keep your urine pale yellow. Take over-the-counter and prescription medicines only as told by your health care provider. This includes any fiber supplements. This information is not intended to replace advice given to you by your health care provider. Make sure you discuss any questions you have with your health care provider. Document Revised: 05/02/2022 Document Reviewed: 05/02/2022 Elsevier Patient Education  2024 [employer].

## 2023-11-28 NOTE — Progress Notes (Signed)
 +   Subjective:  Patient ID: Daniel [name], male    DOB: [birth year]  Age: 73 y.o. MRN: [id number]  CC:  Chief Complaint  Patient presents with   Medical Management of Chronic Issues    Pt is doing okay notes he has trouble with constipation and is asking if linzess would be an option     HPI Daniel [name] presents for   Constipation Discussed at his April visit.  Off-and-on constipation for a while.  Did have some nausea at that time.  Had been using MiraLAX  and Dulcolax chews for constipation.  Symptoms were improving at that time. Still continues with some constipation difficulty. He is on carbergoline for prolactinoma -constipation listed as possible side effect. Taking miralax  most days per week.  No current fiber supplements. No GLP/GIP for DM.  On ultram  for knee pain.  Constipation - every week or so. GI  Dr. General Kenner - appt in August.  No n/v.   He is followed by endocrinology for diabetes, insulin -dependent, with CKD, neuropathy.  hypothyroidism hypogonadism, benign neoplasm of pituitary gland -prolactinoma.  Dr. Balan.  Office visit April 24.  Also followed by urology for hypogonadotrophic hypogonadism treated with testosterone , AndroGel  and Gemtesa for nocturia Urine microalbumin ordered today. Needs updated A1C today for surgeon - last one 2 months ago.   Chronic knee pain Prior left knee replacement, initially deferred right knee surgery and managing with tramadol  1 to 2/day.  Followed by Ortho, Dr. Lucienne Ryder, planning for surgery on right knee (total knee planned) and will need updated A1c, I have not received any preop eval paperwork. Unknown timing - will need OV for preop eval.  More sore past weeks after tilling a root. Reason for ortho eval. Taking 2-3 tramadol  per day. No new side effects other than constipation.   Hyperlipidemia: Crestor  10mg  every day - no new side effects.  Lab Results  Component Value Date   CHOL 145 08/06/2023   HDL 60.40 08/06/2023    LDLCALC 69 08/06/2023   TRIG 78.0 08/06/2023   CHOLHDL 2 08/06/2023   Lab Results  Component Value Date   ALT 19 10/28/2023   AST 29 10/28/2023   ALKPHOS 65 10/28/2023   BILITOT 0.3 10/28/2023    Hypertension: Lisinopril  20mg  every day. No new side effects.  Home readings:130/70 range.  BP Readings from Last 3 Encounters:  11/28/23 136/74  10/28/23 122/78  08/02/23 138/80   Lab Results  Component Value Date   CREATININE 1.47 10/28/2023      Depression: Decreased control last year, thought to be component of his hypogonadism but also increased sertraline  to 200 mg at that time.  Continues on same dose.  Improving when discussed in December.     06/19/2023    9:55 AM 03/27/2023    8:31 AM 02/28/2023    8:12 AM 12/24/2022    8:19 AM 08/22/2022    8:22 AM  Depression screen PHQ 2/9  Decreased Interest 0 0 0 0 0  Down, Depressed, Hopeless 0 0 0 0 0  PHQ - 2 Score 0 0 0 0 0  Altered sleeping 0 0 1 1 1   Tired, decreased energy 0 1 1 3 1   Change in appetite 0 1 0 1 0  Feeling bad or failure about yourself  0 0 0 0 0  Trouble concentrating 0 0 0 0 0  Moving slowly or fidgety/restless 0 0 0 0 0  Suicidal thoughts 0 0 0 0 0  PHQ-9  Score 0 2 2 5 2   Difficult doing work/chores  Not difficult at all Not difficult at all         History Patient Active Problem List   Diagnosis Date Noted   Chronic anemia 11/27/2023   Chronic kidney disease (CKD), dialysis modality undecided 11/27/2023   Diabetic peripheral neuropathy (HCC) 11/27/2023   Hypogonadism in male 11/27/2023   Hypothyroidism, secondary 11/27/2023   Neoplasm of pituitary gland 11/27/2023   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   Uncontrolled type 2 diabetes mellitus with hyperglycemia (HCC) 11/27/2023   Essential (primary) hypertension 11/27/2023   Bilateral hearing loss 08/05/2023   Bilateral impacted cerumen 08/05/2023   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/26/2020   Trigger finger, left little finger 02/04/2020   Carpal tunnel syndrome, left upper limb 11/24/2019   Unilateral primary osteoarthritis, left knee 11/02/2019   Unilateral primary osteoarthritis, right knee 11/02/2019   Carpal tunnel syndrome, right upper limb 02/18/2018   Anxiety and depression 10/28/2016   Gastroesophageal reflux disease without esophagitis 10/28/2016   Seasonal allergic rhinitis due to pollen 10/28/2016   Primary osteoarthritis of left knee 07/25/2015   Essential hypertension 07/25/2015   Hypercholesteremia 07/25/2015   Pollen allergies 02/10/2014   Allergic rhinitis due to animal (cat) (dog) hair and dander 09/15/2012   Extrinsic asthma 09/13/2011   Proteinuria 11/24/2010   Past Medical History:  Diagnosis Date   Allergic rhinitis    Allergy    Anxiety    Arthritis    Benign hypertension    Cataract    Depression    Diabetes mellitus (HCC)    type 2    GERD (gastroesophageal reflux disease)    History of pneumonia    HLD (hyperlipidemia)    Neuromuscular disorder (HCC)    pt denies at 8/19/visit    Pneumonia    hx of several times per pt - last episode 4-5 years ago    Past Surgical History:  Procedure Laterality Date   CARPAL TUNNEL RELEASE Bilateral 2020 Right, 2021 left   CATARACT EXTRACTION     bilateral   EYE SURGERY     KNEE SURGERY     right   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   Procedure: LEFT TOTAL KNEE ARTHROPLASTY;  Surgeon: Arnie Lao, MD;  Location: [facility];  Service: Orthopedics;  Laterality: Left;   Allergies  Allergen Reactions   Codeine Palpitations, Shortness Of Breath and Other (See Comments)   Pioglitazone Other (See Comments), Shortness Of Breath and Swelling    Blood sugar went up and down and sweating profusely   Prior to Admission medications   Medication Sig Start Date End Date Taking? Authorizing Provider  albuterol  (VENTOLIN  HFA) 108 (90 Base) MCG/ACT inhaler INHALE 2  PUFFS BY MOUTH EVERY 6 HOURS AS NEEDED FOR WHEEZING OR SHORTNESS OF BREATH 08/02/23  Yes [name] Iba, PA  aspirin  EC 81 MG tablet Take by mouth. 10/17/13  Yes [provider]  cabergoline (DOSTINEX) 0.5 MG tablet Take 0.25 mg by mouth 2 (two) times a week. Take twice a week   Yes [provider]  Cholecalciferol  (VITAMIN D ) 125 MCG (5000 UT) CAPS Take 5,000 Units by mouth daily.   Yes [provider]  Continuous Glucose Sensor (FREESTYLE LIBRE 3 PLUS SENSOR) MISC CHANGE EVERY 15 DAYS   Yes [provider]  Cyanocobalamin (VITAMIN B 12 PO) Take 1,000 mg by mouth daily.    Yes [provider]  doxycycline  (VIBRA -TABS) 100 MG tablet Take 1 tablet (100 mg total) by mouth 2 (two) times daily. 08/02/23  Yes [name] Iba, PA  ferrous sulfate  325 (65 FE) MG EC tablet Take 1 tablet (325 mg total) by mouth daily with breakfast. 10/05/21  Yes Armbruster, Lendon Queen, MD  fluticasone  (FLONASE ) 50 MCG/ACT nasal spray Place 1 spray into both nostrils daily.   Yes [provider]  GEMTESA 75 MG TABS  02/11/23  Yes [provider]  glucose blood test strip Check glucose twice daily. 10/20/13  Yes [provider]  HUMALOG MIX 75/25 KWIKPEN (75-25) 100 UNIT/ML KwikPen 40 Units 2 (two) times daily before a meal. 06/18/23  Yes [provider]  Insulin  Pen Needle (PEN NEEDLES) 32G X 4 MM MISC 1 Application by Does not apply route daily. 06/20/22  Yes Benjiman Bras, MD  Lancets [facility] DELICA PLUS LANCET33G) MISC USE 1 TO CHECK GLUCOSE ONCE DAILY 08/04/18  Yes [provider]  levothyroxine (SYNTHROID) 50 MCG tablet Take 50 mcg by mouth daily before breakfast.   Yes [provider]  lisinopril  (ZESTRIL ) 20 MG tablet Take 1 tablet (20 mg total) by mouth daily. 12/24/22  Yes Benjiman Bras, MD  loratadine (CLARITIN) 10 MG tablet Take by mouth. 10/17/13  Yes [provider]  montelukast  (SINGULAIR ) 10 MG tablet  Take 1 tablet (10 mg total) by mouth at bedtime. 06/19/23  Yes Benjiman Bras, MD  Multiple Vitamin (MULTIVITAMIN WITH MINERALS) TABS tablet Take 1 tablet by mouth daily.   Yes [provider]  mupirocin  ointment (BACTROBAN ) 2 % Apply 1 Application topically 2 (two) times daily. For 1 week 10/11/22  Yes Benjiman Bras, MD  ondansetron  (ZOFRAN ) 4 MG tablet Take 1 tablet (4 mg total) by mouth every 8 (eight) hours as needed for nausea or vomiting. 09/20/22  Yes Benjiman Bras, MD  [facility] ULTRA test strip USE 1 STRIP TO CHECK GLUCOSE ONCE DAILY 11/25/18  Yes Benjiman Bras, MD  pravastatin  (PRAVACHOL ) 80 MG tablet Take 80 mg by mouth daily.   Yes [provider]  pregabalin (LYRICA) 100 MG capsule Take 100 mg by mouth 2 (two) times daily. One capsule by mouth twice a day   Yes [provider]  rosuvastatin  (CRESTOR ) 10 MG tablet Take 1 tablet (10 mg total) by mouth daily. 06/24/23  Yes Benjiman Bras, MD  sertraline  (ZOLOFT ) 100 MG tablet Take 2 tablets (200 mg total) by mouth daily. 2 tablets daily. 06/19/23  Yes Benjiman Bras, MD  Testosterone  1.62 % GEL Apply 1 Application topically daily. 11/29/22  Yes [provider]  traMADol  (ULTRAM ) 50 MG tablet TAKE 1 TABLET BY MOUTH EVERY 6 HOURS AS NEEDED 10/15/23  Yes Benjiman Bras, MD  vitamin E  180 MG (400 UNITS) capsule Take 400 Units by mouth daily.   Yes [provider]   Social History   Socioeconomic History   Marital status: Married    Spouse name: Not on file   Number of children: 3   Years of education: 16   Highest education level: Bachelor's degree (e.g., BA, AB, BS)  Occupational History   Occupation: Photographer, Optician, dispensing  Tobacco Use   Smoking status: Never   Smokeless tobacco: Never  Vaping Use   Vaping status: Never Used  Substance and Sexual Activity   Alcohol use: No   Drug use: No   Sexual activity: Not Currently  Other Topics Concern   Not  on file   Social History Narrative   Caffiene decaff tea and coffee, occ diet soda.   Social Drivers of Corporate investment banker Strain: Low Risk  (02/28/2023)   Overall Financial Resource Strain (CARDIA)    Difficulty of Paying Living Expenses: Not hard at all  Food Insecurity: No Food Insecurity (02/28/2023)   Hunger Vital Sign    Worried About Running Out of Food in the Last Year: Never true    Ran Out of Food in the Last Year: Never true  Transportation Needs: No Transportation Needs (02/28/2023)   PRAPARE - Administrator, Civil Service (Medical): No    Lack of Transportation (Non-Medical): No  Physical Activity: Insufficiently Active (02/28/2023)   Exercise Vital Sign    Days of Exercise per Week: 1 day    Minutes of Exercise per Session: 20 min  Stress: No Stress Concern Present (02/28/2023)   [employer] of Occupational Health - Occupational Stress Questionnaire    Feeling of Stress : Not at all  Social Connections: Unknown (02/28/2023)   Social Connection and Isolation Panel [NHANES]    Frequency of Communication with Friends and Family: Twice a week    Frequency of Social Gatherings with Friends and Family: Once a week    Attends Religious Services: Not on Insurance claims handler of Clubs or Organizations: Yes    Attends Banker Meetings: More than 4 times per year    Marital Status: Married  Catering manager Violence: Not At Risk (02/15/2022)   Humiliation, Afraid, Rape, and Kick questionnaire    Fear of Current or Ex-Partner: No    Emotionally Abused: No    Physically Abused: No    Sexually Abused: No    Review of Systems Per HPI.   Objective:   Vitals:   11/28/23 0811  BP: 136/74  Pulse: 70  Resp: 16  Temp: 98 F (36.7 C)  TempSrc: Temporal  SpO2: 96%  Weight: 248 lb (112.5 kg)  Height: 5\' 8"  (1.727 m)     Physical Exam Vitals reviewed.  Constitutional:      Appearance: He is well-developed.  HENT:     Head: Normocephalic and  atraumatic.  Neck:     Vascular: No carotid bruit or JVD.  Cardiovascular:     Rate and Rhythm: Normal rate and regular rhythm.     Heart sounds: Normal heart sounds. No murmur heard. Pulmonary:     Effort: Pulmonary effort is normal.     Breath sounds: Normal breath sounds. No rales.  Abdominal:     General: Abdomen is flat. Bowel sounds are normal. There is no distension.     Tenderness: There is no abdominal tenderness. There is no guarding.  Musculoskeletal:     Right lower leg: No edema.     Left lower leg: No edema.     Comments: Right knee brace in place.  Skin:    General: Skin is warm and dry.  Neurological:     Mental Status: He is alert and oriented to person, place, and time.  Psychiatric:        Mood and Affect: Mood normal.      Assessment & Plan:  Daniel [name] is a 73 y.o. male . Type 2 diabetes mellitus with diabetic polyneuropathy, without long-term current use of insulin  (HCC) - Plan: Microalbumin / creatinine urine ratio  - Check urine microalbumin creatinine ratio, continue follow-up with endocrinology.  Chronic pain of both  knees  - Status post knee replacement as above, planned knee replacement other side, has had A1c within 3 months.  Deferred at this time but can check labs closer to surgery if needed and preop eval appointment if needed  Hypercholesteremia  - Tolerating current statin, continue same.  Depression, unspecified depression type  - Stable with current regimen, continue same  Pituitary macroadenoma (HCC) Hypogonadism male  - On cabergoline as above which can contribute to constipation.  Med management per endocrinology.  Constipation, unspecified constipation type  - Could be med related with tramadol , cabergoline.   Fiber in the diet recommended as well as MiraLAX  as needed, RTC precautions.  Will hold on Linzess at this time.  Does have gastroenterologist, can look into possible sooner appointment if needed.  RTC precautions. No  orders of the defined types were placed in this encounter.  Patient Instructions  Citrucel or metamucil once per day for fiber supplement. That should help with constipation, see other info below. Miralax  can be take up to once per day. Let me know if this does not help or any worsening of symptoms.   If you had an A1c in past 2 months, that may suffice for your surgeon. Let me know if not and I am happy to place a lab only order.   No other labs needed at this time.  Will recheck labs at your next visit.  Please follow-up in 3 months, sooner if any new or worsening symptoms.  If your surgeon does need a preop evaluation we will schedule a visit sooner.  Take care.    Constipation, Adult Constipation is when a person has fewer than three bowel movements in a week, has difficulty having a bowel movement, or has stools (feces) that are dry, hard, or larger than normal. Constipation may be caused by an underlying condition. It may become worse with age if a person takes certain medicines and does not take in enough fluids. Follow these instructions at home: Eating and drinking  Eat foods that have a lot of fiber, such as beans, whole grains, and fresh fruits and vegetables. Limit foods that are low in fiber and high in fat and processed sugars, such as fried or sweet foods. These include french fries, hamburgers, cookies, candies, and soda. Drink enough fluid to keep your urine pale yellow. General instructions Exercise regularly or as told by your health care provider. Try to do 150 minutes of moderate exercise each week. Use the bathroom when you have the urge to go. Do not hold it in. Take over-the-counter and prescription medicines only as told by your health care provider. This includes any fiber supplements. During bowel movements: Practice deep breathing while relaxing the lower abdomen. Practice pelvic floor relaxation. Watch your condition for any changes. Let your health care  provider know about them. Keep all follow-up visits as told by your health care provider. This is important. Contact a health care provider if: You have pain that gets worse. You have a fever. You do not have a bowel movement after 4 days. You vomit. You are not hungry or you lose weight. You are bleeding from the opening between the buttocks (anus). You have thin, pencil-like stools. Get help right away if: You have a fever and your symptoms suddenly get worse. You leak stool or have blood in your stool. Your abdomen is bloated. You have severe pain in your abdomen. You feel dizzy or you faint. Summary Constipation is when a person has fewer  than three bowel movements in a week, has difficulty having a bowel movement, or has stools (feces) that are dry, hard, or larger than normal. Eat foods that have a lot of fiber, such as beans, whole grains, and fresh fruits and vegetables. Drink enough fluid to keep your urine pale yellow. Take over-the-counter and prescription medicines only as told by your health care provider. This includes any fiber supplements. This information is not intended to replace advice given to you by your health care provider. Make sure you discuss any questions you have with your health care provider. Document Revised: 05/02/2022 Document Reviewed: 05/02/2022 Elsevier Patient Education  2024 Elsevier Inc.       Signed,   Caro Christmas, MD [facility], [facility] 11/28/23 9:10 AM

## 2023-11-30 ENCOUNTER — Encounter: Payer: Self-pay | Admitting: Family Medicine

## 2023-12-02 ENCOUNTER — Other Ambulatory Visit: Payer: Self-pay | Admitting: Family Medicine

## 2023-12-02 DIAGNOSIS — G8929 Other chronic pain: Secondary | ICD-10-CM

## 2023-12-02 NOTE — Telephone Encounter (Signed)
 Controlled substance database reviewed.  Tramadol  No. 120 last filled on 10/15/2023.  Medication and plan labs discussed on 11/28/2023.  Refill ordered.

## 2023-12-03 ENCOUNTER — Ambulatory Visit: Payer: Self-pay | Admitting: Family Medicine

## 2024-01-14 ENCOUNTER — Other Ambulatory Visit: Payer: Self-pay | Admitting: Family Medicine

## 2024-01-14 DIAGNOSIS — I10 Essential (primary) hypertension: Secondary | ICD-10-CM

## 2024-01-14 DIAGNOSIS — G8929 Other chronic pain: Secondary | ICD-10-CM

## 2024-01-14 NOTE — Telephone Encounter (Signed)
 Medication discussed at his May 29 visit.  Controlled substance database reviewed.  Tramadol  last filled for #120 on 12/03/2023.  Refill ordered.

## 2024-01-22 ENCOUNTER — Ambulatory Visit: Admitting: Student in an Organized Health Care Education/Training Program

## 2024-01-22 ENCOUNTER — Other Ambulatory Visit: Payer: Self-pay | Admitting: Podiatry

## 2024-01-22 ENCOUNTER — Ambulatory Visit (INDEPENDENT_AMBULATORY_CARE_PROVIDER_SITE_OTHER)

## 2024-01-22 ENCOUNTER — Ambulatory Visit: Admitting: Podiatry

## 2024-01-22 ENCOUNTER — Encounter: Payer: Self-pay | Admitting: Podiatry

## 2024-01-22 ENCOUNTER — Encounter: Payer: Self-pay | Admitting: Student in an Organized Health Care Education/Training Program

## 2024-01-22 ENCOUNTER — Ambulatory Visit: Payer: Self-pay

## 2024-01-22 VITALS — BP 118/58 | HR 76 | Temp 98.2°F | Ht 68.0 in | Wt 243.2 lb

## 2024-01-22 DIAGNOSIS — L089 Local infection of the skin and subcutaneous tissue, unspecified: Secondary | ICD-10-CM

## 2024-01-22 DIAGNOSIS — E08621 Diabetes mellitus due to underlying condition with foot ulcer: Secondary | ICD-10-CM

## 2024-01-22 DIAGNOSIS — E11628 Type 2 diabetes mellitus with other skin complications: Secondary | ICD-10-CM | POA: Diagnosis not present

## 2024-01-22 DIAGNOSIS — M86172 Other acute osteomyelitis, left ankle and foot: Secondary | ICD-10-CM | POA: Diagnosis not present

## 2024-01-22 DIAGNOSIS — L97524 Non-pressure chronic ulcer of other part of left foot with necrosis of bone: Secondary | ICD-10-CM

## 2024-01-22 DIAGNOSIS — M869 Osteomyelitis, unspecified: Secondary | ICD-10-CM

## 2024-01-22 LAB — CBC WITH DIFFERENTIAL/PLATELET
Basophils Absolute: 0.1 K/uL (ref 0.0–0.1)
Basophils Relative: 0.5 % (ref 0.0–3.0)
Eosinophils Absolute: 0.4 K/uL (ref 0.0–0.7)
Eosinophils Relative: 3 % (ref 0.0–5.0)
HCT: 36.5 % — ABNORMAL LOW (ref 39.0–52.0)
Hemoglobin: 11.6 g/dL — ABNORMAL LOW (ref 13.0–17.0)
Lymphocytes Relative: 12.8 % (ref 12.0–46.0)
Lymphs Abs: 1.7 K/uL (ref 0.7–4.0)
MCHC: 32 g/dL (ref 30.0–36.0)
MCV: 89.9 fl (ref 78.0–100.0)
Monocytes Absolute: 1.1 K/uL — ABNORMAL HIGH (ref 0.1–1.0)
Monocytes Relative: 8.2 % (ref 3.0–12.0)
Neutro Abs: 9.8 K/uL — ABNORMAL HIGH (ref 1.4–7.7)
Neutrophils Relative %: 75.5 % (ref 43.0–77.0)
Platelets: 281 K/uL (ref 150.0–400.0)
RBC: 4.05 Mil/uL — ABNORMAL LOW (ref 4.22–5.81)
RDW: 14.6 % (ref 11.5–15.5)
WBC: 13 K/uL — ABNORMAL HIGH (ref 4.0–10.5)

## 2024-01-22 LAB — BASIC METABOLIC PANEL WITH GFR
BUN: 35 mg/dL — ABNORMAL HIGH (ref 6–23)
CO2: 29 meq/L (ref 19–32)
Calcium: 9.2 mg/dL (ref 8.4–10.5)
Chloride: 103 meq/L (ref 96–112)
Creatinine, Ser: 2.02 mg/dL — ABNORMAL HIGH (ref 0.40–1.50)
GFR: 31.76 mL/min — ABNORMAL LOW (ref >60.00)
Glucose, Bld: 182 mg/dL — ABNORMAL HIGH (ref 70–99)
Potassium: 5.3 meq/L — ABNORMAL HIGH (ref 3.5–5.1)
Sodium: 139 meq/L (ref 135–145)

## 2024-01-22 LAB — C-REACTIVE PROTEIN: CRP: 3.7 mg/dL (ref 0.5–20.0)

## 2024-01-22 LAB — SEDIMENTATION RATE: Sed Rate: 31 mm/h — ABNORMAL HIGH (ref 0–20)

## 2024-01-22 MED ORDER — AMOXICILLIN-POT CLAVULANATE 875-125 MG PO TABS: 1.0000 | ORAL_TABLET | Freq: Two times a day (BID) | ORAL | 0 refills | Status: DC

## 2024-01-22 NOTE — Telephone Encounter (Signed)
 Sending as an Financial planner. Patient is seeing Dr. Jerrell today.

## 2024-01-22 NOTE — Telephone Encounter (Signed)
 FYI Only or Action Required?: FYI only for provider.  Patient was last seen in primary care on 11/28/2023 by Levora Reyes SAUNDERS, MD.  Called Nurse Triage reporting Toe Pain.  Symptoms began several days ago.  Interventions attempted: OTC medications: Mucinex  and Prescription medications: mupirocin  and tramadol .  Symptoms are: gradually worsening.  Triage Disposition: See Physician Within 24 Hours  Patient/caregiver understands and will follow disposition?: Yes                             Copied from CRM #[id number]. Topic: Clinical - Red Word Triage >> Jan 22, 2024  8:30 AM Henretta I wrote: Red Word that prompted transfer to Nurse Triage: Infected toe, pain, peeling skin, patient is diabetic and also having some chest congestion Reason for Disposition  Foot or toe pain  Answer Assessment - Initial Assessment Questions 1. SYMPTOM: What's the main symptom you're concerned about? (e.g., rash, sore, callus, drainage, numbness)     Intermittent pain, peeling skin and redness 2. LOCATION: Where is the pain located? (e.g., foot/toe, top/bottom, left/right)     Third toe from big toe on left foot 3. APPEARANCE: What does the area look like? (e.g., normal, red, swollen; size)     Nail is still attached, bleeding day before yesterday, blackness showing up 4. ONSET: When did the symptoms start?     Skin peeling off- 3 days, pain- 2 weeks ago 5. PAIN: Is there any pain? If Yes, ask: How bad is it? (Scale: 0-10; none, mild, moderate, severe)     Rates pain a 4 at this time 6. CAUSE: What do you think is causing the symptoms?     Shoe rubbing against foot 7. BLOOD GLUCOSE: What is your blood glucose level?      States blood glucose has been high recently due to a pituitary tumor and thyroid  problems- patient could not give this RN an exact reading 9. OTHER SYMPTOMS: Do you have any other symptoms? (e.g., fever, weakness)     Clear  drainage/discharge, chest congestion- states this is not unusual for him, denies fever, denies NV, occasional slight dizziness, neuropathy at baseline     States he is a diabetic and has a problem with trimming his nails at baseline. States he has been wearing shoes without socks and skin has peeled off one of his toes.  Protocols used: Diabetes - Foot Problems and Questions-A-AH

## 2024-01-22 NOTE — Addendum Note (Signed)
 Addended by: WAYLAN ELIDIA PARAS on: 01/22/2024 04:23 PM   Modules accepted: Orders

## 2024-01-22 NOTE — Patient Instructions (Addendum)
  VISIT SUMMARY: Today, you were seen for a left third toe ulcer that has been problematic for about three weeks. You have a history of diabetes and neuropathy, which contribute to the development of ulcers. We discussed your current condition, including the recent improvement in your blood sugar control and the management of your neuropathy symptoms.  YOUR PLAN: -DIABETIC FOOT ULCER WITH OSTEOMYELITIS: You have a deep ulcer on your left third toe with a bone infection and poor blood supply, which increases the risk of losing the toe if not treated promptly. We are referring you to a podiatrist for urgent evaluation and management, aiming for an appointment today or tomorrow. You will also start antibiotics to treat the skin infection, and we may need to do an x-ray to see how far the bone infection has spread. Keep the ulcer covered with a loose gauze wrap.  -DIABETES MELLITUS: Your diabetes, which was previously poorly controlled, has improved with your A1c now between 7.2 and 7.5. To help keep your continuous glucose monitor (CGM) in place, try securing it with a waterproof bandage.  -DIABETIC NEUROPATHY: Your burning and tingling in your toes, caused by nerve damage from diabetes, are being managed with Lyrica, which has been effective for you.  INSTRUCTIONS: Please follow up with the podiatrist as soon as possible for your toe ulcer. Keep the ulcer covered with a loose gauze wrap and start the prescribed antibiotics. Secure your Freestyle Libre CGM with a waterproof bandage to improve adherence. If you have any new symptoms or concerns, contact our office immediately.

## 2024-01-22 NOTE — Assessment & Plan Note (Signed)
 A deep ulcer on the left third toe with possible osteomyelitis and compromised blood supply poses a high risk of toe loss without prompt intervention.  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  The ulcer looks to be both ischemic and neuropathic.SABRA Referred to podiatry for urgent evaluation and management, aiming for an appointment today or tomorrow. Prescribed Augmentin  for the skin infection component.  Will get x-ray and inflammatory markers today to eval for bony changes.  Instruct to keep the ulcer covered with a loose gauze wrap.  I informed him that we will do everything we can to save the toe but there is risk of amputation in the future.

## 2024-01-22 NOTE — Progress Notes (Signed)
 Acute Office Visit  Subjective:     Patient ID: Daniel [name], male    DOB: December 23, 1950, 73 y.o.   MRN: [id number]  Chief Complaint  Patient presents with   Toe Pain   Chest Congestion    Issue with third toe on left foot looks pink. Started discomfort about 2 weeks ago. Not wearing socks with shoes and toe rubbing on shoe, looked 3-4 days ago and noticed this. Noticed drainage. Pain is intermittent. Has taken tramadol  and ibuprofen for pain states it took about an hour and half to control then he was able to sleep.      HPI  Discussed the use of AI scribe software for clinical note transcription with the patient, who gave verbal consent to proceed.  History of Present Illness Daniel [name] is a 73 year old male with diabetes and neuropathy who presents with a left third toe ulcer.  The left third toe has been problematic for about three weeks, initially due to wearing Skechers tennis shoes without socks, leading to bleeding and oozing. The toe has been purple for about a week, and an ulcer developed on the bottom of the toe. He previously experienced a similar issue months ago, which improved with an antibiotic and antibiotic cream.  He has a history of diabetes, which has been better controlled recently. He uses a Freestyle Libre CGM to monitor his blood sugar, although he has had difficulty keeping the sensor on his arm. Recent glucose readings suggest an A1c equivalent between 7.2 and 7.5, improved from previous levels of 9 to 10. He also has a history of a pituitary gland tumor and thyroid  issues, which previously affected his blood sugar control.  He experiences neuropathy, with symptoms of burning and tingling in his toes. He was previously on gabapentin  but was switched to Lyrica, which alleviated these symptoms. He does not feel much in his toes.  He mentions a recent incident where he dropped a pipe wrench on his big toe, causing concern due to his diabetes. He is  currently not on any antibiotics for the toe ulcer, with the last course being months ago.      Objective:    BP (!) 118/58   Pulse 76   Temp 98.2 F (36.8 C) (Oral)   Ht 5' 8 (1.727 m)   Wt 243 lb 3.2 oz (110.3 kg)   SpO2 93%   BMI 36.98 kg/m   Physical Exam  Gen: Comfortable appearing man Ext: Left foot has moderate diffuse swelling.  There is a dusky purple appearance of the third toe.  He has palpable DP and PT pulses on the left.  He has a neuropathic ulcer on the bottom of the third toe with a central erosion that probes to soft tissue next to the bone.  Severe onychomycosis.             Assessment & Plan:    Problem List Items Addressed This Visit       Unprioritized   Diabetic foot infection (HCC) - Primary   A deep ulcer on the left third toe with possible osteomyelitis and compromised blood supply poses a high risk of toe loss without prompt intervention.  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  The ulcer looks to be both ischemic and neuropathic.SABRA Referred to podiatry for urgent evaluation and management, aiming for an appointment today or tomorrow. Prescribed Augmentin  for the skin infection component.  Will  get x-ray and inflammatory markers today to eval for bony changes.  Instruct to keep the ulcer covered with a loose gauze wrap.  I informed him that we will do everything we can to save the toe but there is risk of amputation in the future.       Relevant Medications   amoxicillin -clavulanate (AUGMENTIN ) 875-125 MG tablet   Other Relevant Orders   CBC with Differential/Platelet   Basic metabolic panel with GFR   Sedimentation rate   C-reactive protein   DG Foot Complete Left   Ambulatory referral to Podiatry    Meds ordered this encounter  Medications   amoxicillin -clavulanate (AUGMENTIN ) 875-125 MG tablet    Sig: Take 1 tablet by mouth 2 (two) times daily for 7 days.    Dispense:  14 tablet    Refill:   0    Cleatus Debby Specking, MD

## 2024-01-22 NOTE — Progress Notes (Signed)
  Subjective:  Patient ID: Daniel [name], male    DOB: [birth year],   MRN: [id number]  Chief Complaint  Patient presents with   Diabetes    I just found out that the middle toe has an Ulcer to the bone of it.    73 y.o. male presents for concern of ulceration to the left third toe. Relates about three weeks ago he wore sneakers without socks and since then the toe has been bleeding and draining and been purple for about a week. He was seen by his PCP this morning and advised to come and see us  for concern of osteomyelitis of the toe. He was placed on Augmentin  . Denies any other pedal complaints. Denies n/v/f/c.   Past Medical History:  Diagnosis Date   Allergic rhinitis    Allergy    Anxiety    Arthritis    Benign hypertension    Cataract    Depression    Diabetes mellitus (HCC)    type 2    GERD (gastroesophageal reflux disease)    History of pneumonia    HLD (hyperlipidemia)    Neuromuscular disorder (HCC)    pt denies at 8/19/visit    Pneumonia    hx of several times per pt - last episode 4-5 years ago     Objective:  Physical Exam: Vascular: DP/PT pulses 2/4 bilateral. CFT <3 seconds. Normal hair growth on digits. No edema.  Skin. No lacerations or abrasions bilateral feet. Distal left third digit with ulceration noted and probe to bone. There is some drainage noted. No purulence. Erythema and edema surrounding digit to MPJ.  Musculoskeletal: MMT 5/5 bilateral lower extremities in DF, PF, Inversion and Eversion. Deceased ROM in DF of ankle joint.  Neurological: Sensation intact to light touch.   Assessment:   1. Diabetic ulcer of toe of left foot associated with diabetes mellitus due to underlying condition, with necrosis of bone (HCC)      Plan:  Patient was evaluated and treated and all questions answered. Ulcer distal left third digit with necrosis of bone.  -X-rays reviewed and possible erosion noted to distal tip of third phalanx on left.  -Debridement as  below. -Dressed with betadine , DSD. -Off-loading with surgical shoe. Dispensed.  -Start Augmentin .  -Discussed glucose control and proper protein-rich diet.  -Discussed if any worsening redness, pain, fever or chills to call or may need to report to the emergency room. Patient expressed understanding.  -Discussed given depth and changes on radiographs concern for osteomyelitis or bone infection. Discussed typically the best treatment is amputation of the toe. Patient does express understanding and is ok with this. He does not have any systemic symptoms and appears stable so will attempt outpatient care. Discussed if any worsening will need to head to the ED.  MRI ordered for further eval.  ABIs to check circulation.   Procedure: Excisional Debridement of Wound Rationale: Removal of non-viable soft tissue from the wound to promote healing.  Anesthesia: none Pre-Debridement Wound Measurements: Overlying slough  Post-Debridement Wound Measurements: 0.5 cm x 0.8 cm x 0.5 cm  Type of Debridement: Sharp Excisional Tissue Removed: Non-viable soft tissue Depth of Debridement: subcutaneous tissue. Technique: Sharp excisional debridement to bleeding, viable wound base.  Dressing: Dry, sterile, compression dressing. Disposition: Patient tolerated procedure well. Patient to return in 1 week for follow-up.  Return in about 1 week (around 01/29/2024) for wound check.   Asberry Failing, DPM

## 2024-01-23 ENCOUNTER — Ambulatory Visit
Admission: RE | Admit: 2024-01-23 | Discharge: 2024-01-23 | Disposition: A | Discharge status: Home / Self Care | Source: Ambulatory Visit | Attending: Podiatry

## 2024-01-23 ENCOUNTER — Ambulatory Visit: Payer: Self-pay | Admitting: Student in an Organized Health Care Education/Training Program

## 2024-01-23 DIAGNOSIS — E875 Hyperkalemia: Secondary | ICD-10-CM | POA: Insufficient documentation

## 2024-01-23 DIAGNOSIS — L97529 Non-pressure chronic ulcer of other part of left foot with unspecified severity: Secondary | ICD-10-CM | POA: Diagnosis not present

## 2024-01-23 DIAGNOSIS — E08621 Diabetes mellitus due to underlying condition with foot ulcer: Secondary | ICD-10-CM

## 2024-01-23 DIAGNOSIS — M19072 Primary osteoarthritis, left ankle and foot: Secondary | ICD-10-CM | POA: Diagnosis not present

## 2024-01-23 MED ORDER — LOKELMA 10 G PO PACK
10.0000 g | PACK | Freq: Every day | ORAL | 0 refills | Status: DC
Start: 2024-01-23 — End: 2024-01-29

## 2024-01-23 NOTE — Progress Notes (Signed)
 Pt scheduled with Dr. Jerrell on 7/31

## 2024-01-24 ENCOUNTER — Ambulatory Visit (HOSPITAL_COMMUNITY)
Admission: RE | Admit: 2024-01-24 | Discharge: 2024-01-24 | Disposition: A | Discharge status: Home / Self Care | Source: Ambulatory Visit | Attending: Podiatry | Admitting: Podiatry

## 2024-01-24 DIAGNOSIS — L97524 Non-pressure chronic ulcer of other part of left foot with necrosis of bone: Secondary | ICD-10-CM | POA: Insufficient documentation

## 2024-01-24 DIAGNOSIS — I10 Essential (primary) hypertension: Secondary | ICD-10-CM | POA: Insufficient documentation

## 2024-01-24 DIAGNOSIS — E08621 Diabetes mellitus due to underlying condition with foot ulcer: Secondary | ICD-10-CM

## 2024-01-24 DIAGNOSIS — E785 Hyperlipidemia, unspecified: Secondary | ICD-10-CM | POA: Insufficient documentation

## 2024-01-24 DIAGNOSIS — E11621 Type 2 diabetes mellitus with foot ulcer: Secondary | ICD-10-CM | POA: Diagnosis not present

## 2024-01-24 LAB — VAS US ABI WITH/WO TBI
Left ABI: 1.06
Right ABI: 1.12

## 2024-01-25 LAB — WOUND CULTURE
MICRO NUMBER:: 16736188
SPECIMEN QUALITY:: ADEQUATE

## 2024-01-25 LAB — HOUSE ACCOUNT TRACKING

## 2024-01-27 ENCOUNTER — Other Ambulatory Visit: Payer: Self-pay | Admitting: Podiatry

## 2024-01-27 DIAGNOSIS — E08621 Diabetes mellitus due to underlying condition with foot ulcer: Secondary | ICD-10-CM

## 2024-01-29 ENCOUNTER — Encounter (HOSPITAL_COMMUNITY): Payer: Self-pay

## 2024-01-29 ENCOUNTER — Other Ambulatory Visit: Payer: Self-pay

## 2024-01-29 ENCOUNTER — Inpatient Hospital Stay (HOSPITAL_COMMUNITY)
Admission: EM | Admit: 2024-01-29 | Discharge: 2024-02-01 | DRG: 617 | Disposition: A | Discharge status: Home Health | Attending: Internal Medicine | Admitting: Internal Medicine

## 2024-01-29 ENCOUNTER — Ambulatory Visit: Admitting: Podiatry

## 2024-01-29 ENCOUNTER — Encounter: Payer: Self-pay | Admitting: Podiatry

## 2024-01-29 DIAGNOSIS — L089 Local infection of the skin and subcutaneous tissue, unspecified: Secondary | ICD-10-CM | POA: Diagnosis present

## 2024-01-29 DIAGNOSIS — L97529 Non-pressure chronic ulcer of other part of left foot with unspecified severity: Secondary | ICD-10-CM | POA: Diagnosis not present

## 2024-01-29 DIAGNOSIS — Z885 Allergy status to narcotic agent status: Secondary | ICD-10-CM

## 2024-01-29 DIAGNOSIS — Z888 Allergy status to other drugs, medicaments and biological substances status: Secondary | ICD-10-CM

## 2024-01-29 DIAGNOSIS — Z66 Do not resuscitate: Secondary | ICD-10-CM | POA: Diagnosis present

## 2024-01-29 DIAGNOSIS — E11649 Type 2 diabetes mellitus with hypoglycemia without coma: Secondary | ICD-10-CM | POA: Diagnosis present

## 2024-01-29 DIAGNOSIS — D352 Benign neoplasm of pituitary gland: Secondary | ICD-10-CM | POA: Diagnosis present

## 2024-01-29 DIAGNOSIS — E11628 Type 2 diabetes mellitus with other skin complications: Secondary | ICD-10-CM | POA: Diagnosis present

## 2024-01-29 DIAGNOSIS — Z8042 Family history of malignant neoplasm of prostate: Secondary | ICD-10-CM

## 2024-01-29 DIAGNOSIS — M7989 Other specified soft tissue disorders: Secondary | ICD-10-CM | POA: Diagnosis not present

## 2024-01-29 DIAGNOSIS — N1831 Chronic kidney disease, stage 3a: Secondary | ICD-10-CM | POA: Diagnosis not present

## 2024-01-29 DIAGNOSIS — Z96652 Presence of left artificial knee joint: Secondary | ICD-10-CM | POA: Diagnosis present

## 2024-01-29 DIAGNOSIS — M86172 Other acute osteomyelitis, left ankle and foot: Secondary | ICD-10-CM | POA: Diagnosis present

## 2024-01-29 DIAGNOSIS — E785 Hyperlipidemia, unspecified: Secondary | ICD-10-CM | POA: Diagnosis not present

## 2024-01-29 DIAGNOSIS — E1165 Type 2 diabetes mellitus with hyperglycemia: Secondary | ICD-10-CM | POA: Diagnosis present

## 2024-01-29 DIAGNOSIS — Z833 Family history of diabetes mellitus: Secondary | ICD-10-CM

## 2024-01-29 DIAGNOSIS — E119 Type 2 diabetes mellitus without complications: Secondary | ICD-10-CM

## 2024-01-29 DIAGNOSIS — F419 Anxiety disorder, unspecified: Secondary | ICD-10-CM | POA: Diagnosis not present

## 2024-01-29 DIAGNOSIS — F32A Depression, unspecified: Secondary | ICD-10-CM | POA: Diagnosis not present

## 2024-01-29 DIAGNOSIS — E1122 Type 2 diabetes mellitus with diabetic chronic kidney disease: Secondary | ICD-10-CM | POA: Diagnosis not present

## 2024-01-29 DIAGNOSIS — E1159 Type 2 diabetes mellitus with other circulatory complications: Secondary | ICD-10-CM | POA: Diagnosis present

## 2024-01-29 DIAGNOSIS — Z6836 Body mass index (BMI) 36.0-36.9, adult: Secondary | ICD-10-CM

## 2024-01-29 DIAGNOSIS — Z79890 Hormone replacement therapy (postmenopausal): Secondary | ICD-10-CM

## 2024-01-29 DIAGNOSIS — G8929 Other chronic pain: Secondary | ICD-10-CM | POA: Diagnosis not present

## 2024-01-29 DIAGNOSIS — E1169 Type 2 diabetes mellitus with other specified complication: Principal | ICD-10-CM | POA: Diagnosis present

## 2024-01-29 DIAGNOSIS — Z79899 Other long term (current) drug therapy: Secondary | ICD-10-CM

## 2024-01-29 DIAGNOSIS — E11621 Type 2 diabetes mellitus with foot ulcer: Secondary | ICD-10-CM | POA: Diagnosis not present

## 2024-01-29 DIAGNOSIS — E1142 Type 2 diabetes mellitus with diabetic polyneuropathy: Secondary | ICD-10-CM | POA: Diagnosis present

## 2024-01-29 DIAGNOSIS — Z7982 Long term (current) use of aspirin: Secondary | ICD-10-CM

## 2024-01-29 DIAGNOSIS — M869 Osteomyelitis, unspecified: Secondary | ICD-10-CM | POA: Diagnosis not present

## 2024-01-29 DIAGNOSIS — E039 Hypothyroidism, unspecified: Secondary | ICD-10-CM | POA: Diagnosis present

## 2024-01-29 DIAGNOSIS — I10 Essential (primary) hypertension: Secondary | ICD-10-CM | POA: Diagnosis not present

## 2024-01-29 DIAGNOSIS — E08621 Diabetes mellitus due to underlying condition with foot ulcer: Secondary | ICD-10-CM | POA: Diagnosis not present

## 2024-01-29 DIAGNOSIS — I129 Hypertensive chronic kidney disease with stage 1 through stage 4 chronic kidney disease, or unspecified chronic kidney disease: Secondary | ICD-10-CM | POA: Diagnosis not present

## 2024-01-29 DIAGNOSIS — E038 Other specified hypothyroidism: Secondary | ICD-10-CM | POA: Diagnosis present

## 2024-01-29 DIAGNOSIS — Z794 Long term (current) use of insulin: Secondary | ICD-10-CM

## 2024-01-29 DIAGNOSIS — Z83719 Family history of colon polyps, unspecified: Secondary | ICD-10-CM

## 2024-01-29 DIAGNOSIS — M85872 Other specified disorders of bone density and structure, left ankle and foot: Secondary | ICD-10-CM | POA: Diagnosis not present

## 2024-01-29 DIAGNOSIS — L97524 Non-pressure chronic ulcer of other part of left foot with necrosis of bone: Secondary | ICD-10-CM | POA: Diagnosis not present

## 2024-01-29 DIAGNOSIS — I152 Hypertension secondary to endocrine disorders: Secondary | ICD-10-CM | POA: Diagnosis present

## 2024-01-29 DIAGNOSIS — Z8249 Family history of ischemic heart disease and other diseases of the circulatory system: Secondary | ICD-10-CM

## 2024-01-29 LAB — CBC WITH DIFFERENTIAL/PLATELET
Abs Immature Granulocytes: 0.06 K/uL (ref 0.00–0.07)
Basophils Absolute: 0.1 K/uL (ref 0.0–0.1)
Basophils Relative: 0 %
Eosinophils Absolute: 0.5 K/uL (ref 0.0–0.5)
Eosinophils Relative: 4 %
HCT: 34.8 % — ABNORMAL LOW (ref 39.0–52.0)
Hemoglobin: 11 g/dL — ABNORMAL LOW (ref 13.0–17.0)
Immature Granulocytes: 0 %
Lymphocytes Relative: 14 %
Lymphs Abs: 1.9 K/uL (ref 0.7–4.0)
MCH: 29.3 pg (ref 26.0–34.0)
MCHC: 31.6 g/dL (ref 30.0–36.0)
MCV: 92.6 fL (ref 80.0–100.0)
Monocytes Absolute: 1.3 K/uL — ABNORMAL HIGH (ref 0.1–1.0)
Monocytes Relative: 10 %
Neutro Abs: 9.8 K/uL — ABNORMAL HIGH (ref 1.7–7.7)
Neutrophils Relative %: 72 %
Platelets: 358 K/uL (ref 150–400)
RBC: 3.76 MIL/uL — ABNORMAL LOW (ref 4.22–5.81)
RDW: 13.8 % (ref 11.5–15.5)
WBC: 13.7 K/uL — ABNORMAL HIGH (ref 4.0–10.5)
nRBC: 0 % (ref 0.0–0.2)

## 2024-01-29 LAB — COMPREHENSIVE METABOLIC PANEL WITH GFR
ALT: 12 U/L (ref 0–44)
AST: 17 U/L (ref 15–41)
Albumin: 3.4 g/dL — ABNORMAL LOW (ref 3.5–5.0)
Alkaline Phosphatase: 64 U/L (ref 38–126)
Anion gap: 12 (ref 5–15)
BUN: 20 mg/dL (ref 8–23)
CO2: 23 mmol/L (ref 22–32)
Calcium: 8.9 mg/dL (ref 8.9–10.3)
Chloride: 101 mmol/L (ref 98–111)
Creatinine, Ser: 1.53 mg/dL — ABNORMAL HIGH (ref 0.61–1.24)
GFR, Estimated: 47 mL/min — ABNORMAL LOW (ref >60)
Glucose, Bld: 164 mg/dL — ABNORMAL HIGH (ref 70–99)
Potassium: 4.3 mmol/L (ref 3.5–5.1)
Sodium: 136 mmol/L (ref 135–145)
Total Bilirubin: 0.5 mg/dL (ref 0.0–1.2)
Total Protein: 7.2 g/dL (ref 6.5–8.1)

## 2024-01-29 LAB — CBG MONITORING, ED: Glucose-Capillary: 94 mg/dL (ref 70–99)

## 2024-01-29 MED ORDER — MONTELUKAST SODIUM 10 MG PO TABS
10.0000 mg | ORAL_TABLET | Freq: Every day | ORAL | Status: DC
  Administered 2024-01-29 – 2024-01-31 (×3): 10 mg via ORAL
  Filled 2024-01-29 (×3): qty 1

## 2024-01-29 MED ORDER — TESTOSTERONE 50 MG/5GM (1%) TD GEL
2.5000 g | Freq: Every day | TRANSDERMAL | Status: DC
  Administered 2024-01-30 – 2024-02-01 (×3): 2.5 g via TRANSDERMAL
  Filled 2024-01-29 (×3): qty 5

## 2024-01-29 MED ORDER — ONDANSETRON HCL 4 MG/2ML IJ SOLN: 4.0000 mg | Freq: Four times a day (QID) | INTRAMUSCULAR | Status: DC | PRN

## 2024-01-29 MED ORDER — ONDANSETRON HCL 4 MG PO TABS: 4.0000 mg | ORAL_TABLET | Freq: Four times a day (QID) | ORAL | Status: DC | PRN

## 2024-01-29 MED ORDER — ACETAMINOPHEN 650 MG RE SUPP: 650.0000 mg | Freq: Four times a day (QID) | RECTAL | Status: DC | PRN

## 2024-01-29 MED ORDER — INSULIN ASPART 100 UNIT/ML IJ SOLN: 0.0000 [IU] | Freq: Every day | INTRAMUSCULAR | Status: DC

## 2024-01-29 MED ORDER — ALBUTEROL SULFATE (2.5 MG/3ML) 0.083% IN NEBU: 2.5000 mg | INHALATION_SOLUTION | Freq: Four times a day (QID) | RESPIRATORY_TRACT | Status: DC | PRN

## 2024-01-29 MED ORDER — SENNOSIDES-DOCUSATE SODIUM 8.6-50 MG PO TABS: 1.0000 | ORAL_TABLET | Freq: Every evening | ORAL | Status: DC | PRN

## 2024-01-29 MED ORDER — LISINOPRIL 20 MG PO TABS
20.0000 mg | ORAL_TABLET | Freq: Every day | ORAL | Status: DC
  Administered 2024-01-30 – 2024-01-31 (×2): 20 mg via ORAL
  Filled 2024-01-29 (×3): qty 1

## 2024-01-29 MED ORDER — PREGABALIN 100 MG PO CAPS
100.0000 mg | ORAL_CAPSULE | Freq: Two times a day (BID) | ORAL | Status: DC
  Administered 2024-01-29 – 2024-02-01 (×6): 100 mg via ORAL
  Filled 2024-01-29 (×6): qty 1

## 2024-01-29 MED ORDER — INSULIN ASPART 100 UNIT/ML IJ SOLN: 0.0000 [IU] | Freq: Three times a day (TID) | INTRAMUSCULAR | Status: DC

## 2024-01-29 MED ORDER — LINEZOLID 600 MG PO TABS
600.0000 mg | ORAL_TABLET | Freq: Two times a day (BID) | ORAL | Status: DC
  Administered 2024-01-30 – 2024-02-01 (×5): 600 mg via ORAL
  Filled 2024-01-29 (×5): qty 1

## 2024-01-29 MED ORDER — SERTRALINE HCL 100 MG PO TABS
200.0000 mg | ORAL_TABLET | Freq: Every day | ORAL | Status: DC
  Administered 2024-01-30 – 2024-02-01 (×3): 200 mg via ORAL
  Filled 2024-01-29 (×3): qty 2

## 2024-01-29 MED ORDER — TRAMADOL HCL 50 MG PO TABS
50.0000 mg | ORAL_TABLET | Freq: Four times a day (QID) | ORAL | Status: DC | PRN
  Administered 2024-01-31 – 2024-02-01 (×3): 50 mg via ORAL
  Filled 2024-01-29 (×3): qty 1

## 2024-01-29 MED ORDER — ROSUVASTATIN CALCIUM 5 MG PO TABS
10.0000 mg | ORAL_TABLET | Freq: Every day | ORAL | Status: DC
Start: 2024-01-30 — End: 2024-02-01
  Administered 2024-01-30 – 2024-02-01 (×3): 10 mg via ORAL
  Filled 2024-01-29 (×3): qty 2

## 2024-01-29 MED ORDER — ACETAMINOPHEN 325 MG PO TABS: 650.0000 mg | ORAL_TABLET | Freq: Four times a day (QID) | ORAL | Status: DC | PRN

## 2024-01-29 MED ORDER — LEVOTHYROXINE SODIUM 75 MCG PO TABS
75.0000 ug | ORAL_TABLET | Freq: Every day | ORAL | Status: DC
  Administered 2024-01-30 – 2024-02-01 (×3): 75 ug via ORAL
  Filled 2024-01-29 (×3): qty 1

## 2024-01-29 MED ORDER — HYDROCODONE-ACETAMINOPHEN 5-325 MG PO TABS: 1.0000 | ORAL_TABLET | ORAL | Status: DC | PRN

## 2024-01-29 MED ORDER — CABERGOLINE 0.5 MG PO TABS
0.5000 mg | ORAL_TABLET | ORAL | Status: DC
  Administered 2024-01-30: 0.5 mg via ORAL
  Filled 2024-01-29 (×3): qty 1

## 2024-01-29 MED ORDER — SODIUM CHLORIDE 0.9 % IV SOLN
3.0000 g | Freq: Four times a day (QID) | INTRAVENOUS | Status: DC
  Administered 2024-01-30 – 2024-01-31 (×5): 3 g via INTRAVENOUS
  Filled 2024-01-29 (×5): qty 8

## 2024-01-29 MED ORDER — SODIUM CHLORIDE 0.9 % IV SOLN
2.0000 g | Freq: Once | INTRAVENOUS | Status: AC
  Administered 2024-01-29: 2 g via INTRAVENOUS
  Filled 2024-01-29: qty 12.5

## 2024-01-29 MED ORDER — VANCOMYCIN HCL 2000 MG/400ML IV SOLN
2000.0000 mg | Freq: Once | INTRAVENOUS | Status: AC
  Administered 2024-01-29: 2000 mg via INTRAVENOUS
  Filled 2024-01-29: qty 400

## 2024-01-29 NOTE — Progress Notes (Signed)
 ED Pharmacy Antibiotic Sign Off An antibiotic consult was received from an ED provider for Vancomycin  and Cefepime  per pharmacy dosing for L foot osteomyelitis. A chart review was completed to assess appropriateness.   The following one time order(s) were placed:  Vancomycin  2gm IV and Cefepime  2gm IV  Further antibiotic and/or antibiotic pharmacy consults should be ordered by the admitting provider if indicated.   Thank you for allowing pharmacy to be a part of this patient's care.   Vito Ralph, PharmD, BCPS Please see amion for complete clinical pharmacist phone list 01/29/24 9:22 PM

## 2024-01-29 NOTE — ED Provider Notes (Signed)
  EMERGENCY DEPARTMENT AT [facility] Provider Note  CSN: [id number] Arrival date & time: 01/29/24 1850  Chief Complaint(s) Toe Pain  HPI Daniel [name] is a 73 y.o. male who presents emergency room for evaluation of toe pain and wound.  Symptoms have been worsening over the last few weeks and he received an outpatient MRI confirming osteomyelitis.  Has been on outpatient antibiotics and symptoms have been persisting.  Saw his podiatrist today who sent him to the ER for IV antibiotics and hospital admission.   Past Medical History Past Medical History:  Diagnosis Date   Allergic rhinitis    Allergy    Anxiety    Arthritis    Benign hypertension    Cataract    Depression    Diabetes mellitus (HCC)    type 2    GERD (gastroesophageal reflux disease)    History of pneumonia    HLD (hyperlipidemia)    Neuromuscular disorder (HCC)    pt denies at 8/19/visit    Pneumonia    hx of several times per pt - last episode 4-5 years ago    Patient Active Problem List   Diagnosis Date Noted   Hyperkalemia 01/23/2024   Diabetic foot infection (HCC) 01/22/2024   Chronic anemia 11/27/2023   Chronic kidney disease (CKD), dialysis modality undecided 11/27/2023   Diabetic peripheral neuropathy (HCC) 11/27/2023   Hypogonadism in male 11/27/2023   Hypothyroidism, secondary 11/27/2023   Neoplasm of pituitary gland 11/27/2023   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   Uncontrolled type 2 diabetes mellitus with hyperglycemia (HCC) 11/27/2023   Essential (primary) hypertension 11/27/2023   Bilateral hearing loss 08/05/2023   Bilateral impacted cerumen 08/05/2023   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   Trigger finger, left little finger 02/04/2020   Carpal tunnel syndrome, left upper limb 11/24/2019   Unilateral primary osteoarthritis, left knee 11/02/2019   Unilateral primary osteoarthritis, right  knee 11/02/2019   Carpal tunnel syndrome, right upper limb 02/18/2018   Anxiety and depression 10/28/2016   Gastroesophageal reflux disease without esophagitis 10/28/2016   Seasonal allergic rhinitis due to pollen 10/28/2016   Primary osteoarthritis of left knee 07/25/2015   Essential hypertension 07/25/2015   Hypercholesteremia 07/25/2015   Pollen allergies 02/10/2014   Allergic rhinitis due to animal (cat) (dog) hair and dander 09/15/2012   Extrinsic asthma 09/13/2011   Proteinuria 11/24/2010   Home Medication(s) Prior to Admission medications   Medication Sig Start Date End Date Taking? Authorizing Provider  albuterol  (VENTOLIN  HFA) 108 (90 Base) MCG/ACT inhaler INHALE 2 PUFFS BY MOUTH EVERY 6 HOURS AS NEEDED FOR WHEEZING OR SHORTNESS OF BREATH 08/02/23   Job Lukes, PA  amoxicillin -clavulanate (AUGMENTIN ) 875-125 MG tablet Take 1 tablet by mouth 2 (two) times daily for 7 days. 01/22/24 01/29/24  Jerrell Cleatus Ned, MD  aspirin  EC 81 MG tablet Take by mouth. 10/17/13   [provider]  cabergoline  (DOSTINEX ) 0.5 MG tablet Take 0.25 mg by mouth 2 (two) times a week. Take twice a week    [provider]  Cholecalciferol  (VITAMIN D ) 125 MCG (5000 UT) CAPS Take 5,000 Units by mouth daily.    [provider]  Continuous Glucose Sensor (FREESTYLE LIBRE 3 PLUS SENSOR) MISC CHANGE EVERY 15 DAYS    [provider]  Cyanocobalamin (VITAMIN B 12 PO) Take 1,000 mg by mouth daily.     [provider]  doxycycline  (VIBRA -TABS) 100 MG tablet Take  1 tablet (100 mg total) by mouth 2 (two) times daily. 08/02/23   Job Lukes, PA  ferrous sulfate  325 (65 FE) MG EC tablet Take 1 tablet (325 mg total) by mouth daily with breakfast. 10/05/21   Armbruster, Elspeth SQUIBB, MD  fluticasone  (FLONASE ) 50 MCG/ACT nasal spray Place 1 spray into both nostrils daily.    [provider]  GEMTESA 75 MG TABS  02/11/23   [provider]  glucose blood test  strip Check glucose twice daily. 10/20/13   [provider]  HUMALOG MIX 75/25 KWIKPEN (75-25) 100 UNIT/ML KwikPen 40 Units 2 (two) times daily before a meal. 06/18/23   [provider]  Insulin  Pen Needle (PEN NEEDLES) 32G X 4 MM MISC 1 Application by Does not apply route daily. 06/20/22   Levora Reyes SAUNDERS, MD  Lancets [facility] DELICA PLUS VARA) MISC USE 1 TO CHECK GLUCOSE ONCE DAILY 08/04/18   [provider]  levothyroxine  (SYNTHROID ) 50 MCG tablet Take 50 mcg by mouth daily before breakfast.    [provider]  lisinopril  (ZESTRIL ) 20 MG tablet Take 1 tablet by mouth once daily 01/14/24   Greene, Jeffrey R, MD  loratadine (CLARITIN) 10 MG tablet Take by mouth. 10/17/13   [provider]  montelukast  (SINGULAIR ) 10 MG tablet Take 1 tablet (10 mg total) by mouth at bedtime. 06/19/23   Levora Reyes SAUNDERS, MD  Multiple Vitamin (MULTIVITAMIN WITH MINERALS) TABS tablet Take 1 tablet by mouth daily.    [provider]  mupirocin  ointment (BACTROBAN ) 2 % Apply 1 Application topically 2 (two) times daily. For 1 week 10/11/22   Levora Reyes SAUNDERS, MD  ondansetron  (ZOFRAN ) 4 MG tablet Take 1 tablet (4 mg total) by mouth every 8 (eight) hours as needed for nausea or vomiting. 09/20/22   Levora Reyes SAUNDERS, MD  [facility] ULTRA test strip USE 1 STRIP TO CHECK GLUCOSE ONCE DAILY 11/25/18   Levora Reyes SAUNDERS, MD  pravastatin  (PRAVACHOL ) 80 MG tablet Take 80 mg by mouth daily.    [provider]  pregabalin  (LYRICA ) 100 MG capsule Take 100 mg by mouth 2 (two) times daily. One capsule by mouth twice a day    [provider]  rosuvastatin  (CRESTOR ) 10 MG tablet Take 1 tablet (10 mg total) by mouth daily. 06/24/23   Levora Reyes SAUNDERS, MD  sertraline  (ZOLOFT ) 100 MG tablet Take 2 tablets (200 mg total) by mouth daily. 2 tablets daily. 06/19/23   Levora Reyes SAUNDERS, MD  sodium zirconium cyclosilicate  (LOKELMA ) 10 g PACK packet Take 10 g by mouth daily.  01/23/24   Jerrell Cleatus Ned, MD  Testosterone  1.62 % GEL Apply 1 Application topically daily. 11/29/22   [provider]  traMADol  (ULTRAM ) 50 MG tablet TAKE 1 TABLET BY MOUTH EVERY 6 HOURS AS NEEDED 01/14/24   Levora Reyes SAUNDERS, MD  vitamin E  180 MG (400 UNITS) capsule Take 400 Units by mouth daily.    [provider]  Past Surgical History Past Surgical History:  Procedure Laterality Date   CARPAL TUNNEL RELEASE Bilateral 2020 Right, 2021 left   CATARACT EXTRACTION     bilateral   EYE SURGERY     KNEE SURGERY     right   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   Procedure: LEFT TOTAL KNEE ARTHROPLASTY;  Surgeon: Vernetta Lonni GRADE, MD;  Location: [facility] ORS;  Service: Orthopedics;  Laterality: Left;   Family History Family History  Problem Relation Age of Onset   Diabetes Father    Colon polyps Father    Atrial fibrillation Mother    Prostate cancer Paternal Uncle    Cancer Sister    Esophageal cancer Neg Hx    Rectal cancer Neg Hx    Stomach cancer Neg Hx     Social History Social History   Tobacco Use   Smoking status: Never   Smokeless tobacco: Never  Vaping Use   Vaping status: Never Used  Substance Use Topics   Alcohol use: No   Drug use: No   Allergies Codeine and Pioglitazone  Review of Systems Review of Systems  Skin:  Positive for wound.    Physical Exam Vital Signs  I have reviewed the triage vital signs BP (!) 148/79 (BP Location: Right Arm)   Pulse 83   Temp 98.3 F (36.8 C) (Oral)   Resp 17   Wt 109.8 kg   SpO2 94%   BMI 36.80 kg/m   Physical Exam Constitutional:      General: He is not in acute distress.    Appearance: Normal appearance.  HENT:     Head: Normocephalic and atraumatic.     Nose: No congestion or rhinorrhea.  Eyes:     General:        Right eye: No discharge.         Left eye: No discharge.     Extraocular Movements: Extraocular movements intact.     Pupils: Pupils are equal, round, and reactive to light.  Cardiovascular:     Rate and Rhythm: Normal rate and regular rhythm.     Heart sounds: No murmur heard. Pulmonary:     Effort: No respiratory distress.     Breath sounds: No wheezing or rales.  Abdominal:     General: There is no distension.     Tenderness: There is no abdominal tenderness.  Musculoskeletal:        General: Normal range of motion.     Cervical back: Normal range of motion.  Skin:    General: Skin is warm and dry.     Findings: Erythema and lesion present.  Neurological:     General: No focal deficit present.     Mental Status: He is alert.     ED Results and Treatments Labs (all labs ordered are listed, but only abnormal results are displayed) Labs Reviewed  CBC WITH DIFFERENTIAL/PLATELET - Abnormal; Notable for the following components:      Result Value   WBC 13.7 (*)    RBC 3.76 (*)    Hemoglobin 11.0 (*)    HCT 34.8 (*)    Neutro Abs 9.8 (*)    Monocytes Absolute 1.3 (*)    All other components within normal limits  COMPREHENSIVE METABOLIC PANEL WITH GFR - Abnormal; Notable for the following components:   Glucose, Bld 164 (*)    Creatinine, Ser 1.53 (*)    Albumin 3.4 (*)    GFR, Estimated 47 (*)  All other components within normal limits  CULTURE, BLOOD (ROUTINE X 2)  CULTURE, BLOOD (ROUTINE X 2)                                                                                                                          Radiology No results found.  Pertinent labs & imaging results that were available during my care of the patient were reviewed by me and considered in my medical decision making (see MDM for details).  Medications Ordered in ED Medications - No data to display                                                                                                                                    Procedures Procedures  (including critical care time)  Medical Decision Making / ED Course   This patient presents to the ED for concern of toe pain, this involves an extensive number of treatment options, and is a complaint that carries with it a high risk of complications and morbidity.  The differential diagnosis includes osteomyelitis, fracture, cellulitis, gout  MDM: Seen emergency room for evaluation of toe pain.  Physical exam with an ulcerative wound at the distal aspect of the third digit on the left consistent with osteomyelitis.  There is also erythema tracking into the dorsal aspect of the foot.  Laboratory evaluation with leukocytosis to 13.7, hemoglobin 11.0, creatinine 1.53 but is otherwise unremarkable.  Blood cultures obtained and patient started on Vanco cefepime .  Will require hospital admission for osteomyelitis.  Patient admitted   Additional history obtained:  -External records from outside source obtained and reviewed including: Chart review including previous notes, labs, imaging, consultation notes   Lab Tests: -I ordered, reviewed, and interpreted labs.   The pertinent results include:   Labs Reviewed  CBC WITH DIFFERENTIAL/PLATELET - Abnormal; Notable for the following components:      Result Value   WBC 13.7 (*)    RBC 3.76 (*)    Hemoglobin 11.0 (*)    HCT 34.8 (*)    Neutro Abs 9.8 (*)    Monocytes Absolute 1.3 (*)    All other components within normal limits  COMPREHENSIVE METABOLIC PANEL WITH GFR - Abnormal; Notable for the following components:   Glucose, Bld 164 (*)    Creatinine, Ser 1.53 (*)    Albumin 3.4 (*)    GFR, Estimated 47 (*)  All other components within normal limits  CULTURE, BLOOD (ROUTINE X 2)  CULTURE, BLOOD (ROUTINE X 2)       Medicines ordered and prescription drug management: No orders of the defined types were placed in this encounter.   -I have reviewed the patients home medicines and have made adjustments as  needed  Critical interventions none  Social Determinants of Health:  Factors impacting patients care include: none   Reevaluation: After the interventions noted above, I reevaluated the patient and found that they have :stayed the same  Co morbidities that complicate the patient evaluation  Past Medical History:  Diagnosis Date   Allergic rhinitis    Allergy    Anxiety    Arthritis    Benign hypertension    Cataract    Depression    Diabetes mellitus (HCC)    type 2    GERD (gastroesophageal reflux disease)    History of pneumonia    HLD (hyperlipidemia)    Neuromuscular disorder (HCC)    pt denies at 8/19/visit    Pneumonia    hx of several times per pt - last episode 4-5 years ago       Dispostion: I considered admission for this patient, and patient require hospital admission for osteomyelitis.     Final Clinical Impression(s) / ED Diagnoses Final diagnoses:  Osteomyelitis of foot, unspecified laterality, unspecified type [facility])     @PCDICTATION @    Albertina Dixon, MD 01/29/24 2100

## 2024-01-29 NOTE — Hospital Course (Signed)
 Daniel [name] is a 73 y.o. male with medical history significant for T2DM with peripheral neuropathy, HTN, HLD, CKD stage IIIa, prolactinoma, hypothyroidism, depression/anxiety, chronic pain who is admitted for management of osteomyelitis and diabetic ulcer of the distal left third toe.

## 2024-01-29 NOTE — ED Provider Triage Note (Signed)
 Emergency Medicine Provider Triage Evaluation Note  Daniel [name] , a 73 y.o. male  was evaluated in triage.  Pt complains of infection to the left third toe.  States he was sent here at the recommendation of his podiatrist for IV antibiotics due to osteomyelitis.  Stated they may need to take off his toe.  He denies any fever at home.  Review of Systems  Positive: As above Negative: As above  Physical Exam  BP (!) 148/79 (BP Location: Right Arm)   Pulse 83   Temp 98.3 F (36.8 C) (Oral)   Resp 17   Wt 109.8 kg   SpO2 94%   BMI 36.80 kg/m  Gen:   Awake, no distress   Resp:  Normal effort  MSK:   Moves extremities without difficulty  Other:  Left third toe with erythema and edema.  Edema does extend up onto the dorsum of the foot.  Medical Decision Making  Medically screening exam initiated at 7:16 PM.  Appropriate orders placed.  [name] Limes was informed that the remainder of the evaluation will be completed by another provider, this initial triage assessment does not replace that evaluation, and the importance of remaining in the ED until their evaluation is complete.     Veta Palma, PA-C 01/29/24 1916

## 2024-01-29 NOTE — ED Triage Notes (Signed)
 POV/ ambulatory/ left middle toe infection/ sent by podiatrist for poss amputation/ minimal drainage/ A&OX4

## 2024-01-29 NOTE — H&P (Signed)
 History and Physical    Daniel [name] FMW:[id number] DOB: March 09, 1951 DOA: 01/29/2024  PCP: Levora Reyes SAUNDERS, MD  Patient coming from: Home  I have personally briefly reviewed patient's old medical records in [facility]  Chief Complaint: Left third toe infected ulcer  HPI: Daniel [name] is a 73 y.o. male with medical history significant for T2DM with peripheral neuropathy, HTN, HLD, CKD stage IIIa, prolactinoma, hypothyroidism, depression/anxiety, chronic pain who presented to the ED from podiatry office for evaluation of osteomyelitis and diabetic ulcer of left third toe.  Patient states he first developed an infection involving the tip of his left third toe about 3 months ago after he accidentally cut his nails too short.  He was treated with a course of antibiotics with improvement.  He says that he has chronic neuropathy with loss of sensation in the tips of all of his toes.  He has been wearing shoes without socks for the last couple months.  He has been following with podiatry and saw his PCP in office last week.  He was noted to have a deep ulcer involving the tip of his left third toe.  He was started on a course of Augmentin  which he has been taking.  He had an MRI of his left foot 7/24 which showed ulceration of the distal third digit with osteomyelitis of the underlying third distal phalanx.  No abscess.  He saw podiatry, Dr. Joya, in office earlier today.  Due to progression of his ulcer he was advised to come to the ED to be admitted for antibiotics and likely amputation.  Patient otherwise denies fevers, chills, diaphoresis, chest pain, dyspnea.  ED Course  Labs/Imaging on admission: I have personally reviewed following labs and imaging studies.  Initial vitals showed BP 148/79, pulse 83, RR 17, temp 98.3 F, SpO2 94% on room air.  Labs show WBC 13.7, hemoglobin 11.0, platelets 358, sodium 136, potassium 4.3, bicarb 23, BUN 20, creatinine 1.53, serum glucose 164,  LFTs within normal limits.  Blood cultures in process.  Patient was given IV vancomycin  and cefepime .  The hospitalist service was consulted to admit.  Review of Systems: All systems reviewed and are negative except as documented in history of present illness above.   Past Medical History:  Diagnosis Date   Allergic rhinitis    Allergy    Anxiety    Arthritis    Benign hypertension    Cataract    Depression    Diabetes mellitus (HCC)    type 2    GERD (gastroesophageal reflux disease)    History of pneumonia    HLD (hyperlipidemia)    Neuromuscular disorder (HCC)    pt denies at 8/19/visit    Pneumonia    hx of several times per pt - last episode 4-5 years ago     Past Surgical History:  Procedure Laterality Date   CARPAL TUNNEL RELEASE Bilateral 2020 Right, 2021 left   CATARACT EXTRACTION     bilateral   EYE SURGERY     KNEE SURGERY     right   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   Procedure: LEFT TOTAL KNEE ARTHROPLASTY;  Surgeon: Vernetta Lonni GRADE, MD;  Location: [facility];  Service: Orthopedics;  Laterality: Left;    Social History: Patient denies tobacco use.  Allergies  Allergen Reactions   Codeine Palpitations, Shortness Of Breath and Other (See Comments)   Pioglitazone Other (See Comments), Shortness Of Breath and Swelling    Blood sugar  went up and down and sweating profusely    Family History  Problem Relation Age of Onset   Diabetes Father    Colon polyps Father    Atrial fibrillation Mother    Prostate cancer Paternal Uncle    Cancer Sister    Esophageal cancer Neg Hx    Rectal cancer Neg Hx    Stomach cancer Neg Hx      Prior to Admission medications   Medication Sig Start Date End Date Taking? Authorizing Provider  albuterol  (VENTOLIN  HFA) 108 (90 Base) MCG/ACT inhaler INHALE 2 PUFFS BY MOUTH EVERY 6 HOURS AS NEEDED FOR WHEEZING OR SHORTNESS OF BREATH 08/02/23   Job Lukes, PA  amoxicillin -clavulanate (AUGMENTIN ) 875-125 MG  tablet Take 1 tablet by mouth 2 (two) times daily for 7 days. 01/22/24 01/29/24  Jerrell Cleatus Ned, MD  aspirin  EC 81 MG tablet Take by mouth. 10/17/13   [provider]  cabergoline  (DOSTINEX ) 0.5 MG tablet Take 0.25 mg by mouth 2 (two) times a week. Take twice a week    [provider]  Cholecalciferol  (VITAMIN D ) 125 MCG (5000 UT) CAPS Take 5,000 Units by mouth daily.    [provider]  Continuous Glucose Sensor (FREESTYLE LIBRE 3 PLUS SENSOR) MISC CHANGE EVERY 15 DAYS    [provider]  Cyanocobalamin (VITAMIN B 12 PO) Take 1,000 mg by mouth daily.     [provider]  doxycycline  (VIBRA -TABS) 100 MG tablet Take 1 tablet (100 mg total) by mouth 2 (two) times daily. 08/02/23   Job Lukes, PA  ferrous sulfate  325 (65 FE) MG EC tablet Take 1 tablet (325 mg total) by mouth daily with breakfast. 10/05/21   Armbruster, Elspeth SQUIBB, MD  fluticasone  (FLONASE ) 50 MCG/ACT nasal spray Place 1 spray into both nostrils daily.    [provider]  GEMTESA 75 MG TABS  02/11/23   [provider]  glucose blood test strip Check glucose twice daily. 10/20/13   [provider]  HUMALOG MIX 75/25 KWIKPEN (75-25) 100 UNIT/ML KwikPen 40 Units 2 (two) times daily before a meal. 06/18/23   [provider]  Insulin  Pen Needle (PEN NEEDLES) 32G X 4 MM MISC 1 Application by Does not apply route daily. 06/20/22   Levora Reyes SAUNDERS, MD  Lancets [facility] DELICA PLUS VARA) MISC USE 1 TO CHECK GLUCOSE ONCE DAILY 08/04/18   [provider]  levothyroxine  (SYNTHROID ) 50 MCG tablet Take 50 mcg by mouth daily before breakfast.    [provider]  lisinopril  (ZESTRIL ) 20 MG tablet Take 1 tablet by mouth once daily 01/14/24   Greene, Jeffrey R, MD  loratadine (CLARITIN) 10 MG tablet Take by mouth. 10/17/13   [provider]  montelukast  (SINGULAIR ) 10 MG tablet Take 1 tablet (10 mg total) by mouth at bedtime. 06/19/23    Levora Reyes SAUNDERS, MD  Multiple Vitamin (MULTIVITAMIN WITH MINERALS) TABS tablet Take 1 tablet by mouth daily.    [provider]  mupirocin  ointment (BACTROBAN ) 2 % Apply 1 Application topically 2 (two) times daily. For 1 week 10/11/22   Levora Reyes SAUNDERS, MD  ondansetron  (ZOFRAN ) 4 MG tablet Take 1 tablet (4 mg total) by mouth every 8 (eight) hours as needed for nausea or vomiting. 09/20/22   Levora Reyes SAUNDERS, MD  [facility] ULTRA test strip USE 1 STRIP TO CHECK GLUCOSE ONCE DAILY 11/25/18   Levora Reyes SAUNDERS, MD  pravastatin  (PRAVACHOL ) 80 MG tablet Take 80 mg by mouth daily.  [provider]  pregabalin  (LYRICA ) 100 MG capsule Take 100 mg by mouth 2 (two) times daily. One capsule by mouth twice a day    [provider]  rosuvastatin  (CRESTOR ) 10 MG tablet Take 1 tablet (10 mg total) by mouth daily. 06/24/23   Levora Reyes SAUNDERS, MD  sertraline  (ZOLOFT ) 100 MG tablet Take 2 tablets (200 mg total) by mouth daily. 2 tablets daily. 06/19/23   Levora Reyes SAUNDERS, MD  sodium zirconium cyclosilicate  (LOKELMA ) 10 g PACK packet Take 10 g by mouth daily. 01/23/24   Jerrell Cleatus Ned, MD  Testosterone  1.62 % GEL Apply 1 Application topically daily. 11/29/22   [provider]  traMADol  (ULTRAM ) 50 MG tablet TAKE 1 TABLET BY MOUTH EVERY 6 HOURS AS NEEDED 01/14/24   Levora Reyes SAUNDERS, MD  vitamin E  180 MG (400 UNITS) capsule Take 400 Units by mouth daily.    [provider]    Physical Exam: Vitals:   01/29/24 1901 01/29/24 1903 01/29/24 2239  BP: (!) 148/79  126/65  Pulse: 83  68  Resp: 17  16  Temp: 98.3 F (36.8 C)  98.3 F (36.8 C)  TempSrc: Oral  Oral  SpO2: 94%  100%  Weight:  109.8 kg    \Constitutional: NAD, calm, comfortable Eyes: EOMI, lids and conjunctivae normal ENMT: Mucous membranes are moist. Posterior pharynx clear of any exudate or lesions.Normal dentition.  Neck: normal, supple, no masses. Respiratory: clear to auscultation  bilaterally, no wheezing, no crackles. Normal respiratory effort. No accessory muscle use.  Cardiovascular: Regular rate and rhythm, no murmurs / rubs / gallops.  Nonpitting edema left foot. 2+ pedal pulses. Abdomen: no tenderness, no masses palpated. Musculoskeletal: no clubbing / cyanosis.  Ulcerated wound to tip of left third toe Skin: Ulcerated wound tip of left third toe respiratory below Neurologic: Sensation diminished all toes. Strength 5/5 in all 4.  Psychiatric: Normal judgment and insight. Alert and oriented x 3. Normal mood.       EKG: Not performed.  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 male with medical history significant for T2DM with peripheral neuropathy, HTN, HLD, CKD stage IIIa, prolactinoma, hypothyroidism, depression/anxiety, chronic pain who is admitted for management of osteomyelitis and diabetic ulcer of the distal left third toe.  Assessment and Plan: Osteomyelitis and diabetic ulcer of distal left third toe: Failed outpatient antibiotics.  Sent to ED for admission by podiatry Dr. Joya for IV antibiotics and likely amputation.  Leukocytosis noted otherwise hemodynamically stable. - Continue IV Unasyn  and p.o. linezolid  - Obtain ABI - Podiatry consult in a.m.  Type 2 diabetes: Uses Humalog 75/25 sliding scale at home.  Will place on moderate SSI with hs coverage.  Hypertension: Continue lisinopril .  CKD stage IIIa: Renal function stable.  Prolactinoma: Continue cabergoline .  Hypothyroidism: Continue Synthroid .  Hyperlipidemia: Continue  rosuvastatin .  Depression/anxiety: Continue sertraline .  Chronic pain and neuropathy: Continue Lyrica  and tramadol  50 mg every 6 hours as needed.   DVT prophylaxis: SCDs Start: 01/29/24 2227 Code Status:   Code Status: Do not attempt resuscitation (DNR) PRE-ARREST INTERVENTIONS DESIRED confirmed with patient on  admission. Family Communication: Discussed with patient, he has discussed with family Disposition Plan: From home and likely discharge to home pending clinical progress Consults called: Podiatry consult pending Severity of Illness: The appropriate patient status for this patient is INPATIENT. Inpatient status is judged to be reasonable and necessary in order to provide the required intensity of service to ensure the patient's safety. The patient's presenting symptoms, physical exam findings, and initial radiographic and laboratory data in the context of their chronic comorbidities is felt to place them at high risk for further clinical deterioration. Furthermore, it is not anticipated that the patient will be medically stable for discharge from the hospital within 2 midnights of admission.   * I certify that at the point of admission it is my clinical judgment that the patient will require inpatient hospital care spanning beyond 2 midnights from the point of admission due to high intensity of service, high risk for further deterioration and high frequency of surveillance required.Daniel Jorie Blanch MD [facility]  If 7PM-7AM, please contact night-coverage [email or web address]  01/29/2024, 10:47 PM

## 2024-01-29 NOTE — Progress Notes (Signed)
  Subjective:  Patient ID: Daniel [name], male    DOB: 1951/05/22,   MRN: [id number]  No chief complaint on file.   73 y.o. male presents for follow-up of ulceration and osteomyelitis of left third digit. Relates he has had more pain and more redness in the toe. Has been taking augmentin  but relates he may have missed a dose or two. SABRA He has had MRI and ABIs. . . Denies any other pedal complaints. Denies n/v/f/c.   Past Medical History:  Diagnosis Date   Allergic rhinitis    Allergy    Anxiety    Arthritis    Benign hypertension    Cataract    Depression    Diabetes mellitus (HCC)    type 2    GERD (gastroesophageal reflux disease)    History of pneumonia    HLD (hyperlipidemia)    Neuromuscular disorder (HCC)    pt denies at 8/19/visit    Pneumonia    hx of several times per pt - last episode 4-5 years ago     Objective:  Physical Exam: Vascular: DP/PT pulses 2/4 bilateral. CFT <3 seconds. Normal hair growth on digits. No edema.  Skin. No lacerations or abrasions bilateral feet. Distal left third digit with ulceration noted and probe to bone. There is some drainage noted. No purulence. Erythema and edema surrounding digit to MPJ.  Musculoskeletal: MMT 5/5 bilateral lower extremities in DF, PF, Inversion and Eversion. Deceased ROM in DF of ankle joint.  Neurological: Sensation intact to light touch.   MRI left foot  IMPRESSION: 1. Ulceration of the distal third digit with osteomyelitis of the underlying third distal phalanx. No abscess. 2. Mild-to-moderate arthritic changes of the first through third TMT joints with patchy periarticular edema, as can be seen in the setting of neuropathic arthropathy. 3. Mild fatty atrophy of the intrinsic foot musculature with increased T2 hyperintense signal, which could reflect chronic denervation changes, however, myositis cannot be entirely excluded.    Summary:  Right: Resting right ankle-brachial index is within normal range.  The  right toe-brachial index is abnormal.   Left: Resting left ankle-brachial index is within normal range. The left  toe-brachial index is abnormal.    *See table(s) above for measurements and observations.        Assessment:   1. Diabetic ulcer of toe of left foot associated with diabetes mellitus due to underlying condition, with necrosis of bone (HCC)       Plan:  Patient was evaluated and treated and all questions answered. Ulcer distal left third digit with necrosis of bone.  -X-rays reviewed and possible erosion noted to distal tip of third phalanx on left.  -Debridement as below. -Dressed with betadine , DSD. -Off-loading with surgical shoe.  -Discussed glucose control and proper protein-rich diet. .  -Worsening of the toe infection after being on antibiotics. Do feel at this point patient needs to head to the emergency room for more urgent treatmtent of toe infection. Patient will need vascular follow-up IV antibiotics and ultimately toe amputation. He expressed udnerstadning.  Will notify Dr. Malvin on call.   No follow-ups on file.   Asberry Failing, DPM

## 2024-01-30 ENCOUNTER — Inpatient Hospital Stay (HOSPITAL_COMMUNITY): Admitting: Certified Registered Nurse Anesthetist

## 2024-01-30 ENCOUNTER — Ambulatory Visit: Admitting: Student in an Organized Health Care Education/Training Program

## 2024-01-30 ENCOUNTER — Encounter (HOSPITAL_COMMUNITY)
Admission: EM | Disposition: A | Discharge status: Home Health | Payer: Self-pay | Source: Home / Self Care | Attending: Internal Medicine

## 2024-01-30 ENCOUNTER — Encounter (HOSPITAL_COMMUNITY): Payer: Self-pay | Admitting: Internal Medicine

## 2024-01-30 ENCOUNTER — Inpatient Hospital Stay (HOSPITAL_COMMUNITY)

## 2024-01-30 ENCOUNTER — Encounter (HOSPITAL_COMMUNITY)

## 2024-01-30 DIAGNOSIS — M869 Osteomyelitis, unspecified: Secondary | ICD-10-CM

## 2024-01-30 DIAGNOSIS — F419 Anxiety disorder, unspecified: Secondary | ICD-10-CM | POA: Diagnosis not present

## 2024-01-30 DIAGNOSIS — I129 Hypertensive chronic kidney disease with stage 1 through stage 4 chronic kidney disease, or unspecified chronic kidney disease: Secondary | ICD-10-CM

## 2024-01-30 DIAGNOSIS — N1831 Chronic kidney disease, stage 3a: Secondary | ICD-10-CM | POA: Diagnosis not present

## 2024-01-30 DIAGNOSIS — M86172 Other acute osteomyelitis, left ankle and foot: Secondary | ICD-10-CM

## 2024-01-30 DIAGNOSIS — E1169 Type 2 diabetes mellitus with other specified complication: Secondary | ICD-10-CM

## 2024-01-30 DIAGNOSIS — F32A Depression, unspecified: Secondary | ICD-10-CM

## 2024-01-30 HISTORY — PX: AMPUTATION TOE: SHX6595

## 2024-01-30 LAB — GLUCOSE, CAPILLARY
Glucose-Capillary: 144 mg/dL — ABNORMAL HIGH (ref 70–99)
Glucose-Capillary: 123 mg/dL — ABNORMAL HIGH (ref 70–99)
Glucose-Capillary: 133 mg/dL — ABNORMAL HIGH (ref 70–99)
Glucose-Capillary: 52 mg/dL — ABNORMAL LOW (ref 70–99)
Glucose-Capillary: 81 mg/dL (ref 70–99)
Glucose-Capillary: 81 mg/dL (ref 70–99)
Glucose-Capillary: 65 mg/dL — ABNORMAL LOW (ref 70–99)
Glucose-Capillary: 221 mg/dL — ABNORMAL HIGH (ref 70–99)

## 2024-01-30 LAB — CBC
HCT: 33.6 % — ABNORMAL LOW (ref 39.0–52.0)
Hemoglobin: 10.6 g/dL — ABNORMAL LOW (ref 13.0–17.0)
MCH: 29.6 pg (ref 26.0–34.0)
MCHC: 31.5 g/dL (ref 30.0–36.0)
MCV: 93.9 fL (ref 80.0–100.0)
Platelets: 350 K/uL (ref 150–400)
RBC: 3.58 MIL/uL — ABNORMAL LOW (ref 4.22–5.81)
RDW: 14 % (ref 11.5–15.5)
WBC: 13 K/uL — ABNORMAL HIGH (ref 4.0–10.5)
nRBC: 0 % (ref 0.0–0.2)

## 2024-01-30 LAB — BASIC METABOLIC PANEL WITH GFR
Anion gap: 13 (ref 5–15)
BUN: 18 mg/dL (ref 8–23)
CO2: 22 mmol/L (ref 22–32)
Calcium: 8.4 mg/dL — ABNORMAL LOW (ref 8.9–10.3)
Chloride: 103 mmol/L (ref 98–111)
Creatinine, Ser: 1.56 mg/dL — ABNORMAL HIGH (ref 0.61–1.24)
GFR, Estimated: 46 mL/min — ABNORMAL LOW (ref >60)
Glucose, Bld: 137 mg/dL — ABNORMAL HIGH (ref 70–99)
Potassium: 4.6 mmol/L (ref 3.5–5.1)
Sodium: 138 mmol/L (ref 135–145)

## 2024-01-30 LAB — PREALBUMIN: Prealbumin: 14 mg/dL — ABNORMAL LOW (ref 18–38)

## 2024-01-30 LAB — HEMOGLOBIN A1C
Hgb A1c MFr Bld: 8.9 % — ABNORMAL HIGH (ref 4.8–5.6)
Mean Plasma Glucose: 208.73 mg/dL

## 2024-01-30 SURGERY — AMPUTATION, TOE
Anesthesia: Monitor Anesthesia Care | Site: Toe | Laterality: Left

## 2024-01-30 MED ORDER — DEXTROSE 50 % IV SOLN
25.0000 g | INTRAVENOUS | Status: AC
  Administered 2024-01-30: 25 g via INTRAVENOUS

## 2024-01-30 MED ORDER — PHENYLEPHRINE 80 MCG/ML (10ML) SYRINGE FOR IV PUSH (FOR BLOOD PRESSURE SUPPORT)
PREFILLED_SYRINGE | INTRAVENOUS | Status: AC
  Filled 2024-01-30: qty 20

## 2024-01-30 MED ORDER — ONDANSETRON HCL 4 MG/2ML IJ SOLN: 4.0000 mg | Freq: Once | INTRAMUSCULAR | Status: DC | PRN

## 2024-01-30 MED ORDER — PROPOFOL 10 MG/ML IV BOLUS
INTRAVENOUS | Status: DC | PRN
  Administered 2024-01-30: 20 mg via INTRAVENOUS

## 2024-01-30 MED ORDER — LIDOCAINE HCL 1 % IJ SOLN
INTRAMUSCULAR | Status: AC
  Filled 2024-01-30: qty 20

## 2024-01-30 MED ORDER — MIRABEGRON ER 25 MG PO TB24
25.0000 mg | ORAL_TABLET | Freq: Every day | ORAL | Status: DC
  Administered 2024-01-30 – 2024-02-01 (×3): 25 mg via ORAL
  Filled 2024-01-30 (×3): qty 1

## 2024-01-30 MED ORDER — PHENYLEPHRINE 80 MCG/ML (10ML) SYRINGE FOR IV PUSH (FOR BLOOD PRESSURE SUPPORT)
PREFILLED_SYRINGE | INTRAVENOUS | Status: DC | PRN
  Administered 2024-01-30: 160 ug via INTRAVENOUS

## 2024-01-30 MED ORDER — ONDANSETRON HCL 4 MG/2ML IJ SOLN
INTRAMUSCULAR | Status: AC
Start: 2024-01-30 — End: 2024-01-30
  Filled 2024-01-30: qty 2

## 2024-01-30 MED ORDER — ASPIRIN 81 MG PO TBEC
81.0000 mg | DELAYED_RELEASE_TABLET | Freq: Every day | ORAL | Status: DC
  Administered 2024-01-30 – 2024-01-31 (×2): 81 mg via ORAL
  Filled 2024-01-30 (×2): qty 1

## 2024-01-30 MED ORDER — LIDOCAINE 2% (20 MG/ML) 5 ML SYRINGE
INTRAMUSCULAR | Status: AC
  Filled 2024-01-30: qty 10

## 2024-01-30 MED ORDER — CHLORHEXIDINE GLUCONATE 0.12 % MT SOLN
15.0000 mL | Freq: Once | OROMUCOSAL | Status: AC
  Administered 2024-01-30: 15 mL via OROMUCOSAL
  Filled 2024-01-30: qty 15

## 2024-01-30 MED ORDER — DEXTROSE 50 % IV SOLN
INTRAVENOUS | Status: AC
  Filled 2024-01-30: qty 50

## 2024-01-30 MED ORDER — PHENYLEPHRINE 80 MCG/ML (10ML) SYRINGE FOR IV PUSH (FOR BLOOD PRESSURE SUPPORT)
PREFILLED_SYRINGE | INTRAVENOUS | Status: AC
  Filled 2024-01-30: qty 10

## 2024-01-30 MED ORDER — ORAL CARE MOUTH RINSE: 15.0000 mL | Freq: Once | OROMUCOSAL | Status: AC

## 2024-01-30 MED ORDER — BUPIVACAINE HCL (PF) 0.5 % IJ SOLN
INTRAMUSCULAR | Status: DC | PRN
  Administered 2024-01-30: 7 mL

## 2024-01-30 MED ORDER — PROPOFOL 500 MG/50ML IV EMUL
INTRAVENOUS | Status: DC | PRN
  Administered 2024-01-30: 75 ug/kg/min via INTRAVENOUS

## 2024-01-30 MED ORDER — ACETAMINOPHEN 10 MG/ML IV SOLN: 1000.0000 mg | Freq: Once | INTRAVENOUS | Status: DC | PRN

## 2024-01-30 MED ORDER — LIDOCAINE HCL 1 % IJ SOLN
INTRAMUSCULAR | Status: DC | PRN
Start: 2024-01-30 — End: 2024-01-30
  Administered 2024-01-30: 8 mL

## 2024-01-30 MED ORDER — OXYCODONE HCL 5 MG/5ML PO SOLN: 5.0000 mg | Freq: Once | ORAL | Status: DC | PRN

## 2024-01-30 MED ORDER — INSULIN ASPART 100 UNIT/ML IJ SOLN
0.0000 [IU] | INTRAMUSCULAR | Status: DC
  Administered 2024-01-30: 2 [IU] via SUBCUTANEOUS
  Administered 2024-01-30: 5 [IU] via SUBCUTANEOUS
  Administered 2024-01-30: 2 [IU] via SUBCUTANEOUS
  Administered 2024-01-31 (×2): 3 [IU] via SUBCUTANEOUS
  Administered 2024-01-31: 2 [IU] via SUBCUTANEOUS
  Administered 2024-01-31: 3 [IU] via SUBCUTANEOUS
  Administered 2024-02-01 (×3): 2 [IU] via SUBCUTANEOUS

## 2024-01-30 MED ORDER — LACTATED RINGERS IV SOLN: INTRAVENOUS | Status: DC

## 2024-01-30 MED ORDER — INSULIN GLARGINE-YFGN 100 UNIT/ML ~~LOC~~ SOLN
10.0000 [IU] | SUBCUTANEOUS | Status: DC
  Administered 2024-01-30 – 2024-02-01 (×3): 10 [IU] via SUBCUTANEOUS
  Filled 2024-01-30 (×3): qty 0.1

## 2024-01-30 MED ORDER — 0.9 % SODIUM CHLORIDE (POUR BTL) OPTIME
TOPICAL | Status: DC | PRN
Start: 2024-01-30 — End: 2024-01-30
  Administered 2024-01-30: 1000 mL

## 2024-01-30 MED ORDER — BUPIVACAINE HCL (PF) 0.5 % IJ SOLN
INTRAMUSCULAR | Status: AC
Start: 2024-01-30 — End: 2024-01-30
  Filled 2024-01-30: qty 30

## 2024-01-30 MED ORDER — OXYCODONE HCL 5 MG PO TABS: 5.0000 mg | ORAL_TABLET | Freq: Once | ORAL | Status: DC | PRN

## 2024-01-30 MED ORDER — FENTANYL CITRATE (PF) 100 MCG/2ML IJ SOLN: 25.0000 ug | INTRAMUSCULAR | Status: DC | PRN

## 2024-01-30 MED ORDER — DEXMEDETOMIDINE HCL IN NACL 80 MCG/20ML IV SOLN
INTRAVENOUS | Status: DC | PRN
Start: 2024-01-30 — End: 2024-01-30
  Administered 2024-01-30 (×2): 8 ug via INTRAVENOUS

## 2024-01-30 SURGICAL SUPPLY — 35 items
BAG COUNTER SPONGE SURGICOUNT (BAG) ×1 IMPLANT
BLADE SURG 15 STRL LF DISP TIS (BLADE) IMPLANT
BNDG ELASTIC 4INX 5YD STR LF (GAUZE/BANDAGES/DRESSINGS) IMPLANT
BNDG ELASTIC 4X5.8 VLCR STR LF (GAUZE/BANDAGES/DRESSINGS) IMPLANT
BNDG GAUZE DERMACEA FLUFF 4 (GAUZE/BANDAGES/DRESSINGS) ×1 IMPLANT
CHLORAPREP W/TINT 26 (MISCELLANEOUS) IMPLANT
COVER SURGICAL LIGHT HANDLE (MISCELLANEOUS) ×1 IMPLANT
CUFF TOURN SGL QUICK 18X4 (TOURNIQUET CUFF) ×1 IMPLANT
CUFF TRNQT CYL 24X4X16.5-23 (TOURNIQUET CUFF) IMPLANT
DRSG XEROFORM 1X8 (GAUZE/BANDAGES/DRESSINGS) IMPLANT
ELECTRODE REM PT RTRN 9FT ADLT (ELECTROSURGICAL) IMPLANT
GAUZE PAD ABD 8X10 STRL (GAUZE/BANDAGES/DRESSINGS) ×1 IMPLANT
GAUZE SPONGE 4X4 12PLY STRL (GAUZE/BANDAGES/DRESSINGS) ×1 IMPLANT
GAUZE XEROFORM 1X8 LF (GAUZE/BANDAGES/DRESSINGS) ×1 IMPLANT
GLOVE BIO SURGEON STRL SZ8 (GLOVE) ×1 IMPLANT
GLOVE BIOGEL PI IND STRL 8 (GLOVE) ×1 IMPLANT
GOWN STRL REUS W/ TWL LRG LVL3 (GOWN DISPOSABLE) ×2 IMPLANT
KIT BASIN OR (CUSTOM PROCEDURE TRAY) ×1 IMPLANT
KIT TURNOVER KIT B (KITS) ×1 IMPLANT
NDL PRECISIONGLIDE 27X1.5 (NEEDLE) ×1 IMPLANT
NEEDLE PRECISIONGLIDE 27X1.5 (NEEDLE) ×1 IMPLANT
NS IRRIG 1000ML POUR BTL (IV SOLUTION) ×1 IMPLANT
PACK ORTHO EXTREMITY (CUSTOM PROCEDURE TRAY) ×1 IMPLANT
PAD ARMBOARD POSITIONER FOAM (MISCELLANEOUS) ×2 IMPLANT
PAD CAST 4YDX4 CTTN HI CHSV (CAST SUPPLIES) ×1 IMPLANT
SOL PREP POV-IOD 4OZ 10% (MISCELLANEOUS) ×1 IMPLANT
STAPLER SKIN PROX 35W (STAPLE) IMPLANT
SUT PROLENE 4 0 PS 2 18 (SUTURE) IMPLANT
SUT VIC AB 3-0 PS2 18XBRD (SUTURE) IMPLANT
SUT VIC AB 4-0 PS2 18 (SUTURE) IMPLANT
SYR CONTROL 10ML LL (SYRINGE) ×1 IMPLANT
TOWEL GREEN STERILE (TOWEL DISPOSABLE) ×1 IMPLANT
TOWEL GREEN STERILE FF (TOWEL DISPOSABLE) ×1 IMPLANT
TUBE CONNECTING 12X1/4 (SUCTIONS) ×1 IMPLANT
YANKAUER SUCT BULB TIP NO VENT (SUCTIONS) IMPLANT

## 2024-01-30 NOTE — Consult Note (Signed)
 PODIATRY CONSULTATION  NAME Daniel [name] MRN [id number] DOB [birth year] DOA 01/29/2024   Reason for consult: Osteomyelitis left third toe Chief Complaint  Patient presents with   Toe Pain    History of present illness: 73 y.o. male PMHx T2DM with peripheral polyneuropathy, HTN, CKD stage IIIa, admitted for worsening infection left third toe.  Failed outpatient antibiotics.  MRI 01/23/2024 consistent with osteomyelitis.  Past Medical History:  Diagnosis Date   Allergic rhinitis    Allergy    Anxiety    Arthritis    Benign hypertension    Cataract    Depression    Diabetes mellitus (HCC)    type 2    GERD (gastroesophageal reflux disease)    History of pneumonia    HLD (hyperlipidemia)    Neuromuscular disorder (HCC)    pt denies at 8/19/visit    Pneumonia    hx of several times per pt - last episode 4-5 years ago        Latest Ref Rng & Units 01/30/2024    6:31 AM 01/29/2024    7:34 PM 01/22/2024   10:53 AM  CBC  WBC 4.0 - 10.5 K/uL 13.0  13.7  13.0   Hemoglobin 13.0 - 17.0 g/dL 89.3  88.9  88.3   Hematocrit 39.0 - 52.0 % 33.6  34.8  36.5   Platelets 150 - 400 K/uL 350  358  281.0        Latest Ref Rng & Units 01/30/2024    6:31 AM 01/29/2024    7:34 PM 01/22/2024   10:53 AM  BMP  Glucose 70 - 99 mg/dL 862  835  817   BUN 8 - 23 mg/dL 18  20  35   Creatinine 0.61 - 1.24 mg/dL 8.43  8.46  7.97   Sodium 135 - 145 mmol/L 138  136  139   Potassium 3.5 - 5.1 mmol/L 4.6  4.3  5.3 No hemolysis seen   Chloride 98 - 111 mmol/L 103  101  103   CO2 22 - 32 mmol/L 22  23  29    Calcium  8.9 - 10.3 mg/dL 8.4  8.9  9.2       Physical Exam: General: The patient is alert and oriented x3 in no acute distress.   Dermatology: Ulcer noted to the distal tip of the left third toe with surrounding erythema and edema.  Wound probes to bone  Vascular: VAS US  ABI WITH/WO TBI 01/24/2024 ABI Findings:  +---------+------------------+-----+---------+--------+  Right   Rt Pressure  (mmHg)IndexWaveform Comment   +---------+------------------+-----+---------+--------+  Brachial 136                    triphasic          +---------+------------------+-----+---------+--------+  PTA     148               1.09 triphasic          +---------+------------------+-----+---------+--------+  DP      152               1.12 triphasic          +---------+------------------+-----+---------+--------+  Great Toe63                0.46                    +---------+------------------+-----+---------+--------+   +---------+------------------+-----+-----------+-------+  Left    Lt Pressure (mmHg)IndexWaveform   Comment  +---------+------------------+-----+-----------+-------+  Brachial 136  triphasic           +---------+------------------+-----+-----------+-------+  PTA     144               1.06 triphasic           +---------+------------------+-----+-----------+-------+  DP      130               0.96 multiphasic         +---------+------------------+-----+-----------+-------+  Great Toe87                0.64                     +---------+------------------+-----+-----------+-------+   +-------+-----------+-----------+------------+------------+  ABI/TBIToday's ABIToday's TBIPrevious ABIPrevious TBI  +-------+-----------+-----------+------------+------------+  Right 1.12       0.46                                 +-------+-----------+-----------+------------+------------+  Left  1.06       0.64                                 +-------+-----------+-----------+------------+------------+   Summary:  Right: Resting right ankle-brachial index is within normal range. The  right toe-brachial index is abnormal.   Left: Resting left ankle-brachial index is within normal range. The left  toe-brachial index is abnormal.    Neurological: Light touch and protective threshold diminished  bilateral  Musculoskeletal Exam: No structural deformity noted.  No prior amputations.  Patient ambulatory  MR FOOT LEFT WO CONTRAST 01/23/2024 IMPRESSION: 1. Ulceration of the distal third digit with osteomyelitis of the underlying third distal phalanx. No abscess. 2. Mild-to-moderate arthritic changes of the first through third TMT joints with patchy periarticular edema, as can be seen in the setting of neuropathic arthropathy. 3. Mild fatty atrophy of the intrinsic foot musculature with increased T2 hyperintense signal, which could reflect chronic denervation changes, however, myositis cannot be entirely excluded.  ASSESSMENT/PLAN OF CARE Osteomyelitis with surrounding cellulitis left third toe  -Patient seen in the preoperative area prior to surgery -Reviewed and discussed findings of the MRI consistent with osteomyelitis and recommendation for toe amputation.  Patient is in agreement and amenable to the amputation. -Preoperative orders placed -Plan for amputation of the toes this afternoon, 01/30/2024 -Will follow     Thank you for the consult.  Please contact me directly via secure chat with any questions or concerns.     Thresa EMERSON Sar, DPM [facility]  Dr. Thresa EMERSON Sar, DPM    [address] [place], [place] [address]                Office [phone]  Fax [phone]

## 2024-01-30 NOTE — Plan of Care (Signed)

## 2024-01-30 NOTE — Op Note (Signed)
   OPERATIVE REPORT Patient name: Daniel [name] MRN: [id number] DOB: 12-12-1950  DOS: 01/30/24  Preop Dx: Osteomyelitis left third toe Postop Dx: same  Procedure:  1.  Amputation left third toe  Surgeon: Thresa EMERSON Sar DPM  Anesthesia: 8 mL of 0.5% Marcaine  plain in a digital block fashion in combination with monitored anesthesia care  Hemostasis: Calf tourniquet inflated to a pressure of without esmarch exsanguination   EBL: Minimal mL Materials: None Injectables: None Pathology: None  Condition: The patient tolerated the procedure and anesthesia well. No complications noted or reported   Justification for procedure: The patient is a 73 y.o. male who presents today for surgical correction of osteomyelitis to the left third toe. All conservative modalities of been unsuccessful in providing any sort of satisfactory alleviation of symptoms with the patient. The patient was told benefits as well as possible side effects of the surgery. The patient consented for surgical correction. The patient consent form was reviewed. All patient questions were answered. No guarantees were expressed or implied. The patient and the surgeon both signed the patient consent form with the witness present and placed in the patient's chart.   Procedure in Detail: The patient was brought to the operating room, placed in the operating table in the supine position at which time an aseptic scrub and drape were performed about the patient's respective lower extremity after anesthesia was induced as described above. Attention was then directed to the surgical area where procedure number one commenced.  Procedure #1: Amputation left third toe A racquet type incision was planned and made around the third MTP of the left foot.  Incision was carried down to the level of joint capsule where capsulotomy was performed essentially disarticulating the toe at the level of the MTP.  The toe was disarticulated and  removed in toto.  Any nonviable tissue was sharply resected away using a #15 scalpel.  Irrigation utilized in preparation for primary closure which was achieved using 4-0 Prolene suture.  Dry sterile compressive dressings were then applied to all previously mentioned incision sites about the patient's lower extremity. The tourniquet which was used for hemostasis was deflated. All normal neurovascular responses including pink color and warmth returned all the digits of patient's lower extremity.  The patient was then transferred from the operating room to the recovery room having tolerated the procedure and anesthesia well. All vital signs are stable. After a brief stay in the recovery room the patient was readmitted to inpatient room with postoperative orders placed.    IMPRESSION: Anticipate clean margins  Thresa EMERSON Sar, DPM [facility]  Dr. Thresa EMERSON Sar, DPM    [address] [place], [place] [address]                Office [phone]  Fax [phone]

## 2024-01-30 NOTE — Progress Notes (Signed)
 [facility] PROGRESS NOTE    Progress Note  Daniel [name]  FMW:[id number] DOB: 03-16-1951 DOA: 01/29/2024 PCP: Daniel Reyes SAUNDERS, MD     Brief Narrative:   Daniel [name] is an 73 y.o. male past medical history of diabetes mellitus type 2, peripheral neuropathy, essential hypertension, chronic kidney disease stage IIIa, history of prolactinoma anxiety and depression comes in to the podiatrist office first on mellitus and diabetic foot ulcer on the third toe and necrosis, MRI of the foot on 01/23/2024 show osteomyelitis of the third distal phalanges and moderate arthritic changes 1st through 3rd TMT joint and edema.  Assessment/Plan:   Osteomyelitis of third toe of left foot/  Diabetic foot infection [facility]): Failed outpatient antibiotics. Podiatrist recommended amputation. He is hemodynamically stable. Will start on IV Unasyn  and p.o. linezolid . ABIs are pending. Awaiting podiatry further recommendations.  Uncontrolled diabetes mellitus type 2/ Diabetic peripheral neuropathy [facility]): Start him on long-acting insulin  plus sliding scale, with CBGs every 4. He is currently NPO.  Essential hypertension: Blood pressure slightly elevated, continue lisinopril  we will get it this morning.  Chronic kidney disease stage IIIa: His creatinine appears to be at baseline around 1.5.  History of prolactinoma: Continue Cabergoline .  Hypothyroidism: Continue Synthroid .  Hyperlipidemia: Continue statins.  Depression/anxiety: Continue sertraline .   DVT prophylaxis: lovenox Family Communication:none Status is: Inpatient Remains inpatient appropriate because: Acute osteomyelitis    Code Status:     Code Status Orders  (From admission, onward)           Start     Ordered   01/29/24 2227  Do not attempt resuscitation (DNR) Pre-Arrest Interventions Desired  Continuous       Question Answer Comment  If pulseless and not breathing No CPR or chest compressions.   In  Pre-Arrest Conditions (Patient Has Pulse and Is Breathing) May intubate, use advanced airway interventions and cardioversion/ACLS medications if appropriate or indicated. May transfer to ICU.   Consent: Discussion documented in EHR or advanced directives reviewed      01/29/24 2227           Code Status History     Date Active Date Inactive Code Status Order ID Comments User Context   02/26/2020 1539 02/27/2020 1653 Full Code [id number]  Vernetta Lonni GRADE, MD Inpatient         IV Access:   Peripheral IV   Procedures and diagnostic studies:   No results found.   Medical Consultants:   None.   Subjective:    Daniel [name] denies any pain last bowel movement yesterday.  Objective:    Vitals:   01/29/24 2239 01/29/24 2300 01/30/24 0029 01/30/24 0510  BP: 126/65 128/66 125/64 (!) 148/70  Pulse: 68 64 77 64  Resp: 16  18 16   Temp: 98.3 F (36.8 C)   (!) 97.5 F (36.4 C)  TempSrc: Oral     SpO2: 100% 100% 99% 100%  Weight:       SpO2: 100 %   Intake/Output Summary (Last 24 hours) at 01/30/2024 9386 Last data filed at 01/29/2024 2325 Gross per 24 hour  Intake 83.33 ml  Output 175 ml  Net -91.67 ml   Filed Weights   01/29/24 1903  Weight: 109.8 kg    Exam: General exam: In no acute distress. Respiratory system: Good air movement and clear to auscultation. Cardiovascular system: S1 & S2 heard, RRR. No JVD. Gastrointestinal system: Abdomen is nondistended, soft and nontender.  Extremities: No pedal edema. Skin:  Psychiatry: Judgement and insight appear normal. Mood & affect appropriate.    Data Reviewed:    Labs: Basic Metabolic Panel: Recent Labs  Lab 01/29/24 1934  NA 136  K 4.3  CL 101  CO2 23  GLUCOSE 164*  BUN 20  CREATININE 1.53*  CALCIUM  8.9   GFR Estimated Creatinine Clearance: 50.2 mL/min (A) (by C-G formula based on SCr of 1.53 mg/dL (H)). Liver Function Tests: Recent Labs  Lab 01/29/24 1934  AST 17  ALT 12   ALKPHOS 64  BILITOT 0.5  PROT 7.2  ALBUMIN 3.4*   No results for input(s): LIPASE, AMYLASE in the last 168 hours. No results for input(s): AMMONIA in the last 168 hours. Coagulation profile No results for input(s): INR, PROTIME in the last 168 hours. COVID-19 Labs  No results for input(s): DDIMER, FERRITIN, LDH, CRP in the last 72 hours.  Lab Results  Component Value Date   SARSCOV2NAA Not Detected 03/22/2020   SARSCOV2NAA NEGATIVE 02/23/2020    CBC: Recent Labs  Lab 01/29/24 1934  WBC 13.7*  NEUTROABS 9.8*  HGB 11.0*  HCT 34.8*  MCV 92.6  PLT 358   Cardiac Enzymes: No results for input(s): CKTOTAL, CKMB, CKMBINDEX, TROPONINI in the last 168 hours. BNP (last 3 results) No results for input(s): PROBNP in the last 8760 hours. CBG: Recent Labs  Lab 01/29/24 2244  GLUCAP 94   D-Dimer: No results for input(s): DDIMER in the last 72 hours. Hgb A1c: No results for input(s): HGBA1C in the last 72 hours. Lipid Profile: No results for input(s): CHOL, HDL, LDLCALC, TRIG, CHOLHDL, LDLDIRECT in the last 72 hours. Thyroid  function studies: No results for input(s): TSH, T4TOTAL, T3FREE, THYROIDAB in the last 72 hours.  Invalid input(s): FREET3 Anemia work up: No results for input(s): VITAMINB12, FOLATE, FERRITIN, TIBC, IRON, RETICCTPCT in the last 72 hours. Sepsis Labs: Recent Labs  Lab 01/29/24 1934  WBC 13.7*   Microbiology Recent Results (from the past 240 hours)  WOUND CULTURE     Status: None   Collection Time: 01/22/24  2:40 PM  Result Value Ref Range Status   MICRO NUMBER: [id number]  Final   SPECIMEN QUALITY: Adequate  Final   SOURCE: WOUND  Final   STATUS: FINAL  Final   GRAM STAIN:   Final    No epithelial cells seen No white blood cells seen Moderate Gram negative bacilli Few Gram positive cocci in pairs   RESULT:   Final    A mix of organisms of questionable significance was recovered  on culture and not further identified. (Note: Growth did not detect the presence of S.aureus, beta-hemolytic Streptococci or P.aeruginosa).     Medications:    cabergoline   0.5 mg Oral Once per day on Monday Thursday   insulin  aspart  0-15 Units Subcutaneous TID WC   insulin  aspart  0-5 Units Subcutaneous QHS   levothyroxine   75 mcg Oral Q0600   linezolid   600 mg Oral Q12H   lisinopril   20 mg Oral Daily   montelukast   10 mg Oral QHS   pregabalin   100 mg Oral BID   rosuvastatin   10 mg Oral Daily   sertraline   200 mg Oral Daily   testosterone   2.5 g Transdermal Daily   Continuous Infusions:  ampicillin -sulbactam (UNASYN ) IV 3 g (01/30/24 0542)      LOS: 1 day   Erle Odell Castor  [facility]  01/30/2024, 6:13 AM

## 2024-01-30 NOTE — Anesthesia Preprocedure Evaluation (Addendum)
 Anesthesia Evaluation  Patient identified by MRN, date of birth, ID band Patient awake    Reviewed: Allergy & Precautions, NPO status , Patient's Chart, lab work & pertinent test results, reviewed documented beta blocker date and time   History of Anesthesia Complications Negative for: history of anesthetic complications  Airway Mallampati: II  TM Distance: >3 FB     Dental no notable dental hx.    Pulmonary neg shortness of breath, asthma , pneumonia   breath sounds clear to auscultation       Cardiovascular hypertension, (-) Past MI, (-) Cardiac Stents and (-) CABG (-) dysrhythmias (-) pacemaker(-) Cardiac Defibrillator  Rhythm:Regular Rate:Normal     Neuro/Psych neg Seizures PSYCHIATRIC DISORDERS Anxiety Depression     Neuromuscular disease    GI/Hepatic ,GERD  ,,  Endo/Other  diabetes, Poorly Controlled, Type 2Hypothyroidism    Renal/GU CRFRenal disease     Musculoskeletal  (+) Arthritis ,    Abdominal   Peds  Hematology  (+) Blood dyscrasia, anemia   Anesthesia Other Findings   Reproductive/Obstetrics                              Anesthesia Physical Anesthesia Plan  ASA: 3  Anesthesia Plan: MAC   Post-op Pain Management:    Induction: Intravenous  PONV Risk Score and Plan: 2 and Ondansetron   Airway Management Planned: Natural Airway and Simple Face Mask  Additional Equipment:   Intra-op Plan:   Post-operative Plan:   Informed Consent: I have reviewed the patients History and Physical, chart, labs and discussed the procedure including the risks, benefits and alternatives for the proposed anesthesia with the patient or authorized representative who has indicated his/her understanding and acceptance.     Dental advisory given  Plan Discussed with: CRNA  Anesthesia Plan Comments:          Anesthesia Quick Evaluation

## 2024-01-30 NOTE — Transfer of Care (Signed)
 Immediate Anesthesia Transfer of Care Note  Patient: DAYMOND CORDTS  Procedure(s) Performed: AMPUTATION, TOE (Left: Toe)  Patient Location: PACU  Anesthesia Type:MAC  Level of Consciousness: drowsy and patient cooperative  Airway & Oxygen Therapy: Patient Spontanous Breathing  Post-op Assessment: Report given to RN, Post -op Vital signs reviewed and stable, and Patient moving all extremities X 4  Post vital signs: Reviewed and stable  Last Vitals:  Vitals Value Taken Time  BP 107/56 01/30/24 16:53  Temp    Pulse 57 01/30/24 16:56  Resp 18 01/30/24 16:56  SpO2 96 % 01/30/24 16:56  Vitals shown include unfiled device data.  Last Pain:  Vitals:   01/30/24 1449  TempSrc: Oral  PainSc: 0-No pain         Complications: No notable events documented.

## 2024-01-30 NOTE — Inpatient Diabetes Management (Signed)
 Inpatient Diabetes Program Recommendations  AACE/ADA: New Consensus Statement on Inpatient Glycemic Control (2015)  Target Ranges:  Prepandial:   less than 140 mg/dL      Peak postprandial:   less than 180 mg/dL (1-2 hours)      Critically ill patients:  140 - 180 mg/dL   Lab Results  Component Value Date   GLUCAP 133 (H) 01/30/2024   HGBA1C 8.9 (H) 01/30/2024    Review of Glycemic Control  Latest Reference Range & Units 01/30/24 14:51 01/30/24 15:22  Glucose-Capillary 70 - 99 mg/dL 52 (L) 866 (H)  (L): Data is abnormally low (H): Data is abnormally high Diabetes history: Type 2 DM Outpatient Diabetes medications: Humalog 75/25 50-80 BID Current orders for Inpatient glycemic control: Novolog  0-15 units Q4H, Semglee  10 units QD  Inpatient Diabetes Program Recommendations:   Noted hypoglycemia of 52 mg/dL. Patient NPO for surgery. Consider reducing correction to Novolog  0-6 units Q4H.  Thanks, Tinnie Minus, MSN, RNC-OB Diabetes Coordinator [phone] (8a-5p)

## 2024-01-30 NOTE — Brief Op Note (Signed)
 01/30/2024  4:57 PM  PATIENT:  Daniel [name]  73 y.o. male  PRE-OPERATIVE DIAGNOSIS:  osteomyelitis left third toe  POST-OPERATIVE DIAGNOSIS:  osteomyelitis left third toe  PROCEDURE:  Procedure(s) with comments: AMPUTATION, TOE (Left) - 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  SURGEON:  Surgeons and Role:    DEWAINE Janit Thresa CHRISTELLA, DPM - Primary  PHYSICIAN ASSISTANT: none  ASSISTANTS: none   ANESTHESIA:   MAC  EBL:  5 mL   BLOOD ADMINISTERED:none  DRAINS: none   LOCAL MEDICATIONS USED:  MARCAINE     and Amount: 8 ml  SPECIMEN:  No Specimen  DISPOSITION OF SPECIMEN:  N/A  COUNTS:  YES  TOURNIQUET:   Total Tourniquet Time Documented: Calf (Left) - 28 minutes Total: Calf (Left) - 28 minutes   DICTATION: .Nechama Dictation  PLAN OF CARE: Admit to inpatient   PATIENT DISPOSITION:  PACU - hemodynamically stable.   Delay start of Pharmacological VTE agent (>24hrs) due to surgical blood loss or risk of bleeding: no

## 2024-01-30 NOTE — Consult Note (Signed)
   Spoke with patient via telephone this morning to discuss amputation of the left third toe.  Patient aware and wants to have surgery as soon as possible.  Explained that I am currently offsite but will be available to officially consult this afternoon.  He is amenable to surgery this afternoon pending or availability.  Preoperative orders placed.  NPO.  Surgery will consist of amputation left third toe. Will plan to see the patient in preop for official consult prior to surgery.  Thresa EMERSON Sar, DPM [facility]  Dr. Thresa EMERSON Sar, DPM    [address] [place], [place] [address]                Office [phone]  Fax [phone]

## 2024-01-30 NOTE — Progress Notes (Signed)
 Initial Nutrition Assessment  DOCUMENTATION CODES:   Obesity unspecified  INTERVENTION:  When diet advanced after surgery 7/31, recommend: Offering Juven BID: 1 packet Juven BID, each packet provides 95 calories, 2.5 grams of protein (collagen), and 9.8 grams of carbohydrate (3 grams sugar); also contains 7 grams of L-arginine and L-glutamine, 300 mg vitamin C, 15 mg vitamin E , 1.2 mcg vitamin B-12, 9.5 mg zinc, 200 mg calcium , and 1.5 g  Calcium  Beta-hydroxy-Beta-methylbutyrate to support wound healing  Carb Modified Diet to optimize intake and provide adequate calories and protein needed for post op healing while controlling blood sugar  If PO intake is poor, recommend ONS to boost protein intake (Ensure, Glucerna, magic cups)  Pt would also likely benefit from continuing at home regimen of multivitamin supplementation daily to support wound healing  NUTRITION DIAGNOSIS:   Increased nutrient needs related to post-op healing, wound healing as evidenced by estimated needs.  GOAL:   Patient will meet greater than or equal to 90% of their needs  MONITOR:   Supplement acceptance, PO intake, Diet advancement  REASON FOR ASSESSMENT:   Consult Wound healing  ASSESSMENT:   Pt with hx of type 2 diabetes w/ peripheral neuropathy and diabetic foot ulcer, HTN, CKD IIIa, GERD, HLD, and anxiety/depression. 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 Admitted from podiatrist office for necrosis and osteomyelitis of the L 3rd toe.  Podiatry recommends amputation with surgery scheduled for tonight 7/31. Pt has been NPO since 11pm last night, but agreeable to be NPO all day leading up to surgery.   Spoke with pt who was resting in bed at time of assessment. Pt just finished conversation with surgeon about toe amputation scheduled for tonight. Pt reports good appetite leading up to admission. Pt would eat 3x per day and drink water throughout the day. Pt reports he eats eggs, bacon/sausage, toast w/  peanut butter and jam, and occasionally cereal. For lunch, pt eats a sandwich with some type of meat and lettuce/tomato or a salad with dressing and chicken. For dinner, pt will eat a salad or meat and vegetable. Pt reports any time he eats bread he eats whole grains and not white bread. On occasion, pt will eat out. Pt has been taking MVI daily along with vitamin D3 and B12 at home.   Pt reports he was having issues with his thyroid  a year ago which led to 20# weight gain. He states he has since lost 18 of those 20# and is aiming to continue to lose weight with the help of his PCP. Nutrition focused physical exam shows adequate fat and muscle stores. Pt has been focusing on blood sugar control and protein intake to lose weight while increasing activity. Encouraged continued intake of protein to help with post op healing and maintenance of muscle stores while losing wt, pt understands.   Pt's mobility has been independent and has not required any assistance leading up to admission.   Encouraged adequate intake following surgery to meet increased calorie and protein needs for post op healing. Discussed Juven supplement for wound healing, pt agreeable to try.   Medications reviewed and include:  SSI Novolog  q 4 hr Semglee  10 units daily Levothyroxine  Zoloft  Unasyn    Labs reviewed:  CBG x 24 hr: 94-144 mg/dL J8r 8.9  NUTRITION - FOCUSED PHYSICAL EXAM:  Flowsheet Row Most Recent Value  Orbital Region No depletion  Upper Arm Region No depletion  Thoracic and Lumbar Region No depletion  Buccal Region No depletion  [place]  Region No depletion  Clavicle Bone Region No depletion  Clavicle and Acromion Bone Region No depletion  Scapular Bone Region No depletion  Dorsal Hand No depletion  Patellar Region No depletion  Anterior Thigh Region No depletion  Posterior Calf Region No depletion  Edema (RD Assessment) Mild  Hair Reviewed  Eyes Reviewed  Mouth Reviewed  Skin Reviewed  Nails  Reviewed    Diet Order:   Diet Order             Diet NPO time specified Except for: Ice Chips, Sips with Meds  Diet effective midnight                   EDUCATION NEEDS:   Education needs have been addressed  Skin:  Skin Assessment: Skin Integrity Issues: Skin Integrity Issues:: Diabetic Ulcer Diabetic Ulcer: osteomyelitis of L 3rd toe  Last BM:  unknown  Height:   Ht Readings from Last 1 Encounters:  01/22/24 5' 8 (1.727 m)   Weight:   Wt Readings from Last 1 Encounters:  01/29/24 109.8 kg    Ideal Body Weight:  70 kg  BMI:  Body mass index is 36.8 kg/m.  Estimated Nutritional Needs:   Kcal:  2000-2200  Protein:  100-115g  Fluid:  >/= 2L    Josette Glance, MS, RDN, LDN Clinical Dietitian I Please reach out via secure chat

## 2024-01-30 NOTE — Progress Notes (Signed)
 VASCULAR LAB    Patient had ABI at [address]  7/25/25See results below or under results review.  ABI Findings:  +---------+------------------+-----+---------+--------+  Right   Rt Pressure (mmHg)IndexWaveform Comment   +---------+------------------+-----+---------+--------+  Brachial 136                    triphasic          +---------+------------------+-----+---------+--------+  PTA     148               1.09 triphasic          +---------+------------------+-----+---------+--------+  DP      152               1.12 triphasic          +---------+------------------+-----+---------+--------+  Great Toe63                0.46                    +---------+------------------+-----+---------+--------+   +---------+------------------+-----+-----------+-------+  Left    Lt Pressure (mmHg)IndexWaveform   Comment  +---------+------------------+-----+-----------+-------+  Brachial 136                    triphasic           +---------+------------------+-----+-----------+-------+  PTA     144               1.06 triphasic           +---------+------------------+-----+-----------+-------+  DP      130               0.96 multiphasic         +---------+------------------+-----+-----------+-------+  Great Toe87                0.64                     +---------+------------------+-----+-----------+-------+   +-------+-----------+-----------+------------+------------+  ABI/TBIToday's ABIToday's TBIPrevious ABIPrevious TBI  +-------+-----------+-----------+------------+------------+  Right 1.12       0.46                                 +-------+-----------+-----------+------------+------------+  Left  1.06       0.64                                 +-------+-----------+-----------+------------+------------+       Summary:  Right: Resting right ankle-brachial index is within normal range. The  right  toe-brachial index is abnormal.   Left: Resting left ankle-brachial index is within normal range. The left  toe-brachial index is abnormal.    *See table(s) above for measurements and observations.      Electronically signed by Norman Serve on 01/24/2024 at 2:45:11 PM.        RACHEL, Conlan Miceli, RVT 01/30/2024, 10:48 AM

## 2024-01-30 NOTE — Progress Notes (Signed)
 Orthopedic Tech Progress Note Patient Details:  Daniel [name] 21-Dec-1950 [id number]  Pt states he has a PO shoe at home that a family member will bring to him here and does not want to get a second PO shoe during this stay. RN in PACU is aware of this.  Ortho Devices Type of Ortho Device: Postop shoe/boot Ortho Device/Splint Location: For LLE Ortho Device/Splint Interventions: Ordered   Post Interventions Patient Tolerated: Refused intervention  Tinnie Ronal Brasil 01/30/2024, 5:25 PM

## 2024-01-31 ENCOUNTER — Other Ambulatory Visit: Payer: Self-pay

## 2024-01-31 ENCOUNTER — Encounter (HOSPITAL_COMMUNITY): Payer: Self-pay | Admitting: Podiatry

## 2024-01-31 DIAGNOSIS — F32A Depression, unspecified: Secondary | ICD-10-CM | POA: Diagnosis not present

## 2024-01-31 DIAGNOSIS — F419 Anxiety disorder, unspecified: Secondary | ICD-10-CM | POA: Diagnosis not present

## 2024-01-31 DIAGNOSIS — M869 Osteomyelitis, unspecified: Secondary | ICD-10-CM | POA: Diagnosis not present

## 2024-01-31 LAB — GLUCOSE, CAPILLARY
Glucose-Capillary: 108 mg/dL — ABNORMAL HIGH (ref 70–99)
Glucose-Capillary: 137 mg/dL — ABNORMAL HIGH (ref 70–99)
Glucose-Capillary: 148 mg/dL — ABNORMAL HIGH (ref 70–99)
Glucose-Capillary: 159 mg/dL — ABNORMAL HIGH (ref 70–99)
Glucose-Capillary: 192 mg/dL — ABNORMAL HIGH (ref 70–99)
Glucose-Capillary: 193 mg/dL — ABNORMAL HIGH (ref 70–99)
Glucose-Capillary: 198 mg/dL — ABNORMAL HIGH (ref 70–99)

## 2024-01-31 MED ORDER — POLYETHYLENE GLYCOL 3350 17 G PO PACK
17.0000 g | PACK | Freq: Two times a day (BID) | ORAL | Status: DC
  Administered 2024-01-31: 17 g via ORAL
  Filled 2024-01-31 (×3): qty 1

## 2024-01-31 MED ORDER — AMOXICILLIN-POT CLAVULANATE 875-125 MG PO TABS
1.0000 | ORAL_TABLET | Freq: Two times a day (BID) | ORAL | Status: DC
  Administered 2024-01-31 (×2): 1 via ORAL
  Filled 2024-01-31 (×3): qty 1

## 2024-01-31 NOTE — Progress Notes (Signed)
   Subjective: 1 Day Post-Op Procedure(s) (LRB): AMPUTATION, TOE (Left) DOS: 01/30/2024  Objective: Vital signs in last 24 hours: Temp:  [97.8 F (36.6 C)-98.8 F (37.1 C)] 98.8 F (37.1 C) (08/01 2030) Pulse Rate:  [62-72] 62 (08/01 2030) Resp:  [18-20] 18 (08/01 2030) BP: (127-158)/(55-84) 127/55 (08/01 2030) SpO2:  [96 %-99 %] 96 % (08/01 2030)  Recent Labs    01/29/24 1934 01/30/24 0631  HGB 11.0* 10.6*   Recent Labs    01/29/24 1934 01/30/24 0631  WBC 13.7* 13.0*  RBC 3.76* 3.58*  HCT 34.8* 33.6*  PLT 358 350   Recent Labs    01/29/24 1934 01/30/24 0631  NA 136 138  K 4.3 4.6  CL 101 103  CO2 23 22  BUN 20 18  CREATININE 1.53* 1.56*  GLUCOSE 164* 137*  CALCIUM  8.9 8.4*   No results for input(s): LABPT, INR in the last 72 hours.  Physical Exam: Amputation site stable with sutures intact.  Significant reduction and almost complete resolution of the localized erythema that is around the toe.  Overall well-healing amputation site.  Suspecting clean margins  Assessment/Plan: 1 Day Post-Op Procedure(s) (LRB): AMPUTATION, TOE (Left) DOS: 01/30/2024  -Patient seen at bedside.  Dressings changed right.  Leave clean dry and intact until follow-up appointment in office -Minimal WBAT surgical shoe.  His wife will bring the surgical shoe with her tomorrow. -Suspect clean margins postoperatively. Rec broad-spectrum ABX x 7 days to resolve any remaining localized cellulitis -Okay for discharge from a podiatry/surgical standpoint -Will sign off  Daniel [name] 01/31/2024, 11:02 PM  Daniel [name], DPM [facility]  Dr. Thresa EMERSON [name], DPM    [address] [place], [place] [address]                Office [phone]  Fax [phone]

## 2024-01-31 NOTE — Evaluation (Signed)
 Physical Therapy Evaluation Patient Details Name: Daniel [name] MRN: [id number] DOB: 16-Nov-1950 Today's Date: 01/31/2024  History of Present Illness  Daniel [name] is a 73 y.o. male admitted 01/29/24 for osteomyelitis of the left third toe. 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 PMHx: T2DM, peripheral neuropathy, essential HTN, CKD stage IIIa, prolactinoma, anxiety, and depression.  Clinical Impression  Pt admitted with above diagnosis. PTA, pt was independent to modI for functional mobility intermittently using hurri-cane, independent with ADLs/IADLs, driving, and working as a Education officer, environmental. He lives with his wife in a three story house with 6 STE and railings. There is a full flight of stairs with a rail to the upstairs and basement. He is able to reside on the main level or in the basement which has a level entry. Pt currently with functional limitations due to the deficits listed below (see PT Problem List). He performed bed mobility with supervision and required CGA for OOB mobility using RW. Pt declined post-op shoe from ortho tech since he has one at home, but this brace has not been brought in by family yet. Instructed pt to maintain LLE NWB given his weight-bearing status is as tolerated in a surgical shoe. Pt opted to place weight into his left heel during mobility. He had no LOB. Pt will benefit from acute skilled PT to increase his independence and safety with mobility to allow discharge. Recommend HHPT to improve balance, decrease fall risk, and optimize safety within the home environment.      If plan is discharge home, recommend the following: A little help with walking and/or transfers;Help with stairs or ramp for entrance;Assist for transportation   Can travel by private vehicle        Equipment Recommendations Rolling walker (2 wheels)  Recommendations for Other Services       Functional Status Assessment Patient has had a recent decline in their functional status and demonstrates the ability to  make significant improvements in function in a reasonable and predictable amount of time.     Precautions / Restrictions Precautions Precautions: Fall Recall of Precautions/Restrictions: Intact Precaution/Restrictions Comments: Pt did not have his post-op shoe present so instructed him to keep LLE NWB, but he often allowed heel weight-bearing. Required Braces or Orthoses: Other Brace Other Brace: Post Op Shoe on LLE (pt's wife is bringing his from home, not currently in room.) Restrictions Weight Bearing Restrictions Per Provider Order: Yes LLE Weight Bearing Per Provider Order: Weight bearing as tolerated      Mobility  Bed Mobility Overal bed mobility: Needs Assistance Bed Mobility: Supine to Sit     Supine to sit: HOB elevated, Used rails, Supervision     General bed mobility comments: Pt sat up on R side of bed with increased time.    Transfers Overall transfer level: Needs assistance Equipment used: Rolling walker (2 wheels) Transfers: Sit to/from Stand, Bed to chair/wheelchair/BSC Sit to Stand: Contact guard assist   Step pivot transfers: Contact guard assist       General transfer comment: Pt stood from lowest bed height. He demonstrated proper hand placement using RW. Transferred to recliner chair on right. Good eccentric control.    Ambulation/Gait Ambulation/Gait assistance: Contact guard assist Gait Distance (Feet): 80 Feet Assistive device: Rolling walker (2 wheels) Gait Pattern/deviations: Step-to pattern, Decreased step length - right, Decreased stance time - left, Decreased weight shift to left, Trunk flexed Gait velocity: decreased Gait velocity interpretation: <1.8 ft/sec, indicate of risk for recurrent falls   General Gait Details: Pt  ambulated with small slow steps. He demonstrated a fwd flex posture. Cues for proximity to RW and increased upright posture. Pt able to correct. He manuevered RW well and navigated room/hallway. No LOB.  Stairs             Wheelchair Mobility     Tilt Bed    Modified Rankin (Stroke Patients Only)       Balance Overall balance assessment: Mild deficits observed, not formally tested                                           Pertinent Vitals/Pain Pain Assessment Pain Assessment: 0-10 Pain Score: 5  Pain Location: L foot Pain Descriptors / Indicators: Discomfort, Grimacing, Guarding, Aching Pain Intervention(s): Monitored during session, Limited activity within patient's tolerance, Patient requesting pain meds-RN notified    Home Living Family/patient expects to be discharged to:: Private residence Living Arrangements: Spouse/significant other Available Help at Discharge: Family Type of Home: House Home Access: Stairs to enter Entrance Stairs-Rails: Right;Left;Can reach both Entrance Stairs-Number of Steps: 6 Alternate Level Stairs-Number of Steps: Flight Home Layout: Multi-level;Able to live on main level with bedroom/bathroom (3 level; basement and upstairs) Home Equipment: Other (comment);BSC/3in1;Grab bars - tub/shower;Hand held shower head;Shower seat - built in (hurri-cane)      Prior Function Prior Level of Function : Independent/Modified Independent;History of Falls (last six months);Driving;Working/employed             Mobility Comments: Indep to modI with mobility. Started using cane d/t left foot pain. 6 falls in the past 64mo. ADLs Comments: Indep with ADLs/IADLs. Drives. 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     Extremity/Trunk Assessment   Upper Extremity Assessment Upper Extremity Assessment: Defer to OT evaluation    Lower Extremity Assessment Lower Extremity Assessment: LLE deficits/detail (RLE overall WFL for tasks assessed) LLE Deficits / Details: Pt in ace bandage around ankle/foot. [facility] AROM/strength of hip and knee. Limited ankle AROM and deferred strength assessment. LLE: Unable to fully assess due to immobilization LLE Sensation:  history of peripheral neuropathy LLE Coordination: decreased gross motor    Cervical / Trunk Assessment Cervical / Trunk Assessment: Normal  Communication   Communication Communication: No apparent difficulties    Cognition Arousal: Alert Behavior During Therapy: WFL for tasks assessed/performed   PT - Cognitive impairments: No apparent impairments                       PT - Cognition Comments: Pt A,Ox4 Following commands: Intact       Cueing Cueing Techniques: Verbal cues     General Comments General comments (skin integrity, edema, etc.): L foot edema    Exercises     Assessment/Plan    PT Assessment Patient needs continued PT services  PT Problem List Decreased strength;Decreased activity tolerance;Decreased balance;Decreased mobility;Decreased knowledge of use of DME       PT Treatment Interventions DME instruction;Gait training;Stair training;Functional mobility training;Therapeutic activities;Therapeutic exercise;Balance training;Neuromuscular re-education;Patient/family education    PT Goals (Current goals can be found in the Care Plan section)  Acute Rehab PT Goals Patient Stated Goal: Return Home PT Goal Formulation: With patient Time For Goal Achievement: 02/14/24 Potential to Achieve Goals: Good    Frequency Min 2X/week     Co-evaluation               AM-PAC PT  6 Clicks Mobility  Outcome Measure Help needed turning from your back to your side while in a flat bed without using bedrails?: A Little Help needed moving from lying on your back to sitting on the side of a flat bed without using bedrails?: A Little Help needed moving to and from a bed to a chair (including a wheelchair)?: A Little Help needed standing up from a chair using your arms (e.g., wheelchair or bedside chair)?: A Little Help needed to walk in hospital room?: A Little Help needed climbing 3-5 steps with a railing? : A Lot 6 Click Score: 17    End of Session  Equipment Utilized During Treatment: Gait belt Activity Tolerance: Patient tolerated treatment well Patient left: in chair;with call bell/phone within reach Nurse Communication: Mobility status;Patient requests pain meds;Weight bearing status;Other (comment) (need for chair alarm box.) PT Visit Diagnosis: Difficulty in walking, not elsewhere classified (R26.2);Unsteadiness on feet (R26.81);Other abnormalities of gait and mobility (R26.89)    Time: 8768-8741 PT Time Calculation (min) (ACUTE ONLY): 27 min   Charges:   PT Evaluation $PT Eval Moderate Complexity: 1 Mod PT Treatments $Gait Training: 8-22 mins PT General Charges $$ ACUTE PT VISIT: 1 Visit         Randall SAUNDERS, PT, DPT Acute Rehabilitation Services Office: [phone] Secure Chat Preferred   Delon CHRISTELLA Callander 01/31/2024, 1:42 PM

## 2024-01-31 NOTE — Progress Notes (Addendum)
 [facility] PROGRESS NOTE    Progress Note  [name]  FMW:[id number] DOB: 1951/06/17 DOA: 01/29/2024 PCP: Levora Reyes SAUNDERS, MD     Brief Narrative:   Daniel [name] is an 73 y.o. male past medical history of diabetes mellitus type 2, peripheral neuropathy, essential hypertension, chronic kidney disease stage IIIa, history of prolactinoma anxiety and depression comes in to the podiatrist office first on mellitus and diabetic foot ulcer on the third toe and necrosis, MRI of the foot on 01/23/2024 show osteomyelitis of the third distal phalanges and moderate arthritic changes 1st through 3rd TMT joint and edema.  Assessment/Plan:   Osteomyelitis of third toe of left foot/  Diabetic foot infection [facility]): Status post left third toe amputation. Continue IV Unasyn  and linezolid .SABRA ABIs are pending. Further management per podiatry.  Uncontrolled diabetes mellitus type 2/ Diabetic peripheral neuropathy/Hypoglycemia: Hemoglobin A1c of 8.9, continue long-acting insulin  plus sliding scale.  Essential hypertension: Blood pressure slightly elevated, continue lisinopril  we will get it this morning.  Chronic kidney disease stage IIIa: His creatinine appears to be at baseline around 1.5.  History of prolactinoma: Continue Cabergoline .  Hypothyroidism: Continue Synthroid .  Hyperlipidemia: Continue statins.  Depression/anxiety: Continue sertraline .  Morbid Obesity: noted   DVT prophylaxis: lovenox Family Communication:none Status is: Inpatient Remains inpatient appropriate because: Acute osteomyelitis    Code Status:     Code Status Orders  (From admission, onward)           Start     Ordered   01/29/24 2227  Do not attempt resuscitation (DNR) Pre-Arrest Interventions Desired  Continuous       Question Answer Comment  If pulseless and not breathing No CPR or chest compressions.   In Pre-Arrest Conditions (Patient Has Pulse and Is Breathing) May intubate, use  advanced airway interventions and cardioversion/ACLS medications if appropriate or indicated. May transfer to ICU.   Consent: Discussion documented in EHR or advanced directives reviewed      01/29/24 2227           Code Status History     Date Active Date Inactive Code Status Order ID Comments User Context   02/26/2020 1539 02/27/2020 1653 Full Code [id number]  Vernetta Lonni GRADE, MD Inpatient         IV Access:   Peripheral IV   Procedures and diagnostic studies:   DG Foot Complete Left Result Date: 01/30/2024 CLINICAL DATA:  Post of evaluation. EXAM: LEFT FOOT - COMPLETE 3+ VIEW COMPARISON:  Radiograph dated 01/22/2024. FINDINGS: Status post amputation of the phalanges of the third digit. No acute fracture or dislocation. The bones are osteopenic. There is mild soft tissue swelling of the foot. No radiopaque foreign object or soft tissue gas. Overlying dressing noted. IMPRESSION: Status post amputation of the phalanges of the third digit. Electronically Signed   By: Vanetta Daniel M.D.   On: 01/30/2024 17:39     Medical Consultants:   None.   Subjective:    Daniel [name] Limes pain is controlled.  Objective:    Vitals:   01/30/24 1940 01/30/24 2357 01/31/24 0502 01/31/24 0805  BP: (!) 138/50 (!) 128/59 (!) 156/84 (!) 158/75  Pulse: 66 69 72 66  Resp: 18 18 20 18   Temp: 98.2 F (36.8 C) 98.2 F (36.8 C) 98.1 F (36.7 C) 97.8 F (36.6 C)  TempSrc: Oral Oral Oral   SpO2: 98% 96% 98% 99%  Weight:       SpO2: 99 %   Intake/Output Summary (  Last 24 hours) at 01/31/2024 0848 Last data filed at 01/31/2024 0503 Gross per 24 hour  Intake 1100 ml  Output 955 ml  Net 145 ml   Filed Weights   01/29/24 1903  Weight: 109.8 kg    Exam: General exam: In no acute distress. Respiratory system: Good air movement and clear to auscultation. Cardiovascular system: S1 & S2 heard, RRR. No JVD. Gastrointestinal system: Abdomen is nondistended, soft and nontender.   Extremities: No pedal edema. Skin:  Psychiatry: Judgement and insight appear normal. Mood & affect appropriate.    Data Reviewed:    Labs: Basic Metabolic Panel: Recent Labs  Lab 01/29/24 1934 01/30/24 0631  NA 136 138  K 4.3 4.6  CL 101 103  CO2 23 22  GLUCOSE 164* 137*  BUN 20 18  CREATININE 1.53* 1.56*  CALCIUM  8.9 8.4*   GFR Estimated Creatinine Clearance: 49.2 mL/min (A) (by C-G formula based on SCr of 1.56 mg/dL (H)). Liver Function Tests: Recent Labs  Lab 01/29/24 1934  AST 17  ALT 12  ALKPHOS 64  BILITOT 0.5  PROT 7.2  ALBUMIN 3.4*   No results for input(s): LIPASE, AMYLASE in the last 168 hours. No results for input(s): AMMONIA in the last 168 hours. Coagulation profile No results for input(s): INR, PROTIME in the last 168 hours. COVID-19 Labs  No results for input(s): DDIMER, FERRITIN, LDH, CRP in the last 72 hours.  Lab Results  Component Value Date   SARSCOV2NAA Not Detected 03/22/2020   SARSCOV2NAA NEGATIVE 02/23/2020    CBC: Recent Labs  Lab 01/29/24 1934 01/30/24 0631  WBC 13.7* 13.0*  NEUTROABS 9.8*  --   HGB 11.0* 10.6*  HCT 34.8* 33.6*  MCV 92.6 93.9  PLT 358 350   Cardiac Enzymes: No results for input(s): CKTOTAL, CKMB, CKMBINDEX, TROPONINI in the last 168 hours. BNP (last 3 results) No results for input(s): PROBNP in the last 8760 hours. CBG: Recent Labs  Lab 01/30/24 1654 01/30/24 1749 01/30/24 2102 01/30/24 2359 01/31/24 0459  GLUCAP 81 65* 221* 159* 108*   D-Dimer: No results for input(s): DDIMER in the last 72 hours. Hgb A1c: Recent Labs    01/30/24 0631  HGBA1C 8.9*   Lipid Profile: No results for input(s): CHOL, HDL, LDLCALC, TRIG, CHOLHDL, LDLDIRECT in the last 72 hours. Thyroid  function studies: No results for input(s): TSH, T4TOTAL, T3FREE, THYROIDAB in the last 72 hours.  Invalid input(s): FREET3 Anemia work up: No results for input(s):  VITAMINB12, FOLATE, FERRITIN, TIBC, IRON, RETICCTPCT in the last 72 hours. Sepsis Labs: Recent Labs  Lab 01/29/24 1934 01/30/24 0631  WBC 13.7* 13.0*   Microbiology Recent Results (from the past 240 hours)  WOUND CULTURE     Status: None   Collection Time: 01/22/24  2:40 PM  Result Value Ref Range Status   MICRO NUMBER: [id number]  Final   SPECIMEN QUALITY: Adequate  Final   SOURCE: WOUND  Final   STATUS: FINAL  Final   GRAM STAIN:   Final    No epithelial cells seen No white blood cells seen Moderate Gram negative bacilli Few Gram positive cocci in pairs   RESULT:   Final    A mix of organisms of questionable significance was recovered on culture and not further identified. (Note: Growth did not detect the presence of S.aureus, beta-hemolytic Streptococci or P.aeruginosa).  Blood culture (routine x 2)     Status: None (Preliminary result)   Collection Time: 01/29/24  7:32 PM   Specimen:  BLOOD LEFT ARM  Result Value Ref Range Status   Specimen Description BLOOD LEFT ARM  Final   Special Requests   Final    BOTTLES DRAWN AEROBIC AND ANAEROBIC Blood Culture adequate volume   Culture   Final    NO GROWTH 2 DAYS Performed at [facility], [address] [place], [place] [address]    Report Status PENDING  Incomplete  Blood culture (routine x 2)     Status: None (Preliminary result)   Collection Time: 01/29/24  8:11 PM   Specimen: BLOOD  Result Value Ref Range Status   Specimen Description BLOOD RIGHT ANTECUBITAL  Final   Special Requests   Final    BOTTLES DRAWN AEROBIC AND ANAEROBIC Blood Culture adequate volume   Culture   Final    NO GROWTH 2 DAYS Performed at [facility], [address], [place], [place] [address]    Report Status PENDING  Incomplete     Medications:    aspirin  EC  81 mg Oral QHS   cabergoline   0.5 mg Oral Once per day on Monday Thursday   insulin  aspart  0-15 Units Subcutaneous Q4H   insulin  glargine-yfgn  10 Units  Subcutaneous Q24H   levothyroxine   75 mcg Oral Q0600   linezolid   600 mg Oral Q12H   lisinopril   20 mg Oral Daily   mirabegron  ER  25 mg Oral Daily   montelukast   10 mg Oral QHS   pregabalin   100 mg Oral BID   rosuvastatin   10 mg Oral Daily   sertraline   200 mg Oral Daily   testosterone   2.5 g Transdermal Daily   Continuous Infusions:  ampicillin -sulbactam (UNASYN ) IV 3 g (01/31/24 0539)      LOS: 2 days   Erle Odell Castor  [facility]  01/31/2024, 8:48 AM

## 2024-01-31 NOTE — Anesthesia Postprocedure Evaluation (Signed)
 Anesthesia Post Note  Patient: Daniel [name]  Procedure(s) Performed: AMPUTATION, TOE (Left: Toe)     Patient location during evaluation: PACU Anesthesia Type: MAC Level of consciousness: awake and alert Pain management: pain level controlled Vital Signs Assessment: post-procedure vital signs reviewed and stable Respiratory status: spontaneous breathing, nonlabored ventilation, respiratory function stable and patient connected to nasal cannula oxygen Cardiovascular status: stable and blood pressure returned to baseline Postop Assessment: no apparent nausea or vomiting Anesthetic complications: no   No notable events documented.  Last Vitals:  Vitals:   01/31/24 0502 01/31/24 0805  BP: (!) 156/84 (!) 158/75  Pulse: 72 66  Resp: 20 18  Temp: 36.7 C 36.6 C  SpO2: 98% 99%    Last Pain:  Vitals:   01/31/24 0502  TempSrc: Oral  PainSc:                  Daniel [name]

## 2024-02-01 ENCOUNTER — Other Ambulatory Visit (HOSPITAL_COMMUNITY): Payer: Self-pay

## 2024-02-01 DIAGNOSIS — E11628 Type 2 diabetes mellitus with other skin complications: Secondary | ICD-10-CM | POA: Diagnosis not present

## 2024-02-01 DIAGNOSIS — L089 Local infection of the skin and subcutaneous tissue, unspecified: Secondary | ICD-10-CM

## 2024-02-01 DIAGNOSIS — M869 Osteomyelitis, unspecified: Secondary | ICD-10-CM | POA: Diagnosis not present

## 2024-02-01 LAB — GLUCOSE, CAPILLARY
Glucose-Capillary: 121 mg/dL — ABNORMAL HIGH (ref 70–99)
Glucose-Capillary: 150 mg/dL — ABNORMAL HIGH (ref 70–99)

## 2024-02-01 MED ORDER — LISINOPRIL 20 MG PO TABS
40.0000 mg | ORAL_TABLET | Freq: Every day | ORAL | Status: DC
  Administered 2024-02-01: 40 mg via ORAL
  Filled 2024-02-01: qty 2

## 2024-02-01 MED ORDER — LINEZOLID 600 MG PO TABS
600.0000 mg | ORAL_TABLET | Freq: Two times a day (BID) | ORAL | 0 refills | Status: AC
  Filled 2024-02-01: qty 28, 14d supply, fill #0

## 2024-02-01 MED ORDER — AMOXICILLIN-POT CLAVULANATE 875-125 MG PO TABS
1.0000 | ORAL_TABLET | Freq: Two times a day (BID) | ORAL | 0 refills | Status: AC
  Filled 2024-02-01: qty 28, 14d supply, fill #0

## 2024-02-01 MED ORDER — AMOXICILLIN-POT CLAVULANATE 875-125 MG PO TABS
1.0000 | ORAL_TABLET | Freq: Two times a day (BID) | ORAL | Status: DC
  Administered 2024-02-01: 1 via ORAL

## 2024-02-01 NOTE — Progress Notes (Signed)
 Patient verbalized understanding of dc instuctions. Toc ,meds and paperwork and belongings given to patient

## 2024-02-01 NOTE — Evaluation (Signed)
 Occupational Therapy Evaluation and Discharge Patient Details Name: Daniel [name] MRN: [id number] DOB: 08/19/1950 Today's Date: 02/01/2024   History of Present Illness   Daniel [name] is a 73 y.o. male admitted 01/29/24 for osteomyelitis of the left third toe. 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 PMHx: T2DM, peripheral neuropathy, essential HTN, CKD stage IIIa, prolactinoma, anxiety, and depression.     Clinical Impressions At baseline, pt is Independent with ADLs and IADLs, Ind to Mod I with mobility with intermittent use of a hurri-cane. Pt reports 6 falls in past 6 months. Pt presents near baseline PLOF for ADLs with pt demonstrating ability to complete ADLs largely Ind to Supervision for safety. Pt also demonstrating ability to complete functional transfers/mobility with a RW and with L post-op shoe donned with Supervision for safety. Pt does present with mildly decreased balance and noted edematous L foot. Plan for pt to discharge home later today with assist of wife and plan for [facility] PT to address balance and mobility. No further benefit from acute skilled OT services and no post acute skilled OT needs anticipated. Pt is ready to discharge home from an OT standpoint. OT is signing off at this time.      If plan is discharge home, recommend the following:   A little help with walking and/or transfers;A little help with bathing/dressing/bathroom;Assistance with cooking/housework;Assist for transportation;Help with stairs or ramp for entrance     Functional Status Assessment   Patient has had a recent decline in their functional status and demonstrates the ability to make significant improvements in function in a reasonable and predictable amount of time.     Equipment Recommendations   None recommended by OT (Pt already has needed equipment)     Recommendations for Other Services         Precautions/Restrictions   Precautions Precautions: Fall Recall of Precautions/Restrictions:  Intact Required Braces or Orthoses: Other Brace Other Brace: L LE posy-op shoe Restrictions Weight Bearing Restrictions Per Provider Order: Yes LLE Weight Bearing Per Provider Order: Weight bearing as tolerated Other Position/Activity Restrictions: with use of L LE post-op shoe     Mobility Bed Mobility               General bed mobility comments: Pt sitting at EOB at beginning and end of session    Transfers Overall transfer level: Needs assistance Equipment used: Rolling walker (2 wheels) Transfers: Sit to/from Stand, Bed to chair/wheelchair/BSC Sit to Stand: Supervision     Step pivot transfers: Supervision     General transfer comment: Pt wearing post-op shoe this session and demonstrated impoved balance and independence with wear as compared to past sessions without post-op shoe in room.      Balance Overall balance assessment: Mild deficits observed, not formally tested (No overt LOB this session)                                         ADL either performed or assessed with clinical judgement   ADL Overall ADL's : Needs assistance/impaired Eating/Feeding: Independent;Sitting   Grooming: Supervision/safety;Standing   Upper Body Bathing: Modified independent;Set up;Sitting   Lower Body Bathing: Supervison/ safety;Sit to/from stand   Upper Body Dressing : Independent;Sitting   Lower Body Dressing: Supervision/safety;Sit to/from stand;Contact guard assist Lower Body Dressing Details (indicate cue type and reason): CGA to adjust post-op shoe, otherwise Supervision for safety Toilet Transfer: Supervision/safety;Rolling walker (2 wheels);Regular  Social worker and Hygiene: Supervision/safety;Sit to/from stand       Functional mobility during ADLs: Supervision/safety;Rolling walker (2 wheels) General ADL Comments: Supervision for safety     Vision Baseline Vision/History: 1 Wears glasses Ability to See in  Adequate Light: 0 Adequate (with glasses) Patient Visual Report: No change from baseline       Perception         Praxis         Pertinent Vitals/Pain Pain Assessment Pain Assessment: Faces Faces Pain Scale: Hurts a little bit Pain Location: L foot Pain Descriptors / Indicators: Discomfort     Extremity/Trunk Assessment Upper Extremity Assessment Upper Extremity Assessment: Right hand dominant;Overall [facility] for tasks assessed   Lower Extremity Assessment Lower Extremity Assessment: Defer to PT evaluation   Cervical / Trunk Assessment Cervical / Trunk Assessment: Normal   Communication Communication Communication: No apparent difficulties   Cognition Arousal: Alert Behavior During Therapy: WFL for tasks assessed/performed Cognition: No apparent impairments             OT - Cognition Comments: AAOx4 and pleasant throughout session.                 Following commands: Intact       Cueing  General Comments   Cueing Techniques: Verbal cues  noted edematous L foot. RN and pt's wife present during majority of session.   Exercises     Shoulder Instructions      Home Living Family/patient expects to be discharged to:: Private residence Living Arrangements: Spouse/significant other Available Help at Discharge: Family;Available 24 hours/day Type of Home: House Home Access: Stairs to enter [employer] of Steps: 6 Entrance Stairs-Rails: Right;Left;Can reach both Home Layout: Multi-level;Able to live on main level with bedroom/bathroom (3 level; basement and upstairs) Alternate Level Stairs-Number of Steps: Flight Alternate Level Stairs-Rails: Left Bathroom Shower/Tub: Producer, television/film/video: Handicapped height     Home Equipment: Other (comment);BSC/3in1;Grab bars - tub/shower;Hand held shower head;Shower seat - built in (hurri-cane)          Prior Functioning/Environment Prior Level of Function : Independent/Modified  Independent;History of Falls (last six months);Driving;Working/employed             Mobility Comments: Indep to modI with mobility. Started using cane d/t left foot pain. 6 falls in the past 13mo. ADLs Comments: Indep with ADLs/IADLs. Drives. 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    OT Problem List: Impaired balance (sitting and/or standing)   OT Treatment/Interventions:        OT Goals(Current goals can be found in the care plan section)   Acute Rehab OT Goals Patient Stated Goal: to return home OT Goal Formulation: All assessment and education complete, DC therapy   OT Frequency:       Co-evaluation              AM-PAC OT 6 Clicks Daily Activity     Outcome Measure Help from another person eating meals?: None Help from another person taking care of personal grooming?: A Little Help from another person toileting, which includes using toliet, bedpan, or urinal?: A Little Help from another person bathing (including washing, rinsing, drying)?: A Little Help from another person to put on and taking off regular upper body clothing?: None Help from another person to put on and taking off regular lower body clothing?: A Little 6 Click Score: 20   End of Session Equipment Utilized During Treatment: Rolling walker (  2 wheels);Other (comment) (L LE post-op shoe) Nurse Communication: Mobility status;Other (comment) (Pt ready to discharge from an OT standpoint)  Activity Tolerance: Patient tolerated treatment well Patient left: in bed;with call bell/phone within reach;with nursing/sitter in room;with family/visitor present (sitting EOB)  OT Visit Diagnosis: Unsteadiness on feet (R26.81)                Time: 8966-8945 OT Time Calculation (min): 21 min Charges:  OT General Charges $OT Visit: 1 Visit OT Evaluation $OT Eval Low Complexity: 1 Low  Margarie Rockey HERO., OTR/L, MA Acute Rehab [phone]   Margarie FORBES Horns 02/01/2024, 11:04 AM

## 2024-02-01 NOTE — TOC Transition Note (Addendum)
 Transition of Care [facility]) - Discharge Note   Patient Details  Name: Daniel [name] MRN: [id number] Date of Birth: 02-25-1951  Transition of [facility]) CM/SW Contact:  Robynn Eileen Hoose, RN Phone Number: 02/01/2024, 9:13 AM   Clinical Narrative:     Patient is being discharged today. Spoke with patient and is in agreement with getting a rolling walker and getting home health services. Patient used [facility] in the past for [facility] and would like to use them again. Message sent to [facility] with [facility], awaiting confirmation of acceptance. DME ordered through Jermaine with [employer] to be delivered to the bedside.   1015: Message from [facility], unable to accept patient for [facility] services at this time.  1018: Cory with [facility] able to accept patient for ordered [facility] services. Contact information placed on AVS.  Final next level of care: Home w Home Health Services Barriers to Discharge: No Barriers Identified   Patient Goals and CMS Choice            Discharge Placement                       Discharge Plan and Services Additional resources added to the After Visit Summary for                  DME Arranged: Walker rolling DME Agency: [employer] Date DME Agency Contacted: 02/01/24 Time DME Agency Contacted: [phone] Representative spoke with at DME Agency: [place] HH Arranged: PT, OT HH Agency: [facility] Date [facility] Agency Contacted: 02/01/24 Time HH Agency Contacted: [phone] Representative spoke with at [facility] Agency: [place]  Social Drivers of Health (SDOH) Interventions SDOH Screenings   Food Insecurity: No Food Insecurity (01/30/2024)  Housing: Low Risk  (01/30/2024)  Transportation Needs: No Transportation Needs (01/30/2024)  Utilities: Not At Risk (01/30/2024)  Alcohol Screen: Low Risk  (02/15/2022)  Depression (PHQ2-9): Low Risk  (01/22/2024)  Financial Resource Strain: Low Risk  (01/27/2024)  Physical Activity: Insufficiently Active (01/27/2024)  Social  Connections: Socially Integrated (01/30/2024)  Stress: No Stress Concern Present (01/27/2024)  Tobacco Use: Low Risk  (01/30/2024)  Health Literacy: Adequate Health Literacy (02/28/2023)     Readmission Risk Interventions     No data to display

## 2024-02-01 NOTE — Discharge Summary (Signed)
 Physician Discharge Summary  Daniel [name] FMW:[id number] DOB: 16-Sep-1950 DOA: 01/29/2024  PCP: Daniel Reyes SAUNDERS, MD  Admit date: 01/29/2024 Discharge date: 02/01/2024  Admitted From: Home Disposition:  Home  Recommendations for Outpatient Follow-up:  Follow up with podiatry in 1-2 weeks Please obtain BMP/CBC in one week   Home Health:no Equipment/Devices:None  Discharge Condition:Stable CODE STATUS:Full Diet recommendation: Heart Healthy   Brief/Interim Summary: 73 y.o. male past medical history of diabetes mellitus type 2, peripheral neuropathy, essential hypertension, chronic kidney disease stage IIIa, history of prolactinoma anxiety and depression comes in to the podiatrist office first on mellitus and diabetic foot ulcer on the third toe and necrosis, MRI of the foot on 01/23/2024 show osteomyelitis of the third distal phalanges and moderate arthritic changes 1st through 3rd TMT joint and edema.   Discharge Diagnoses:  Principal Problem:   Osteomyelitis of third toe of left foot (HCC) Active Problems:   Diabetic foot infection (HCC)   Anxiety and depression   Diabetic peripheral neuropathy (HCC)   Hypothyroidism, secondary   Prolactinoma (HCC)   Hypertension associated with diabetes (HCC)   Chronic kidney disease, stage 3a (HCC)   Insulin  dependent type 2 diabetes mellitus (HCC)  Osteomyelitis of the third toe the left foot/diabetic foot: You started on IV saline last night. Podiatry was consulted and status post left toe amputation. He was transition to oral linezolid  and Augmentin  which will continue 14 days. Follow-up with podiatry as an outpatient.  Uncontrolled diabetes mellitus type 2 with hyperglycemia: A1c of 8.9. No changes made to his insulin  regimen.  Essential hypertension: No changes made to his medication.  Chronic kidney disease stage IIIa: His creatinine remained at baseline.  History of prolactinoma: Continue Cabergoline .    Hypothyroidism: Continue Synthroid .   Hyperlipidemia: Continue statins.   Depression/anxiety: Continue sertraline .   Morbid Obesity: noted  Discharge Instructions  Discharge Instructions     Diet - low sodium heart healthy   Complete by: As directed    Increase activity slowly   Complete by: As directed    No wound care   Complete by: As directed       Allergies as of 02/01/2024       Reactions   Codeine Palpitations, Shortness Of Breath, Other (See Comments)   Pioglitazone Other (See Comments), Shortness Of Breath, Swelling   Blood sugar went up and down and sweating profusely        Medication List     TAKE these medications    albuterol  108 (90 Base) MCG/ACT inhaler Commonly known as: VENTOLIN  HFA INHALE 2 PUFFS BY MOUTH EVERY 6 HOURS AS NEEDED FOR WHEEZING OR SHORTNESS OF BREATH   amoxicillin -clavulanate 875-125 MG tablet Commonly known as: AUGMENTIN  Take 1 tablet by mouth every 12 (twelve) hours for 14 days.   aspirin  EC 81 MG tablet Take 81 mg by mouth at bedtime.   cabergoline  0.5 MG tablet Commonly known as: DOSTINEX  Take 0.5 mg by mouth 2 (two) times a week.   FreeStyle Libre 3 Plus Sensor Misc CHANGE EVERY 15 DAYS   Gemtesa 75 MG Tabs Generic drug: Vibegron Take 75 mg by mouth at bedtime.   HumaLOG Mix 75/25 KwikPen (75-25) 100 UNIT/ML KwikPen Generic drug: Insulin  Lispro Prot & Lispro 50-80 Units 2 (two) times daily before a meal.   levothyroxine  75 MCG tablet Commonly known as: SYNTHROID  Take 75 mcg by mouth daily before breakfast.   linezolid  600 MG tablet Commonly known as: ZYVOX  Take 1 tablet (600 mg total)  by mouth every 12 (twelve) hours for 14 days.   lisinopril  20 MG tablet Commonly known as: ZESTRIL  Take 1 tablet by mouth once daily   montelukast  10 MG tablet Commonly known as: SINGULAIR  Take 1 tablet (10 mg total) by mouth at bedtime.   multivitamin with minerals Tabs tablet Take 1 tablet by mouth daily.    OneTouch Delica Plus Lancet33G Misc USE 1 TO CHECK GLUCOSE ONCE DAILY   glucose blood test strip Check glucose twice daily.   OneTouch Ultra test strip Generic drug: glucose blood USE 1 STRIP TO CHECK GLUCOSE ONCE DAILY   Pen Needles 32G X 4 MM Misc 1 Application by Does not apply route daily.   pregabalin  100 MG capsule Commonly known as: LYRICA  Take 100 mg by mouth 2 (two) times daily. One capsule by mouth twice a day   rosuvastatin  10 MG tablet Commonly known as: Crestor  Take 1 tablet (10 mg total) by mouth daily.   sertraline  100 MG tablet Commonly known as: ZOLOFT  Take 2 tablets (200 mg total) by mouth daily. 2 tablets daily. What changed: additional instructions   Testosterone  1.62 % Gel Apply 1 Application topically daily.   traMADol  50 MG tablet Commonly known as: ULTRAM  TAKE 1 TABLET BY MOUTH EVERY 6 HOURS AS NEEDED   VITAMIN B 12 PO Take 1,000 mcg by mouth daily.   Vitamin D  125 MCG (5000 UT) Caps Take 5,000 Units by mouth daily.        Follow-up Information     Daniel Reyes SAUNDERS, MD Follow up.   Specialties: Family Medicine, Sports Medicine Contact information: [address] [place] [address] [phone]                Allergies  Allergen Reactions   Codeine Palpitations, Shortness Of Breath and Other (See Comments)   Pioglitazone Other (See Comments), Shortness Of Breath and Swelling    Blood sugar went up and down and sweating profusely    Consultations: Podiatry   Procedures/Studies: DG Foot Complete Left Result Date: 01/30/2024 CLINICAL DATA:  Post of evaluation. EXAM: LEFT FOOT - COMPLETE 3+ VIEW COMPARISON:  Radiograph dated 01/22/2024. FINDINGS: Status post amputation of the phalanges of the third digit. No acute fracture or dislocation. The bones are osteopenic. There is mild soft tissue swelling of the foot. No radiopaque foreign object or soft tissue gas. Overlying dressing noted. IMPRESSION: Status post amputation  of the phalanges of the third digit. Electronically Signed   By: Vanetta Chou M.D.   On: 01/30/2024 17:39   VAS US  ABI WITH/WO TBI Result Date: 01/24/2024  LOWER EXTREMITY DOPPLER STUDY Patient Name:  Daniel [name]  Date of Exam:   01/24/2024 Medical Rec #: [id number]       Accession #:    [id number] Date of Birth: [birth year]       Patient Gender: M Patient Age:   38 years Exam Location:  [address] Procedure:      VAS US  ABI WITH/WO TBI Referring Phys: [employer] --------------------------------------------------------------------------------  Indications: Diabetic ulcer of left toe High Risk Factors: Hypertension, hyperlipidemia, Diabetes, no history of                    smoking.  Comparison Study: N/A Performing Technologist: Dena Pane  Examination Guidelines: A complete evaluation includes at minimum, Doppler waveform signals and systolic blood pressure reading at the level of bilateral brachial, anterior tibial, and posterior tibial arteries, when vessel segments are accessible. Bilateral testing is  considered an integral part of a complete examination. Photoelectric Plethysmograph (PPG) waveforms and toe systolic pressure readings are included as required and additional duplex testing as needed. Limited examinations for reoccurring indications may be performed as noted.  ABI Findings: +---------+------------------+-----+---------+--------+ Right    Rt Pressure (mmHg)IndexWaveform Comment  +---------+------------------+-----+---------+--------+ Brachial 136                    triphasic         +---------+------------------+-----+---------+--------+ PTA      148               1.09 triphasic         +---------+------------------+-----+---------+--------+ DP       152               1.12 triphasic         +---------+------------------+-----+---------+--------+ Great Toe63                0.46                   +---------+------------------+-----+---------+--------+  +---------+------------------+-----+-----------+-------+ Left     Lt Pressure (mmHg)IndexWaveform   Comment +---------+------------------+-----+-----------+-------+ Brachial 136                    triphasic          +---------+------------------+-----+-----------+-------+ PTA      144               1.06 triphasic          +---------+------------------+-----+-----------+-------+ DP       130               0.96 multiphasic        +---------+------------------+-----+-----------+-------+ Great Toe87                0.64                    +---------+------------------+-----+-----------+-------+ +-------+-----------+-----------+------------+------------+ ABI/TBIToday's ABIToday's TBIPrevious ABIPrevious TBI +-------+-----------+-----------+------------+------------+ Right  1.12       0.46                                +-------+-----------+-----------+------------+------------+ Left   1.06       0.64                                +-------+-----------+-----------+------------+------------+  Summary: Right: Resting right ankle-brachial index is within normal range. The right toe-brachial index is abnormal. Left: Resting left ankle-brachial index is within normal range. The left toe-brachial index is abnormal. *See table(s) above for measurements and observations.  Electronically signed by Norman Serve on 01/24/2024 at 2:45:11 PM.    Final    MR FOOT LEFT WO CONTRAST Result Date: 01/23/2024 CLINICAL DATA:  Ulceration of the left third digit. History of diabetes. EXAM: MRI OF THE LEFT FOOT WITHOUT CONTRAST TECHNIQUE: Multiplanar, multisequence MR imaging of the left forefoot was performed. No intravenous contrast was administered. COMPARISON:  Left foot radiographs dated 01/22/2024. FINDINGS: Bones/Joint/Cartilage Ulceration along the distal third digit tracks to the underlying tuft of the third distal phalanx which demonstrates associated T2/STIR hyperintense marrow edema  with confluent T1 hypointense marrow signal, compatible with osteomyelitis. No convincing marrow signal abnormality identified elsewhere to suggest osteomyelitis. No joint effusion. No fracture dislocation. Mild osteoarthritis of the first MTP joint space narrowing and osteophytosis. Mild-to-moderate joint space narrowing, subchondral  cystic change, and marginal spurring of the first through third TMT joints with patchy periarticular edema, as can be seen in the setting of neuropathic arthropathy. Ligaments No acute ligamentous abnormality identified. Muscles and Tendons Flexor and extensor compartment tendons are intact. No significant tenosynovitis. Mild fatty atrophy of the intrinsic foot musculature with increased T2 hyperintense signal, which could reflect chronic denervation changes, however, myositis cannot be entirely excluded. Soft tissue Ulceration at the distal third digit, as above. No abscess. Diffuse subcutaneous edema extending along the dorsal foot, most pronounced laterally. IMPRESSION: 1. Ulceration of the distal third digit with osteomyelitis of the underlying third distal phalanx. No abscess. 2. Mild-to-moderate arthritic changes of the first through third TMT joints with patchy periarticular edema, as can be seen in the setting of neuropathic arthropathy. 3. Mild fatty atrophy of the intrinsic foot musculature with increased T2 hyperintense signal, which could reflect chronic denervation changes, however, myositis cannot be entirely excluded. Electronically Signed   By: Harrietta Sherry M.D.   On: 01/23/2024 15:02   DG Foot Complete Left Result Date: 01/22/2024 Please see detailed radiograph report in office note.    Subjective: No complaints  Discharge Exam: Vitals:   02/01/24 0420 02/01/24 0803  BP: (!) 142/69 (!) 140/62  Pulse: (!) 52 (!) 56  Resp: 17 16  Temp: 98 F (36.7 C) 97.7 F (36.5 C)  SpO2: 98% 100%   Vitals:   01/31/24 1426 01/31/24 2030 02/01/24 0420 02/01/24  0803  BP: 139/61 (!) 127/55 (!) 142/69 (!) 140/62  Pulse: 71 62 (!) 52 (!) 56  Resp: 20 18 17 16   Temp: 98 F (36.7 C) 98.8 F (37.1 C) 98 F (36.7 C) 97.7 F (36.5 C)  TempSrc: Oral Oral    SpO2: 96% 96% 98% 100%  Weight:        General: Pt is alert, awake, not in acute distress Cardiovascular: RRR, S1/S2 +, no rubs, no gallops Respiratory: CTA bilaterally, no wheezing, no rhonchi Abdominal: Soft, NT, ND, bowel sounds + Extremities: no edema, no cyanosis    The results of significant diagnostics from this hospitalization (including imaging, microbiology, ancillary and laboratory) are listed below for reference.     Microbiology: Recent Results (from the past 240 hours)  WOUND CULTURE     Status: None   Collection Time: 01/22/24  2:40 PM  Result Value Ref Range Status   MICRO NUMBER: [id number]  Final   SPECIMEN QUALITY: Adequate  Final   SOURCE: WOUND  Final   STATUS: FINAL  Final   GRAM STAIN:   Final    No epithelial cells seen No white blood cells seen Moderate Gram negative bacilli Few Gram positive cocci in pairs   RESULT:   Final    A mix of organisms of questionable significance was recovered on culture and not further identified. (Note: Growth did not detect the presence of S.aureus, beta-hemolytic Streptococci or P.aeruginosa).  Blood culture (routine x 2)     Status: None (Preliminary result)   Collection Time: 01/29/24  7:32 PM   Specimen: BLOOD LEFT ARM  Result Value Ref Range Status   Specimen Description BLOOD LEFT ARM  Final   Special Requests   Final    BOTTLES DRAWN AEROBIC AND ANAEROBIC Blood Culture adequate volume   Culture   Final    NO GROWTH 3 DAYS Performed at [facility], [address] [place], [place] [address]    Report Status PENDING  Incomplete  Blood culture (routine  x 2)     Status: None (Preliminary result)   Collection Time: 01/29/24  8:11 PM   Specimen: BLOOD  Result Value Ref Range Status   Specimen Description BLOOD  RIGHT ANTECUBITAL  Final   Special Requests   Final    BOTTLES DRAWN AEROBIC AND ANAEROBIC Blood Culture adequate volume   Culture   Final    NO GROWTH 3 DAYS Performed at [facility], [address], [place], [place] [address]    Report Status PENDING  Incomplete     Labs: BNP (last 3 results) No results for input(s): BNP in the last 8760 hours. Basic Metabolic Panel: Recent Labs  Lab 01/29/24 1934 01/30/24 0631  NA 136 138  K 4.3 4.6  CL 101 103  CO2 23 22  GLUCOSE 164* 137*  BUN 20 18  CREATININE 1.53* 1.56*  CALCIUM  8.9 8.4*   Liver Function Tests: Recent Labs  Lab 01/29/24 1934  AST 17  ALT 12  ALKPHOS 64  BILITOT 0.5  PROT 7.2  ALBUMIN 3.4*   No results for input(s): LIPASE, AMYLASE in the last 168 hours. No results for input(s): AMMONIA in the last 168 hours. CBC: Recent Labs  Lab 01/29/24 1934 01/30/24 0631  WBC 13.7* 13.0*  NEUTROABS 9.8*  --   HGB 11.0* 10.6*  HCT 34.8* 33.6*  MCV 92.6 93.9  PLT 358 350   Cardiac Enzymes: No results for input(s): CKTOTAL, CKMB, CKMBINDEX, TROPONINI in the last 168 hours. BNP: Invalid input(s): POCBNP CBG: Recent Labs  Lab 01/31/24 1301 01/31/24 1720 01/31/24 2351 02/01/24 0429 02/01/24 0801  GLUCAP 137* 192* 148* 121* 150*   D-Dimer No results for input(s): DDIMER in the last 72 hours. Hgb A1c Recent Labs    01/30/24 0631  HGBA1C 8.9*   Lipid Profile No results for input(s): CHOL, HDL, LDLCALC, TRIG, CHOLHDL, LDLDIRECT in the last 72 hours. Thyroid  function studies No results for input(s): TSH, T4TOTAL, T3FREE, THYROIDAB in the last 72 hours.  Invalid input(s): FREET3 Anemia work up No results for input(s): VITAMINB12, FOLATE, FERRITIN, TIBC, IRON, RETICCTPCT in the last 72 hours. Urinalysis    Component Value Date/Time   BILIRUBINUR negative 08/22/2022 0958   KETONESUR negative 08/22/2022 0958   PROTEINUR negative 08/22/2022  0958   UROBILINOGEN negative (A) 08/22/2022 0958   NITRITE Negative 08/22/2022 0958   LEUKOCYTESUR Negative 08/22/2022 0958   Sepsis Labs Recent Labs  Lab 01/29/24 1934 01/30/24 0631  WBC 13.7* 13.0*   Microbiology Recent Results (from the past 240 hours)  WOUND CULTURE     Status: None   Collection Time: 01/22/24  2:40 PM  Result Value Ref Range Status   MICRO NUMBER: [id number]  Final   SPECIMEN QUALITY: Adequate  Final   SOURCE: WOUND  Final   STATUS: FINAL  Final   GRAM STAIN:   Final    No epithelial cells seen No white blood cells seen Moderate Gram negative bacilli Few Gram positive cocci in pairs   RESULT:   Final    A mix of organisms of questionable significance was recovered on culture and not further identified. (Note: Growth did not detect the presence of S.aureus, beta-hemolytic Streptococci or P.aeruginosa).  Blood culture (routine x 2)     Status: None (Preliminary result)   Collection Time: 01/29/24  7:32 PM   Specimen: BLOOD LEFT ARM  Result Value Ref Range Status   Specimen Description BLOOD LEFT ARM  Final   Special Requests  Final    BOTTLES DRAWN AEROBIC AND ANAEROBIC Blood Culture adequate volume   Culture   Final    NO GROWTH 3 DAYS Performed at [facility], [address] [place], [place] [address]    Report Status PENDING  Incomplete  Blood culture (routine x 2)     Status: None (Preliminary result)   Collection Time: 01/29/24  8:11 PM   Specimen: BLOOD  Result Value Ref Range Status   Specimen Description BLOOD RIGHT ANTECUBITAL  Final   Special Requests   Final    BOTTLES DRAWN AEROBIC AND ANAEROBIC Blood Culture adequate volume   Culture   Final    NO GROWTH 3 DAYS Performed at [facility], [address] [place], [place] [address]    Report Status PENDING  Incomplete     Time coordinating discharge: Over 35 minutes  SIGNED:   Erle Odell Castor, MD  [facility] 02/01/2024, 8:52 AM Pager   If 7PM-7AM,  please contact night-coverage [email or web address] Password [email or web address]

## 2024-02-01 NOTE — Progress Notes (Signed)
 Physical Therapy Treatment Patient Details Name: KUPONO MARLING MRN: [id number] DOB: 16-Dec-1950 Today's Date: 02/01/2024   History of Present Illness Daniel [name] is a 73 y.o. male admitted 01/29/24 for osteomyelitis of the left third toe. 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 PMHx: T2DM, peripheral neuropathy, essential HTN, CKD stage IIIa, prolactinoma, anxiety, and depression.    PT Comments  Pt resting in bed on arrival, pleasant and agreeable to session with continued progress towards acute goals. Pt able to progress stair training this session with grossly CGA for safety with step-to pattern and no overt LOB noted. Gait distance limited for stair training and pt without post op shoe in room and pt continuing to opt for heel weight bearing vs NWB. Pt demonstrating bed mobility, transfers and short gait with grossly supervision for safety. Pt was educated on continued walker use to maximize functional independence, safety, and decrease risk for falls as well as post op shoe wear and appropriate activity progression with pt verbalizing understanding. Pt continues to benefit from skilled PT services to progress toward functional mobility goals.      If plan is discharge home, recommend the following: A little help with walking and/or transfers;Help with stairs or ramp for entrance;Assist for transportation   Can travel by private vehicle        Equipment Recommendations  Rolling walker (2 wheels)    Recommendations for Other Services       Precautions / Restrictions Precautions Precautions: Fall Recall of Precautions/Restrictions: Intact Precaution/Restrictions Comments: Pt did not have his post-op shoe present so instructed him to keep LLE NWB, but he often allowed heel weight-bearing. Required Braces or Orthoses: Other Brace Other Brace: Post Op Shoe on LLE (pt's wife is bringing his from home, not currently in room.) Restrictions Weight Bearing Restrictions Per Provider Order: Yes LLE Weight  Bearing Per Provider Order: Weight bearing as tolerated     Mobility  Bed Mobility Overal bed mobility: Needs Assistance Bed Mobility: Supine to Sit     Supine to sit: HOB elevated, Used rails, Supervision     General bed mobility comments: Pt sat up on R side of bed with increased time.    Transfers Overall transfer level: Needs assistance Equipment used: Rolling walker (2 wheels) Transfers: Sit to/from Stand, Bed to chair/wheelchair/BSC Sit to Stand: Contact guard assist           General transfer comment: Pt stood from lowest bed height and chair. He demonstrated proper hand placement using RW.    Ambulation/Gait Ambulation/Gait assistance: Contact guard assist Gait Distance (Feet): 15 Feet (x2) Assistive device: Rolling walker (2 wheels) Gait Pattern/deviations: Step-to pattern, Decreased step length - right, Decreased stance time - left, Decreased weight shift to left, Trunk flexed Gait velocity: decreased     General Gait Details: Pt ambulated with small slow steps. distance limited for focus on stair training   Stairs Stairs: Yes Stairs assistance: Contact guard assist Stair Management: One rail Right, Step to pattern, Forwards Number of Stairs: 6 General stair comments: pt ascending with RLE step to pattern with R rail support without LOB, descending with LLE with step to pattern and L rail support, demosntrated side ways approach with pt opting for forwards, no LOB   Wheelchair Mobility     Tilt Bed    Modified Rankin (Stroke Patients Only)       Balance Overall balance assessment: Mild deficits observed, not formally tested  Communication Communication Communication: No apparent difficulties  Cognition Arousal: Alert Behavior During Therapy: WFL for tasks assessed/performed   PT - Cognitive impairments: No apparent impairments                       PT - Cognition  Comments: Pt A,Ox4 Following commands: Intact      Cueing Cueing Techniques: Verbal cues  Exercises      General Comments        Pertinent Vitals/Pain Pain Assessment Pain Location: L foot Pain Descriptors / Indicators: Discomfort, Grimacing, Guarding, Aching    Home Living                          Prior Function            PT Goals (current goals can now be found in the care plan section) Acute Rehab PT Goals Patient Stated Goal: Return Home PT Goal Formulation: With patient Time For Goal Achievement: 02/14/24 Progress towards PT goals: Progressing toward goals    Frequency    Min 2X/week      PT Plan      Co-evaluation              AM-PAC PT 6 Clicks Mobility   Outcome Measure  Help needed turning from your back to your side while in a flat bed without using bedrails?: A Little Help needed moving from lying on your back to sitting on the side of a flat bed without using bedrails?: A Little Help needed moving to and from a bed to a chair (including a wheelchair)?: A Little Help needed standing up from a chair using your arms (e.g., wheelchair or bedside chair)?: A Little Help needed to walk in hospital room?: A Little Help needed climbing 3-5 steps with a railing? : A Lot 6 Click Score: 17    End of Session   Activity Tolerance: Patient tolerated treatment well Patient left: in chair;with call bell/phone within reach Nurse Communication: Mobility status PT Visit Diagnosis: Difficulty in walking, not elsewhere classified (R26.2);Unsteadiness on feet (R26.81);Other abnormalities of gait and mobility (R26.89)     Time: 9063-9045 PT Time Calculation (min) (ACUTE ONLY): 18 min  Charges:    $Gait Training: 8-22 mins PT General Charges $$ ACUTE PT VISIT: 1 Visit                     Natacha Jepsen R. PTA Acute Rehabilitation Services Office: [phone]   Therisa CHRISTELLA Boor 02/01/2024, 10:47 AM

## 2024-02-03 DIAGNOSIS — E1169 Type 2 diabetes mellitus with other specified complication: Secondary | ICD-10-CM | POA: Diagnosis not present

## 2024-02-03 DIAGNOSIS — I152 Hypertension secondary to endocrine disorders: Secondary | ICD-10-CM | POA: Diagnosis not present

## 2024-02-03 DIAGNOSIS — Z4781 Encounter for orthopedic aftercare following surgical amputation: Secondary | ICD-10-CM | POA: Diagnosis not present

## 2024-02-03 DIAGNOSIS — E1165 Type 2 diabetes mellitus with hyperglycemia: Secondary | ICD-10-CM | POA: Diagnosis not present

## 2024-02-03 DIAGNOSIS — Z9181 History of falling: Secondary | ICD-10-CM | POA: Diagnosis not present

## 2024-02-03 DIAGNOSIS — F32A Depression, unspecified: Secondary | ICD-10-CM | POA: Diagnosis not present

## 2024-02-03 DIAGNOSIS — E785 Hyperlipidemia, unspecified: Secondary | ICD-10-CM | POA: Diagnosis not present

## 2024-02-03 DIAGNOSIS — E1159 Type 2 diabetes mellitus with other circulatory complications: Secondary | ICD-10-CM | POA: Diagnosis not present

## 2024-02-03 DIAGNOSIS — Z7982 Long term (current) use of aspirin: Secondary | ICD-10-CM | POA: Diagnosis not present

## 2024-02-03 DIAGNOSIS — Z89422 Acquired absence of other left toe(s): Secondary | ICD-10-CM | POA: Diagnosis not present

## 2024-02-03 DIAGNOSIS — N1831 Chronic kidney disease, stage 3a: Secondary | ICD-10-CM | POA: Diagnosis not present

## 2024-02-03 DIAGNOSIS — Z6836 Body mass index (BMI) 36.0-36.9, adult: Secondary | ICD-10-CM | POA: Diagnosis not present

## 2024-02-03 DIAGNOSIS — Z86018 Personal history of other benign neoplasm: Secondary | ICD-10-CM | POA: Diagnosis not present

## 2024-02-03 DIAGNOSIS — E1142 Type 2 diabetes mellitus with diabetic polyneuropathy: Secondary | ICD-10-CM | POA: Diagnosis not present

## 2024-02-03 DIAGNOSIS — E038 Other specified hypothyroidism: Secondary | ICD-10-CM | POA: Diagnosis not present

## 2024-02-03 DIAGNOSIS — F419 Anxiety disorder, unspecified: Secondary | ICD-10-CM | POA: Diagnosis not present

## 2024-02-03 DIAGNOSIS — Z794 Long term (current) use of insulin: Secondary | ICD-10-CM | POA: Diagnosis not present

## 2024-02-03 DIAGNOSIS — Z792 Long term (current) use of antibiotics: Secondary | ICD-10-CM | POA: Diagnosis not present

## 2024-02-03 DIAGNOSIS — E1122 Type 2 diabetes mellitus with diabetic chronic kidney disease: Secondary | ICD-10-CM | POA: Diagnosis not present

## 2024-02-03 DIAGNOSIS — M86172 Other acute osteomyelitis, left ankle and foot: Secondary | ICD-10-CM | POA: Diagnosis not present

## 2024-02-03 LAB — CULTURE, BLOOD (ROUTINE X 2)
Culture: NO GROWTH
Culture: NO GROWTH
Special Requests: ADEQUATE
Special Requests: ADEQUATE

## 2024-02-03 NOTE — Progress Notes (Unsigned)
 Office Note     CC:  Left foot osteomyelitis  Requesting Provider:  Joya Stabs, DPM  HPI: Daniel [name] is a 73 y.o. (29-Jun-1951) male presenting at the request of Dr. Stabs Joya for left foot osteomyelitis now status post 7/31 left third toe amputation.  On exam today, Daniel [name] was doing well.  He states his postsurgical bandages are supposed to be removed Thursday by Dr. Janit. He was initially admitted for worsening infection of the left third toe, and failed outpatient antibiotics.  The toe was removed due to osteomyelitis. Denies history of claudication, ischemic rest pain.   Medical history includes diabetes mellitus, chronic kidney disease. He is a non-smoker.  Past Medical History:  Diagnosis Date   Allergic rhinitis    Allergy    Anxiety    Arthritis    Benign hypertension    Cataract    Depression    Diabetes mellitus (HCC)    type 2    GERD (gastroesophageal reflux disease)    History of pneumonia    HLD (hyperlipidemia)    Neuromuscular disorder (HCC)    pt denies at 8/19/visit    Pneumonia    hx of several times per pt - last episode 4-5 years ago     Past Surgical History:  Procedure Laterality Date   AMPUTATION TOE Left 01/30/2024   Procedure: AMPUTATION, TOE;  Surgeon: Janit Thresa HERO, DPM;  Location: [facility];  Service: Orthopedics/Podiatry;  Laterality: Left;  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   CARPAL TUNNEL RELEASE Bilateral 2020 Right, 2021 left   CATARACT EXTRACTION     bilateral   EYE SURGERY     KNEE SURGERY     right   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   Procedure: LEFT TOTAL KNEE ARTHROPLASTY;  Surgeon: Vernetta Lonni GRADE, MD;  Location: [facility];  Service: Orthopedics;  Laterality: Left;    Social History   Socioeconomic History   Marital status: Married    Spouse name: Not on file   Number of children: 3   Years of education: 16   Highest education level: Bachelor's degree (e.g., BA, AB, BS)  Occupational History   Occupation:  Photographer, Optician, dispensing  Tobacco Use   Smoking status: Never   Smokeless tobacco: Never  Vaping Use   Vaping status: Never Used  Substance and Sexual Activity   Alcohol use: No   Drug use: No   Sexual activity: Not Currently  Other Topics Concern   Not on file  Social History Narrative   Caffiene decaff tea and coffee, occ diet soda.   Social Drivers of Corporate investment banker Strain: Low Risk  (01/27/2024)   Overall Financial Resource Strain (CARDIA)    Difficulty of Paying Living Expenses: Not hard at all  Food Insecurity: No Food Insecurity (01/30/2024)   Hunger Vital Sign    Worried About Running Out of Food in the Last Year: Never true    Ran Out of Food in the Last Year: Never true  Transportation Needs: No Transportation Needs (01/30/2024)   PRAPARE - Administrator, Civil Service (Medical): No    Lack of Transportation (Non-Medical): No  Physical Activity: Insufficiently Active (01/27/2024)   Exercise Vital Sign    Days of Exercise per Week: 2 days    Minutes of Exercise per Session: 20 min  Stress: No Stress Concern Present (01/27/2024)   [employer] of Occupational Health - Occupational Stress Questionnaire    Feeling of Stress:  Only a little  Social Connections: Socially Integrated (01/30/2024)   Social Connection and Isolation Panel    Frequency of Communication with Friends and Family: More than three times a week    Frequency of Social Gatherings with Friends and Family: Twice a week    Attends Religious Services: More than 4 times per year    Active Member of [employer] or Organizations: Yes    Attends Engineer, structural: More than 4 times per year    Marital Status: Married  Catering manager Violence: Not At Risk (01/30/2024)   Humiliation, Afraid, Rape, and Kick questionnaire    Fear of Current or Ex-Partner: No    Emotionally Abused: No    Physically Abused: No    Sexually Abused: No   Family History  Problem Relation Age of  Onset   Diabetes Father    Colon polyps Father    Atrial fibrillation Mother    Prostate cancer Paternal Uncle    Cancer Sister    Esophageal cancer Neg Hx    Rectal cancer Neg Hx    Stomach cancer Neg Hx     Current Outpatient Medications  Medication Sig Dispense Refill   albuterol  (VENTOLIN  HFA) 108 (90 Base) MCG/ACT inhaler INHALE 2 PUFFS BY MOUTH EVERY 6 HOURS AS NEEDED FOR WHEEZING OR SHORTNESS OF BREATH (Patient not taking: Reported on 01/29/2024) 9 g 0   amoxicillin -clavulanate (AUGMENTIN ) 875-125 MG tablet Take 1 tablet by mouth every 12 (twelve) hours for 14 days. 28 tablet 0   aspirin  EC 81 MG tablet Take 81 mg by mouth at bedtime.     cabergoline  (DOSTINEX ) 0.5 MG tablet Take 0.5 mg by mouth 2 (two) times a week.     Cholecalciferol  (VITAMIN D ) 125 MCG (5000 UT) CAPS Take 5,000 Units by mouth daily.     Continuous Glucose Sensor (FREESTYLE LIBRE 3 PLUS SENSOR) MISC CHANGE EVERY 15 DAYS     Cyanocobalamin (VITAMIN B 12 PO) Take 1,000 mcg by mouth daily.     GEMTESA 75 MG TABS Take 75 mg by mouth at bedtime.     glucose blood test strip Check glucose twice daily.     HUMALOG MIX 75/25 KWIKPEN (75-25) 100 UNIT/ML KwikPen 50-80 Units 2 (two) times daily before a meal.     Insulin  Pen Needle (PEN NEEDLES) 32G X 4 MM MISC 1 Application by Does not apply route daily. 100 each 3   Lancets (ONETOUCH DELICA PLUS LANCET33G) MISC USE 1 TO CHECK GLUCOSE ONCE DAILY     levothyroxine  (SYNTHROID ) 75 MCG tablet Take 75 mcg by mouth daily before breakfast.     linezolid  (ZYVOX ) 600 MG tablet Take 1 tablet (600 mg total) by mouth every 12 (twelve) hours for 14 days. 28 tablet 0   lisinopril  (ZESTRIL ) 20 MG tablet Take 1 tablet by mouth once daily 90 tablet 0   montelukast  (SINGULAIR ) 10 MG tablet Take 1 tablet (10 mg total) by mouth at bedtime. 90 tablet 2   Multiple Vitamin (MULTIVITAMIN WITH MINERALS) TABS tablet Take 1 tablet by mouth daily.     ONETOUCH ULTRA test strip USE 1 STRIP TO CHECK  GLUCOSE ONCE DAILY 100 each 1   pregabalin  (LYRICA ) 100 MG capsule Take 100 mg by mouth 2 (two) times daily. One capsule by mouth twice a day     rosuvastatin  (CRESTOR ) 10 MG tablet Take 1 tablet (10 mg total) by mouth daily. 90 tablet 1   sertraline  (ZOLOFT ) 100 MG tablet Take 2  tablets (200 mg total) by mouth daily. 2 tablets daily. (Patient taking differently: Take 200 mg by mouth daily.) 180 tablet 1   Testosterone  1.62 % GEL Apply 1 Application topically daily.     traMADol  (ULTRAM ) 50 MG tablet TAKE 1 TABLET BY MOUTH EVERY 6 HOURS AS NEEDED 120 tablet 0   No current facility-administered medications for this visit.    Allergies  Allergen Reactions   Codeine Palpitations, Shortness Of Breath and Other (See Comments)   Pioglitazone Other (See Comments), Shortness Of Breath and Swelling    Blood sugar went up and down and sweating profusely     REVIEW OF SYSTEMS:  [X]  denotes positive finding, [ ]  denotes negative finding Cardiac  Comments:  Chest pain or chest pressure:    Shortness of breath upon exertion:    Short of breath when lying flat:    Irregular heart rhythm:        Vascular    Pain in calf, thigh, or hip brought on by ambulation:    Pain in feet at night that wakes you up from your sleep:     Blood clot in your veins:    Leg swelling:         Pulmonary    Oxygen at home:    Productive cough:     Wheezing:         Neurologic    Sudden weakness in arms or legs:     Sudden numbness in arms or legs:     Sudden onset of difficulty speaking or slurred speech:    Temporary loss of vision in one eye:     Problems with dizziness:         Gastrointestinal    Blood in stool:     Vomited blood:         Genitourinary    Burning when urinating:     Blood in urine:        Psychiatric    Major depression:         Hematologic    Bleeding problems:    Problems with blood clotting too easily:        Skin    Rashes or ulcers:        Constitutional    Fever or  chills:      PHYSICAL EXAMINATION:  There were no vitals filed for this visit.  General:  WDWN in NAD; vital signs documented above Gait: Not observed HENT: WNL, normocephalic Pulmonary: normal non-labored breathing , without wheezing Cardiac: regular HR Abdomen: soft, NT, no masses Skin: without rashes Vascular Exam/Pulses:  Right Left  Radial 2+ (normal) 2+ (normal)  Ulnar    Femoral 2+ (normal) 2+ (normal)  Popliteal    DP 2+ (normal) Unable to assess due to bandage  PT  Unable to assess due to bandage   Extremities: Left foot bandaged.  With plans for removal Thursday.  Patient did not want this removed today. Musculoskeletal: no muscle wasting or atrophy  Neurologic: A&O X 3;  No focal weakness or paresthesias are detected Psychiatric:  The pt has Normal affect.   Non-Invasive Vascular Imaging:     ABI Findings:  +---------+------------------+-----+---------+--------+  Right   Rt Pressure (mmHg)IndexWaveform Comment   +---------+------------------+-----+---------+--------+  Brachial 136                    triphasic          +---------+------------------+-----+---------+--------+  PTA     148  1.09 triphasic          +---------+------------------+-----+---------+--------+  DP      152               1.12 triphasic          +---------+------------------+-----+---------+--------+  Great Toe63                0.46                    +---------+------------------+-----+---------+--------+   +---------+------------------+-----+-----------+-------+  Left    Lt Pressure (mmHg)IndexWaveform   Comment  +---------+------------------+-----+-----------+-------+  Brachial 136                    triphasic           +---------+------------------+-----+-----------+-------+  PTA     144               1.06 triphasic           +---------+------------------+-----+-----------+-------+  DP      130               0.96  multiphasic         +---------+------------------+-----+-----------+-------+  Great Toe87                0.64                     +---------+------------------+-----+-----------+-------+   +-------+-----------+-----------+------------+------------+  ABI/TBIToday's ABIToday's TBIPrevious ABIPrevious TBI  +-------+-----------+-----------+------------+------------+  Right 1.12       0.46                                 +-------+-----------+-----------+------------+------------+  Left  1.06       0.64                                 +-------+-----------+-----------+------------+------------+     ASSESSMENT/PLAN: WEYLIN PLAGGE is a 73 y.o. male presenting s/p 7/31  left third toe amp.  On exam, Daniel [name] was doing well.  The left foot was wrapped.  The dressing will be taken down on Thursday and Dr. Janit office. ABI demonstrates sufficient toe pressure for wound healing.    We discussed the importance of lowering his A1c, as well as eating healthy to promote wound healing.  My plan is to forward this to Dr. Janit.  If there are any wound healing concerns, we would move forward with angiogram due to longstanding diabetes and chronic kidney disease.  At this time, my plan is to follow him on a 61-month basis with ABI as he has bilateral lower extremity peripheral arterial disease.    Fonda FORBES Rim, MD [facility] [phone]

## 2024-02-04 ENCOUNTER — Ambulatory Visit: Attending: Vascular Surgery | Admitting: Vascular Surgery

## 2024-02-04 ENCOUNTER — Encounter: Payer: Self-pay | Admitting: Vascular Surgery

## 2024-02-04 VITALS — BP 157/70 | HR 65 | Temp 98.2°F | Resp 20 | Ht 68.0 in | Wt 246.5 lb

## 2024-02-04 DIAGNOSIS — Z89429 Acquired absence of other toe(s), unspecified side: Secondary | ICD-10-CM | POA: Diagnosis not present

## 2024-02-05 ENCOUNTER — Encounter: Payer: Self-pay | Admitting: Podiatry

## 2024-02-05 ENCOUNTER — Ambulatory Visit: Admitting: Podiatry

## 2024-02-05 VITALS — BP 155/62 | HR 64 | Temp 98.8°F

## 2024-02-05 DIAGNOSIS — E1142 Type 2 diabetes mellitus with diabetic polyneuropathy: Secondary | ICD-10-CM

## 2024-02-05 DIAGNOSIS — Z89422 Acquired absence of other left toe(s): Secondary | ICD-10-CM | POA: Diagnosis not present

## 2024-02-05 NOTE — Progress Notes (Signed)
 Subjective:  Patient ID: Daniel [name], male    DOB: October 01, 1950,  MRN: [id number]  Chief Complaint  Patient presents with   Routine Post Op    POV#1   DOS 01/30/2024 AMPUTATION, TOE 3RD LEFT It's great, no pain there.  I haven't had any nausea or vomiting but I have had diarrhea.  I think that is from the antibiotic.    DOS: 01/30/24  Procedure: Left third digit amputation  73 y.o. male returns for POV#1. Doing well no issues. Has been taking an antibioitcs and causing some diarrhea.   Review of Systems: Negative except as noted in the HPI. Denies N/V/F/Ch.  Past Medical History:  Diagnosis Date   Allergic rhinitis    Allergy    Anxiety    Arthritis    Benign hypertension    Cataract    Depression    Diabetes mellitus (HCC)    type 2    GERD (gastroesophageal reflux disease)    History of pneumonia    HLD (hyperlipidemia)    Neuromuscular disorder (HCC)    pt denies at 8/19/visit    Peripheral vascular disease (HCC)    Pneumonia    hx of several times per pt - last episode 4-5 years ago     Current Outpatient Medications:    albuterol  (VENTOLIN  HFA) 108 (90 Base) MCG/ACT inhaler, INHALE 2 PUFFS BY MOUTH EVERY 6 HOURS AS NEEDED FOR WHEEZING OR SHORTNESS OF BREATH, Disp: 9 g, Rfl: 0   amoxicillin -clavulanate (AUGMENTIN ) 875-125 MG tablet, Take 1 tablet by mouth every 12 (twelve) hours for 14 days., Disp: 28 tablet, Rfl: 0   aspirin  EC 81 MG tablet, Take 81 mg by mouth at bedtime., Disp: , Rfl:    cabergoline  (DOSTINEX ) 0.5 MG tablet, Take 0.5 mg by mouth 2 (two) times a week., Disp: , Rfl:    Cholecalciferol  (VITAMIN D ) 125 MCG (5000 UT) CAPS, Take 5,000 Units by mouth daily., Disp: , Rfl:    Continuous Glucose Sensor (FREESTYLE LIBRE 3 PLUS SENSOR) MISC, CHANGE EVERY 15 DAYS, Disp: , Rfl:    Cyanocobalamin (VITAMIN B 12 PO), Take 1,000 mcg by mouth daily., Disp: , Rfl:    GEMTESA 75 MG TABS, Take 75 mg by mouth at bedtime., Disp: , Rfl:    glucose blood test strip,  Check glucose twice daily., Disp: , Rfl:    HUMALOG MIX 75/25 KWIKPEN (75-25) 100 UNIT/ML KwikPen, 50-80 Units 2 (two) times daily before a meal., Disp: , Rfl:    Insulin  Pen Needle (PEN NEEDLES) 32G X 4 MM MISC, 1 Application by Does not apply route daily., Disp: 100 each, Rfl: 3   Lancets (ONETOUCH DELICA PLUS LANCET33G) MISC, USE 1 TO CHECK GLUCOSE ONCE DAILY, Disp: , Rfl:    levothyroxine  (SYNTHROID ) 75 MCG tablet, Take 75 mcg by mouth daily before breakfast., Disp: , Rfl:    linezolid  (ZYVOX ) 600 MG tablet, Take 1 tablet (600 mg total) by mouth every 12 (twelve) hours for 14 days., Disp: 28 tablet, Rfl: 0   lisinopril  (ZESTRIL ) 20 MG tablet, Take 1 tablet by mouth once daily, Disp: 90 tablet, Rfl: 0   montelukast  (SINGULAIR ) 10 MG tablet, Take 1 tablet (10 mg total) by mouth at bedtime., Disp: 90 tablet, Rfl: 2   Multiple Vitamin (MULTIVITAMIN WITH MINERALS) TABS tablet, Take 1 tablet by mouth daily., Disp: , Rfl:    ONETOUCH ULTRA test strip, USE 1 STRIP TO CHECK GLUCOSE ONCE DAILY, Disp: 100 each, Rfl: 1   pregabalin  (  LYRICA ) 100 MG capsule, Take 100 mg by mouth 2 (two) times daily. One capsule by mouth twice a day, Disp: , Rfl:    rosuvastatin  (CRESTOR ) 10 MG tablet, Take 1 tablet (10 mg total) by mouth daily., Disp: 90 tablet, Rfl: 1   sertraline  (ZOLOFT ) 100 MG tablet, Take 2 tablets (200 mg total) by mouth daily. 2 tablets daily. (Patient taking differently: Take 200 mg by mouth daily.), Disp: 180 tablet, Rfl: 1   Testosterone  1.62 % GEL, Apply 1 Application topically daily., Disp: , Rfl:    traMADol  (ULTRAM ) 50 MG tablet, TAKE 1 TABLET BY MOUTH EVERY 6 HOURS AS NEEDED, Disp: 120 tablet, Rfl: 0  Social History   Tobacco Use  Smoking Status Never  Smokeless Tobacco Never    Allergies  Allergen Reactions   Codeine Palpitations, Shortness Of Breath and Other (See Comments)   Pioglitazone Other (See Comments), Shortness Of Breath and Swelling    Blood sugar went up and down and  sweating profusely   Objective:   Vitals:   02/05/24 0845  BP: (!) 155/62  Pulse: 64  Temp: 98.8 F (37.1 C)   There is no height or weight on file to calculate BMI. Constitutional Well developed. Well nourished.  Vascular Foot warm and well perfused. Capillary refill normal to all digits.   Neurologic Normal speech. Oriented to person, place, and time. Epicritic sensation to light touch grossly present bilaterally.  Dermatologic Skin healing well without signs of infection. Skin edges well coapted without signs of infection.  Orthopedic: Tenderness to palpation noted about the surgical site.   Radiographs: Interval resection of third digit on left  Assessment:   1. Status post amputation of lesser toe of left foot (HCC)   2. Diabetic peripheral neuropathy (HCC)    Plan:  Patient was evaluated and treated and all questions answered.  S/p foot surgery left -Progressing as expected post-operatively. -WB Status: WBAT in surgical shoe -Sutures: intact. -Medications: n/a -Foot redressed.  Return in 2 weeks for suture removal.   No follow-ups on file.

## 2024-02-10 ENCOUNTER — Ambulatory Visit: Admitting: Gastroenterology

## 2024-02-10 ENCOUNTER — Other Ambulatory Visit: Payer: Self-pay

## 2024-02-10 ENCOUNTER — Encounter: Payer: Self-pay | Admitting: Gastroenterology

## 2024-02-10 VITALS — BP 132/60 | HR 85 | Ht 65.35 in | Wt 244.1 lb

## 2024-02-10 DIAGNOSIS — K219 Gastro-esophageal reflux disease without esophagitis: Secondary | ICD-10-CM

## 2024-02-10 DIAGNOSIS — K5900 Constipation, unspecified: Secondary | ICD-10-CM | POA: Diagnosis not present

## 2024-02-10 DIAGNOSIS — Z860100 Personal history of colon polyps, unspecified: Secondary | ICD-10-CM | POA: Diagnosis not present

## 2024-02-10 MED ORDER — POLYETHYLENE GLYCOL 3350 17 G PO PACK: 17.0000 g | PACK | Freq: Two times a day (BID) | ORAL | Status: AC | PRN

## 2024-02-10 MED ORDER — LINACLOTIDE 145 MCG PO CAPS: 145.0000 ug | ORAL_CAPSULE | Freq: Every day | ORAL | 0 refills | Status: DC

## 2024-02-10 NOTE — Patient Instructions (Signed)
 You can increase your Miralax  to twice a day as tolerated.  We have given you samples of the following medication to take: Linzess  145 mcg: Take 30 minutes before a meal  Linzess  works best when taken once a day every day, on an empty stomach, at least 30 minutes before your first meal of the day.  When Linzess  is taken daily as directed:  *Constipation relief is typically felt in about a week *IBS-C patients may begin to experience relief from belly pain and overall abdominal symptoms (pain, discomfort, and bloating) in about 1 week,   with symptoms typically improving over 12 weeks.  Diarrhea may occur in the first 2 weeks -keep taking it.  The diarrhea should go away and you should start having normal, complete, full bowel movements. It may be helpful to start treatment when you can be near the comfort of your own bathroom, such as a weekend.   Thank you for entrusting me with your care and for choosing [facility], Dr. Elspeth Naval    _______________________________________________________  If your blood pressure at your visit was 140/90 or greater, please contact your primary care physician to follow up on this.  _______________________________________________________  If you are age 68 or older, your body mass index should be between 23-30. Your Body mass index is 40.19 kg/m. If this is out of the aforementioned range listed, please consider follow up with your Primary Care Provider.  If you are age 3 or younger, your body mass index should be between 19-25. Your Body mass index is 40.19 kg/m. If this is out of the aformentioned range listed, please consider follow up with your Primary Care Provider.   ________________________________________________________  The [facility] providers would like to encourage you to use MYCHART to communicate with providers for non-urgent requests or questions.  Due to long hold times on the telephone, sending your provider a  message by [facility] may be a faster and more efficient way to get a response.  Please allow 48 business hours for a response.  Please remember that this is for non-urgent requests.  _______________________________________________________  [facility] is using a team-based approach to care.  Your team is made up of your doctor and two to three APPS. Our APPS (Nurse Practitioners and Physician Assistants) work with your physician to ensure care continuity for you. They are fully qualified to address your health concerns and develop a treatment plan. They communicate directly with your gastroenterologist to care for you. Seeing the Advanced Practice Practitioners on your physician's team can help you by facilitating care more promptly, often allowing for earlier appointments, access to diagnostic testing, procedures, and other specialty referrals.

## 2024-02-10 NOTE — Progress Notes (Signed)
 HPI :  73 year old male here for follow-up visit for constipation and GERD, history of colon polyps.  Last seen in 2023  Main issue that has been bothering him since I have last seen him has been constipation.  He takes tramadol  for chronic pain in his knee (pending knee replacement), states this has typically led to constipation over time.  Historically has used MiraLAX  once daily as well as Colace but he does not think it is strong enough and often has persistent symptoms despite this.  Denies any blood in his stools.  His colonoscopy in 2023 showed no high risk lesions.  Symptoms chronic.  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  He has been on Augmentin  and linezolid  for the past week and a half, he states this has definitely made his bowel is loose and constipation currently not a problem while on Augmentin .  He is due to stop that at the end of the week and worried what he can use moving forward to treat his bowels.  He has not tried any prescription laxatives in the past, he inquired specifically about Linzess  today  Recall he also has had a history of GERD in the past, taking omeprazole  20 mg as needed.  He had an EGD with me at her last visit in 2023, no evidence of Barrett's.  He has not been taking omeprazole  daily and using just as needed for breakthrough symptoms.  He states he does not often need to take it too much and very happy with how he is doing in regards to his reflux.  Otherwise denies complaints today.    Prior workup: Colonoscopy 10/11/2016 - The perianal and digital rectal examinations were normal. - A 8 mm polyp was found in the ascending colon. The polyp was sessile. The polyp was removed with a cold snare. Resection and retrieval were complete. - A 4 mm polyp was found in the ascending colon. The polyp was sessile. The polyp was removed with a cold snare. Resection and retrieval were  complete. - Two sessile polyps were found in the transverse colon. The polyps were 3 to 6 mm in size. These polyps were removed with a cold snare. Resection and retrieval were complete. - A 5 mm polyp was found in the recto-sigmoid colon. The polyp was sessile. The polyp was removed with a cold snare. Resection and retrieval were complete. - A few small-mouthed diverticula were found in the left colon and right colon. - Internal hemorrhoids were found during retroflexion. The hemorrhoids were small. - The colon was spastic which prolonged the exam. The exam was otherwise without abnormality.   Surgical [P], ascending, transverse, rectal sigmoid, polyp (5) - TUBULAR ADENOMA (FIVE FRAGMENTS), HYPERPLASTIC POLYP (ONE FRAGMENT) AND BENIGN LYMPHOID POLYPS (FIVE FRAGMENTS). NO HIGH GRADE DYSPLASIA OR MALIGNANCY IDENTIFIED.     EGD 11/21/21: - Esophagogastric landmarks were identified: the Z-line was found at 39 cm, the gastroesophageal junction was found at 39 cm and the upper extent of the gastric folds was found at 39 cm from the incisors. Findings: - The exam of the esophagus was otherwise normal. No Barrett's. - The entire examined stomach was normal. - The duodenal bulb and second portion of the duodenum were normal.    Colonoscopy 11/21/21: - The perianal and digital rectal examinations were normal. - A 3 mm polyp was found in the transverse colon. The polyp was sessile. The polyp  was removed with a cold snare. Resection and retrieval were complete. - A 2 mm polyp was found in the descending colon. The polyp was sessile. The polyp was removed with a cold snare. Resection and retrieval were complete. - Multiple small-mouthed diverticula were found in the sigmoid colon. - The exam was otherwise without abnormality  Surgical [P], colon, descending and transverse, polyp (2) - TUBULAR ADENOMA (2 OF 2 FRAGMENTS) - NO HIGH-GRADE DYSPLASIA OR MALIGNANCY IDENTIFIED   Past Medical History:   Diagnosis Date   Allergic rhinitis    Allergy    Anxiety    Arthritis    Benign hypertension    Cataract    Depression    Diabetes mellitus (HCC)    type 2    GERD (gastroesophageal reflux disease)    History of pneumonia    HLD (hyperlipidemia)    Neuromuscular disorder (HCC)    pt denies at 8/19/visit    Peripheral vascular disease (HCC)    Pneumonia    hx of several times per pt - last episode 4-5 years ago      Past Surgical History:  Procedure Laterality Date   AMPUTATION TOE Left 01/30/2024   Procedure: AMPUTATION, TOE;  Surgeon: Janit Thresa HERO, DPM;  Location: [facility] OR;  Service: Orthopedics/Podiatry;  Laterality: Left;  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   CARPAL TUNNEL RELEASE Bilateral 2020 Right, 2021 left   CATARACT EXTRACTION     bilateral   EYE SURGERY     KNEE SURGERY     right   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   Procedure: LEFT TOTAL KNEE ARTHROPLASTY;  Surgeon: Vernetta Lonni GRADE, MD;  Location: [facility];  Service: Orthopedics;  Laterality: Left;   Family History  Problem Relation Age of Onset   Diabetes Father    Colon polyps Father    Atrial fibrillation Mother    Prostate cancer Paternal Uncle    Cancer Sister    Esophageal cancer Neg Hx    Rectal cancer Neg Hx    Stomach cancer Neg Hx    Social History   Tobacco Use   Smoking status: Never   Smokeless tobacco: Never  Vaping Use   Vaping status: Never Used  Substance Use Topics   Alcohol use: No   Drug use: No   Current Outpatient Medications  Medication Sig Dispense Refill   albuterol  (VENTOLIN  HFA) 108 (90 Base) MCG/ACT inhaler INHALE 2 PUFFS BY MOUTH EVERY 6 HOURS AS NEEDED FOR WHEEZING OR SHORTNESS OF BREATH 9 g 0   amoxicillin -clavulanate (AUGMENTIN ) 875-125 MG tablet Take 1 tablet by mouth every 12 (twelve) hours for 14 days. 28 tablet 0   aspirin  EC 81 MG tablet Take 81 mg by mouth at bedtime.     cabergoline  (DOSTINEX ) 0.5 MG tablet Take 0.5 mg by mouth 2 (two) times a week.      Cholecalciferol  (VITAMIN D ) 125 MCG (5000 UT) CAPS Take 5,000 Units by mouth daily.     Continuous Glucose Sensor (FREESTYLE LIBRE 3 PLUS SENSOR) MISC CHANGE EVERY 15 DAYS     Cyanocobalamin (VITAMIN B 12 PO) Take 1,000 mcg by mouth daily.     GEMTESA 75 MG TABS Take 75 mg by mouth at bedtime.     glucose blood test strip Check glucose twice daily.     HUMALOG MIX 75/25 KWIKPEN (75-25) 100 UNIT/ML KwikPen 50-80 Units 2 (two) times daily before a meal.     Insulin  Pen Needle (PEN NEEDLES) 32G X 4  MM MISC 1 Application by Does not apply route daily. 100 each 3   Lancets (ONETOUCH DELICA PLUS LANCET33G) MISC USE 1 TO CHECK GLUCOSE ONCE DAILY     levothyroxine  (SYNTHROID ) 75 MCG tablet Take 75 mcg by mouth daily before breakfast.     linezolid  (ZYVOX ) 600 MG tablet Take 1 tablet (600 mg total) by mouth every 12 (twelve) hours for 14 days. 28 tablet 0   lisinopril  (ZESTRIL ) 20 MG tablet Take 1 tablet by mouth once daily 90 tablet 0   montelukast  (SINGULAIR ) 10 MG tablet Take 1 tablet (10 mg total) by mouth at bedtime. 90 tablet 2   ONETOUCH ULTRA test strip USE 1 STRIP TO CHECK GLUCOSE ONCE DAILY 100 each 1   pregabalin  (LYRICA ) 100 MG capsule Take 100 mg by mouth 2 (two) times daily. One capsule by mouth twice a day     rosuvastatin  (CRESTOR ) 10 MG tablet Take 1 tablet (10 mg total) by mouth daily. 90 tablet 1   sertraline  (ZOLOFT ) 100 MG tablet Take 2 tablets (200 mg total) by mouth daily. 2 tablets daily. 180 tablet 1   Testosterone  1.62 % GEL Apply 1 Application topically daily.     traMADol  (ULTRAM ) 50 MG tablet TAKE 1 TABLET BY MOUTH EVERY 6 HOURS AS NEEDED 120 tablet 0   Multiple Vitamin (MULTIVITAMIN WITH MINERALS) TABS tablet Take 1 tablet by mouth daily. (Patient not taking: Reported on 02/10/2024)     No current facility-administered medications for this visit.   Allergies  Allergen Reactions   Codeine Palpitations, Shortness Of Breath and Other (See Comments)   Pioglitazone Other  (See Comments), Shortness Of Breath and Swelling    Blood sugar went up and down and sweating profusely     Review of Systems: All systems reviewed and negative except where noted in HPI.    Physical Exam: BP 132/60 (BP Location: Left Arm, Patient Position: Sitting, Cuff Size: Normal)   Pulse 85   Ht 5' 5.35 (1.66 m) Comment: height without shoes  Wt 244 lb 2 oz (110.7 kg)   BMI 40.19 kg/m  Constitutional: Pleasant,well-developed, male in no acute distress. Neurological: Alert and oriented to person place and time. Psychiatric: Normal mood and affect. Behavior is normal.   ASSESSMENT: 74 y.o. male here for assessment of the following  1. Constipation, unspecified constipation type   2. History of colon polyps   3. Gastroesophageal reflux disease, unspecified whether esophagitis present    Chronic constipation, has clinically failed daily MiraLAX  and Colace.  He is looking for something stronger but concerned MiraLAX  may make the stools too loose at twice daily dosing.  This colonoscopy is up-to-date without any high risk lesions.  Discussed options.  On tramadol  which I think could be related to his constipation.  He inquires about Linzess  which I think is reasonable option, discussed dosing options, will try samples dose at 145 mcg/day and he can use that for a few weeks and see how things go.  If he is happy with this then we can give him a prescription for it.  If it is too strong we may trial 72 mcg/day.  Alternatively if it is not strong enough he can take 290 mcg/day.  He currently is on antibiotics for history of osteomyelitis which is treating his constipation, due to stop that later this week and suspect his symptoms will recur.  Will hold off on Linzess  until he is done with his antibiotics and symptoms recur.  Otherwise reviewed his  history of colon polyps, consider surveillance colonoscopy 7 years from his last exam, at age 64.  We discussed some people often stop  surveillance at that age and he can see me to discuss it further at that time.  Otherwise reassured him of prior EGD results, no history of Barrett's esophagus.  Has stopped using daily PPI, I do not think it is needed at this time, can continue as needed use for GERD symptoms that bother him.   PLAN: - trial of Linzess  145mcg / day PRN, to start once done with antibiotics, assuming his constipation will recur once these are stopped. Free samples given, can do script if needed - can alternatively titrate miralax  and use BID if he tolerates it - consider colonoscopy at age 9, can discuss in the office if he wants to pursue it - continue omeprazole  PRN, no Barrett's, reassured him  Marcey Naval, MD [facility]

## 2024-02-11 DIAGNOSIS — E113211 Type 2 diabetes mellitus with mild nonproliferative diabetic retinopathy with macular edema, right eye: Secondary | ICD-10-CM | POA: Diagnosis not present

## 2024-02-13 ENCOUNTER — Telehealth: Payer: Self-pay | Admitting: Family Medicine

## 2024-02-13 ENCOUNTER — Other Ambulatory Visit: Payer: Self-pay

## 2024-02-13 NOTE — Progress Notes (Signed)
 Pharmacy Quality Measure Review  This patient is appearing on a report for being at risk of failing the adherence measure for cholesterol (statin) medications this calendar year.   Medication: rosuvastatin  10 mg daily Last fill date: 09/20/2023 for 90 day supply  In December of 2024, patient was transitioned from pravastatin  to rosuvastatin . He states that he had the two prescriptions filled around the same time, but he continued to take the remainder of his pravastatin . So, he has surplus of rosuvastatin . Patient checked his bottle while on the phone, and he reports have ~60 tablets remaining. He did not wish to have his prescription refilled at this time.   Counseled patient on disease-states associated with his medication, including rosuvastatin  for his diabetes and lisinopril  for hypertension and renal protection. Reviewed upcoming appointments, and encouraged patient to continue to take medications as prescribed.

## 2024-02-13 NOTE — Telephone Encounter (Signed)
 Home Health Certification or Plan of Care Tracking  Is this a Certification or Plan of Care? Plan of Care  [facility]: [facility]  Order Number:  [id number]  Has charge sheet been attached? Yes  Where has form been placed:   Labeled & placed in provider bin

## 2024-02-13 NOTE — Telephone Encounter (Signed)
Placed in your sign folder for review

## 2024-02-13 NOTE — Telephone Encounter (Signed)
 Paperwork completed and placed in fax bin at back nurse station

## 2024-02-15 ENCOUNTER — Other Ambulatory Visit: Payer: Self-pay | Admitting: Family Medicine

## 2024-02-15 DIAGNOSIS — F32A Depression, unspecified: Secondary | ICD-10-CM

## 2024-02-19 ENCOUNTER — Encounter: Payer: Self-pay | Admitting: Podiatry

## 2024-02-19 ENCOUNTER — Ambulatory Visit (INDEPENDENT_AMBULATORY_CARE_PROVIDER_SITE_OTHER): Admitting: Podiatry

## 2024-02-19 VITALS — BP 156/61 | Temp 98.2°F

## 2024-02-19 DIAGNOSIS — Z89422 Acquired absence of other left toe(s): Secondary | ICD-10-CM | POA: Diagnosis not present

## 2024-02-19 DIAGNOSIS — E1142 Type 2 diabetes mellitus with diabetic polyneuropathy: Secondary | ICD-10-CM

## 2024-02-19 NOTE — Progress Notes (Signed)
 Subjective:  Patient ID: Daniel [name], male    DOB: 1950-11-03,  MRN: [id number]  Chief Complaint  Patient presents with   Routine Post Op    POV#2  DOS 01/30/2024 AMPUTATION, TOE 3RD LEFT I think it's doing great.  Dr. Joya wrapped it and it didn't hold.  My wife wrapped it and it didn't hold.  So, I wrapped it and it stayed.     DOS: 01/30/24  Procedure: Left third digit amputation  73 y.o. male returns for POV#2. Doing well no issues.   Review of Systems: Negative except as noted in the HPI. Denies N/V/F/Ch.  Past Medical History:  Diagnosis Date   Allergic rhinitis    Allergy    Anxiety    Arthritis    Benign hypertension    Cataract    Depression    Diabetes mellitus (HCC)    type 2    GERD (gastroesophageal reflux disease)    History of pneumonia    HLD (hyperlipidemia)    Neuromuscular disorder (HCC)    pt denies at 8/19/visit    Peripheral vascular disease (HCC)    Pneumonia    hx of several times per pt - last episode 4-5 years ago     Current Outpatient Medications:    albuterol  (VENTOLIN  HFA) 108 (90 Base) MCG/ACT inhaler, INHALE 2 PUFFS BY MOUTH EVERY 6 HOURS AS NEEDED FOR WHEEZING OR SHORTNESS OF BREATH, Disp: 9 g, Rfl: 0   aspirin  EC 81 MG tablet, Take 81 mg by mouth at bedtime., Disp: , Rfl:    cabergoline  (DOSTINEX ) 0.5 MG tablet, Take 0.5 mg by mouth 2 (two) times a week., Disp: , Rfl:    Cholecalciferol  (VITAMIN D ) 125 MCG (5000 UT) CAPS, Take 5,000 Units by mouth daily., Disp: , Rfl:    Continuous Glucose Sensor (FREESTYLE LIBRE 3 PLUS SENSOR) MISC, CHANGE EVERY 15 DAYS, Disp: , Rfl:    Cyanocobalamin (VITAMIN B 12 PO), Take 1,000 mcg by mouth daily., Disp: , Rfl:    GEMTESA 75 MG TABS, Take 75 mg by mouth at bedtime., Disp: , Rfl:    glucose blood test strip, Check glucose twice daily., Disp: , Rfl:    HUMALOG MIX 75/25 KWIKPEN (75-25) 100 UNIT/ML KwikPen, 50-80 Units 2 (two) times daily before a meal., Disp: , Rfl:    Insulin  Pen Needle (PEN  NEEDLES) 32G X 4 MM MISC, 1 Application by Does not apply route daily., Disp: 100 each, Rfl: 3   Lancets (ONETOUCH DELICA PLUS LANCET33G) MISC, USE 1 TO CHECK GLUCOSE ONCE DAILY, Disp: , Rfl:    levothyroxine  (SYNTHROID ) 75 MCG tablet, Take 75 mcg by mouth daily before breakfast., Disp: , Rfl:    linaclotide  (LINZESS ) 145 MCG CAPS capsule, Take 1 capsule (145 mcg total) by mouth daily before breakfast., Disp: 12 capsule, Rfl: 0   lisinopril  (ZESTRIL ) 20 MG tablet, Take 1 tablet by mouth once daily, Disp: 90 tablet, Rfl: 0   montelukast  (SINGULAIR ) 10 MG tablet, Take 1 tablet (10 mg total) by mouth at bedtime., Disp: 90 tablet, Rfl: 2   ONETOUCH ULTRA test strip, USE 1 STRIP TO CHECK GLUCOSE ONCE DAILY, Disp: 100 each, Rfl: 1   polyethylene glycol (MIRALAX ) 17 g packet, Take 17 g by mouth 2 (two) times daily as needed., Disp: , Rfl:    pregabalin  (LYRICA ) 100 MG capsule, Take 100 mg by mouth 2 (two) times daily. One capsule by mouth twice a day, Disp: , Rfl:    rosuvastatin  (  CRESTOR ) 10 MG tablet, Take 1 tablet (10 mg total) by mouth daily., Disp: 90 tablet, Rfl: 1   sertraline  (ZOLOFT ) 100 MG tablet, Take 2 tablets by mouth once daily, Disp: 180 tablet, Rfl: 0   Testosterone  1.62 % GEL, Apply 1 Application topically daily., Disp: , Rfl:    traMADol  (ULTRAM ) 50 MG tablet, TAKE 1 TABLET BY MOUTH EVERY 6 HOURS AS NEEDED, Disp: 120 tablet, Rfl: 0   Multiple Vitamin (MULTIVITAMIN WITH MINERALS) TABS tablet, Take 1 tablet by mouth daily. (Patient not taking: Reported on 02/19/2024), Disp: , Rfl:   Social History   Tobacco Use  Smoking Status Never  Smokeless Tobacco Never    Allergies  Allergen Reactions   Codeine Palpitations, Shortness Of Breath and Other (See Comments)   Pioglitazone Other (See Comments), Shortness Of Breath and Swelling    Blood sugar went up and down and sweating profusely   Objective:   Vitals:   02/19/24 1354  BP: (!) 156/61  Temp: 98.2 F (36.8 C)   There is no  height or weight on file to calculate BMI. Constitutional Well developed. Well nourished.  Vascular Foot warm and well perfused. Capillary refill normal to all digits.   Neurologic Normal speech. Oriented to person, place, and time. Epicritic sensation to light touch grossly present bilaterally.  Dermatologic Skin healing well without signs of infection. Skin edges well coapted without signs of infection.  Orthopedic: Tenderness to palpation noted about the surgical site.   Radiographs: Interval resection of third digit on left  Assessment:   1. Status post amputation of lesser toe of left foot (HCC)   2. Diabetic peripheral neuropathy (HCC)     Plan:  Patient was evaluated and treated and all questions answered.  S/p foot surgery left -Progressing as expected post-operatively. -WB Status: WBAT in surgical shoe may transition to regular shoe WBAT -Sutures: removed today without incident.  -Medications: n/a -Foot redressed.  Return in 3 weeks for recheck.   No follow-ups on file.

## 2024-02-25 ENCOUNTER — Other Ambulatory Visit (HOSPITAL_COMMUNITY): Payer: Self-pay

## 2024-02-25 DIAGNOSIS — H40013 Open angle with borderline findings, low risk, bilateral: Secondary | ICD-10-CM | POA: Diagnosis not present

## 2024-02-25 DIAGNOSIS — E1165 Type 2 diabetes mellitus with hyperglycemia: Secondary | ICD-10-CM | POA: Diagnosis not present

## 2024-02-25 DIAGNOSIS — D352 Benign neoplasm of pituitary gland: Secondary | ICD-10-CM | POA: Diagnosis not present

## 2024-02-28 DIAGNOSIS — L570 Actinic keratosis: Secondary | ICD-10-CM | POA: Diagnosis not present

## 2024-02-28 DIAGNOSIS — D1801 Hemangioma of skin and subcutaneous tissue: Secondary | ICD-10-CM | POA: Diagnosis not present

## 2024-02-28 DIAGNOSIS — D235 Other benign neoplasm of skin of trunk: Secondary | ICD-10-CM | POA: Diagnosis not present

## 2024-02-28 DIAGNOSIS — L821 Other seborrheic keratosis: Secondary | ICD-10-CM | POA: Diagnosis not present

## 2024-02-28 DIAGNOSIS — D2371 Other benign neoplasm of skin of right lower limb, including hip: Secondary | ICD-10-CM | POA: Diagnosis not present

## 2024-02-28 DIAGNOSIS — L738 Other specified follicular disorders: Secondary | ICD-10-CM | POA: Diagnosis not present

## 2024-03-03 DIAGNOSIS — N189 Chronic kidney disease, unspecified: Secondary | ICD-10-CM | POA: Diagnosis not present

## 2024-03-03 DIAGNOSIS — D352 Benign neoplasm of pituitary gland: Secondary | ICD-10-CM | POA: Diagnosis not present

## 2024-03-03 DIAGNOSIS — I10 Essential (primary) hypertension: Secondary | ICD-10-CM | POA: Diagnosis not present

## 2024-03-03 DIAGNOSIS — D649 Anemia, unspecified: Secondary | ICD-10-CM | POA: Diagnosis not present

## 2024-03-03 DIAGNOSIS — E291 Testicular hypofunction: Secondary | ICD-10-CM | POA: Diagnosis not present

## 2024-03-03 DIAGNOSIS — E7800 Pure hypercholesterolemia, unspecified: Secondary | ICD-10-CM | POA: Diagnosis not present

## 2024-03-03 DIAGNOSIS — E1142 Type 2 diabetes mellitus with diabetic polyneuropathy: Secondary | ICD-10-CM | POA: Diagnosis not present

## 2024-03-03 DIAGNOSIS — E1165 Type 2 diabetes mellitus with hyperglycemia: Secondary | ICD-10-CM | POA: Diagnosis not present

## 2024-03-03 DIAGNOSIS — E038 Other specified hypothyroidism: Secondary | ICD-10-CM | POA: Diagnosis not present

## 2024-03-04 ENCOUNTER — Ambulatory Visit (INDEPENDENT_AMBULATORY_CARE_PROVIDER_SITE_OTHER): Admitting: Family Medicine

## 2024-03-04 VITALS — BP 122/60 | HR 62 | Temp 98.0°F | Resp 17 | Ht 65.25 in | Wt 244.0 lb

## 2024-03-04 DIAGNOSIS — I10 Essential (primary) hypertension: Secondary | ICD-10-CM | POA: Diagnosis not present

## 2024-03-04 DIAGNOSIS — E1165 Type 2 diabetes mellitus with hyperglycemia: Secondary | ICD-10-CM | POA: Insufficient documentation

## 2024-03-04 DIAGNOSIS — G8929 Other chronic pain: Secondary | ICD-10-CM

## 2024-03-04 DIAGNOSIS — Z23 Encounter for immunization: Secondary | ICD-10-CM

## 2024-03-04 DIAGNOSIS — E7800 Pure hypercholesterolemia, unspecified: Secondary | ICD-10-CM | POA: Diagnosis not present

## 2024-03-04 DIAGNOSIS — M25561 Pain in right knee: Secondary | ICD-10-CM | POA: Diagnosis not present

## 2024-03-04 DIAGNOSIS — M25562 Pain in left knee: Secondary | ICD-10-CM

## 2024-03-04 DIAGNOSIS — F32A Depression, unspecified: Secondary | ICD-10-CM | POA: Diagnosis not present

## 2024-03-04 LAB — COMPREHENSIVE METABOLIC PANEL WITH GFR
ALT: 15 U/L (ref 0–53)
AST: 19 U/L (ref 0–37)
Albumin: 4.1 g/dL (ref 3.5–5.2)
Alkaline Phosphatase: 62 U/L (ref 39–117)
BUN: 20 mg/dL (ref 6–23)
CO2: 29 meq/L (ref 19–32)
Calcium: 8.9 mg/dL (ref 8.4–10.5)
Chloride: 104 meq/L (ref 96–112)
Creatinine, Ser: 1.48 mg/dL (ref 0.40–1.50)
GFR: 46.09 mL/min — ABNORMAL LOW (ref >60.00)
Glucose, Bld: 142 mg/dL — ABNORMAL HIGH (ref 70–99)
Potassium: 5.3 meq/L — ABNORMAL HIGH (ref 3.5–5.1)
Sodium: 141 meq/L (ref 135–145)
Total Bilirubin: 0.5 mg/dL (ref 0.2–1.2)
Total Protein: 6.8 g/dL (ref 6.0–8.3)

## 2024-03-04 LAB — LIPID PANEL
Cholesterol: 123 mg/dL (ref 0–200)
HDL: 64.5 mg/dL (ref >39.00)
LDL Cholesterol: 42 mg/dL (ref 0–99)
NonHDL: 58.6
Total CHOL/HDL Ratio: 2
Triglycerides: 81 mg/dL (ref 0.0–149.0)
VLDL: 16.2 mg/dL (ref 0.0–40.0)

## 2024-03-04 MED ORDER — TRAMADOL HCL 50 MG PO TABS: 50.0000 mg | ORAL_TABLET | Freq: Four times a day (QID) | ORAL | 0 refills | Status: DC | PRN

## 2024-03-04 MED ORDER — ROSUVASTATIN CALCIUM 10 MG PO TABS: 10.0000 mg | ORAL_TABLET | Freq: Every day | ORAL | 3 refills | Status: AC

## 2024-03-04 MED ORDER — LISINOPRIL 20 MG PO TABS: 20.0000 mg | ORAL_TABLET | Freq: Every day | ORAL | 3 refills | Status: AC

## 2024-03-04 MED ORDER — SERTRALINE HCL 100 MG PO TABS: 200.0000 mg | ORAL_TABLET | Freq: Every day | ORAL | 1 refills | Status: AC

## 2024-03-04 NOTE — Progress Notes (Signed)
 Subjective:  Patient ID: Daniel [name], male    DOB: Dec 01, 1950  Age: 73 y.o. MRN: [id number]  CC:  Chief Complaint  Patient presents with   Medical Management of Chronic Issues    Pt is doing okay, did have amputation of middle toe of the Lt foot 5 weeks ago.  Healing well at this time. No concerns expressed     HPI Daniel [name] presents for   Diabetes: Followed by Endocrine, Dr. Tommas, with history of hypogonadism, insulin -dependent with neuropathy.  Unfortunately had osteomyelitis of third toe of left foot requiring amputation in July.  Podiatry, Dr. Joya, Dr. Janit. Follow-up August 6.  Progressing as expected, weight-bear as tolerated in surgical shoe.  Again seen August 20.  Weight-bear as tolerated in surgical shoe with transition to regular shoe and sutures removed without difficulty at that time.  3-week follow-up. Doing well - healing well.  Appt with Dr. Tommas yesterday - no change in meds. Prolactin much better - near normal.  Had labs - freestyle herlene looked better than A1c - 8.5. working on diet.   Lab Results  Component Value Date   HGBA1C 8.9 (H) 01/30/2024   HGBA1C 9.7 (H) 12/24/2022   HGBA1C 10.2 (H) 09/20/2022   Lab Results  Component Value Date   MICROALBUR 4.2 (H) 11/28/2023   LDLCALC 69 08/06/2023   CREATININE 1.56 (H) 01/30/2024   Chronic bilateral knee pain Prior left knee replacement with deferred right knee surgery.  Followed by Ortho with planned improved A1c prior to right knee surgery.  We have been managing pain with tramadol .   Denies any new side effects.  Controlled substance database reviewed. Tramadol  No. 120 last filled on 01/14/2024, previously 120 on 12/03/2023. Still deferring knee surgery until after Christmas. Knee had been doing better - therapy for toe flared up knee pain. Taking tramadol  2-3 per day on average. 4 per day after toe surgery.  8-9 pills left.  No new side effects with Tramadol .  On linzess  from GI - bowels are  moving better, less constipation.   Hyperlipidemia: Crestor  10 mg daily.no new se's Lab Results  Component Value Date   CHOL 145 08/06/2023   HDL 60.40 08/06/2023   LDLCALC 69 08/06/2023   TRIG 78.0 08/06/2023   CHOLHDL 2 08/06/2023   Lab Results  Component Value Date   ALT 12 01/29/2024   AST 17 01/29/2024   ALKPHOS 64 01/29/2024   BILITOT 0.5 01/29/2024   Hypertension: Lisinopril  20 mg daily without any new side effects. Home readings: BP Readings from Last 3 Encounters:  03/04/24 122/60  02/19/24 (!) 156/61  02/10/24 132/60   Lab Results  Component Value Date   CREATININE 1.56 (H) 01/30/2024   Depression: Sertraline  previously increased to 200 mg daily for worsening symptoms last year.  Thought to have component of hypogonadism contributing at that time as well.  Depression symptoms have improved and stable on current dosing.  Would like to continue same.     03/04/2024    8:47 AM 01/22/2024   10:19 AM 06/19/2023    9:55 AM 03/27/2023    8:31 AM 02/28/2023    8:12 AM  Depression screen PHQ 2/9  Decreased Interest 0 0 0 0 0  Down, Depressed, Hopeless 0 0 0 0 0  PHQ - 2 Score 0 0 0 0 0  Altered sleeping 0  0 0 1  Tired, decreased energy 0  0 1 1  Change in appetite 0  0  1 0  Feeling bad or failure about yourself  0  0 0 0  Trouble concentrating 0  0 0 0  Moving slowly or fidgety/restless 0  0 0 0  Suicidal thoughts 0  0 0 0  PHQ-9 Score 0  0 2 2  Difficult doing work/chores Not difficult at all   Not difficult at all Not difficult at all       History Patient Active Problem List   Diagnosis Date Noted   Hyperglycemia due to type 2 diabetes mellitus (HCC) 03/04/2024   Osteomyelitis of third toe of left foot (HCC) 01/29/2024   Chronic kidney disease, stage 3a (HCC) 01/29/2024   Insulin  dependent type 2 diabetes mellitus (HCC) 01/29/2024   Hyperkalemia 01/23/2024   Diabetic foot infection (HCC) 01/22/2024   Chronic anemia 11/27/2023   Chronic kidney  disease (CKD), dialysis modality undecided 11/27/2023   Diabetic peripheral neuropathy (HCC) 11/27/2023   Hypogonadism in male 11/27/2023   Hypothyroidism, secondary 11/27/2023   Neoplasm of pituitary gland 11/27/2023   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   Uncontrolled type 2 diabetes mellitus with hyperglycemia (HCC) 11/27/2023   Hypertension associated with diabetes (HCC) 11/27/2023   Bilateral hearing loss 08/05/2023   Bilateral impacted cerumen 08/05/2023   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   Trigger finger, left little finger 02/04/2020   Carpal tunnel syndrome, left upper limb 11/24/2019   Unilateral primary osteoarthritis, left knee 11/02/2019   Unilateral primary osteoarthritis, right knee 11/02/2019   Carpal tunnel syndrome, right upper limb 02/18/2018   Anxiety and depression 10/28/2016   Gastroesophageal reflux disease without esophagitis 10/28/2016   Seasonal allergic rhinitis due to pollen 10/28/2016   Primary osteoarthritis of left knee 07/25/2015   Essential hypertension 07/25/2015   Hypercholesteremia 07/25/2015   Pollen allergies 02/10/2014   Allergic rhinitis due to animal (cat) (dog) hair and dander 09/15/2012   Extrinsic asthma 09/13/2011   Proteinuria 11/24/2010   Past Medical History:  Diagnosis Date   Allergic rhinitis    Allergy    Anxiety    Arthritis    Benign hypertension    Cataract    Depression    Diabetes mellitus (HCC)    type 2    GERD (gastroesophageal reflux disease)    History of pneumonia    HLD (hyperlipidemia)    Neuromuscular disorder (HCC)    pt denies at 8/19/visit    Peripheral vascular disease (HCC)    Pneumonia    hx of several times per pt - last episode 4-5 years ago    Past Surgical History:  Procedure Laterality Date   AMPUTATION TOE Left 01/30/2024   Procedure: AMPUTATION, TOE;  Surgeon: Janit Thresa HERO, DPM;  Location: [facility] OR;  Service:  Orthopedics/Podiatry;  Laterality: Left;  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   CARPAL TUNNEL RELEASE Bilateral 2020 Right, 2021 left   CATARACT EXTRACTION     bilateral   EYE SURGERY     KNEE SURGERY     right   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   Procedure: LEFT TOTAL KNEE ARTHROPLASTY;  Surgeon: Vernetta Lonni GRADE, MD;  Location: [facility];  Service: Orthopedics;  Laterality: Left;   Allergies  Allergen Reactions   Codeine Palpitations, Shortness Of Breath and Other (See Comments)   Pioglitazone Other (See Comments), Shortness Of Breath and Swelling    Blood sugar went up and down and sweating profusely   Prior to Admission medications  Medication Sig Start Date End Date Taking? Authorizing Provider  albuterol  (VENTOLIN  HFA) 108 (90 Base) MCG/ACT inhaler INHALE 2 PUFFS BY MOUTH EVERY 6 HOURS AS NEEDED FOR WHEEZING OR SHORTNESS OF BREATH 08/02/23  Yes Job Lukes, PA  aspirin  EC 81 MG tablet Take 81 mg by mouth at bedtime. 10/17/13  Yes [provider]  cabergoline  (DOSTINEX ) 0.5 MG tablet Take 0.5 mg by mouth 2 (two) times a week.   Yes [provider]  Cholecalciferol  (VITAMIN D ) 125 MCG (5000 UT) CAPS Take 5,000 Units by mouth daily.   Yes [provider]  Continuous Glucose Sensor (FREESTYLE LIBRE 3 PLUS SENSOR) MISC CHANGE EVERY 15 DAYS   Yes [provider]  Cyanocobalamin (VITAMIN B 12 PO) Take 1,000 mcg by mouth daily.   Yes [provider]  Difluprednate 0.05 % EMUL Place 1 drop into the right eye 3 (three) times daily. 02/11/24  Yes [provider]  GEMTESA 75 MG TABS Take 75 mg by mouth at bedtime. 02/11/23  Yes [provider]  glucose blood test strip Check glucose twice daily. 10/20/13  Yes [provider]  HUMALOG MIX 75/25 KWIKPEN (75-25) 100 UNIT/ML KwikPen 50-80 Units 2 (two) times daily before a meal. 06/18/23  Yes [provider]  Insulin  Pen Needle (PEN NEEDLES) 32G X 4 MM MISC 1  Application by Does not apply route daily. 06/20/22  Yes Levora Reyes SAUNDERS, MD  Lancets [facility] DELICA PLUS [place]) MISC USE 1 TO CHECK GLUCOSE ONCE DAILY 08/04/18  Yes [provider]  levothyroxine  (SYNTHROID ) 75 MCG tablet Take 75 mcg by mouth daily before breakfast.   Yes [provider]  linaclotide  (LINZESS ) 145 MCG CAPS capsule Take 1 capsule (145 mcg total) by mouth daily before breakfast. 02/10/24 02/04/25 Yes Armbruster, Elspeth SQUIBB, MD  lisinopril  (ZESTRIL ) 20 MG tablet Take 1 tablet by mouth once daily 01/14/24  Yes Levora Reyes SAUNDERS, MD  montelukast  (SINGULAIR ) 10 MG tablet Take 1 tablet (10 mg total) by mouth at bedtime. 06/19/23  Yes Levora Reyes SAUNDERS, MD  [facility] ULTRA test strip USE 1 STRIP TO CHECK GLUCOSE ONCE DAILY 11/25/18  Yes Levora Reyes SAUNDERS, MD  polyethylene glycol (MIRALAX ) 17 g packet Take 17 g by mouth 2 (two) times daily as needed. 02/10/24  Yes Armbruster, Elspeth SQUIBB, MD  pregabalin  (LYRICA ) 100 MG capsule Take 100 mg by mouth 2 (two) times daily. One capsule by mouth twice a day   Yes [provider]  rosuvastatin  (CRESTOR ) 10 MG tablet Take 1 tablet (10 mg total) by mouth daily. 06/24/23  Yes Levora Reyes SAUNDERS, MD  sertraline  (ZOLOFT ) 100 MG tablet Take 2 tablets by mouth once daily 02/17/24  Yes Levora Reyes SAUNDERS, MD  Testosterone  1.62 % GEL Apply 1 Application topically daily. 11/29/22  Yes [provider]  traMADol  (ULTRAM ) 50 MG tablet TAKE 1 TABLET BY MOUTH EVERY 6 HOURS AS NEEDED 01/14/24  Yes Levora Reyes SAUNDERS, MD  Multiple Vitamin (MULTIVITAMIN WITH MINERALS) TABS tablet Take 1 tablet by mouth daily. Patient not taking: Reported on 03/04/2024    [provider]   Social History   Socioeconomic History   Marital status: Married    Spouse name: Not on file   Number of children: 3   Years of education: 16   Highest education level: Bachelor's degree (e.g., BA, AB, BS)  Occupational History   Occupation: Photographer,  minister  Tobacco Use   Smoking status: Never   Smokeless tobacco:  Never  Vaping Use   Vaping status: Never Used  Substance and Sexual Activity   Alcohol use: No   Drug use: No   Sexual activity: Not Currently  Other Topics Concern   Not on file  Social History Narrative   Caffiene decaff tea and coffee, occ diet soda.   Social Drivers of Corporate investment banker Strain: Low Risk  (01/27/2024)   Overall Financial Resource Strain (CARDIA)    Difficulty of Paying Living Expenses: Not hard at all  Food Insecurity: No Food Insecurity (01/30/2024)   Hunger Vital Sign    Worried About Running Out of Food in the Last Year: Never true    Ran Out of Food in the Last Year: Never true  Transportation Needs: No Transportation Needs (01/30/2024)   PRAPARE - Administrator, Civil Service (Medical): No    Lack of Transportation (Non-Medical): No  Physical Activity: Insufficiently Active (01/27/2024)   Exercise Vital Sign    Days of Exercise per Week: 2 days    Minutes of Exercise per Session: 20 min  Stress: No Stress Concern Present (01/27/2024)   [employer] of Occupational Health - Occupational Stress Questionnaire    Feeling of Stress: Only a little  Social Connections: Socially Integrated (01/30/2024)   Social Connection and Isolation Panel    Frequency of Communication with Friends and Family: More than three times a week    Frequency of Social Gatherings with Friends and Family: Twice a week    Attends Religious Services: More than 4 times per year    Active Member of [employer] or Organizations: Yes    Attends Engineer, structural: More than 4 times per year    Marital Status: Married  Catering manager Violence: Not At Risk (01/30/2024)   Humiliation, Afraid, Rape, and Kick questionnaire    Fear of Current or Ex-Partner: No    Emotionally Abused: No    Physically Abused: No    Sexually Abused: No    Review of Systems  Constitutional:  Negative for fatigue  and unexpected weight change.  Eyes:  Negative for visual disturbance.  Respiratory:  Negative for cough, chest tightness and shortness of breath.   Cardiovascular:  Negative for chest pain, palpitations and leg swelling.  Gastrointestinal:  Negative for abdominal pain and blood in stool.  Neurological:  Negative for dizziness, light-headedness and headaches.     Objective:   Vitals:   03/04/24 0845  BP: 122/60  Pulse: 62  Resp: 17  Temp: 98 F (36.7 C)  TempSrc: Temporal  SpO2: 97%  Weight: 244 lb (110.7 kg)  Height: 5' 5.25 (1.657 m)     Physical Exam Vitals reviewed.  Constitutional:      Appearance: He is well-developed.  HENT:     Head: Normocephalic and atraumatic.  Neck:     Vascular: No carotid bruit or JVD.  Cardiovascular:     Rate and Rhythm: Normal rate and regular rhythm.     Heart sounds: Normal heart sounds. No murmur heard. Pulmonary:     Effort: Pulmonary effort is normal.     Breath sounds: Normal breath sounds. No rales.  Musculoskeletal:     Right lower leg: No edema.     Left lower leg: No edema.  Skin:    General: Skin is warm and dry.  Neurological:     Mental Status: He is alert and oriented to person, place, and time.  Psychiatric:  Mood and Affect: Mood normal.        Assessment & Plan:  Daniel [name] is a 73 y.o. male . Chronic pain of both knees - Plan: traMADol  (ULTRAM ) 50 MG tablet  - Potential plan for operative repair as above but deferring for now.  Stable with current use of tramadol  without any new side effects.  Constipation now managed with Linzess .  Potential med risks and side effects have been discussed.  Will continue to prescribe for now in good faith until he is able to proceed with surgery.  Controlled substance database has been reviewed as above  Need for influenza vaccination - Plan: Flu vaccine HIGH DOSE PF(Fluzone Trivalent)  Depression, unspecified depression type - Plan: sertraline  (ZOLOFT ) 100 MG  tablet  -  Stable, tolerating current regimen. Medications refilled.   Hypercholesteremia - Plan: Comprehensive metabolic panel with GFR, Lipid panel, rosuvastatin  (CRESTOR ) 10 MG tablet  - With diabetes followed by endocrinology, healing from recent toe amputation.  Tolerating current med regimen of Crestor , continue same.  Check labs and adjust regimen accordingly.   Essential hypertension - Plan: Comprehensive metabolic panel with GFR, lisinopril  (ZESTRIL ) 20 MG tablet  - Stable with current med regimen, continue same, check labs as above  Meds ordered this encounter  Medications   traMADol  (ULTRAM ) 50 MG tablet    Sig: Take 1 tablet (50 mg total) by mouth every 6 (six) hours as needed.    Dispense:  120 tablet    Refill:  0   lisinopril  (ZESTRIL ) 20 MG tablet    Sig: Take 1 tablet (20 mg total) by mouth daily.    Dispense:  90 tablet    Refill:  3   rosuvastatin  (CRESTOR ) 10 MG tablet    Sig: Take 1 tablet (10 mg total) by mouth daily.    Dispense:  90 tablet    Refill:  3   sertraline  (ZOLOFT ) 100 MG tablet    Sig: Take 2 tablets (200 mg total) by mouth daily.    Dispense:  180 tablet    Refill:  1   Patient Instructions  Thank you for coming in today. No change in medications at this time. If there are any concerns on your bloodwork, I will let you know. Take care!     Signed,   Reyes Pines, MD [facility] 03/04/24 9:12 AM

## 2024-03-04 NOTE — Patient Instructions (Signed)
 Thank you for coming in today. No change in medications at this time. If there are any concerns on your bloodwork, I will let you know. Take care!

## 2024-03-05 LAB — HM DIABETES EYE EXAM

## 2024-03-06 ENCOUNTER — Ambulatory Visit: Payer: Self-pay | Admitting: Family Medicine

## 2024-03-06 DIAGNOSIS — E875 Hyperkalemia: Secondary | ICD-10-CM

## 2024-03-09 NOTE — Progress Notes (Signed)
 Called patient and relayed lab results. Scheduled lab visit for tomorrow. Daniel [name] has placed future order for lab

## 2024-03-09 NOTE — Progress Notes (Signed)
 Made note in lab appointment about to have blood drawn without clenching of fist or tourniquet as that may be contributing to the hyperkalemia

## 2024-03-10 ENCOUNTER — Other Ambulatory Visit (INDEPENDENT_AMBULATORY_CARE_PROVIDER_SITE_OTHER)

## 2024-03-10 DIAGNOSIS — E875 Hyperkalemia: Secondary | ICD-10-CM | POA: Diagnosis not present

## 2024-03-10 LAB — BASIC METABOLIC PANEL WITH GFR
BUN: 31 mg/dL — ABNORMAL HIGH (ref 6–23)
CO2: 27 meq/L (ref 19–32)
Calcium: 8.8 mg/dL (ref 8.4–10.5)
Chloride: 106 meq/L (ref 96–112)
Creatinine, Ser: 1.7 mg/dL — ABNORMAL HIGH (ref 0.40–1.50)
GFR: 39.03 mL/min — ABNORMAL LOW (ref >60.00)
Glucose, Bld: 158 mg/dL — ABNORMAL HIGH (ref 70–99)
Potassium: 4.9 meq/L (ref 3.5–5.1)
Sodium: 140 meq/L (ref 135–145)

## 2024-03-11 ENCOUNTER — Ambulatory Visit (INDEPENDENT_AMBULATORY_CARE_PROVIDER_SITE_OTHER): Admitting: Podiatry

## 2024-03-11 DIAGNOSIS — Z91199 Patient's noncompliance with other medical treatment and regimen due to unspecified reason: Secondary | ICD-10-CM

## 2024-03-11 NOTE — Progress Notes (Signed)
 No show

## 2024-03-17 ENCOUNTER — Ambulatory Visit: Payer: Self-pay | Admitting: Family Medicine

## 2024-03-23 ENCOUNTER — Encounter: Admitting: Podiatry

## 2024-03-23 NOTE — Progress Notes (Signed)
 Lab results have been discussed.   Verbalized understanding? Yes  Are there any questions? No

## 2024-04-06 ENCOUNTER — Ambulatory Visit: Admitting: Podiatry

## 2024-04-06 ENCOUNTER — Encounter: Payer: Self-pay | Admitting: Podiatry

## 2024-04-06 DIAGNOSIS — M79674 Pain in right toe(s): Secondary | ICD-10-CM | POA: Diagnosis not present

## 2024-04-06 DIAGNOSIS — B351 Tinea unguium: Secondary | ICD-10-CM | POA: Diagnosis not present

## 2024-04-06 DIAGNOSIS — M79675 Pain in left toe(s): Secondary | ICD-10-CM | POA: Diagnosis not present

## 2024-04-06 DIAGNOSIS — Z89422 Acquired absence of other left toe(s): Secondary | ICD-10-CM | POA: Diagnosis not present

## 2024-04-06 DIAGNOSIS — E1142 Type 2 diabetes mellitus with diabetic polyneuropathy: Secondary | ICD-10-CM

## 2024-04-06 NOTE — Progress Notes (Signed)
 Subjective:  Patient ID: Daniel [name], male    DOB: [birth year],  MRN: [id number]  No chief complaint on file.   DOS: 01/30/24  Procedure: Left third digit amputation  73 y.o. male returns for POV#3. Has missed his last appointment and now 7 weeks out from surgery.   Also concern of thickened elongated and painful nails that are difficult to trim. Requesting to have them trimmed today. Relates burning and tingling in their feet. Patient is diabetic and last A1c was  Lab Results  Component Value Date   HGBA1C 8.9 (H) 01/30/2024   .   PCP:  Levora Reyes SAUNDERS, MD     Review of Systems: Negative except as noted in the HPI. Denies N/V/F/Ch.  Past Medical History:  Diagnosis Date   Allergic rhinitis    Allergy    Anxiety    Arthritis    Benign hypertension    Cataract    Depression    Diabetes mellitus (HCC)    type 2    GERD (gastroesophageal reflux disease)    History of pneumonia    HLD (hyperlipidemia)    Neuromuscular disorder (HCC)    pt denies at 8/19/visit    Peripheral vascular disease    Pneumonia    hx of several times per pt - last episode 4-5 years ago     Current Outpatient Medications:    albuterol  (VENTOLIN  HFA) 108 (90 Base) MCG/ACT inhaler, INHALE 2 PUFFS BY MOUTH EVERY 6 HOURS AS NEEDED FOR WHEEZING OR SHORTNESS OF BREATH, Disp: 9 g, Rfl: 0   aspirin  EC 81 MG tablet, Take 81 mg by mouth at bedtime., Disp: , Rfl:    cabergoline  (DOSTINEX ) 0.5 MG tablet, Take 0.5 mg by mouth 2 (two) times a week., Disp: , Rfl:    Cholecalciferol  (VITAMIN D ) 125 MCG (5000 UT) CAPS, Take 5,000 Units by mouth daily., Disp: , Rfl:    Continuous Glucose Sensor (FREESTYLE LIBRE 3 PLUS SENSOR) MISC, CHANGE EVERY 15 DAYS, Disp: , Rfl:    Cyanocobalamin (VITAMIN B 12 PO), Take 1,000 mcg by mouth daily., Disp: , Rfl:    Difluprednate 0.05 % EMUL, Place 1 drop into the right eye 3 (three) times daily., Disp: , Rfl:    GEMTESA 75 MG TABS, Take 75 mg by mouth at bedtime., Disp: ,  Rfl:    glucose blood test strip, Check glucose twice daily., Disp: , Rfl:    HUMALOG MIX 75/25 KWIKPEN (75-25) 100 UNIT/ML KwikPen, 50-80 Units 2 (two) times daily before a meal., Disp: , Rfl:    Insulin  Pen Needle (PEN NEEDLES) 32G X 4 MM MISC, 1 Application by Does not apply route daily., Disp: 100 each, Rfl: 3   Lancets (ONETOUCH DELICA PLUS LANCET33G) MISC, USE 1 TO CHECK GLUCOSE ONCE DAILY, Disp: , Rfl:    levothyroxine  (SYNTHROID ) 75 MCG tablet, Take 75 mcg by mouth daily before breakfast., Disp: , Rfl:    linaclotide  (LINZESS ) 145 MCG CAPS capsule, Take 1 capsule (145 mcg total) by mouth daily before breakfast., Disp: 12 capsule, Rfl: 0   lisinopril  (ZESTRIL ) 20 MG tablet, Take 1 tablet (20 mg total) by mouth daily., Disp: 90 tablet, Rfl: 3   montelukast  (SINGULAIR ) 10 MG tablet, Take 1 tablet (10 mg total) by mouth at bedtime., Disp: 90 tablet, Rfl: 2   Multiple Vitamin (MULTIVITAMIN WITH MINERALS) TABS tablet, Take 1 tablet by mouth daily. (Patient not taking: Reported on 03/04/2024), Disp: , Rfl:    ONETOUCH ULTRA test strip,  USE 1 STRIP TO CHECK GLUCOSE ONCE DAILY, Disp: 100 each, Rfl: 1   polyethylene glycol (MIRALAX ) 17 g packet, Take 17 g by mouth 2 (two) times daily as needed., Disp: , Rfl:    pregabalin  (LYRICA ) 100 MG capsule, Take 100 mg by mouth 2 (two) times daily. One capsule by mouth twice a day, Disp: , Rfl:    rosuvastatin  (CRESTOR ) 10 MG tablet, Take 1 tablet (10 mg total) by mouth daily., Disp: 90 tablet, Rfl: 3   sertraline  (ZOLOFT ) 100 MG tablet, Take 2 tablets (200 mg total) by mouth daily., Disp: 180 tablet, Rfl: 1   Testosterone  1.62 % GEL, Apply 1 Application topically daily., Disp: , Rfl:    traMADol  (ULTRAM ) 50 MG tablet, Take 1 tablet (50 mg total) by mouth every 6 (six) hours as needed., Disp: 120 tablet, Rfl: 0  Social History   Tobacco Use  Smoking Status Never  Smokeless Tobacco Never    Allergies  Allergen Reactions   Codeine Palpitations, Shortness  Of Breath and Other (See Comments)   Pioglitazone Other (See Comments), Shortness Of Breath and Swelling    Blood sugar went up and down and sweating profusely   Objective:   There were no vitals filed for this visit.  There is no height or weight on file to calculate BMI. Vascular: DP/PT pulses 2/4 bilateral. CFT <3 seconds. Feet cold to touch. Absent hair growth on digits. Edema noted to bilateral lower extremities. Xerosis noted bilaterally.  Skin. No lacerations or abrasions bilateral feet. Nails 1-5 bilateral  are thickened discolored and elongated with subungual debris.  Musculoskeletal: MMT 5/5 bilateral lower extremities in DF, PF, Inversion and Eversion. Deceased ROM in DF of ankle joint.  Neurological: Sensation intact to light touch. Protective sensation diminished bilateral.    Assessment:   1. Status post amputation of lesser toe of left foot   2. Diabetic peripheral neuropathy (HCC)     Plan:  Patient was evaluated and treated and all questions answered.  S/p foot surgery left -Progressing as expected post-operatively. -WB Status: WBAT in regular shoes.  -DM shoes ordered.  -Discussed and educated patient on diabetic foot care, especially with  regards to the vascular, neurological and musculoskeletal systems.  -Stressed the importance of good glycemic control and the detriment of not  controlling glucose levels in relation to the foot. -Discussed supportive shoes at all times and checking feet regularly.  -Mechanically debrided all nails 1-5 bilateral using sterile nail nipper and filed with dremel without incident  -Answered all patient questions -Patient to return  in 3 months for at risk foot care -Patient advised to call the office if any problems or questions arise in the meantime.   No follow-ups on file.

## 2024-04-10 ENCOUNTER — Other Ambulatory Visit: Payer: Self-pay | Admitting: Family Medicine

## 2024-04-10 DIAGNOSIS — G8929 Other chronic pain: Secondary | ICD-10-CM

## 2024-04-10 NOTE — Telephone Encounter (Signed)
 Requested Prescriptions   Pending Prescriptions Disp Refills   traMADol  (ULTRAM ) 50 MG tablet [Pharmacy Med Name: traMADol  HCl 50 MG Oral Tablet] 120 tablet 0    Sig: TAKE 1 TABLET BY MOUTH EVERY 6 HOURS AS NEEDED     Date of patient request: 04/10/24 Last office visit: 03/04/2024 Upcoming visit: 09/02/2024 Date of last refill: 03/04/2024 Last refill amount: 120

## 2024-04-10 NOTE — Telephone Encounter (Signed)
 Chronic knee pain discussed 03/04/2024.  Prior left knee replacement with deferred right knee surgery.  Plan for delaying surgery until after Christmas.  2-3 tramadol  per day on average but did have to use up to 4/day after previous toe surgery.  Continued same regimen.  Controlled substance database reviewed.  Tramadol  120 last filled on 03/04/2024.  Refill ordered.

## 2024-04-13 DIAGNOSIS — E230 Hypopituitarism: Secondary | ICD-10-CM | POA: Diagnosis not present

## 2024-04-14 DIAGNOSIS — D649 Anemia, unspecified: Secondary | ICD-10-CM | POA: Diagnosis not present

## 2024-04-14 DIAGNOSIS — D352 Benign neoplasm of pituitary gland: Secondary | ICD-10-CM | POA: Diagnosis not present

## 2024-04-14 DIAGNOSIS — E291 Testicular hypofunction: Secondary | ICD-10-CM | POA: Diagnosis not present

## 2024-04-14 DIAGNOSIS — E038 Other specified hypothyroidism: Secondary | ICD-10-CM | POA: Diagnosis not present

## 2024-04-14 DIAGNOSIS — E1165 Type 2 diabetes mellitus with hyperglycemia: Secondary | ICD-10-CM | POA: Diagnosis not present

## 2024-04-14 DIAGNOSIS — E7800 Pure hypercholesterolemia, unspecified: Secondary | ICD-10-CM | POA: Diagnosis not present

## 2024-04-14 DIAGNOSIS — I10 Essential (primary) hypertension: Secondary | ICD-10-CM | POA: Diagnosis not present

## 2024-04-14 DIAGNOSIS — N189 Chronic kidney disease, unspecified: Secondary | ICD-10-CM | POA: Diagnosis not present

## 2024-04-14 DIAGNOSIS — E1142 Type 2 diabetes mellitus with diabetic polyneuropathy: Secondary | ICD-10-CM | POA: Diagnosis not present

## 2024-04-20 DIAGNOSIS — E291 Testicular hypofunction: Secondary | ICD-10-CM | POA: Diagnosis not present

## 2024-04-20 DIAGNOSIS — R399 Unspecified symptoms and signs involving the genitourinary system: Secondary | ICD-10-CM | POA: Diagnosis not present

## 2024-04-20 DIAGNOSIS — N3281 Overactive bladder: Secondary | ICD-10-CM | POA: Diagnosis not present

## 2024-04-21 ENCOUNTER — Ambulatory Visit (INDEPENDENT_AMBULATORY_CARE_PROVIDER_SITE_OTHER)

## 2024-04-21 VITALS — BP 135/70 | Ht 65.5 in | Wt 244.0 lb

## 2024-04-21 DIAGNOSIS — Z0000 Encounter for general adult medical examination without abnormal findings: Secondary | ICD-10-CM | POA: Diagnosis not present

## 2024-04-21 NOTE — Progress Notes (Signed)
 Because this visit was a virtual/telehealth visit,  certain criteria was not obtained, such a blood pressure, CBG if applicable, and timed get up and go. Any medications not marked as taking were not mentioned during the medication reconciliation part of the visit. Any vitals not documented were not able to be obtained due to this being a telehealth visit or patient was unable to self-report a recent blood pressure reading due to a lack of equipment at home via telehealth. Vitals that have been documented are verbally provided by the patient.  This visit was performed by a medical professional under my direct supervision. I was immediately available for consultation/collaboration. I have reviewed and agree with the Annual Wellness Visit documentation.  Subjective:   Daniel [name] is a 73 y.o. who presents for a Medicare Wellness preventive visit.  As a reminder, Annual Wellness Visits don't include a physical exam, and some assessments may be limited, especially if this visit is performed virtually. We may recommend an in-person follow-up visit with your provider if needed.  Visit Complete: Virtual I connected with  Daniel [name] on 04/21/24 by a audio enabled telemedicine application and verified that I am speaking with the correct person using two identifiers.  Patient Location: Home  Provider Location: Home Office  I discussed the limitations of evaluation and management by telemedicine. The patient expressed understanding and agreed to proceed.  Vital Signs: Because this visit was a virtual/telehealth visit, some criteria may be missing or patient reported. Any vitals not documented were not able to be obtained and vitals that have been documented are patient reported.  VideoDeclined- This patient declined Librarian, academic. Therefore the visit was completed with audio only.  Persons Participating in Visit: Patient.  AWV Questionnaire: No: Patient Medicare  AWV questionnaire was not completed prior to this visit.  Cardiac Risk Factors include: advanced age (>75men, >33 women);male gender;diabetes mellitus;hypertension;dyslipidemia     Objective:    Today's Vitals   04/21/24 0934 04/21/24 0935  BP: 135/70   Weight: 244 lb (110.7 kg)   Height: 5' 5.5 (1.664 m)   PainSc:  6    Body mass index is 39.99 kg/m.     04/21/2024    9:39 AM 01/30/2024    1:11 AM 01/29/2024    7:03 PM 02/28/2023    8:11 AM 06/18/2022    9:00 AM 02/15/2022    1:34 PM 12/26/2020   10:12 AM  Advanced Directives  Does Patient Have a Medical Advance Directive? Yes  No Yes Yes Yes Yes  Type of Estate agent of [place];Living will   Healthcare Power of Attorney  Living will;Healthcare Power of Attorney Living will  Does patient want to make changes to medical advance directive? No - Patient declined    No - Patient declined    Copy of Healthcare Power of Attorney in Chart? No - copy requested   No - copy requested  No - copy requested   Would patient like information on creating a medical advance directive?  No - Patient declined         Current Medications (verified) Outpatient Encounter Medications as of 04/21/2024  Medication Sig   albuterol  (VENTOLIN  HFA) 108 (90 Base) MCG/ACT inhaler INHALE 2 PUFFS BY MOUTH EVERY 6 HOURS AS NEEDED FOR WHEEZING OR SHORTNESS OF BREATH   aspirin  EC 81 MG tablet Take 81 mg by mouth at bedtime.   cabergoline  (DOSTINEX ) 0.5 MG tablet Take 0.5 mg by mouth 2 (two)  times a week.   Cholecalciferol  (VITAMIN D ) 125 MCG (5000 UT) CAPS Take 5,000 Units by mouth daily.   Continuous Glucose Sensor (FREESTYLE LIBRE 3 PLUS SENSOR) MISC CHANGE EVERY 15 DAYS   Cyanocobalamin (VITAMIN B 12 PO) Take 1,000 mcg by mouth daily.   Difluprednate 0.05 % EMUL Place 1 drop into the [name] eye 3 (three) times daily.   GEMTESA 75 MG TABS Take 75 mg by mouth at bedtime.   glucose blood test strip Check glucose twice daily.   HUMALOG MIX  75/25 KWIKPEN (75-25) 100 UNIT/ML KwikPen 50-80 Units 2 (two) times daily before a meal.   Insulin  Pen Needle (PEN NEEDLES) 32G X 4 MM MISC 1 Application by Does not apply route daily.   Lancets (ONETOUCH DELICA PLUS LANCET33G) MISC USE 1 TO CHECK GLUCOSE ONCE DAILY   levothyroxine  (SYNTHROID ) 75 MCG tablet Take 75 mcg by mouth daily before breakfast.   linaclotide  (LINZESS ) 145 MCG CAPS capsule Take 1 capsule (145 mcg total) by mouth daily before breakfast.   lisinopril  (ZESTRIL ) 20 MG tablet Take 1 tablet (20 mg total) by mouth daily.   montelukast  (SINGULAIR ) 10 MG tablet Take 1 tablet (10 mg total) by mouth at bedtime.   Multiple Vitamin (MULTIVITAMIN WITH MINERALS) TABS tablet Take 1 tablet by mouth daily.   ONETOUCH ULTRA test strip USE 1 STRIP TO CHECK GLUCOSE ONCE DAILY   polyethylene glycol (MIRALAX ) 17 g packet Take 17 g by mouth 2 (two) times daily as needed.   pregabalin  (LYRICA ) 100 MG capsule Take 100 mg by mouth 2 (two) times daily. One capsule by mouth twice a day   rosuvastatin  (CRESTOR ) 10 MG tablet Take 1 tablet (10 mg total) by mouth daily.   sertraline  (ZOLOFT ) 100 MG tablet Take 2 tablets (200 mg total) by mouth daily.   Testosterone  1.62 % GEL Apply 1 Application topically daily.   traMADol  (ULTRAM ) 50 MG tablet TAKE 1 TABLET BY MOUTH EVERY 6 HOURS AS NEEDED   No facility-administered encounter medications on file as of 04/21/2024.    Allergies (verified) Codeine and Pioglitazone   History: Past Medical History:  Diagnosis Date   Allergic rhinitis    Allergy    Anxiety    Arthritis    Benign hypertension    Cataract    Depression    Diabetes mellitus (HCC)    type 2    GERD (gastroesophageal reflux disease)    History of pneumonia    HLD (hyperlipidemia)    Neuromuscular disorder (HCC)    pt denies at 8/19/visit    Peripheral vascular disease    Pneumonia    hx of several times per pt - last episode 4-5 years ago    Past Surgical History:  Procedure  Laterality Date   AMPUTATION TOE Left 01/30/2024   Procedure: AMPUTATION, TOE;  Surgeon: Janit Thresa HERO, DPM;  Location: [facility];  Service: Orthopedics/Podiatry;  Laterality: Left;  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   CARPAL TUNNEL RELEASE Bilateral 2020 [name], 2021 left   CATARACT EXTRACTION     bilateral   EYE SURGERY     KNEE SURGERY     [name]   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   Procedure: LEFT TOTAL KNEE ARTHROPLASTY;  Surgeon: Vernetta Lonni GRADE, MD;  Location: [facility];  Service: Orthopedics;  Laterality: Left;   Family History  Problem Relation Age of Onset   Diabetes Father    Colon polyps Father    Atrial fibrillation Mother  Prostate cancer Paternal Uncle    Cancer Sister    Esophageal cancer Neg Hx    Rectal cancer Neg Hx    Stomach cancer Neg Hx    Social History   Socioeconomic History   Marital status: Married    Spouse name: Not on file   Number of children: 3   Years of education: 16   Highest education level: Bachelor's degree (e.g., BA, AB, BS)  Occupational History   Occupation: Photographer, Optician, dispensing  Tobacco Use   Smoking status: Never   Smokeless tobacco: Never  Vaping Use   Vaping status: Never Used  Substance and Sexual Activity   Alcohol use: No   Drug use: No   Sexual activity: Not Currently  Other Topics Concern   Not on file  Social History Narrative   Caffiene decaff tea and coffee, occ diet soda.   Social Drivers of Corporate investment banker Strain: Low Risk  (04/21/2024)   Overall Financial Resource Strain (CARDIA)    Difficulty of Paying Living Expenses: Not hard at all  Food Insecurity: No Food Insecurity (04/21/2024)   Hunger Vital Sign    Worried About Running Out of Food in the Last Year: Never true    Ran Out of Food in the Last Year: Never true  Transportation Needs: No Transportation Needs (04/21/2024)   PRAPARE - Administrator, Civil Service (Medical): No    Lack of Transportation (Non-Medical): No   Physical Activity: Insufficiently Active (04/21/2024)   Exercise Vital Sign    Days of Exercise per Week: 3 days    Minutes of Exercise per Session: 20 min  Stress: No Stress Concern Present (04/21/2024)   [employer] of Occupational Health - Occupational Stress Questionnaire    Feeling of Stress: Not at all  Social Connections: Socially Integrated (04/21/2024)   Social Connection and Isolation Panel    Frequency of Communication with Friends and Family: More than three times a week    Frequency of Social Gatherings with Friends and Family: Twice a week    Attends Religious Services: More than 4 times per year    Active Member of [employer] or Organizations: Yes    Attends Engineer, structural: More than 4 times per year    Marital Status: Married    Tobacco Counseling Counseling given: Not Answered    Clinical Intake:  Pre-visit preparation completed: Yes  Pain : 0-10 Pain Score: 6  Pain Type: Chronic pain Pain Location: Back Pain Orientation: Lower, Mid Pain Onset: Today Pain Frequency: Several days a week     BMI - recorded: 39.99 Nutritional Status: BMI > 30  Obese Nutritional Risks: None Diabetes: Yes CBG done?: No Did pt. bring in CBG monitor from home?: No  Lab Results  Component Value Date   HGBA1C 8.9 (H) 01/30/2024   HGBA1C 9.7 (H) 12/24/2022   HGBA1C 10.2 (H) 09/20/2022     How often do you need to have someone help you when you read instructions, pamphlets, or other written materials from your doctor or pharmacy?: 1 - Never  Interpreter Needed?: No  Information entered by :: Hasan Douse,CMA   Activities of Daily Living     04/21/2024    9:38 AM 01/30/2024    1:15 AM  In your present state of health, do you have any difficulty performing the following activities:  Hearing? 0 0  Vision? 0 0  Difficulty concentrating or making decisions? 0 0  Walking or climbing  stairs? 0   Dressing or bathing? 0   Doing errands, shopping? 0    Preparing Food and eating ? N   Using the Toilet? N   In the past six months, have you accidently leaked urine? N   Do you have problems with loss of bowel control? N   Managing your Medications? N   Managing your Finances? N   Housekeeping or managing your Housekeeping? N     Patient Care Team: Levora Reyes SAUNDERS, MD as PCP - General (Family Medicine)  I have updated your Care Teams any recent Medical Services you may have received from other providers in the past year.     Assessment:   This is a routine wellness examination for Daniel [name].  Hearing/Vision screen Hearing Screening - Comments:: Wears hearing aids Vision Screening - Comments:: Patient wears glasses   Goals Addressed             This Visit's Progress    Patient Stated       To have knee replaced        Depression Screen     04/21/2024    9:41 AM 03/04/2024    8:47 AM 01/22/2024   10:19 AM 06/19/2023    9:55 AM 03/27/2023    8:31 AM 02/28/2023    8:12 AM 12/24/2022    8:19 AM  PHQ 2/9 Scores  PHQ - 2 Score 0 0 0 0 0 0 0  PHQ- 9 Score 0 0  0 2 2 5     Fall Risk     04/21/2024    9:39 AM 01/22/2024   10:14 AM 06/19/2023    9:55 AM 03/27/2023    8:31 AM 02/28/2023    8:05 AM  Fall Risk   Falls in the past year? 1 1 0 1 1  Number falls in past yr: 1 1 0 1 1  Injury with Fall? 0 0 0 1 1  Risk for fall due to : History of fall(s);Impaired mobility History of fall(s);Impaired balance/gait No Fall Risks    Follow up Falls evaluation completed;Falls prevention discussed Falls evaluation completed Falls evaluation completed Falls evaluation completed Falls evaluation completed;Education provided;Falls prevention discussed    MEDICARE RISK AT HOME:  Medicare Risk at Home Any stairs in or around the home?: Yes If so, are there any without handrails?: No Home free of loose throw rugs in walkways, pet beds, electrical cords, etc?: Yes Adequate lighting in your home to reduce risk of falls?: Yes Life alert?:  No Use of a cane, walker or w/c?: No Grab bars in the bathroom?: Yes Shower chair or bench in shower?: Yes Elevated toilet seat or a handicapped toilet?: Yes  TIMED UP AND GO:  Was the test performed?  No  Cognitive Function: 6CIT completed        04/21/2024    9:37 AM 02/28/2023    8:09 AM 02/15/2022    1:40 PM 01/25/2020   10:19 AM 03/04/2019    9:04 AM  6CIT Screen  What Year? 0 points 0 points 0 points 0 points 0 points  What month? 0 points 0 points 0 points  0 points  What time? 0 points 0 points 0 points 0 points 0 points  Count back from 20 0 points 0 points 0 points 0 points 0 points  Months in reverse 0 points 0 points 0 points 0 points 0 points  Repeat phrase 0 points 0 points 0 points 0 points 0 points  Total  Score 0 points 0 points 0 points  0 points    Immunizations Immunization History  Administered Date(s) Administered   Fluad Quad(high Dose 65+) 02/11/2019, 04/06/2020, 03/09/2022   Fluad Trivalent(High Dose 65+) 03/27/2023   INFLUENZA, HIGH DOSE SEASONAL PF 07/14/2018, 07/14/2018, 03/04/2024   Influenza Split 04/01/2012   Influenza,inj,Quad PF,6+ Mos 05/03/2015, 02/28/2016, 04/08/2017   Influenza,inj,quad, With Preservative 04/01/2017   Influenza-Unspecified 04/01/2021   PFIZER(Purple Top)SARS-COV-2 Vaccination 08/31/2019, 09/21/2019, 05/07/2021   Pneumococcal Conjugate-13 09/13/2016   Pneumococcal Polysaccharide-23 04/05/2005, 07/19/2010, 02/11/2019   Tdap 03/14/2006, 12/31/2017   Zoster Recombinant(Shingrix) 10/29/2016, 01/22/2017   Zoster, Unspecified 05/18/2020, 06/01/2020, 05/23/2022    Screening Tests Health Maintenance  Topic Date Due   COVID-19 Vaccine (4 - 2025-26 season) 03/02/2024   HEMOGLOBIN A1C  08/01/2024   Diabetic kidney evaluation - Urine ACR  11/27/2024   OPHTHALMOLOGY EXAM  03/05/2025   Diabetic kidney evaluation - eGFR measurement  03/10/2025   FOOT EXAM  04/06/2025   Medicare Annual Wellness (AWV)  04/21/2025   DTaP/Tdap/Td  (3 - Td or Tdap) 01/01/2028   Colonoscopy  11/21/2028   Pneumococcal Vaccine: 50+ Years  Completed   Influenza Vaccine  Completed   Hepatitis C Screening  Completed   Zoster Vaccines- Shingrix  Completed   Meningococcal B Vaccine  Aged Out    Health Maintenance Items Addressed:patient declined  Additional Screening:  Vision Screening: Recommended annual ophthalmology exams for early detection of glaucoma and other disorders of the eye. Is the patient up to date with their annual eye exam?  Yes    Dental Screening: Recommended annual dental exams for proper oral hygiene  Community Resource Referral / Chronic Care Management: CRR required this visit?  No   CCM required this visit?  No   Plan:    I have personally reviewed and noted the following in the patient's chart:   Medical and social history Use of alcohol, tobacco or illicit drugs  Current medications and supplements including opioid prescriptions. Patient is not currently taking opioid prescriptions. Functional ability and status Nutritional status Physical activity Advanced directives List of other physicians Hospitalizations, surgeries, and ER visits in previous 12 months Vitals Screenings to include cognitive, depression, and falls Referrals and appointments  In addition, I have reviewed and discussed with patient certain preventive protocols, quality metrics, and best practice recommendations. A written personalized care plan for preventive services as well as general preventive health recommendations were provided to patient.   Daniel [name], [place]   04/21/2024   After Visit Summary: (MyChart) Due to this being a telephonic visit, the after visit summary with patients personalized plan was offered to patient via MyChart   Notes: Nothing significant to report at this time.

## 2024-04-21 NOTE — Patient Instructions (Signed)
 [name],  Thank you for taking the time for your Medicare Wellness Visit. I appreciate your continued commitment to your health goals. Please review the care plan we discussed, and feel free to reach out if I can assist you further.  Medicare recommends these wellness visits once per year to help you and your care team stay ahead of potential health issues. These visits are designed to focus on prevention, allowing your provider to concentrate on managing your acute and chronic conditions during your regular appointments.  Please note that Annual Wellness Visits do not include a physical exam. Some assessments may be limited, especially if the visit was conducted virtually. If needed, we may recommend a separate in-person follow-up with your provider.  Ongoing Care Seeing your primary care provider every 3 to 6 months helps us  monitor your health and provide consistent, personalized care.   Referrals If a referral was made during today's visit and you haven't received any updates within two weeks, please contact the referred provider directly to check on the status.  Recommended Screenings:  Health Maintenance  Topic Date Due   Medicare Annual Wellness Visit  02/28/2024   COVID-19 Vaccine (4 - 2025-26 season) 03/02/2024   Hemoglobin A1C  08/01/2024   Yearly kidney health urinalysis for diabetes  11/27/2024   Eye exam for diabetics  03/05/2025   Yearly kidney function blood test for diabetes  03/10/2025   Complete foot exam   04/06/2025   DTaP/Tdap/Td vaccine (3 - Td or Tdap) 01/01/2028   Colon Cancer Screening  11/21/2028   Pneumococcal Vaccine for age over 22  Completed   Flu Shot  Completed   Hepatitis C Screening  Completed   Zoster (Shingles) Vaccine  Completed   Meningitis B Vaccine  Aged Out       04/21/2024    9:39 AM  Advanced Directives  Does Patient Have a Medical Advance Directive? Yes  Type of Estate agent of [place];Living will  Does  patient want to make changes to medical advance directive? No - Patient declined  Copy of Healthcare Power of Attorney in Chart? No - copy requested   Advance Care Planning is important because it: Ensures you receive medical care that aligns with your values, goals, and preferences. Provides guidance to your family and loved ones, reducing the emotional burden of decision-making during critical moments.  Vision: Annual vision screenings are recommended for early detection of glaucoma, cataracts, and diabetic retinopathy. These exams can also reveal signs of chronic conditions such as diabetes and high blood pressure.  Dental: Annual dental screenings help detect early signs of oral cancer, gum disease, and other conditions linked to overall health, including heart disease and diabetes.  Please see the attached documents for additional preventive care recommendations.

## 2024-04-22 ENCOUNTER — Encounter: Payer: Self-pay | Admitting: Gastroenterology

## 2024-04-22 ENCOUNTER — Other Ambulatory Visit: Payer: Self-pay

## 2024-04-22 MED ORDER — LINACLOTIDE 145 MCG PO CAPS: 145.0000 ug | ORAL_CAPSULE | Freq: Every day | ORAL | 3 refills | Status: AC

## 2024-04-23 DIAGNOSIS — H35371 Puckering of macula, right eye: Secondary | ICD-10-CM | POA: Diagnosis not present

## 2024-04-23 DIAGNOSIS — H26493 Other secondary cataract, bilateral: Secondary | ICD-10-CM | POA: Diagnosis not present

## 2024-04-23 DIAGNOSIS — H26491 Other secondary cataract, right eye: Secondary | ICD-10-CM | POA: Diagnosis not present

## 2024-04-23 DIAGNOSIS — H18413 Arcus senilis, bilateral: Secondary | ICD-10-CM | POA: Diagnosis not present

## 2024-04-23 DIAGNOSIS — H40013 Open angle with borderline findings, low risk, bilateral: Secondary | ICD-10-CM | POA: Diagnosis not present

## 2024-04-29 DIAGNOSIS — Z961 Presence of intraocular lens: Secondary | ICD-10-CM | POA: Diagnosis not present

## 2024-05-14 DIAGNOSIS — H26492 Other secondary cataract, left eye: Secondary | ICD-10-CM | POA: Diagnosis not present

## 2024-05-15 ENCOUNTER — Telehealth: Payer: Self-pay | Admitting: Pharmacist

## 2024-05-15 NOTE — Progress Notes (Signed)
 Pharmacy Quality Measure Review  This patient is appearing on a report for being at risk of failing the adherence measure for hypertension (ACEi/ARB) medications this calendar year.   Medication: lisinopril   Last fill date: 01/14/2024 for 90 day supply  Reviewed recent refill history in Dr Annemarie database. Patient has not filled recently    Called patient. He endorsed that he does need a refill for lisinopril  and that he has been taking 1 tablet daily.  He also asked for refills for sertraline , cabergoline  and levothyroxine  75mcg. Called [facility] and facilitated refills for needed medications. All had refills.  Madelin Ray, PharmD Clinical Pharmacist [facility] Primary Care  Population Health [phone]

## 2024-05-18 ENCOUNTER — Ambulatory Visit: Admitting: Podiatry

## 2024-05-18 ENCOUNTER — Ambulatory Visit (INDEPENDENT_AMBULATORY_CARE_PROVIDER_SITE_OTHER)

## 2024-05-18 ENCOUNTER — Encounter: Payer: Self-pay | Admitting: Podiatry

## 2024-05-18 VITALS — Ht 65.5 in | Wt 244.0 lb

## 2024-05-18 DIAGNOSIS — S90425A Blister (nonthermal), left lesser toe(s), initial encounter: Secondary | ICD-10-CM

## 2024-05-18 DIAGNOSIS — Z89422 Acquired absence of other left toe(s): Secondary | ICD-10-CM

## 2024-05-18 NOTE — Progress Notes (Signed)
 Chief Complaint  Patient presents with   Diabetes    Pt is here due to left foot, states he is a diabetic and has a blister on the foot, he noticed it Saturday, states he has not been checking foot as he should due to moving.    HPI: 73 y.o. male presenting today for emergent work in concerning a blister that developed to the left fifth toe over the last few days.  He has been very busy on his feet recently and he developed a blister that he noticed yesterday  Past Medical History:  Diagnosis Date   Allergic rhinitis    Allergy    Anxiety    Arthritis    Benign hypertension    Cataract    Depression    Diabetes mellitus (HCC)    type 2    GERD (gastroesophageal reflux disease)    History of pneumonia    HLD (hyperlipidemia)    Neuromuscular disorder (HCC)    pt denies at 8/19/visit    Peripheral vascular disease    Pneumonia    hx of several times per pt - last episode 4-5 years ago     Past Surgical History:  Procedure Laterality Date   AMPUTATION TOE Left 01/30/2024   Procedure: AMPUTATION, TOE;  Surgeon: Janit Thresa HERO, DPM;  Location: [facility] OR;  Service: Orthopedics/Podiatry;  Laterality: Left;  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   CARPAL TUNNEL RELEASE Bilateral 2020 Right, 2021 left   CATARACT EXTRACTION     bilateral   EYE SURGERY     KNEE SURGERY     right   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   Procedure: LEFT TOTAL KNEE ARTHROPLASTY;  Surgeon: Vernetta Lonni GRADE, MD;  Location: [facility];  Service: Orthopedics;  Laterality: Left;    Allergies  Allergen Reactions   Codeine Palpitations, Shortness Of Breath and Other (See Comments)   Pioglitazone Other (See Comments), Shortness Of Breath and Swelling    Blood sugar went up and down and sweating profusely    LT foot 05/18/2024  Physical Exam: General: The patient is alert and oriented x3 in no acute distress.  Dermatology: Skin is warm, dry and supple bilateral lower extremities.  Blister noted encompassing  the majority of the left fifth toe.  After debridement there is healthy underlying skin with only partial thickness breakdown.  Clinically no indication of infection  Vascular: Palpable pedal pulses bilaterally. Capillary refill within normal limits.  No appreciable edema.  No erythema.  Neurological: Grossly intact via light touch  Musculoskeletal Exam: History of left third toe amputation  Radiographic Exam LT foot 05/18/2024:  Interval amputation of the left third toe at the level of the MTP.  No erosions concerning for osteomyelitis.  No gas within the tissues  Assessment/Plan of Care: 1.  Blister formation left fifth toe 2.  H/o amputation left third toe.  DOS: 01/30/2024 3.  Diabetes mellitus with peripheral polyneuropathy  -Patient evaluated.  X-rays reviewed -Debridement of the loosely adhered skin was performed today using a tissue nipper down to healthier tissue. -Patient has a postop shoe at home.  WBAT -Patient also has a prescription for mupirocin  ointment.  Recommend applying daily with a Band-Aid -Return to clinic 2 weeks with Dr. Joya     Thresa EMERSON Janit, DPM [facility]  Dr. Thresa EMERSON Janit, DPM    [address]  [place], [place] [address]                Office [phone]  Fax [phone]

## 2024-05-20 ENCOUNTER — Ambulatory Visit: Admitting: Podiatry

## 2024-05-20 DIAGNOSIS — Z961 Presence of intraocular lens: Secondary | ICD-10-CM | POA: Diagnosis not present

## 2024-05-22 ENCOUNTER — Other Ambulatory Visit: Payer: Self-pay | Admitting: Family Medicine

## 2024-05-22 DIAGNOSIS — G8929 Other chronic pain: Secondary | ICD-10-CM

## 2024-05-22 NOTE — Telephone Encounter (Signed)
 Requested Prescriptions   Pending Prescriptions Disp Refills   traMADol  (ULTRAM ) 50 MG tablet [Pharmacy Med Name: traMADol  HCl 50 MG Oral Tablet] 120 tablet 0    Sig: TAKE 1 TABLET BY MOUTH EVERY 6 HOURS AS NEEDED     Date of patient request: 05/22/2024 Last office visit: 03/04/2024 Upcoming visit: 09/02/2024 Date of last refill: 04/10/2024 Last refill amount: 120

## 2024-05-24 NOTE — Telephone Encounter (Signed)
 Medication discussed at his September 3 visit.  Chronic pain in both knees.  Tolerated tramadol  at that time without any new side effects.  Controlled substance database reviewed.  Tramadol  120 last filled on 04/10/2024, previously 120 on September 3.  Refill ordered.

## 2024-06-01 ENCOUNTER — Ambulatory Visit: Admitting: Podiatry

## 2024-06-03 ENCOUNTER — Ambulatory Visit: Admitting: Podiatry

## 2024-06-03 ENCOUNTER — Encounter: Payer: Self-pay | Admitting: Podiatry

## 2024-06-03 DIAGNOSIS — S90425D Blister (nonthermal), left lesser toe(s), subsequent encounter: Secondary | ICD-10-CM

## 2024-06-03 DIAGNOSIS — E1142 Type 2 diabetes mellitus with diabetic polyneuropathy: Secondary | ICD-10-CM | POA: Diagnosis not present

## 2024-06-03 NOTE — Progress Notes (Signed)
 Subjective:  Patient ID: Daniel [name], male    DOB: 1950/07/06,  MRN: [id number]  Chief Complaint  Patient presents with   Diabetic Ulcer    It's doing good.   Saw Dr. Balan - 03/03/2024; A1c - 8.9      73 y.o. male returns  follow-up of blister on his left pinky toe. Relates it is doing well and no new concerns today.  Relates burning and tingling in their feet. Patient is diabetic and last A1c was  Lab Results  Component Value Date   HGBA1C 8.9 (H) 01/30/2024   .   PCP:  Levora Reyes SAUNDERS, MD     Review of Systems: Negative except as noted in the HPI. Denies N/V/F/Ch.  Past Medical History:  Diagnosis Date   Allergic rhinitis    Allergy    Anxiety    Arthritis    Benign hypertension    Cataract    Depression    Diabetes mellitus (HCC)    type 2    GERD (gastroesophageal reflux disease)    History of pneumonia    HLD (hyperlipidemia)    Neuromuscular disorder (HCC)    pt denies at 8/19/visit    Peripheral vascular disease    Pneumonia    hx of several times per pt - last episode 4-5 years ago     Current Outpatient Medications:    albuterol  (VENTOLIN  HFA) 108 (90 Base) MCG/ACT inhaler, INHALE 2 PUFFS BY MOUTH EVERY 6 HOURS AS NEEDED FOR WHEEZING OR SHORTNESS OF BREATH, Disp: 9 g, Rfl: 0   aspirin  EC 81 MG tablet, Take 81 mg by mouth at bedtime., Disp: , Rfl:    cabergoline  (DOSTINEX ) 0.5 MG tablet, Take 0.5 mg by mouth 2 (two) times a week., Disp: , Rfl:    Cholecalciferol  (VITAMIN D ) 125 MCG (5000 UT) CAPS, Take 5,000 Units by mouth daily., Disp: , Rfl:    Continuous Glucose Sensor (FREESTYLE LIBRE 3 PLUS SENSOR) MISC, CHANGE EVERY 15 DAYS, Disp: , Rfl:    Cyanocobalamin (VITAMIN B 12 PO), Take 1,000 mcg by mouth daily., Disp: , Rfl:    Difluprednate 0.05 % EMUL, Place 1 drop into the right eye 3 (three) times daily., Disp: , Rfl:    GEMTESA 75 MG TABS, Take 75 mg by mouth at bedtime., Disp: , Rfl:    glucose blood test strip, Check glucose twice daily.,  Disp: , Rfl:    HUMALOG MIX 75/25 KWIKPEN (75-25) 100 UNIT/ML KwikPen, 50-80 Units 2 (two) times daily before a meal., Disp: , Rfl:    Insulin  Pen Needle (PEN NEEDLES) 32G X 4 MM MISC, 1 Application by Does not apply route daily., Disp: 100 each, Rfl: 3   Lancets (ONETOUCH DELICA PLUS LANCET33G) MISC, USE 1 TO CHECK GLUCOSE ONCE DAILY, Disp: , Rfl:    levothyroxine  (SYNTHROID ) 75 MCG tablet, Take 75 mcg by mouth daily before breakfast., Disp: , Rfl:    linaclotide  (LINZESS ) 145 MCG CAPS capsule, Take 1 capsule (145 mcg total) by mouth daily before breakfast., Disp: 30 capsule, Rfl: 3   lisinopril  (ZESTRIL ) 20 MG tablet, Take 1 tablet (20 mg total) by mouth daily., Disp: 90 tablet, Rfl: 3   montelukast  (SINGULAIR ) 10 MG tablet, Take 1 tablet (10 mg total) by mouth at bedtime., Disp: 90 tablet, Rfl: 2   Multiple Vitamin (MULTIVITAMIN WITH MINERALS) TABS tablet, Take 1 tablet by mouth daily., Disp: , Rfl:    ONETOUCH ULTRA test strip, USE 1 STRIP TO CHECK GLUCOSE  ONCE DAILY, Disp: 100 each, Rfl: 1   polyethylene glycol (MIRALAX ) 17 g packet, Take 17 g by mouth 2 (two) times daily as needed., Disp: , Rfl:    pregabalin  (LYRICA ) 100 MG capsule, Take 100 mg by mouth 2 (two) times daily. One capsule by mouth twice a day, Disp: , Rfl:    rosuvastatin  (CRESTOR ) 10 MG tablet, Take 1 tablet (10 mg total) by mouth daily., Disp: 90 tablet, Rfl: 3   sertraline  (ZOLOFT ) 100 MG tablet, Take 2 tablets (200 mg total) by mouth daily., Disp: 180 tablet, Rfl: 1   Testosterone  1.62 % GEL, Apply 1 Application topically daily., Disp: , Rfl:    traMADol  (ULTRAM ) 50 MG tablet, TAKE 1 TABLET BY MOUTH EVERY 6 HOURS AS NEEDED, Disp: 120 tablet, Rfl: 0  Social History   Tobacco Use  Smoking Status Never  Smokeless Tobacco Never    Allergies  Allergen Reactions   Codeine Palpitations, Shortness Of Breath and Other (See Comments)   Pioglitazone Other (See Comments), Shortness Of Breath and Swelling    Blood sugar went  up and down and sweating profusely   Objective:   There were no vitals filed for this visit.  There is no height or weight on file to calculate BMI. Vascular: DP/PT pulses 2/4 bilateral. CFT <3 seconds. Feet cold to touch. Absent hair growth on digits. Edema noted to bilateral lower extremities. Xerosis noted bilaterally.  Skin. No lacerations or abrasions bilateral feet. Nails 1-5 bilateral  are thickened discolored and elongated with subungual debris. Fifth digit blister well healed. No open ulceration noted.  Musculoskeletal: MMT 5/5 bilateral lower extremities in DF, PF, Inversion and Eversion. Deceased ROM in DF of ankle joint.  Neurological: Sensation intact to light touch. Protective sensation diminished bilateral.    Assessment:   1. Blister of fifth toe of left foot, subsequent encounter   2. Diabetic peripheral neuropathy (HCC)      Plan:  Patient was evaluated and treated and all questions answered.  -Discussed and educated patient on diabetic foot care, especially with  regards to the vascular, neurological and musculoskeletal systems.  -Stressed the importance of good glycemic control and the detriment of not  controlling glucose levels in relation to the foot. -Discussed supportive shoes at all times and checking feet regularly.  -Blister on left fifth toe well healed. May discontinue dressing.   -Answered all patient questions -Patient to return  in 1 months for at risk foot care -Patient advised to call the office if any problems or questions arise in the meantime.   No follow-ups on file.

## 2024-07-06 ENCOUNTER — Encounter: Payer: Self-pay | Admitting: Podiatry

## 2024-07-06 ENCOUNTER — Ambulatory Visit (INDEPENDENT_AMBULATORY_CARE_PROVIDER_SITE_OTHER): Admitting: Podiatry

## 2024-07-06 DIAGNOSIS — E1142 Type 2 diabetes mellitus with diabetic polyneuropathy: Secondary | ICD-10-CM | POA: Diagnosis not present

## 2024-07-06 DIAGNOSIS — M79674 Pain in right toe(s): Secondary | ICD-10-CM

## 2024-07-06 DIAGNOSIS — M79675 Pain in left toe(s): Secondary | ICD-10-CM | POA: Diagnosis not present

## 2024-07-06 DIAGNOSIS — B351 Tinea unguium: Secondary | ICD-10-CM | POA: Diagnosis not present

## 2024-07-06 DIAGNOSIS — G8929 Other chronic pain: Secondary | ICD-10-CM

## 2024-07-06 NOTE — Progress Notes (Signed)
 This patient returns to my office for at risk foot care.  This patient requires this care by a professional since this patient will be at risk due to having type 2 diabetes,CKD and amputation third toe left foot. This patient is unable to cut nails himself since the patient cannot reach his nails.These nails are painful walking and wearing shoes.  This patient presents for at risk foot care today.  General Appearance  Alert, conversant and in no acute stress.  Vascular  Dorsalis pedis and posterior tibial  pulses are palpable  bilaterally.  Capillary return is within normal limits  bilaterally. Temperature is within normal limits  bilaterally.  Neurologic  Senn-Weinstein monofilament wire test diminished bilaterally. Muscle power within normal limits bilaterally.  Nails Thick disfigured discolored nails with subungual debris  from hallux to fifth toes right and 12 left and 45 left. No evidence of bacterial infection or drainage bilaterally.  Orthopedic  No limitations of motion  feet .  No crepitus or effusions noted. Amputation third toe left.  Hammer toe second toe left.  Skin  normotropic skin with no porokeratosis noted bilaterally.  No signs of infections or ulcers noted.     Onychomycosis  Pain in right toes  Pain in left toes  Consent was obtained for treatment procedures.   Mechanical debridement of nails 1-5  bilaterally performed with a nail nipper.  Filed with dremel without incident.    Return office visit    3 months                  Told patient to return for periodic foot care and evaluation due to potential at risk complications.   Cordella Bold DPM

## 2024-07-06 NOTE — Telephone Encounter (Signed)
 Chronic knee pain discussed at his March 04, 2024 visit.  Continued tramadol  in good faith until he was able to proceed with surgery.  Controlled substance database reviewed.  Tramadol  No. 120 last filled on 05/25/2024, previously 04/10/2024.  Refill ordered.

## 2024-07-06 NOTE — Telephone Encounter (Signed)
 Requested Prescriptions   Pending Prescriptions Disp Refills   traMADol  (ULTRAM ) 50 MG tablet [Pharmacy Med Name: traMADol  HCl 50 MG Oral Tablet] 120 tablet 0    Sig: TAKE 1 TABLET BY MOUTH EVERY 6 HOURS AS NEEDED     Date of patient request: 07/06/24 Last office visit: 03/04/2024 Upcoming visit: 09/02/2024 Date of last refill: 05/24/24 Last refill amount: 120

## 2024-07-27 ENCOUNTER — Ambulatory Visit: Admitting: Physician Assistant

## 2024-07-31 ENCOUNTER — Encounter: Payer: Self-pay | Admitting: Radiology

## 2024-08-03 ENCOUNTER — Ambulatory Visit: Admitting: Physician Assistant

## 2024-09-02 ENCOUNTER — Encounter: Admitting: Family Medicine

## 2024-10-05 ENCOUNTER — Ambulatory Visit: Admitting: Podiatry
